# Patient Record
Sex: Female | Born: 1950 | Race: White | Hispanic: No | Marital: Married | Smoking: Never smoker
Health system: Southern US, Community
[De-identification: ages and names within clinical notes are randomized; demographics above are authoritative.]

## PROBLEM LIST (undated history)

## (undated) DIAGNOSIS — I10 Essential (primary) hypertension: Secondary | ICD-10-CM

## (undated) DIAGNOSIS — I2510 Atherosclerotic heart disease of native coronary artery without angina pectoris: Secondary | ICD-10-CM

## (undated) DIAGNOSIS — E782 Mixed hyperlipidemia: Secondary | ICD-10-CM

## (undated) DIAGNOSIS — J84112 Idiopathic pulmonary fibrosis: Secondary | ICD-10-CM

## (undated) DIAGNOSIS — M797 Fibromyalgia: Secondary | ICD-10-CM

## (undated) DIAGNOSIS — E119 Type 2 diabetes mellitus without complications: Secondary | ICD-10-CM

## (undated) DIAGNOSIS — M810 Age-related osteoporosis without current pathological fracture: Secondary | ICD-10-CM

## (undated) DIAGNOSIS — E7800 Pure hypercholesterolemia, unspecified: Secondary | ICD-10-CM

## (undated) HISTORY — DX: Essential (primary) hypertension: I10

## (undated) HISTORY — DX: Type 2 diabetes mellitus without complications: E11.9

## (undated) HISTORY — PX: CATARACT EXTRACTION: SUR2

## (undated) HISTORY — DX: Age-related osteoporosis without current pathological fracture: M81.0

## (undated) HISTORY — DX: Mixed hyperlipidemia: E78.2

## (undated) HISTORY — PX: ABDOMINAL HYSTERECTOMY: SHX81

## (undated) HISTORY — DX: Pure hypercholesterolemia, unspecified: E78.00

## (undated) HISTORY — DX: Fibromyalgia: M79.7

## (undated) HISTORY — DX: Atherosclerotic heart disease of native coronary artery without angina pectoris: I25.10

## (undated) HISTORY — DX: Idiopathic pulmonary fibrosis: J84.112

---

## 1999-04-04 ENCOUNTER — Encounter: Payer: Self-pay | Admitting: Endocrinology

## 2004-05-30 ENCOUNTER — Ambulatory Visit: Payer: Self-pay | Admitting: Cardiology

## 2004-06-07 ENCOUNTER — Ambulatory Visit: Payer: Self-pay | Admitting: Cardiology

## 2005-03-20 HISTORY — PX: ABDOMINAL HYSTERECTOMY: SHX81

## 2007-06-14 ENCOUNTER — Ambulatory Visit: Payer: Self-pay | Admitting: Cardiology

## 2007-12-18 ENCOUNTER — Encounter: Payer: Self-pay | Admitting: Endocrinology

## 2007-12-25 ENCOUNTER — Encounter: Payer: Self-pay | Admitting: Endocrinology

## 2008-01-03 ENCOUNTER — Ambulatory Visit: Payer: Self-pay | Admitting: Endocrinology

## 2008-01-03 DIAGNOSIS — E785 Hyperlipidemia, unspecified: Secondary | ICD-10-CM | POA: Insufficient documentation

## 2008-01-03 DIAGNOSIS — M1380 Other specified arthritis, unspecified site: Secondary | ICD-10-CM | POA: Insufficient documentation

## 2008-01-03 DIAGNOSIS — F329 Major depressive disorder, single episode, unspecified: Secondary | ICD-10-CM | POA: Insufficient documentation

## 2008-01-03 DIAGNOSIS — G43909 Migraine, unspecified, not intractable, without status migrainosus: Secondary | ICD-10-CM | POA: Insufficient documentation

## 2008-01-03 DIAGNOSIS — L659 Nonscarring hair loss, unspecified: Secondary | ICD-10-CM | POA: Insufficient documentation

## 2008-01-03 DIAGNOSIS — E041 Nontoxic single thyroid nodule: Secondary | ICD-10-CM | POA: Insufficient documentation

## 2008-01-03 DIAGNOSIS — J309 Allergic rhinitis, unspecified: Secondary | ICD-10-CM | POA: Insufficient documentation

## 2008-01-03 DIAGNOSIS — F3289 Other specified depressive episodes: Secondary | ICD-10-CM | POA: Insufficient documentation

## 2008-01-17 ENCOUNTER — Encounter: Payer: Self-pay | Admitting: Endocrinology

## 2008-01-23 ENCOUNTER — Telehealth: Payer: Self-pay | Admitting: Endocrinology

## 2008-03-19 ENCOUNTER — Encounter: Payer: Self-pay | Admitting: Endocrinology

## 2008-09-03 ENCOUNTER — Encounter: Payer: Self-pay | Admitting: Endocrinology

## 2008-09-17 ENCOUNTER — Ambulatory Visit: Payer: Self-pay | Admitting: Endocrinology

## 2008-09-17 DIAGNOSIS — E236 Other disorders of pituitary gland: Secondary | ICD-10-CM | POA: Insufficient documentation

## 2008-09-17 LAB — CONVERTED CEMR LAB
FSH: 61 milliintl units/mL
TSH: 0.55 microintl units/mL (ref 0.35–5.50)

## 2009-10-07 ENCOUNTER — Ambulatory Visit: Payer: Self-pay | Admitting: Cardiology

## 2014-04-30 DIAGNOSIS — Z6829 Body mass index (BMI) 29.0-29.9, adult: Secondary | ICD-10-CM | POA: Diagnosis not present

## 2014-04-30 DIAGNOSIS — M8588 Other specified disorders of bone density and structure, other site: Secondary | ICD-10-CM | POA: Diagnosis not present

## 2014-04-30 DIAGNOSIS — Z124 Encounter for screening for malignant neoplasm of cervix: Secondary | ICD-10-CM | POA: Diagnosis not present

## 2014-04-30 DIAGNOSIS — Z01419 Encounter for gynecological examination (general) (routine) without abnormal findings: Secondary | ICD-10-CM | POA: Diagnosis not present

## 2014-04-30 DIAGNOSIS — N958 Other specified menopausal and perimenopausal disorders: Secondary | ICD-10-CM | POA: Diagnosis not present

## 2014-04-30 DIAGNOSIS — Z1231 Encounter for screening mammogram for malignant neoplasm of breast: Secondary | ICD-10-CM | POA: Diagnosis not present

## 2014-05-20 DIAGNOSIS — M542 Cervicalgia: Secondary | ICD-10-CM | POA: Diagnosis not present

## 2014-05-20 DIAGNOSIS — M797 Fibromyalgia: Secondary | ICD-10-CM | POA: Diagnosis not present

## 2014-05-20 DIAGNOSIS — R5383 Other fatigue: Secondary | ICD-10-CM | POA: Diagnosis not present

## 2014-05-20 DIAGNOSIS — G4701 Insomnia due to medical condition: Secondary | ICD-10-CM | POA: Diagnosis not present

## 2014-06-11 DIAGNOSIS — E780 Pure hypercholesterolemia: Secondary | ICD-10-CM | POA: Diagnosis not present

## 2014-06-11 DIAGNOSIS — G5761 Lesion of plantar nerve, right lower limb: Secondary | ICD-10-CM | POA: Diagnosis not present

## 2014-06-11 DIAGNOSIS — Z1389 Encounter for screening for other disorder: Secondary | ICD-10-CM | POA: Diagnosis not present

## 2014-06-11 DIAGNOSIS — M797 Fibromyalgia: Secondary | ICD-10-CM | POA: Diagnosis not present

## 2014-09-30 DIAGNOSIS — D3130 Benign neoplasm of unspecified choroid: Secondary | ICD-10-CM | POA: Diagnosis not present

## 2014-10-19 DIAGNOSIS — H40013 Open angle with borderline findings, low risk, bilateral: Secondary | ICD-10-CM | POA: Diagnosis not present

## 2014-10-19 DIAGNOSIS — H2511 Age-related nuclear cataract, right eye: Secondary | ICD-10-CM | POA: Diagnosis not present

## 2014-10-19 DIAGNOSIS — H2512 Age-related nuclear cataract, left eye: Secondary | ICD-10-CM | POA: Diagnosis not present

## 2014-10-19 DIAGNOSIS — H25011 Cortical age-related cataract, right eye: Secondary | ICD-10-CM | POA: Diagnosis not present

## 2014-10-19 DIAGNOSIS — H25012 Cortical age-related cataract, left eye: Secondary | ICD-10-CM | POA: Diagnosis not present

## 2014-10-22 DIAGNOSIS — Z79899 Other long term (current) drug therapy: Secondary | ICD-10-CM | POA: Diagnosis not present

## 2014-10-27 DIAGNOSIS — H2511 Age-related nuclear cataract, right eye: Secondary | ICD-10-CM | POA: Diagnosis not present

## 2014-11-04 DIAGNOSIS — Z5181 Encounter for therapeutic drug level monitoring: Secondary | ICD-10-CM | POA: Diagnosis not present

## 2014-11-04 DIAGNOSIS — E780 Pure hypercholesterolemia: Secondary | ICD-10-CM | POA: Diagnosis not present

## 2014-11-04 DIAGNOSIS — R739 Hyperglycemia, unspecified: Secondary | ICD-10-CM | POA: Diagnosis not present

## 2014-11-12 DIAGNOSIS — M4000 Postural kyphosis, site unspecified: Secondary | ICD-10-CM | POA: Diagnosis not present

## 2014-11-12 DIAGNOSIS — Z23 Encounter for immunization: Secondary | ICD-10-CM | POA: Diagnosis not present

## 2014-11-12 DIAGNOSIS — Z0001 Encounter for general adult medical examination with abnormal findings: Secondary | ICD-10-CM | POA: Diagnosis not present

## 2014-11-13 DIAGNOSIS — Z036 Encounter for observation for suspected toxic effect from ingested substance ruled out: Secondary | ICD-10-CM | POA: Diagnosis not present

## 2014-11-13 DIAGNOSIS — M797 Fibromyalgia: Secondary | ICD-10-CM | POA: Diagnosis not present

## 2014-11-13 DIAGNOSIS — G4700 Insomnia, unspecified: Secondary | ICD-10-CM | POA: Diagnosis not present

## 2014-11-13 DIAGNOSIS — M40204 Unspecified kyphosis, thoracic region: Secondary | ICD-10-CM | POA: Diagnosis not present

## 2014-11-13 DIAGNOSIS — M5137 Other intervertebral disc degeneration, lumbosacral region: Secondary | ICD-10-CM | POA: Diagnosis not present

## 2014-11-18 DIAGNOSIS — H2512 Age-related nuclear cataract, left eye: Secondary | ICD-10-CM | POA: Diagnosis not present

## 2014-11-18 DIAGNOSIS — H25012 Cortical age-related cataract, left eye: Secondary | ICD-10-CM | POA: Diagnosis not present

## 2014-11-24 DIAGNOSIS — H2513 Age-related nuclear cataract, bilateral: Secondary | ICD-10-CM | POA: Diagnosis not present

## 2014-11-24 DIAGNOSIS — H2512 Age-related nuclear cataract, left eye: Secondary | ICD-10-CM | POA: Diagnosis not present

## 2014-12-16 DIAGNOSIS — Z961 Presence of intraocular lens: Secondary | ICD-10-CM | POA: Diagnosis not present

## 2014-12-26 DIAGNOSIS — R6889 Other general symptoms and signs: Secondary | ICD-10-CM | POA: Diagnosis not present

## 2015-01-01 DIAGNOSIS — R079 Chest pain, unspecified: Secondary | ICD-10-CM | POA: Diagnosis not present

## 2015-01-05 DIAGNOSIS — R079 Chest pain, unspecified: Secondary | ICD-10-CM | POA: Diagnosis not present

## 2015-01-05 DIAGNOSIS — R9431 Abnormal electrocardiogram [ECG] [EKG]: Secondary | ICD-10-CM | POA: Diagnosis not present

## 2015-03-23 DIAGNOSIS — J111 Influenza due to unidentified influenza virus with other respiratory manifestations: Secondary | ICD-10-CM | POA: Diagnosis not present

## 2015-05-17 DIAGNOSIS — Z79899 Other long term (current) drug therapy: Secondary | ICD-10-CM | POA: Diagnosis not present

## 2015-05-17 DIAGNOSIS — M542 Cervicalgia: Secondary | ICD-10-CM | POA: Diagnosis not present

## 2015-05-17 DIAGNOSIS — M5137 Other intervertebral disc degeneration, lumbosacral region: Secondary | ICD-10-CM | POA: Diagnosis not present

## 2015-05-17 DIAGNOSIS — G4709 Other insomnia: Secondary | ICD-10-CM | POA: Diagnosis not present

## 2015-05-20 DIAGNOSIS — Z961 Presence of intraocular lens: Secondary | ICD-10-CM | POA: Diagnosis not present

## 2015-09-01 DIAGNOSIS — H40013 Open angle with borderline findings, low risk, bilateral: Secondary | ICD-10-CM | POA: Diagnosis not present

## 2015-09-01 DIAGNOSIS — Z961 Presence of intraocular lens: Secondary | ICD-10-CM | POA: Diagnosis not present

## 2015-09-01 DIAGNOSIS — D3131 Benign neoplasm of right choroid: Secondary | ICD-10-CM | POA: Diagnosis not present

## 2015-09-01 DIAGNOSIS — H524 Presbyopia: Secondary | ICD-10-CM | POA: Diagnosis not present

## 2015-11-12 DIAGNOSIS — Z5181 Encounter for therapeutic drug level monitoring: Secondary | ICD-10-CM | POA: Diagnosis not present

## 2015-11-19 DIAGNOSIS — M1711 Unilateral primary osteoarthritis, right knee: Secondary | ICD-10-CM | POA: Diagnosis not present

## 2015-11-19 DIAGNOSIS — M79672 Pain in left foot: Secondary | ICD-10-CM | POA: Diagnosis not present

## 2015-11-19 DIAGNOSIS — G4709 Other insomnia: Secondary | ICD-10-CM | POA: Diagnosis not present

## 2015-11-19 DIAGNOSIS — M797 Fibromyalgia: Secondary | ICD-10-CM | POA: Diagnosis not present

## 2015-11-19 DIAGNOSIS — M1712 Unilateral primary osteoarthritis, left knee: Secondary | ICD-10-CM | POA: Diagnosis not present

## 2015-11-19 DIAGNOSIS — M79671 Pain in right foot: Secondary | ICD-10-CM | POA: Diagnosis not present

## 2015-12-10 DIAGNOSIS — Z5181 Encounter for therapeutic drug level monitoring: Secondary | ICD-10-CM | POA: Diagnosis not present

## 2015-12-10 DIAGNOSIS — R739 Hyperglycemia, unspecified: Secondary | ICD-10-CM | POA: Diagnosis not present

## 2015-12-10 DIAGNOSIS — E7800 Pure hypercholesterolemia, unspecified: Secondary | ICD-10-CM | POA: Diagnosis not present

## 2015-12-10 DIAGNOSIS — M797 Fibromyalgia: Secondary | ICD-10-CM | POA: Diagnosis not present

## 2015-12-16 DIAGNOSIS — Z23 Encounter for immunization: Secondary | ICD-10-CM | POA: Diagnosis not present

## 2015-12-16 DIAGNOSIS — Z0000 Encounter for general adult medical examination without abnormal findings: Secondary | ICD-10-CM | POA: Diagnosis not present

## 2015-12-16 DIAGNOSIS — Z1389 Encounter for screening for other disorder: Secondary | ICD-10-CM | POA: Diagnosis not present

## 2016-01-21 ENCOUNTER — Other Ambulatory Visit: Payer: Self-pay | Admitting: Rheumatology

## 2016-01-21 ENCOUNTER — Telehealth: Payer: Self-pay | Admitting: Radiology

## 2016-01-21 NOTE — Telephone Encounter (Signed)
Patient states she will call back Monday 01/24/16 to schedule March appt.

## 2016-01-21 NOTE — Telephone Encounter (Signed)
Last visit 11/19/15 Next visit not scheduled, have sent message for scheduling  Ok to refill per Dr Estanislado Pandy

## 2016-01-21 NOTE — Telephone Encounter (Signed)
Patient needs follow up appt with Dr Estanislado Pandy pls call to make appt. March

## 2016-02-14 ENCOUNTER — Other Ambulatory Visit: Payer: Self-pay | Admitting: Rheumatology

## 2016-02-14 MED ORDER — TRAMADOL HCL 50 MG PO TABS: 50.0000 mg | ORAL_TABLET | Freq: Three times a day (TID) | ORAL | 2 refills | Status: DC | PRN

## 2016-02-14 NOTE — Telephone Encounter (Signed)
11/19/15 last visit Next visit 06/01/16 UDS 11/12/15 Ok to refill Tramadol ?

## 2016-02-14 NOTE — Telephone Encounter (Signed)
Patient is requesting refill of Tramadol be sent to [facility] today because she is completely out.

## 2016-02-15 ENCOUNTER — Telehealth: Payer: Self-pay | Admitting: Rheumatology

## 2016-02-15 NOTE — Telephone Encounter (Signed)
Patient called to check status of Tramadol refill. Patient is very upset since she is completely out

## 2016-02-15 NOTE — Telephone Encounter (Signed)
She requested it yesterday, I called in today

## 2016-05-15 ENCOUNTER — Telehealth: Payer: Self-pay | Admitting: Radiology

## 2016-05-15 NOTE — Telephone Encounter (Signed)
[employer] has sent [place] a safety notification regarding patient. She uses Tramadol from [place] and uses Savella from Dr Nadara Mustard. 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 I have called patient to advise her to decrease her use of tramadol, and to try to stop it if possible. She voiced understanding   To you FYI

## 2016-06-01 ENCOUNTER — Ambulatory Visit: Payer: Self-pay | Admitting: Rheumatology

## 2016-06-12 ENCOUNTER — Other Ambulatory Visit: Payer: Self-pay | Admitting: Obstetrics and Gynecology

## 2016-06-12 DIAGNOSIS — M8588 Other specified disorders of bone density and structure, other site: Secondary | ICD-10-CM | POA: Diagnosis not present

## 2016-06-12 DIAGNOSIS — Z01419 Encounter for gynecological examination (general) (routine) without abnormal findings: Secondary | ICD-10-CM | POA: Diagnosis not present

## 2016-06-12 DIAGNOSIS — N958 Other specified menopausal and perimenopausal disorders: Secondary | ICD-10-CM | POA: Diagnosis not present

## 2016-06-12 DIAGNOSIS — Z13820 Encounter for screening for osteoporosis: Secondary | ICD-10-CM | POA: Diagnosis not present

## 2016-06-12 DIAGNOSIS — N644 Mastodynia: Secondary | ICD-10-CM

## 2016-06-14 ENCOUNTER — Ambulatory Visit
Admission: RE | Admit: 2016-06-14 | Discharge: 2016-06-14 | Disposition: A | Discharge status: Home / Self Care | Payer: Medicare Other | Source: Ambulatory Visit | Attending: Obstetrics and Gynecology | Admitting: Obstetrics and Gynecology

## 2016-06-14 ENCOUNTER — Encounter: Payer: Self-pay | Admitting: Radiology

## 2016-06-14 DIAGNOSIS — N644 Mastodynia: Secondary | ICD-10-CM

## 2016-06-23 ENCOUNTER — Ambulatory Visit: Payer: Self-pay | Admitting: Rheumatology

## 2016-06-26 DIAGNOSIS — M79672 Pain in left foot: Secondary | ICD-10-CM

## 2016-06-26 DIAGNOSIS — M25562 Pain in left knee: Secondary | ICD-10-CM

## 2016-06-26 DIAGNOSIS — M19071 Primary osteoarthritis, right ankle and foot: Secondary | ICD-10-CM | POA: Insufficient documentation

## 2016-06-26 DIAGNOSIS — M5134 Other intervertebral disc degeneration, thoracic region: Secondary | ICD-10-CM | POA: Insufficient documentation

## 2016-06-26 DIAGNOSIS — M79671 Pain in right foot: Secondary | ICD-10-CM | POA: Insufficient documentation

## 2016-06-26 DIAGNOSIS — G4739 Other sleep apnea: Secondary | ICD-10-CM | POA: Insufficient documentation

## 2016-06-26 DIAGNOSIS — Z8739 Personal history of other diseases of the musculoskeletal system and connective tissue: Secondary | ICD-10-CM | POA: Insufficient documentation

## 2016-06-26 DIAGNOSIS — R5383 Other fatigue: Secondary | ICD-10-CM | POA: Insufficient documentation

## 2016-06-26 DIAGNOSIS — M170 Bilateral primary osteoarthritis of knee: Secondary | ICD-10-CM | POA: Insufficient documentation

## 2016-06-26 DIAGNOSIS — M5030 Other cervical disc degeneration, unspecified cervical region: Secondary | ICD-10-CM | POA: Insufficient documentation

## 2016-06-26 DIAGNOSIS — M25561 Pain in right knee: Secondary | ICD-10-CM | POA: Insufficient documentation

## 2016-06-26 DIAGNOSIS — Z8639 Personal history of other endocrine, nutritional and metabolic disease: Secondary | ICD-10-CM | POA: Insufficient documentation

## 2016-06-26 DIAGNOSIS — Z8679 Personal history of other diseases of the circulatory system: Secondary | ICD-10-CM | POA: Insufficient documentation

## 2016-06-26 DIAGNOSIS — M797 Fibromyalgia: Secondary | ICD-10-CM | POA: Insufficient documentation

## 2016-06-26 DIAGNOSIS — M19072 Primary osteoarthritis, left ankle and foot: Secondary | ICD-10-CM

## 2016-06-26 DIAGNOSIS — M5136 Other intervertebral disc degeneration, lumbar region: Secondary | ICD-10-CM | POA: Insufficient documentation

## 2016-06-26 DIAGNOSIS — G4709 Other insomnia: Secondary | ICD-10-CM | POA: Insufficient documentation

## 2016-06-26 NOTE — Progress Notes (Signed)
Office Visit Note  Patient: Nichole Reyes             Date of Birth: 03-Jan-1951           MRN: [id number]             PCP: Rory Percy, MD Referring: Renato Shin, MD Visit Date: 06/28/2016 Occupation: @GUAROCC @    Subjective:  Follow-up   History of Present Illness: Nichole Reyes is a 66 y.o. female   She rates her FMS as a "4-5 "on a scale of 0-10.  Patient also is having trapezius muscle pain and rates her discomfort as a 9 on a scale of 0-10.  Currently, patient's bilateral knees are doing well.  Patient also has worked very hard to decrease the tramadol usage. She is making an effort to discontinue her tramadol over time. Currently she does need a refill and I'm happy to do so. I've asked the patient to try to take tramadol only at nighttime on days that she cannot tolerate the pain. We discussed the addictive nature of these type of drugs and how to minimize risk of addiction  Patient's labs are past 2 and we will order labs today for the patient (CBC with differential, CMP with GFR, vitamin D 25 OH)  Also, patient does not have a bedtime muscle relaxer and I have offered her Zanaflex to use. She is agreeable. She will do 4 mg daily at bedtime when necessary.  Patient brought in her bone density as requested to discuss in detail. Future bone densities, patient wants [place] to start ordering and treat accordingly.  Activities of Daily Living:  Patient reports morning stiffness for 15 minutes.   Patient Reports nocturnal pain.  Difficulty dressing/grooming: Reports Difficulty climbing stairs: Denies Difficulty getting out of chair: Denies Difficulty using hands for taps, buttons, cutlery, and/or writing: Denies   Review of Systems  Constitutional: Positive for fatigue.  HENT: Negative for mouth sores and mouth dryness.   Eyes: Negative for dryness.  Respiratory: Negative for shortness of breath.   Gastrointestinal: Negative for constipation and diarrhea.   Musculoskeletal: Positive for myalgias and myalgias.  Skin: Negative for sensitivity to sunlight.  Psychiatric/Behavioral: Positive for sleep disturbance. Negative for decreased concentration.    PMFS History:  Patient Active Problem List   Diagnosis Date Noted  . Fibromyalgia syndrome 06/26/2016  . Other fatigue 06/26/2016  . Other insomnia 06/26/2016  . Arthralgia of both knees 06/26/2016  . Pain in both feet 06/26/2016  . Primary osteoarthritis of both feet 06/26/2016  . Primary osteoarthritis of both knees 06/26/2016  . DDD (degenerative disc disease), cervical 06/26/2016  . 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 DDD (degenerative disc disease), lumbar 06/26/2016  . History of hypertension 06/26/2016  . History of high cholesterol 06/26/2016  . Other sleep apnea 06/26/2016  . History of osteopenia 06/26/2016  . 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 THYROID NODULE, LEFT 01/03/2008  . HYPERLIPIDEMIA 01/03/2008  . DEPRESSION 01/03/2008  . MIGRAINE HEADACHE 01/03/2008  . ALLERGIC RHINITIS 01/03/2008  . ALOPECIA 01/03/2008  . ALLERGIC ARTHRITIS SITE UNSPECIFIED 01/03/2008    History reviewed. No pertinent past medical history.  History reviewed. No pertinent family history. History reviewed. No pertinent surgical history. Social History   Social History Narrative  . No narrative on file     Objective: Vital Signs: BP (!) 151/81   Pulse 79   Resp 13   Ht 5\' 6"  (1.676 m)  Wt 173 lb (78.5 kg)   BMI 27.92 kg/m    Physical Exam  Constitutional: She is oriented to person, place, and time. She appears well-developed and well-nourished.  HENT:  Head: Normocephalic and atraumatic.  Eyes: EOM are normal. Pupils are equal, round, and reactive to light.  Cardiovascular: Normal rate, regular rhythm and normal heart sounds.  Exam reveals no gallop and no friction rub.   No murmur heard. Pulmonary/Chest: Effort normal and breath sounds normal.  She has no wheezes. She has no rales.  Abdominal: Soft. Bowel sounds are normal. She exhibits no distension. There is no tenderness. There is no guarding. No hernia.  Musculoskeletal: Normal range of motion. She exhibits no edema, tenderness or deformity.  Lymphadenopathy:    She has no cervical adenopathy.  Neurological: She is alert and oriented to person, place, and time. Coordination normal.  Skin: Skin is warm and dry. Capillary refill takes less than 2 seconds. No rash noted.  Psychiatric: She has a normal mood and affect. Her behavior is normal.  Nursing note and vitals reviewed.    Musculoskeletal Exam:  Full range of motion of all joints Grip strength is equal and strong bilaterally Fiber myalgia tender points are 18 out of 18 positive  CDAI Exam: CDAI Homunculus Exam:   Joint Counts:  CDAI Tender Joint count: 0 CDAI Swollen Joint count: 0  Global Assessments:  Patient Global Assessment: 5 Provider Global Assessment: 5  CDAI Calculated Score: 10    Investigation: Findings:   Labs from October 23, 2015, show urine drug screen is negative.  Bilateral knee joints, 2 views, show moderate medial compartment narrowing of bilateral knees as well as moderate patellofemoral joint space narrowing.  No CPPD.  Bilateral feet, 2 views, show DIP and PIP joint space narrowing and ITP narrowing.  No erosions noted.  Bilateral 1st MTP with very mild narrowing.  Annual drug screen, which was already done November 15, 2015.  She will be due for another one in 1 year.   DEXA will be done next month and she will bring in the old one as well as the new one  No visits with results within 6 Month(s) from this visit.  Latest known visit with results is:  [facility] Conversion Encounter on 09/17/2008  Component Date Value Ref Range Status  . TSH 09/17/2008 0.55  0.35 - 5.50 microintl units/mL Final  . FSH 09/17/2008 61.0  milliintl units/mL Final  . LH 09/17/2008 36.10  milliintl units/mL Final       Imaging: Mm Diag Breast Tomo Bilateral  Result Date: 06/14/2016 CLINICAL DATA:  Patient describes diffuse intermittent pain within the outer right breast and subareolar pain. EXAM: 2D DIGITAL DIAGNOSTIC BILATERAL MAMMOGRAM WITH CAD AND ADJUNCT TOMO COMPARISON:  Previous exam(s). ACR Breast Density Category b: There are scattered areas of fibroglandular density. FINDINGS: There are no dominant masses, suspicious calcifications or secondary signs of malignancy within either breast. Specifically, there is no mammographic abnormality within the outer or subareolar right breast corresponding to the areas of clinical concern. Mammographic images were processed with CAD. IMPRESSION: No evidence of malignancy within either breast. RECOMMENDATION: Screening mammogram in one year.(Code:SM-B-01Y) Benign causes of breast pain, and possible remedies, were discussed with the patient. Patient was encouraged to follow-up with referring physician if pain became localized and persistent or if a palpable lump/mass developed. I have discussed the findings and recommendations with the patient. Results were also provided in writing at the conclusion of the visit. If applicable, a  reminder letter will be sent to the patient regarding the next appointment. BI-RADS CATEGORY  1: Negative. Electronically Signed   By: Franki Cabot M.D.   On: 06/14/2016 12:23    Speciality Comments: No specialty comments available.    Procedures:  Trigger Point Inj Date/Time: 06/28/2016 4:16 PM Performed by: Eliezer Lofts Authorized by: Eliezer Lofts   Consent Given by:  Patient Site marked: the procedure site was marked   Timeout: prior to procedure the correct patient, procedure, and site was verified   Indications:  Muscle spasm and pain Total # of Trigger Points:  2 Location: neck   Needle Size:  27 G Approach:  Dorsal Medications #1:  0.3 mL lidocaine 1 %; 10 mg triamcinolone acetonide 40 MG/ML Medications #2:  0.3 mL  lidocaine 1 %; 10 mg triamcinolone acetonide 40 MG/ML Patient tolerance:  Patient tolerated the procedure well with no immediate complications Comments: Patient's pain to bilateral trapezius muscles were 9 on a scale of 0-10 prior to the cortisone injection.  After the cortisone injection was given, patient's pain went down to 3 and then down to 0.   Allergies: Pseudoephedrine and Sulfonamide derivatives   Assessment / Plan:     Visit Diagnoses: Fibromyalgia syndrome  Other fatigue - Plan: CBC with Differential/Platelet, COMPLETE METABOLIC PANEL WITH GFR, VITAMIN D 25 Hydroxy (Vit-D Deficiency, Fractures)  Other insomnia  Arthralgia of both knees  Pain in both feet  Primary osteoarthritis of both feet  Primary osteoarthritis of both knees  DDD (degenerative disc disease), cervical  DDD (degenerative disc disease), thoracic  DDD (degenerative disc disease), lumbar  History of hypertension  History of high cholesterol  Other sleep apnea  History of osteopenia - Plan: VITAMIN D 25 Hydroxy (Vit-D Deficiency, Fractures)   Plan: #1: Fiber myalgia syndrome. She rates her discomfort as a 4-5 on a scale of 0-10.  #2: Patient rates her trapezius muscle spasm as a 9 and wants an injection.  Patient also is having trapezius muscle pain and rates her discomfort as a 9 on a scale of 0-10. (Before the injection pain shouldn't pain was rated 9 After the cortisone injection, patient's pain went from a 9 to --> 3--> 0 TRAPEZIUS PAIN)  #3: Patient had bone density done through her OB/GYN's office. DEXA was done 06/12/2016. T score was -1.5 of the AP spine. T score was -0.7 of the left femoral neck. T score was -0.3 of the right femoral neck. Patient will be due for repeat bone density April 2020. In the meanwhile, patient was directed to do weightbearing exercise, continue vitamin D supplement, take calcium as discussed in the office.  #3: Return to clinic in 6 months   #4: no  change in treatment at this time.  Orders: Orders Placed This Encounter  Procedures  . Trigger Point Injection  . CBC with Differential/Platelet  . COMPLETE METABOLIC PANEL WITH GFR  . VITAMIN D 25 Hydroxy (Vit-D Deficiency, Fractures)   Meds ordered this encounter  Medications  . tiZANidine (ZANAFLEX) 4 MG tablet    Sig: Take 1 tablet (4 mg total) by mouth at bedtime.    Dispense:  90 tablet    Refill:  1    Order Specific Question:   Supervising Provider    Answer:   Bo Merino [[id number]]  . traMADol (ULTRAM) 50 MG tablet    Sig: Take 1 tablet (50 mg total) by mouth 2 (two) times daily.    Dispense:  60 tablet  Refill:  1    Order Specific Question:   Supervising Provider    Answer:   Bo Merino [phone]    Face-to-face time spent with patient was 30 minutes. 50% of time was spent in counseling and coordination of care.  Follow-Up Instructions: Return in about 6 months (around 12/28/2016) for FMS, FATIGUE,INSOMNIA, BIL TRAP MUSCLE SPASM,.   Eliezer Lofts, PA-C  Note - This record has been created using [employer].  Chart creation errors have been sought, but may not always  have been located. Such creation errors do not reflect on  the standard of medical care.

## 2016-06-27 DIAGNOSIS — M797 Fibromyalgia: Secondary | ICD-10-CM | POA: Diagnosis not present

## 2016-06-27 DIAGNOSIS — E559 Vitamin D deficiency, unspecified: Secondary | ICD-10-CM | POA: Diagnosis not present

## 2016-06-27 DIAGNOSIS — M4000 Postural kyphosis, site unspecified: Secondary | ICD-10-CM | POA: Diagnosis not present

## 2016-06-27 DIAGNOSIS — E7800 Pure hypercholesterolemia, unspecified: Secondary | ICD-10-CM | POA: Diagnosis not present

## 2016-06-27 DIAGNOSIS — N951 Menopausal and female climacteric states: Secondary | ICD-10-CM | POA: Diagnosis not present

## 2016-06-28 ENCOUNTER — Encounter: Payer: Self-pay | Admitting: Rheumatology

## 2016-06-28 ENCOUNTER — Ambulatory Visit (INDEPENDENT_AMBULATORY_CARE_PROVIDER_SITE_OTHER): Payer: Medicare Other | Admitting: Rheumatology

## 2016-06-28 VITALS — BP 151/81 | HR 79 | Resp 13 | Ht 66.0 in | Wt 173.0 lb

## 2016-06-28 DIAGNOSIS — M79671 Pain in right foot: Secondary | ICD-10-CM

## 2016-06-28 DIAGNOSIS — M5030 Other cervical disc degeneration, unspecified cervical region: Secondary | ICD-10-CM | POA: Diagnosis not present

## 2016-06-28 DIAGNOSIS — M19071 Primary osteoarthritis, right ankle and foot: Secondary | ICD-10-CM

## 2016-06-28 DIAGNOSIS — M170 Bilateral primary osteoarthritis of knee: Secondary | ICD-10-CM | POA: Diagnosis not present

## 2016-06-28 DIAGNOSIS — M5134 Other intervertebral disc degeneration, thoracic region: Secondary | ICD-10-CM | POA: Diagnosis not present

## 2016-06-28 DIAGNOSIS — M25562 Pain in left knee: Secondary | ICD-10-CM

## 2016-06-28 DIAGNOSIS — M797 Fibromyalgia: Secondary | ICD-10-CM | POA: Diagnosis not present

## 2016-06-28 DIAGNOSIS — G4709 Other insomnia: Secondary | ICD-10-CM

## 2016-06-28 DIAGNOSIS — M51369 Other intervertebral disc degeneration, lumbar region without mention of lumbar back pain or lower extremity pain: Secondary | ICD-10-CM

## 2016-06-28 DIAGNOSIS — Z8639 Personal history of other endocrine, nutritional and metabolic disease: Secondary | ICD-10-CM | POA: Diagnosis not present

## 2016-06-28 DIAGNOSIS — Z8739 Personal history of other diseases of the musculoskeletal system and connective tissue: Secondary | ICD-10-CM | POA: Diagnosis not present

## 2016-06-28 DIAGNOSIS — M62838 Other muscle spasm: Secondary | ICD-10-CM

## 2016-06-28 DIAGNOSIS — M5136 Other intervertebral disc degeneration, lumbar region: Secondary | ICD-10-CM | POA: Diagnosis not present

## 2016-06-28 DIAGNOSIS — R5383 Other fatigue: Secondary | ICD-10-CM

## 2016-06-28 DIAGNOSIS — M25561 Pain in right knee: Secondary | ICD-10-CM

## 2016-06-28 DIAGNOSIS — G4739 Other sleep apnea: Secondary | ICD-10-CM

## 2016-06-28 DIAGNOSIS — M79672 Pain in left foot: Secondary | ICD-10-CM

## 2016-06-28 DIAGNOSIS — Z8679 Personal history of other diseases of the circulatory system: Secondary | ICD-10-CM | POA: Diagnosis not present

## 2016-06-28 DIAGNOSIS — M19072 Primary osteoarthritis, left ankle and foot: Secondary | ICD-10-CM

## 2016-06-28 LAB — CBC WITH DIFFERENTIAL/PLATELET
BASOS ABS: 97 {cells}/uL (ref 0–200)
Basophils Relative: 1 %
EOS ABS: 194 {cells}/uL (ref 15–500)
Eosinophils Relative: 2 %
HEMATOCRIT: 42.2 % (ref 35.0–45.0)
HEMOGLOBIN: 14.2 g/dL (ref 11.7–15.5)
LYMPHS ABS: 2910 {cells}/uL (ref 850–3900)
LYMPHS PCT: 30 %
MCH: 32.1 pg (ref 27.0–33.0)
MCHC: 33.6 g/dL (ref 32.0–36.0)
MCV: 95.3 fL (ref 80.0–100.0)
MONO ABS: 970 {cells}/uL — AB (ref 200–950)
MPV: 9.5 fL (ref 7.5–12.5)
Monocytes Relative: 10 %
NEUTROS PCT: 57 %
Neutro Abs: 5529 cells/uL (ref 1500–7800)
Platelets: 436 10*3/uL — ABNORMAL HIGH (ref 140–400)
RBC: 4.43 MIL/uL (ref 3.80–5.10)
RDW: 13.1 % (ref 11.0–15.0)
WBC: 9.7 10*3/uL (ref 3.8–10.8)

## 2016-06-28 LAB — COMPLETE METABOLIC PANEL WITH GFR
ALBUMIN: 4.1 g/dL (ref 3.6–5.1)
ALT: 41 U/L — ABNORMAL HIGH (ref 6–29)
AST: 35 U/L (ref 10–35)
Alkaline Phosphatase: 51 U/L (ref 33–130)
BILIRUBIN TOTAL: 0.4 mg/dL (ref 0.2–1.2)
BUN: 15 mg/dL (ref 7–25)
CALCIUM: 10.2 mg/dL (ref 8.6–10.4)
CHLORIDE: 104 mmol/L (ref 98–110)
CO2: 26 mmol/L (ref 20–31)
Creat: 0.89 mg/dL (ref 0.50–0.99)
GFR, EST AFRICAN AMERICAN: 80 mL/min (ref >=60)
GFR, EST NON AFRICAN AMERICAN: 69 mL/min (ref >=60)
Glucose, Bld: 109 mg/dL — ABNORMAL HIGH (ref 65–99)
POTASSIUM: 4.9 mmol/L (ref 3.5–5.3)
Sodium: 139 mmol/L (ref 135–146)
Total Protein: 6.9 g/dL (ref 6.1–8.1)

## 2016-06-28 MED ORDER — TIZANIDINE HCL 4 MG PO TABS: 4.0000 mg | ORAL_TABLET | Freq: Every day | ORAL | 1 refills | Status: AC

## 2016-06-28 MED ORDER — LIDOCAINE HCL 1 % IJ SOLN
0.3000 mL | INTRAMUSCULAR | Status: AC | PRN
  Administered 2016-06-28: .3 mL

## 2016-06-28 MED ORDER — TRIAMCINOLONE ACETONIDE 40 MG/ML IJ SUSP
10.0000 mg | INTRAMUSCULAR | Status: AC | PRN
  Administered 2016-06-28: 10 mg via INTRAMUSCULAR

## 2016-06-28 MED ORDER — TRAMADOL HCL 50 MG PO TABS: 50.0000 mg | ORAL_TABLET | Freq: Two times a day (BID) | ORAL | 1 refills | Status: AC

## 2016-06-29 LAB — VITAMIN D 25 HYDROXY (VIT D DEFICIENCY, FRACTURES): Vit D, 25-Hydroxy: 42 ng/mL (ref 30–100)

## 2016-06-30 ENCOUNTER — Telehealth: Payer: Self-pay | Admitting: *Deleted

## 2016-06-30 NOTE — Telephone Encounter (Signed)
Received bone density scan results from [facility].  Reviewed by Mr. Carlyon Shadow. Shows Osteopenia. Treatment includes weightbearing exercises, Vitamin D and calcium. Repeat Dexa in 2020.  Attempted to contact the patient and left message for patient to call the office.

## 2016-06-30 NOTE — Telephone Encounter (Signed)
Patient is aware of results and recommendations.

## 2016-07-21 ENCOUNTER — Telehealth: Payer: Self-pay | Admitting: Rheumatology

## 2016-07-21 ENCOUNTER — Other Ambulatory Visit: Payer: Self-pay | Admitting: *Deleted

## 2016-07-21 NOTE — Telephone Encounter (Signed)
Lm for patient to return my call to schedule fu appt in October.

## 2016-07-21 NOTE — Telephone Encounter (Signed)
Error

## 2016-07-21 NOTE — Telephone Encounter (Signed)
-----   Message from Carole Binning, LPN sent at 03/20/5724  9:42 AM EDT ----- Regarding: Please schedule patient for follow up visit Please schedule patient for follow up visit. Patient is due October 2018. Thanks!

## 2016-10-02 DIAGNOSIS — B029 Zoster without complications: Secondary | ICD-10-CM | POA: Diagnosis not present

## 2016-10-02 DIAGNOSIS — B0239 Other herpes zoster eye disease: Secondary | ICD-10-CM | POA: Diagnosis not present

## 2016-10-06 DIAGNOSIS — B0239 Other herpes zoster eye disease: Secondary | ICD-10-CM | POA: Diagnosis not present

## 2016-10-13 DIAGNOSIS — B0230 Zoster ocular disease, unspecified: Secondary | ICD-10-CM | POA: Diagnosis not present

## 2016-10-24 DIAGNOSIS — B0239 Other herpes zoster eye disease: Secondary | ICD-10-CM | POA: Diagnosis not present

## 2016-12-04 DIAGNOSIS — M797 Fibromyalgia: Secondary | ICD-10-CM | POA: Diagnosis not present

## 2016-12-18 ENCOUNTER — Ambulatory Visit: Payer: Self-pay | Admitting: Rheumatology

## 2016-12-24 NOTE — Progress Notes (Deleted)
   Office Visit Note  Patient: Nichole Reyes             Date of Birth: 25-Mar-1950           MRN: [id number]             PCP: Rory Percy, MD Referring: Rory Percy, MD Visit Date: 01/02/2017 Occupation: @GUAROCC @    Subjective:  No chief complaint on file.   History of Present Illness: Nichole Reyes is a 66 y.o. female ***   Activities of Daily Living:  Patient reports morning stiffness for *** {minute/hour:19697}.   Patient {ACTIONS;DENIES/REPORTS:21021675::"Denies"} nocturnal pain.  Difficulty dressing/grooming: {ACTIONS;DENIES/REPORTS:21021675::"Denies"} Difficulty climbing stairs: {ACTIONS;DENIES/REPORTS:21021675::"Denies"} Difficulty getting out of chair: {ACTIONS;DENIES/REPORTS:21021675::"Denies"} Difficulty using hands for taps, buttons, cutlery, and/or writing: {ACTIONS;DENIES/REPORTS:21021675::"Denies"}   No Rheumatology ROS completed.   PMFS History:  Patient Active Problem List   Diagnosis Date Noted  . Fibromyalgia syndrome 06/26/2016  . Other fatigue 06/26/2016  . Other insomnia 06/26/2016  . Arthralgia of both knees 06/26/2016  . Pain in both feet 06/26/2016  . Primary osteoarthritis of both feet 06/26/2016  . Primary osteoarthritis of both knees 06/26/2016  . DDD (degenerative disc disease), cervical 06/26/2016  . 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 DDD (degenerative disc disease), lumbar 06/26/2016  . History of hypertension 06/26/2016  . History of high cholesterol 06/26/2016  . Other sleep apnea 06/26/2016  . History of osteopenia 06/26/2016  . 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 THYROID NODULE, LEFT 01/03/2008  . HYPERLIPIDEMIA 01/03/2008  . DEPRESSION 01/03/2008  . MIGRAINE HEADACHE 01/03/2008  . ALLERGIC RHINITIS 01/03/2008  . ALOPECIA 01/03/2008  . ALLERGIC ARTHRITIS SITE UNSPECIFIED 01/03/2008    No past medical history on file.  No family history on file. No past surgical history on  file. Social History   Social History Narrative  . No narrative on file     Objective: Vital Signs: There were no vitals taken for this visit.   Physical Exam   Musculoskeletal Exam: ***  CDAI Exam: No CDAI exam completed.    Investigation: No additional findings.   Imaging: No results found.  Speciality Comments: No specialty comments available.    Procedures:  No procedures performed Allergies: Pseudoephedrine and Sulfonamide derivatives   Assessment / Plan:     Visit Diagnoses: No diagnosis found.    Orders: No orders of the defined types were placed in this encounter.  No orders of the defined types were placed in this encounter.   Face-to-face time spent with patient was *** minutes. 50% of time was spent in counseling and coordination of care.  Follow-Up Instructions: No Follow-up on file.   Earnestine Mealing, NT  Note - This record has been created using [employer], commissioning.  Chart creation errors have been sought, but may not always  have been located. Such creation errors do not reflect on  the standard of medical care.

## 2017-01-01 DIAGNOSIS — J209 Acute bronchitis, unspecified: Secondary | ICD-10-CM | POA: Diagnosis not present

## 2017-01-01 DIAGNOSIS — E7800 Pure hypercholesterolemia, unspecified: Secondary | ICD-10-CM | POA: Diagnosis not present

## 2017-01-01 DIAGNOSIS — N951 Menopausal and female climacteric states: Secondary | ICD-10-CM | POA: Diagnosis not present

## 2017-01-01 DIAGNOSIS — Z5181 Encounter for therapeutic drug level monitoring: Secondary | ICD-10-CM | POA: Diagnosis not present

## 2017-01-01 DIAGNOSIS — R739 Hyperglycemia, unspecified: Secondary | ICD-10-CM | POA: Diagnosis not present

## 2017-01-02 ENCOUNTER — Ambulatory Visit: Payer: Self-pay | Admitting: Rheumatology

## 2017-01-03 DIAGNOSIS — M4000 Postural kyphosis, site unspecified: Secondary | ICD-10-CM | POA: Diagnosis not present

## 2017-01-03 DIAGNOSIS — E7800 Pure hypercholesterolemia, unspecified: Secondary | ICD-10-CM | POA: Diagnosis not present

## 2017-01-03 DIAGNOSIS — Z1389 Encounter for screening for other disorder: Secondary | ICD-10-CM | POA: Diagnosis not present

## 2017-01-03 DIAGNOSIS — M797 Fibromyalgia: Secondary | ICD-10-CM | POA: Diagnosis not present

## 2017-01-03 DIAGNOSIS — G4730 Sleep apnea, unspecified: Secondary | ICD-10-CM | POA: Diagnosis not present

## 2017-01-03 DIAGNOSIS — Z0001 Encounter for general adult medical examination with abnormal findings: Secondary | ICD-10-CM | POA: Diagnosis not present

## 2017-01-03 DIAGNOSIS — N951 Menopausal and female climacteric states: Secondary | ICD-10-CM | POA: Diagnosis not present

## 2017-01-15 DIAGNOSIS — L820 Inflamed seborrheic keratosis: Secondary | ICD-10-CM | POA: Diagnosis not present

## 2017-03-05 NOTE — Progress Notes (Signed)
Office Visit Note  Patient: Nichole Reyes             Date of Birth: 1950-09-12           MRN: [id number]             PCP: Rory Percy, MD Referring: Rory Percy, MD Visit Date: 03/15/2017 Occupation: @GUAROCC @    Subjective: Hand pain and swelling   History of Present Illness: Nichole Reyes is a 66 y.o. female with a history of fibromyalgia, osteoarthritis, and DDD.  Patient states she is having increased pain and swelling in bilateral hands, especially over MCP joints.  She states she occasionally has pain in her bilateral knees, left knee worse.  She denies any knee swelling, warmth, or injury.  She also has pain in her feet and ankles.  She takes either Aleve or Advil when the pain is severe.  She states she has been having more pain in her joints and muscle tenderness with the weather change.   She continues to have pain in her c-spine, thoracic, and lumbar spine. She has been trying to stay active and walk for exercise, which helps manage her fibromyalgia.  She states her insomnia has improved but her fatigue remains unchanged.  She takes Klonopin 0.25 mg at bedtime.     Activities of Daily Living:  Patient reports morning stiffness for 10 minutes.   Patient Reports nocturnal pain.  Difficulty dressing/grooming: Denies Difficulty climbing stairs: Denies Difficulty getting out of chair: Denies Difficulty using hands for taps, buttons, cutlery, and/or writing: Denies   Review of Systems  Constitutional: Negative for fatigue and weakness.  HENT: Negative for mouth sores, mouth dryness and nose dryness.   Eyes: Negative for dryness.  Respiratory: Negative for cough and shortness of breath.   Cardiovascular: Positive for palpitations. Negative for chest pain, hypertension and swelling in legs/feet.  Gastrointestinal: Negative for blood in stool, constipation and diarrhea.  Endocrine: Negative for increased urination.  Genitourinary: Negative for painful urination.    Musculoskeletal: Positive for arthralgias, joint pain, myalgias, morning stiffness and myalgias. Negative for joint swelling, muscle weakness and muscle tenderness.  Skin: Negative.  Negative for rash, hair loss and sensitivity to sunlight.  Neurological: Negative for dizziness, numbness and headaches.  Psychiatric/Behavioral: Negative for depressed mood and sleep disturbance.    PMFS History:  Patient Active Problem List   Diagnosis Date Noted  . Fibromyalgia syndrome 06/26/2016  . Other fatigue 06/26/2016  . Other insomnia 06/26/2016  . Arthralgia of both knees 06/26/2016  . Pain in both feet 06/26/2016  . Primary osteoarthritis of both feet 06/26/2016  . Primary osteoarthritis of both knees 06/26/2016  . DDD (degenerative disc disease), cervical 06/26/2016  . 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 DDD (degenerative disc disease), lumbar 06/26/2016  . History of hypertension 06/26/2016  . History of high cholesterol 06/26/2016  . Other sleep apnea 06/26/2016  . History of osteopenia 06/26/2016  . 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 THYROID NODULE, LEFT 01/03/2008  . HYPERLIPIDEMIA 01/03/2008  . DEPRESSION 01/03/2008  . MIGRAINE HEADACHE 01/03/2008  . ALLERGIC RHINITIS 01/03/2008  . ALOPECIA 01/03/2008  . ALLERGIC ARTHRITIS SITE UNSPECIFIED 01/03/2008    History reviewed. No pertinent past medical history.  History reviewed. No pertinent family history. History reviewed. No pertinent surgical history. Social History   Social History Narrative  . Not on file     Objective: Vital Signs: BP 138/76 (BP Location: Left Arm, Patient Position: Sitting,  Cuff Size: Normal)   Pulse 87   Ht 5\' 4"  (1.626 m)   Wt 181 lb (82.1 kg)   BMI 31.07 kg/m    Physical Exam  Constitutional: She is oriented to person, place, and time. She appears well-developed and well-nourished.  HENT:  Head: Normocephalic and atraumatic.  Eyes: Conjunctivae and EOM are  normal.  Neck: Normal range of motion.  Cardiovascular: Normal rate, regular rhythm, normal heart sounds and intact distal pulses.  Pulmonary/Chest: Effort normal and breath sounds normal.  Abdominal: Soft. Bowel sounds are normal.  Lymphadenopathy:    She has no cervical adenopathy.  Neurological: She is alert and oriented to person, place, and time.  Skin: Skin is warm and dry. Capillary refill takes less than 2 seconds.  Psychiatric: She has a normal mood and affect. Her behavior is normal.  Nursing note and vitals reviewed.    Musculoskeletal Exam: C-spine, thoracic, and lumbar spine good ROM.  Shoulder joints, elbow joints, and wrist joints good ROM.  MCPs, PIPs, and DIPs good ROM with no synovitis. Tenderness of MCP joints with no synovitis. PIP and DIP synovial thickening consistent with osteoarthritis.  Hip joints, knee joints, and ankle joints good ROM.  Mild crepitus of bilateral knees.  No effusion or warmth.  No midline spinal tenderness.  No SI joint tenderness. Tenderness of right trochanteric bursa.    CDAI Exam: No CDAI exam completed.    Investigation: No additional findings. CBC Latest Ref Rng & Units 06/28/2016  WBC 3.8 - 10.8 K/uL 9.7  Hemoglobin 11.7 - 15.5 g/dL 14.2  Hematocrit 35.0 - 45.0 % 42.2  Platelets 140 - 400 K/uL 436(H)   CMP Latest Ref Rng & Units 06/28/2016  Glucose 65 - 99 mg/dL 109(H)  BUN 7 - 25 mg/dL 15  Creatinine 0.50 - 0.99 mg/dL 0.89  Sodium 135 - 146 mmol/L 139  Potassium 3.5 - 5.3 mmol/L 4.9  Chloride 98 - 110 mmol/L 104  CO2 20 - 31 mmol/L 26  Calcium 8.6 - 10.4 mg/dL 10.2  Total Protein 6.1 - 8.1 g/dL 6.9  Total Bilirubin 0.2 - 1.2 mg/dL 0.4  Alkaline Phos 33 - 130 U/L 51  AST 10 - 35 U/L 35  ALT 6 - 29 U/L 41(H)    Imaging: No results found.  Speciality Comments: No specialty comments available.    Procedures:  No procedures performed Allergies: Pseudoephedrine and Sulfonamide derivatives   Assessment / Plan:     Visit  Diagnoses: Fibromyalgia: She has been flaring with the weather changes. She uses Aleve or Advil for pain relief.  She takes Klonopin at bedtime to help with sleep.  She is going to continue to exercise and work on good sleep hygiene.      Other fatigue: Chronic.  Discussed the importance of exercise.  She is going to continue to walk and perform stretching exercises.   Other insomnia: Improved. Good sleep hygiene was discussed. She takes Klonopin at bedtime.   Pain in both hands - No synovitis on exam. She is having increased pain and swelling in her all MCP joints.  We are going to check autoimmune labs today.  An ultrasound of her hands will be scheduled to assess for synovitis. Plan: CBC with Differential/Platelet, COMPLETE METABOLIC PANEL WITH GFR, Uric acid, Sedimentation rate, Rheumatoid factor, Cyclic citrul peptide antibody, IgG, ANA   Osteopenia of multiple sites: DEXA scan 06/12/16  Primary osteoarthritis of both knees: No warmth or effusion.  She has mild crepitus on exam.  Primary osteoarthritis of both feet: Proper fitting shoes were discussed.    DDD (degenerative disc disease), cervical: Chronic discomfort and stiffness   DDD (degenerative disc disease), thoracic: Chronic discomfort and stiffness   DDD (degenerative disc disease), lumbar: Chronic discomfort and stiffness   Other medical conditions are listed as follows:   History of hypertension  History of hypercholesterolemia  History of sleep apnea  History of migraine  History of depression  History of hyperlipidemia    Orders: Orders Placed This Encounter  Procedures  . CBC with Differential/Platelet  . COMPLETE METABOLIC PANEL WITH GFR  . Uric acid  . Sedimentation rate  . Rheumatoid factor  . Cyclic citrul peptide antibody, IgG  . ANA   No orders of the defined types were placed in this encounter.    Follow-Up Instructions: Return in about 6 months (around 09/13/2017) for Fibromyalgia,  Osteoarthritis.  Bo Merino, MD  Note - This record has been created using Editor, commissioning.  Chart creation errors have been sought, but may not always  have been located. Such creation errors do not reflect on  the standard of medical care.

## 2017-03-15 ENCOUNTER — Ambulatory Visit: Payer: Medicare Other | Admitting: Rheumatology

## 2017-03-15 ENCOUNTER — Encounter: Payer: Self-pay | Admitting: Rheumatology

## 2017-03-15 VITALS — BP 138/76 | HR 87 | Ht 64.0 in | Wt 181.0 lb

## 2017-03-15 DIAGNOSIS — M5030 Other cervical disc degeneration, unspecified cervical region: Secondary | ICD-10-CM | POA: Diagnosis not present

## 2017-03-15 DIAGNOSIS — M170 Bilateral primary osteoarthritis of knee: Secondary | ICD-10-CM | POA: Diagnosis not present

## 2017-03-15 DIAGNOSIS — M797 Fibromyalgia: Secondary | ICD-10-CM

## 2017-03-15 DIAGNOSIS — M8589 Other specified disorders of bone density and structure, multiple sites: Secondary | ICD-10-CM

## 2017-03-15 DIAGNOSIS — Z8659 Personal history of other mental and behavioral disorders: Secondary | ICD-10-CM

## 2017-03-15 DIAGNOSIS — M5136 Other intervertebral disc degeneration, lumbar region: Secondary | ICD-10-CM | POA: Diagnosis not present

## 2017-03-15 DIAGNOSIS — R5383 Other fatigue: Secondary | ICD-10-CM

## 2017-03-15 DIAGNOSIS — M19071 Primary osteoarthritis, right ankle and foot: Secondary | ICD-10-CM

## 2017-03-15 DIAGNOSIS — M19072 Primary osteoarthritis, left ankle and foot: Secondary | ICD-10-CM

## 2017-03-15 DIAGNOSIS — Z8679 Personal history of other diseases of the circulatory system: Secondary | ICD-10-CM

## 2017-03-15 DIAGNOSIS — M79642 Pain in left hand: Secondary | ICD-10-CM

## 2017-03-15 DIAGNOSIS — Z8639 Personal history of other endocrine, nutritional and metabolic disease: Secondary | ICD-10-CM

## 2017-03-15 DIAGNOSIS — Z8669 Personal history of other diseases of the nervous system and sense organs: Secondary | ICD-10-CM

## 2017-03-15 DIAGNOSIS — M5134 Other intervertebral disc degeneration, thoracic region: Secondary | ICD-10-CM

## 2017-03-15 DIAGNOSIS — G4709 Other insomnia: Secondary | ICD-10-CM

## 2017-03-15 DIAGNOSIS — M79641 Pain in right hand: Secondary | ICD-10-CM | POA: Diagnosis not present

## 2017-03-16 LAB — COMPLETE METABOLIC PANEL WITH GFR
AG RATIO: 1.5 (calc) (ref 1.0–2.5)
ALT: 42 U/L — AB (ref 6–29)
AST: 39 U/L — AB (ref 10–35)
Albumin: 4.2 g/dL (ref 3.6–5.1)
Alkaline phosphatase (APISO): 57 U/L (ref 33–130)
BUN: 12 mg/dL (ref 7–25)
CALCIUM: 10 mg/dL (ref 8.6–10.4)
CO2: 31 mmol/L (ref 20–32)
CREATININE: 0.73 mg/dL (ref 0.50–0.99)
Chloride: 103 mmol/L (ref 98–110)
GFR, EST AFRICAN AMERICAN: 101 mL/min/{1.73_m2} (ref >=60)
GFR, EST NON AFRICAN AMERICAN: 87 mL/min/{1.73_m2} (ref >=60)
GLOBULIN: 2.8 g/dL (ref 1.9–3.7)
Glucose, Bld: 104 mg/dL — ABNORMAL HIGH (ref 65–99)
Potassium: 4.8 mmol/L (ref 3.5–5.3)
Sodium: 140 mmol/L (ref 135–146)
TOTAL PROTEIN: 7 g/dL (ref 6.1–8.1)
Total Bilirubin: 0.4 mg/dL (ref 0.2–1.2)

## 2017-03-16 LAB — ANA: ANA: NEGATIVE

## 2017-03-16 LAB — CBC WITH DIFFERENTIAL/PLATELET
BASOS PCT: 1.4 %
Basophils Absolute: 147 cells/uL (ref 0–200)
EOS PCT: 1.9 %
Eosinophils Absolute: 200 cells/uL (ref 15–500)
HEMATOCRIT: 43.1 % (ref 35.0–45.0)
Hemoglobin: 14.6 g/dL (ref 11.7–15.5)
Lymphs Abs: 2678 cells/uL (ref 850–3900)
MCH: 31.1 pg (ref 27.0–33.0)
MCHC: 33.9 g/dL (ref 32.0–36.0)
MCV: 91.7 fL (ref 80.0–100.0)
MPV: 10.3 fL (ref 7.5–12.5)
Monocytes Relative: 8.6 %
NEUTROS PCT: 62.6 %
Neutro Abs: 6573 cells/uL (ref 1500–7800)
PLATELETS: 439 10*3/uL — AB (ref 140–400)
RBC: 4.7 10*6/uL (ref 3.80–5.10)
RDW: 11.8 % (ref 11.0–15.0)
Total Lymphocyte: 25.5 %
WBC mixed population: 903 cells/uL (ref 200–950)
WBC: 10.5 10*3/uL (ref 3.8–10.8)

## 2017-03-16 LAB — URIC ACID: Uric Acid, Serum: 6.9 mg/dL (ref 2.5–7.0)

## 2017-03-16 LAB — SEDIMENTATION RATE: Sed Rate: 19 mm/h (ref 0–30)

## 2017-03-16 LAB — CYCLIC CITRUL PEPTIDE ANTIBODY, IGG: Cyclic Citrullin Peptide Ab: <16 UNITS

## 2017-03-16 LAB — RHEUMATOID FACTOR

## 2017-03-16 NOTE — Progress Notes (Signed)
Plt count stable.   LFTs elevated. Please advise patient to NSAIDs and alcohol use.  We will continue to monitor LFTs.   All other labs WNL.

## 2017-03-29 DIAGNOSIS — E7800 Pure hypercholesterolemia, unspecified: Secondary | ICD-10-CM | POA: Diagnosis not present

## 2017-03-29 DIAGNOSIS — N951 Menopausal and female climacteric states: Secondary | ICD-10-CM | POA: Diagnosis not present

## 2017-03-29 DIAGNOSIS — R739 Hyperglycemia, unspecified: Secondary | ICD-10-CM | POA: Diagnosis not present

## 2017-03-29 DIAGNOSIS — M797 Fibromyalgia: Secondary | ICD-10-CM | POA: Diagnosis not present

## 2017-04-02 DIAGNOSIS — E7800 Pure hypercholesterolemia, unspecified: Secondary | ICD-10-CM | POA: Diagnosis not present

## 2017-04-02 DIAGNOSIS — M797 Fibromyalgia: Secondary | ICD-10-CM | POA: Diagnosis not present

## 2017-04-02 DIAGNOSIS — G4730 Sleep apnea, unspecified: Secondary | ICD-10-CM | POA: Diagnosis not present

## 2017-04-02 DIAGNOSIS — M4000 Postural kyphosis, site unspecified: Secondary | ICD-10-CM | POA: Diagnosis not present

## 2017-04-02 DIAGNOSIS — N951 Menopausal and female climacteric states: Secondary | ICD-10-CM | POA: Diagnosis not present

## 2017-05-10 DIAGNOSIS — J019 Acute sinusitis, unspecified: Secondary | ICD-10-CM | POA: Diagnosis not present

## 2017-05-10 DIAGNOSIS — J209 Acute bronchitis, unspecified: Secondary | ICD-10-CM | POA: Diagnosis not present

## 2017-05-10 DIAGNOSIS — J069 Acute upper respiratory infection, unspecified: Secondary | ICD-10-CM | POA: Diagnosis not present

## 2017-07-02 DIAGNOSIS — M797 Fibromyalgia: Secondary | ICD-10-CM | POA: Diagnosis not present

## 2017-07-02 DIAGNOSIS — G4730 Sleep apnea, unspecified: Secondary | ICD-10-CM | POA: Diagnosis not present

## 2017-07-02 DIAGNOSIS — R739 Hyperglycemia, unspecified: Secondary | ICD-10-CM | POA: Diagnosis not present

## 2017-07-04 ENCOUNTER — Ambulatory Visit (INDEPENDENT_AMBULATORY_CARE_PROVIDER_SITE_OTHER): Payer: Self-pay

## 2017-07-04 ENCOUNTER — Ambulatory Visit: Payer: Medicare Other | Admitting: Rheumatology

## 2017-07-04 DIAGNOSIS — M79642 Pain in left hand: Secondary | ICD-10-CM

## 2017-07-04 DIAGNOSIS — M79641 Pain in right hand: Secondary | ICD-10-CM

## 2017-07-04 NOTE — Progress Notes (Signed)
Ultrasound examination of bilateral hands was performed per EULAR recommendations. Using 12 MHz transducer, grayscale and power Doppler bilateral second, third, and fifth MCP joints and bilateral wrist joints both dorsal and volar aspects were evaluated to look for synovitis or tenosynovitis. The findings were there was no synovitis or tenosynovitis on ultrasound examination. Right median nerve was 0.06 cm squares which was bifid and within normal limits and left median nerve was 0.07 cm squares which was bifid and within normal limits.  Impression: Ultrasound examination did not show any synovitis. Median nerves were within normal limits. Bo Merino, MD

## 2017-07-19 ENCOUNTER — Telehealth: Payer: Self-pay | Admitting: Rheumatology

## 2017-07-19 MED ORDER — TIZANIDINE HCL 4 MG PO TABS: 4.0000 mg | ORAL_TABLET | Freq: Every day | ORAL | 2 refills | Status: DC

## 2017-07-19 NOTE — Telephone Encounter (Signed)
Last Visit: 03/15/17 Next Visit: 09/13/17  Okay to refill per Dr. Estanislado Pandy.

## 2017-07-19 NOTE — Telephone Encounter (Signed)
Patient called requesting prescription refill of Tizanidine.  Patient's pharmacy is [facility] in [place].

## 2017-09-07 NOTE — Progress Notes (Deleted)
   Office Visit Note  Patient: Nichole Reyes             Date of Birth: September 10, 1950           MRN: [id number]             PCP: Rory Percy, MD Referring: Rory Percy, MD Visit Date: 09/13/2017 Occupation: @GUAROCC @    Subjective:  No chief complaint on file.   History of Present Illness: Nichole Reyes is a 67 y.o. female ***   Activities of Daily Living:  Patient reports morning stiffness for *** {minute/hour:19697}.   Patient {ACTIONS;DENIES/REPORTS:21021675::"Denies"} nocturnal pain.  Difficulty dressing/grooming: {ACTIONS;DENIES/REPORTS:21021675::"Denies"} Difficulty climbing stairs: {ACTIONS;DENIES/REPORTS:21021675::"Denies"} Difficulty getting out of chair: {ACTIONS;DENIES/REPORTS:21021675::"Denies"} Difficulty using hands for taps, buttons, cutlery, and/or writing: {ACTIONS;DENIES/REPORTS:21021675::"Denies"}   No Rheumatology ROS completed.   PMFS History:  Patient Active Problem List   Diagnosis Date Noted  . Fibromyalgia syndrome 06/26/2016  . Other fatigue 06/26/2016  . Other insomnia 06/26/2016  . Arthralgia of both knees 06/26/2016  . Pain in both feet 06/26/2016  . Primary osteoarthritis of both feet 06/26/2016  . Primary osteoarthritis of both knees 06/26/2016  . DDD (degenerative disc disease), cervical 06/26/2016  . 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 DDD (degenerative disc disease), lumbar 06/26/2016  . History of hypertension 06/26/2016  . History of high cholesterol 06/26/2016  . Other sleep apnea 06/26/2016  . History of osteopenia 06/26/2016  . 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 THYROID NODULE, LEFT 01/03/2008  . HYPERLIPIDEMIA 01/03/2008  . DEPRESSION 01/03/2008  . MIGRAINE HEADACHE 01/03/2008  . ALLERGIC RHINITIS 01/03/2008  . ALOPECIA 01/03/2008  . ALLERGIC ARTHRITIS SITE UNSPECIFIED 01/03/2008    No past medical history on file.  No family history on file. No past surgical history on  file. Social History   Social History Narrative  . Not on file     Objective: Vital Signs: There were no vitals taken for this visit.   Physical Exam   Musculoskeletal Exam: ***  CDAI Exam: No CDAI exam completed.    Investigation: No additional findings.   Imaging: No results found.  Speciality Comments: No specialty comments available.    Procedures:  No procedures performed Allergies: Pseudoephedrine and Sulfonamide derivatives   Assessment / Plan:     Visit Diagnoses: No diagnosis found.    Orders: No orders of the defined types were placed in this encounter.  No orders of the defined types were placed in this encounter.   Face-to-face time spent with patient was *** minutes. Greater than 50% of time was spent in counseling and coordination of care.  Follow-Up Instructions: No follow-ups on file.   Earnestine Mealing, CMA  Note - This record has been created using Editor, commissioning.  Chart creation errors have been sought, but may not always  have been located. Such creation errors do not reflect on  the standard of medical care.

## 2017-09-13 ENCOUNTER — Ambulatory Visit: Payer: Self-pay | Admitting: Rheumatology

## 2017-09-14 NOTE — Progress Notes (Signed)
Office Visit Note  Patient: Nichole Reyes             Date of Birth: [birth year]           MRN: [id number]             PCP: Rory Percy, MD Referring: Rory Percy, MD Visit Date: 09/25/2017 Occupation: @GUAROCC @    Subjective:  Pain in both feet   History of Present Illness: Nichole Reyes is a 67 y.o. female with history of fibromyalgia, osteoarthritis, and DDD.  She takes Zanaflex 4 mg at bedtime for muscle spasms.  She needs refill of Zanaflex today.  She reports she continues to have muscle tenderness and muscle tension in the trapezius muscles bilaterally.  She continues to have chronic lower back pain.  She denies any recent flares of fibromyalgia but continues to have occasional muscle aches and muscle tenderness.  She denies any pain or stiffness in her neck at this time.  She reports that over the past few months she is been having increased pain in bilateral feet.  She denies any swelling.  She states that she has been having discomfort with range of motion of her toes and ankles.  She states the pain is most severe in the morning.  She denies any plantar fasciitis or Achilles tendinitis.  She states she occasionally also have bilateral knee pain.  She states the pain can be severe with range of motion at times.  She states that at times she will have a sensation of both knees giving out.  She reports that she fell about 1 month ago on her right knee but does not have any discomfort at this time.    Activities of Daily Living:  Patient reports morning stiffness for 10-15 minutes.   Patient Reports nocturnal pain.  Difficulty dressing/grooming: Denies Difficulty climbing stairs: Denies Difficulty getting out of chair: Denies Difficulty using hands for taps, buttons, cutlery, and/or writing: Denies   Review of Systems  Constitutional: Positive for fatigue.  HENT: Negative for mouth sores, mouth dryness and nose dryness.   Eyes: Negative for pain, visual disturbance  and dryness.  Respiratory: Negative for cough, hemoptysis, shortness of breath and difficulty breathing.   Cardiovascular: Negative for chest pain, palpitations, hypertension and swelling in legs/feet.  Gastrointestinal: Negative for blood in stool, constipation and diarrhea.  Endocrine: Negative for increased urination.  Genitourinary: Negative for painful urination.  Musculoskeletal: Positive for arthralgias, joint pain, myalgias, morning stiffness, muscle tenderness and myalgias. Negative for joint swelling and muscle weakness.  Skin: Negative for color change, pallor, rash, hair loss, nodules/bumps, skin tightness, ulcers and sensitivity to sunlight.  Allergic/Immunologic: Negative for susceptible to infections.  Neurological: Negative for dizziness, numbness, headaches and weakness.  Hematological: Negative for swollen glands.  Psychiatric/Behavioral: Positive for sleep disturbance. Negative for depressed mood. The patient is not nervous/anxious.     PMFS History:  Patient Active Problem List   Diagnosis Date Noted  . Fibromyalgia syndrome 06/26/2016  . Other fatigue 06/26/2016  . Other insomnia 06/26/2016  . Arthralgia of both knees 06/26/2016  . Pain in both feet 06/26/2016  . Primary osteoarthritis of both feet 06/26/2016  . Primary osteoarthritis of both knees 06/26/2016  . DDD (degenerative disc disease), cervical 06/26/2016  . 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 DDD (degenerative disc disease), lumbar 06/26/2016  . History of hypertension 06/26/2016  . History of high cholesterol 06/26/2016  . Other sleep apnea 06/26/2016  . History of osteopenia  06/26/2016  . 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 THYROID NODULE, LEFT 01/03/2008  . HYPERLIPIDEMIA 01/03/2008  . DEPRESSION 01/03/2008  . MIGRAINE HEADACHE 01/03/2008  . ALLERGIC RHINITIS 01/03/2008  . ALOPECIA 01/03/2008  . ALLERGIC ARTHRITIS SITE UNSPECIFIED 01/03/2008    Past Medical  History:  Diagnosis Date  . Diabetes mellitus without complication ([place])   . Fibromyalgia   . High cholesterol   . Osteoporosis     Family History  Problem Relation Age of Onset  . Hypertension Mother   . Diabetes Mother   . Osteoporosis Mother   . Hypertension Father   . Heart disease Father   . Cancer Sister   . Cancer Brother   . Heart disease Brother    Past Surgical History:  Procedure Laterality Date  . ABDOMINAL HYSTERECTOMY     Social History   Social History Narrative  . Not on file     Objective: Vital Signs: BP (!) 150/79 (BP Location: Left Arm, Patient Position: Sitting, Cuff Size: Small)   Pulse 75   Resp 14   Ht 5\' 5"  (1.651 m)   Wt 177 lb (80.3 kg)   BMI 29.45 kg/m    Physical Exam  Constitutional: She is oriented to person, place, and time. She appears well-developed and well-nourished.  HENT:  Head: Normocephalic and atraumatic.  Eyes: Conjunctivae and EOM are normal.  Neck: Normal range of motion.  Cardiovascular: Normal rate, regular rhythm, normal heart sounds and intact distal pulses.  Pulmonary/Chest: Effort normal and breath sounds normal.  Abdominal: Soft. Bowel sounds are normal.  Lymphadenopathy:    She has no cervical adenopathy.  Neurological: She is alert and oriented to person, place, and time.  Skin: Skin is warm and dry. Capillary refill takes less than 2 seconds.  Psychiatric: She has a normal mood and affect. Her behavior is normal.  Nursing note and vitals reviewed.    Musculoskeletal Exam: Cervical spine good ROM.  thoracic kyphosis noted.  Lumbar spine good ROM.  Tenderness of bilateral trapezius muscles. Shoulder joints, elbow joints, wrist joints, MCPs, PIPs, and DIPs good ROM with no discomfort.  PIP and DIP synovial thickening.  Hip joints, knee joints, ankle joints, MTPs, PIPs, and DIPs good ROM with no synovitis.  No warmth or effusion of knee joints.  No tenderness of trochanteric bursa bilaterally.  No achilles  tendonitis or plantar fasciitis.  No tenderness along the ankle joint line.  No warmth or effusion of ankle joints.    1st MTP synovial thickening bilaterally.   CDAI Exam: No CDAI exam completed.    Investigation: No additional findings. CBC Latest Ref Rng & Units 03/15/2017 06/28/2016  WBC 3.8 - 10.8 Thousand/uL 10.5 9.7  Hemoglobin 11.7 - 15.5 g/dL 14.6 14.2  Hematocrit 35.0 - 45.0 % 43.1 42.2  Platelets 140 - 400 Thousand/uL 439(H) 436(H)   CMP Latest Ref Rng & Units 03/15/2017 06/28/2016  Glucose 65 - 99 mg/dL 104(H) 109(H)  BUN 7 - 25 mg/dL 12 15  Creatinine 0.50 - 0.99 mg/dL 0.73 0.89  Sodium 135 - 146 mmol/L 140 139  Potassium 3.5 - 5.3 mmol/L 4.8 4.9  Chloride 98 - 110 mmol/L 103 104  CO2 20 - 32 mmol/L 31 26  Calcium 8.6 - 10.4 mg/dL 10.0 10.2  Total Protein 6.1 - 8.1 g/dL 7.0 6.9  Total Bilirubin 0.2 - 1.2 mg/dL 0.4 0.4  Alkaline Phos 33 - 130 U/L - 51  AST 10 - 35 U/L 39(H) 35  ALT 6 - 29 U/L 42(H) 41(H)     Imaging: Xr Foot 2 Views Left  Result Date: 09/25/2017 First PIP, all PIP and DIP narrowing was noted.  No erosive changes were noted.  No intertarsal joint space narrowing was noted.  No tibiotalar or subtalar joint space narrowing was noted.  A small calcaneal spur was noted. Impression: These findings are consistent with osteoarthritis of the foot.  Xr Foot 2 Views Right  Result Date: 09/25/2017 First PIP, all PIP and DIP narrowing was noted.  No erosive changes were noted.  No intertarsal joint space narrowing was noted.  No tibiotalar or subtalar joint space narrowing was noted.  A small calcaneal spur was noted. Impression: These findings are consistent with osteoarthritis of the foot.   Speciality Comments: No specialty comments available.    Procedures:  No procedures performed Allergies: Pseudoephedrine and Sulfonamide derivatives   Assessment / Plan:     Visit Diagnoses: Fibromyalgia: She continues to have generalized muscle aches and muscle  tenderness due to fibromyalgia.  She has tenderness and tension in bilateral trapezius muscles.  She takes Zanaflex 4 mg at bedtime for muscle spasms.  A refill of Zanaflex was sent to the pharmacy.  We discussed the importance of light exercise on a regular basis.   Other fatigue: She has chronic fatigue which is related to insomnia.  We discussed the importance of regularly exercising.   Other insomnia: Chronic.  Good sleep hygiene was discussed.  Primary osteoarthritis of both knees: No warmth or effusion.  She is good range of motion.  She has no discomfort in any joints at this time.  Occasionally she will have discomfort with range of motion.  She also experiences sensation of both knees giving out.  She fell about 1 month ago but it was not related to her knee joints.  Primary osteoarthritis of both feet: She has bilateral first MTP synovial thickening.  She has no synovitis on exam.  She has been having increased discomfort in her feet.  X-rays of both feet were obtained today. X-rays were consistent with osteoarthritis.  We discussed the importance of wearing proper fitting shoes.  A referral will be placed to podiatry for evaluation and possible fitting for custom orthotics.    Pain in both feet -She has been having increased discomfort in bilateral feet.  She has no synovitis on exam.  She has no plantar fasciitis or Achilles tendinitis.  She has good range of motion on exam.  She has no tenderness along the bilateral ankle joint line.  No warmth or effusion of bilateral ankle joints.  X-rays of bilateral feet were obtained today.  The x-rays were consistent with osteoarthritis and calcaneal spur.  We discussed the importance of wearing proper fitting shoes.  We discussed a referral to podiatry she continues to have pain in both feet.  Plan: XR Foot 2 Views Left, XR Foot 2 Views Right   DDD (degenerative disc disease), cervical: She has good range of motion with no discomfort at this  time.  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  No midline spinal tenderness.  DDD (degenerative disc disease), lumbar: Chronic pain.  She has good range of motion on exam.  No midline spinal tenderness.  Other medical conditions are listed as follows:  History of hypertension  History of hypercholesterolemia  History of sleep apnea  History of migraine  History of depression  History of hyperlipidemia     Orders: Orders Placed This  Encounter  Procedures  . XR Foot 2 Views Left  . XR Foot 2 Views Right   Meds ordered this encounter  Medications  . tiZANidine (ZANAFLEX) 4 MG tablet    Sig: Take 1 tablet (4 mg total) by mouth at bedtime.    Dispense:  30 tablet    Refill:  2    Face-to-face time spent with patient was 30 minutes. Greater than 50% of time was spent in counseling and coordination of care.  Follow-Up Instructions: Return in about 6 months (around 03/28/2018) for Fibromyalgia, Osteoarthritis, DDD.   Hazel Sams, PA-C  I examined and evaluated the patient with Hazel Sams PA.  Patient has been experiencing significant pain in her bilateral feet.  She has some calluses under her MTPs on my exam.  I have advised her to schedule an appointment with podiatrist for proper fitting shoes and orthotics.  Her x-rays were consistent with osteoarthritis of the feet.  The plan of care was discussed as noted above.  Bo Merino, MD  Note - This record has been created using Editor, commissioning.  Chart creation errors have been sought, but may not always  have been located. Such creation errors do not reflect on  the standard of medical care.

## 2017-09-25 ENCOUNTER — Ambulatory Visit (INDEPENDENT_AMBULATORY_CARE_PROVIDER_SITE_OTHER): Payer: Self-pay

## 2017-09-25 ENCOUNTER — Ambulatory Visit: Payer: Medicare Other | Admitting: Rheumatology

## 2017-09-25 ENCOUNTER — Encounter: Payer: Self-pay | Admitting: Physician Assistant

## 2017-09-25 ENCOUNTER — Encounter (INDEPENDENT_AMBULATORY_CARE_PROVIDER_SITE_OTHER): Payer: Self-pay

## 2017-09-25 VITALS — BP 150/79 | HR 75 | Resp 14 | Ht 65.0 in | Wt 177.0 lb

## 2017-09-25 DIAGNOSIS — M5134 Other intervertebral disc degeneration, thoracic region: Secondary | ICD-10-CM | POA: Diagnosis not present

## 2017-09-25 DIAGNOSIS — M5136 Other intervertebral disc degeneration, lumbar region: Secondary | ICD-10-CM

## 2017-09-25 DIAGNOSIS — M5030 Other cervical disc degeneration, unspecified cervical region: Secondary | ICD-10-CM | POA: Diagnosis not present

## 2017-09-25 DIAGNOSIS — M19071 Primary osteoarthritis, right ankle and foot: Secondary | ICD-10-CM | POA: Diagnosis not present

## 2017-09-25 DIAGNOSIS — Z8639 Personal history of other endocrine, nutritional and metabolic disease: Secondary | ICD-10-CM

## 2017-09-25 DIAGNOSIS — Z8679 Personal history of other diseases of the circulatory system: Secondary | ICD-10-CM | POA: Diagnosis not present

## 2017-09-25 DIAGNOSIS — M170 Bilateral primary osteoarthritis of knee: Secondary | ICD-10-CM

## 2017-09-25 DIAGNOSIS — M79672 Pain in left foot: Secondary | ICD-10-CM | POA: Diagnosis not present

## 2017-09-25 DIAGNOSIS — G4709 Other insomnia: Secondary | ICD-10-CM

## 2017-09-25 DIAGNOSIS — Z8659 Personal history of other mental and behavioral disorders: Secondary | ICD-10-CM | POA: Diagnosis not present

## 2017-09-25 DIAGNOSIS — M797 Fibromyalgia: Secondary | ICD-10-CM

## 2017-09-25 DIAGNOSIS — M79671 Pain in right foot: Secondary | ICD-10-CM

## 2017-09-25 DIAGNOSIS — Z8669 Personal history of other diseases of the nervous system and sense organs: Secondary | ICD-10-CM

## 2017-09-25 DIAGNOSIS — R5383 Other fatigue: Secondary | ICD-10-CM

## 2017-09-25 DIAGNOSIS — M19072 Primary osteoarthritis, left ankle and foot: Secondary | ICD-10-CM

## 2017-09-25 MED ORDER — TIZANIDINE HCL 4 MG PO TABS
4.0000 mg | ORAL_TABLET | Freq: Every day | ORAL | 2 refills | Status: DC
Start: 2017-09-25 — End: 2018-01-21

## 2017-10-18 ENCOUNTER — Other Ambulatory Visit: Payer: Self-pay | Admitting: Rheumatology

## 2018-01-16 ENCOUNTER — Encounter: Payer: Self-pay | Admitting: Cardiovascular Disease

## 2018-01-16 DIAGNOSIS — E7800 Pure hypercholesterolemia, unspecified: Secondary | ICD-10-CM | POA: Diagnosis not present

## 2018-01-16 DIAGNOSIS — R739 Hyperglycemia, unspecified: Secondary | ICD-10-CM | POA: Diagnosis not present

## 2018-01-16 DIAGNOSIS — M797 Fibromyalgia: Secondary | ICD-10-CM | POA: Diagnosis not present

## 2018-01-17 DIAGNOSIS — Z23 Encounter for immunization: Secondary | ICD-10-CM | POA: Diagnosis not present

## 2018-01-17 DIAGNOSIS — Z0001 Encounter for general adult medical examination with abnormal findings: Secondary | ICD-10-CM | POA: Diagnosis not present

## 2018-01-21 ENCOUNTER — Other Ambulatory Visit: Payer: Self-pay | Admitting: Physician Assistant

## 2018-01-21 NOTE — Telephone Encounter (Signed)
Last Visit: 09/25/17 Next Visit: 03/28/18  Okay to refill per Dr. Estanislado Pandy

## 2018-02-20 ENCOUNTER — Other Ambulatory Visit: Payer: Self-pay | Admitting: *Deleted

## 2018-02-20 DIAGNOSIS — Z800 Family history of malignant neoplasm of digestive organs: Secondary | ICD-10-CM | POA: Diagnosis not present

## 2018-02-20 MED ORDER — TIZANIDINE HCL 4 MG PO TABS: 4.0000 mg | ORAL_TABLET | Freq: Every day | ORAL | 0 refills | Status: DC

## 2018-02-20 NOTE — Telephone Encounter (Signed)
Refill request received via fax  Last Visit: 09/25/17 Next Visit: 03/28/18  Okay to refill per Dr. Estanislado Pandy

## 2018-03-15 NOTE — Progress Notes (Deleted)
Office Visit Note  Patient: Nichole Reyes             Date of Birth: [birth year]           MRN: [id number]             PCP: Rory Percy, MD Referring: Rory Percy, MD Visit Date: 03/28/2018 Occupation: @GUAROCC @  Subjective:  No chief complaint on file.   History of Present Illness: Nichole Reyes is a 67 y.o. female ***   Activities of Daily Living:  Patient reports morning stiffness for *** {minute/hour:19697}.   Patient {ACTIONS;DENIES/REPORTS:21021675::"Denies"} nocturnal pain.  Difficulty dressing/grooming: {ACTIONS;DENIES/REPORTS:21021675::"Denies"} Difficulty climbing stairs: {ACTIONS;DENIES/REPORTS:21021675::"Denies"} Difficulty getting out of chair: {ACTIONS;DENIES/REPORTS:21021675::"Denies"} Difficulty using hands for taps, buttons, cutlery, and/or writing: {ACTIONS;DENIES/REPORTS:21021675::"Denies"}  No Rheumatology ROS completed.   PMFS History:  Patient Active Problem List   Diagnosis Date Noted  . Fibromyalgia syndrome 06/26/2016  . Other fatigue 06/26/2016  . Other insomnia 06/26/2016  . Arthralgia of both knees 06/26/2016  . Pain in both feet 06/26/2016  . Primary osteoarthritis of both feet 06/26/2016  . Primary osteoarthritis of both knees 06/26/2016  . DDD (degenerative disc disease), cervical 06/26/2016  . 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 DDD (degenerative disc disease), lumbar 06/26/2016  . History of hypertension 06/26/2016  . History of high cholesterol 06/26/2016  . Other sleep apnea 06/26/2016  . History of osteopenia 06/26/2016  . 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 THYROID NODULE, LEFT 01/03/2008  . HYPERLIPIDEMIA 01/03/2008  . DEPRESSION 01/03/2008  . MIGRAINE HEADACHE 01/03/2008  . ALLERGIC RHINITIS 01/03/2008  . ALOPECIA 01/03/2008  . ALLERGIC ARTHRITIS SITE UNSPECIFIED 01/03/2008    Past Medical History:  Diagnosis Date  . Diabetes mellitus without complication ([place])   . Fibromyalgia    . High cholesterol   . Osteoporosis     Family History  Problem Relation Age of Onset  . Hypertension Mother   . Diabetes Mother   . Osteoporosis Mother   . Hypertension Father   . Heart disease Father   . Cancer Sister   . Cancer Brother   . Heart disease Brother    Past Surgical History:  Procedure Laterality Date  . ABDOMINAL HYSTERECTOMY     Social History   Social History Narrative  . Not on file    Objective: Vital Signs: There were no vitals taken for this visit.   Physical Exam   Musculoskeletal Exam: ***  CDAI Exam: CDAI Score: Not documented Patient Global Assessment: Not documented; Provider Global Assessment: Not documented Swollen: Not documented; Tender: Not documented Joint Exam   Not documented   There is currently no information documented on the homunculus. Go to the Rheumatology activity and complete the homunculus joint exam.  Investigation: No additional findings.  Imaging: No results found.  Recent Labs: Lab Results  Component Value Date   WBC 10.5 03/15/2017   HGB 14.6 03/15/2017   PLT 439 (H) 03/15/2017   NA 140 03/15/2017   K 4.8 03/15/2017   CL 103 03/15/2017   CO2 31 03/15/2017   GLUCOSE 104 (H) 03/15/2017   BUN 12 03/15/2017   CREATININE 0.73 03/15/2017   BILITOT 0.4 03/15/2017   ALKPHOS 51 06/28/2016   AST 39 (H) 03/15/2017   ALT 42 (H) 03/15/2017   PROT 7.0 03/15/2017   ALBUMIN 4.1 06/28/2016   CALCIUM 10.0 03/15/2017   GFRAA 101 03/15/2017    Speciality Comments: No specialty comments available.  Procedures:  No procedures performed  Allergies: Pseudoephedrine and Sulfonamide derivatives   Assessment / Plan:     Visit Diagnoses: Fibromyalgia - Zanaflex 4 mg at bedtime for muscle spasms.   Other fatigue  Other insomnia  Primary osteoarthritis of both knees  Primary osteoarthritis of both feet  DDD (degenerative disc disease), cervical  DDD (degenerative disc disease), thoracic  DDD (degenerative  disc disease), lumbar  History of hypertension  History of hypercholesterolemia  History of sleep apnea  History of migraine  History of depression  History of hyperlipidemia   Orders: No orders of the defined types were placed in this encounter.  No orders of the defined types were placed in this encounter.   Face-to-face time spent with patient was *** minutes. Greater than 50% of time was spent in counseling and coordination of care.  Follow-Up Instructions: No follow-ups on file.   Ofilia Neas, PA-C  Note - This record has been created using Dragon software.  Chart creation errors have been sought, but may not always  have been located. Such creation errors do not reflect on  the standard of medical care.

## 2018-03-26 DIAGNOSIS — Z1231 Encounter for screening mammogram for malignant neoplasm of breast: Secondary | ICD-10-CM | POA: Diagnosis not present

## 2018-03-27 NOTE — Progress Notes (Deleted)
Office Visit Note  Patient: Nichole Reyes             Date of Birth: [birth year]           MRN: [id number]             PCP: Rory Percy, MD Referring: Rory Percy, MD Visit Date: 04/03/2018 Occupation: @GUAROCC @  Subjective:  No chief complaint on file.   History of Present Illness: Nichole Reyes is a 68 y.o. female ***   Activities of Daily Living:  Patient reports morning stiffness for *** {minute/hour:19697}.   Patient {ACTIONS;DENIES/REPORTS:21021675::"Denies"} nocturnal pain.  Difficulty dressing/grooming: {ACTIONS;DENIES/REPORTS:21021675::"Denies"} Difficulty climbing stairs: {ACTIONS;DENIES/REPORTS:21021675::"Denies"} Difficulty getting out of chair: {ACTIONS;DENIES/REPORTS:21021675::"Denies"} Difficulty using hands for taps, buttons, cutlery, and/or writing: {ACTIONS;DENIES/REPORTS:21021675::"Denies"}  No Rheumatology ROS completed.   PMFS History:  Patient Active Problem List   Diagnosis Date Noted  . Fibromyalgia syndrome 06/26/2016  . Other fatigue 06/26/2016  . Other insomnia 06/26/2016  . Arthralgia of both knees 06/26/2016  . Pain in both feet 06/26/2016  . Primary osteoarthritis of both feet 06/26/2016  . Primary osteoarthritis of both knees 06/26/2016  . DDD (degenerative disc disease), cervical 06/26/2016  . 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 DDD (degenerative disc disease), lumbar 06/26/2016  . History of hypertension 06/26/2016  . History of high cholesterol 06/26/2016  . Other sleep apnea 06/26/2016  . History of osteopenia 06/26/2016  . 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 THYROID NODULE, LEFT 01/03/2008  . HYPERLIPIDEMIA 01/03/2008  . DEPRESSION 01/03/2008  . MIGRAINE HEADACHE 01/03/2008  . ALLERGIC RHINITIS 01/03/2008  . ALOPECIA 01/03/2008  . ALLERGIC ARTHRITIS SITE UNSPECIFIED 01/03/2008    Past Medical History:  Diagnosis Date  . Diabetes mellitus without complication ([place])   . Fibromyalgia    . High cholesterol   . Osteoporosis     Family History  Problem Relation Age of Onset  . Hypertension Mother   . Diabetes Mother   . Osteoporosis Mother   . Hypertension Father   . Heart disease Father   . Cancer Sister   . Cancer Brother   . Heart disease Brother    Past Surgical History:  Procedure Laterality Date  . ABDOMINAL HYSTERECTOMY     Social History   Social History Narrative  . Not on file    Objective: Vital Signs: There were no vitals taken for this visit.   Physical Exam   Musculoskeletal Exam: ***  CDAI Exam: CDAI Score: Not documented Patient Global Assessment: Not documented; Provider Global Assessment: Not documented Swollen: Not documented; Tender: Not documented Joint Exam   Not documented   There is currently no information documented on the homunculus. Go to the Rheumatology activity and complete the homunculus joint exam.  Investigation: No additional findings.  Imaging: No results found.  Recent Labs: Lab Results  Component Value Date   WBC 10.5 03/15/2017   HGB 14.6 03/15/2017   PLT 439 (H) 03/15/2017   NA 140 03/15/2017   K 4.8 03/15/2017   CL 103 03/15/2017   CO2 31 03/15/2017   GLUCOSE 104 (H) 03/15/2017   BUN 12 03/15/2017   CREATININE 0.73 03/15/2017   BILITOT 0.4 03/15/2017   ALKPHOS 51 06/28/2016   AST 39 (H) 03/15/2017   ALT 42 (H) 03/15/2017   PROT 7.0 03/15/2017   ALBUMIN 4.1 06/28/2016   CALCIUM 10.0 03/15/2017   GFRAA 101 03/15/2017    Speciality Comments: No specialty comments available.  Procedures:  No procedures performed  Allergies: Pseudoephedrine and Sulfonamide derivatives   Assessment / Plan:     Visit Diagnoses: No diagnosis found.   Orders: No orders of the defined types were placed in this encounter.  No orders of the defined types were placed in this encounter.   Face-to-face time spent with patient was *** minutes. Greater than 50% of time was spent in counseling and coordination  of care.  Follow-Up Instructions: No follow-ups on file.   Earnestine Mealing, CMA  Note - This record has been created using Editor, commissioning.  Chart creation errors have been sought, but may not always  have been located. Such creation errors do not reflect on  the standard of medical care.

## 2018-03-28 ENCOUNTER — Ambulatory Visit: Payer: Self-pay | Admitting: Physician Assistant

## 2018-03-29 ENCOUNTER — Other Ambulatory Visit: Payer: Self-pay | Admitting: Rheumatology

## 2018-03-29 NOTE — Telephone Encounter (Signed)
Last Visit: 09/25/17 Next Visit: 04/03/18  Okay to refill per Dr. Estanislado Pandy

## 2018-04-03 ENCOUNTER — Ambulatory Visit: Payer: Self-pay | Admitting: Physician Assistant

## 2018-04-17 NOTE — Progress Notes (Deleted)
Office Visit Note  Patient: Nichole Reyes             Date of Birth: 1950-05-03           MRN: [id number]             PCP: Rory Percy, MD Referring: Rory Percy, MD Visit Date: 05/01/2018 Occupation: @GUAROCC @  Subjective:  No chief complaint on file.   History of Present Illness: Nichole Reyes is a 68 y.o. female ***   Activities of Daily Living:  Patient reports morning stiffness for *** {minute/hour:19697}.   Patient {ACTIONS;DENIES/REPORTS:21021675::"Denies"} nocturnal pain.  Difficulty dressing/grooming: {ACTIONS;DENIES/REPORTS:21021675::"Denies"} Difficulty climbing stairs: {ACTIONS;DENIES/REPORTS:21021675::"Denies"} Difficulty getting out of chair: {ACTIONS;DENIES/REPORTS:21021675::"Denies"} Difficulty using hands for taps, buttons, cutlery, and/or writing: {ACTIONS;DENIES/REPORTS:21021675::"Denies"}  No Rheumatology ROS completed.   PMFS History:  Patient Active Problem List   Diagnosis Date Noted  . Fibromyalgia syndrome 06/26/2016  . Other fatigue 06/26/2016  . Other insomnia 06/26/2016  . Arthralgia of both knees 06/26/2016  . Pain in both feet 06/26/2016  . Primary osteoarthritis of both feet 06/26/2016  . Primary osteoarthritis of both knees 06/26/2016  . DDD (degenerative disc disease), cervical 06/26/2016  . 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 DDD (degenerative disc disease), lumbar 06/26/2016  . History of hypertension 06/26/2016  . History of high cholesterol 06/26/2016  . Other sleep apnea 06/26/2016  . History of osteopenia 06/26/2016  . 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 THYROID NODULE, LEFT 01/03/2008  . HYPERLIPIDEMIA 01/03/2008  . DEPRESSION 01/03/2008  . MIGRAINE HEADACHE 01/03/2008  . ALLERGIC RHINITIS 01/03/2008  . ALOPECIA 01/03/2008  . ALLERGIC ARTHRITIS SITE UNSPECIFIED 01/03/2008    Past Medical History:  Diagnosis Date  . Diabetes mellitus without complication ([place])   . Fibromyalgia    . High cholesterol   . Osteoporosis     Family History  Problem Relation Age of Onset  . Hypertension Mother   . Diabetes Mother   . Osteoporosis Mother   . Hypertension Father   . Heart disease Father   . Cancer Sister   . Cancer Brother   . Heart disease Brother    Past Surgical History:  Procedure Laterality Date  . ABDOMINAL HYSTERECTOMY     Social History   Social History Narrative  . Not on file   Immunization History  Administered Date(s) Administered  . Influenza-Unspecified 11/18/2013     Objective: Vital Signs: There were no vitals taken for this visit.   Physical Exam   Musculoskeletal Exam: ***  CDAI Exam: CDAI Score: Not documented Patient Global Assessment: Not documented; Provider Global Assessment: Not documented Swollen: Not documented; Tender: Not documented Joint Exam   Not documented   There is currently no information documented on the homunculus. Go to the Rheumatology activity and complete the homunculus joint exam.  Investigation: No additional findings.  Imaging: No results found.  Recent Labs: Lab Results  Component Value Date   WBC 10.5 03/15/2017   HGB 14.6 03/15/2017   PLT 439 (H) 03/15/2017   NA 140 03/15/2017   K 4.8 03/15/2017   CL 103 03/15/2017   CO2 31 03/15/2017   GLUCOSE 104 (H) 03/15/2017   BUN 12 03/15/2017   CREATININE 0.73 03/15/2017   BILITOT 0.4 03/15/2017   ALKPHOS 51 06/28/2016   AST 39 (H) 03/15/2017   ALT 42 (H) 03/15/2017   PROT 7.0 03/15/2017   ALBUMIN 4.1 06/28/2016   CALCIUM 10.0 03/15/2017   GFRAA 101 03/15/2017  Speciality Comments: No specialty comments available.  Procedures:  No procedures performed Allergies: Pseudoephedrine and Sulfonamide derivatives   Assessment / Plan:     Visit Diagnoses: Fibromyalgia - Zanaflex 4 mg at bedtime for muscle spasms  Other insomnia  Other fatigue  Primary osteoarthritis of both knees  Primary osteoarthritis of both feet - Referred to  podiatry   DDD (degenerative disc disease), cervical  DDD (degenerative disc disease), thoracic  DDD (degenerative disc disease), lumbar  History of hypertension  History of hypercholesterolemia  History of sleep apnea  History of migraine  History of depression  History of hyperlipidemia  Osteopenia of multiple sites   Orders: No orders of the defined types were placed in this encounter.  No orders of the defined types were placed in this encounter.   Face-to-face time spent with patient was *** minutes. Greater than 50% of time was spent in counseling and coordination of care.  Follow-Up Instructions: No follow-ups on file.   Ofilia Neas, PA-C  Note - This record has been created using Dragon software.  Chart creation errors have been sought, but may not always  have been located. Such creation errors do not reflect on  the standard of medical care.

## 2018-04-29 ENCOUNTER — Other Ambulatory Visit: Payer: Self-pay | Admitting: Rheumatology

## 2018-04-29 NOTE — Telephone Encounter (Signed)
Last Visit: 09/25/17 Next Visit: 05/01/18   Okay to refill per Dr. Estanislado Pandy

## 2018-05-01 ENCOUNTER — Ambulatory Visit: Payer: Self-pay | Admitting: Physician Assistant

## 2018-05-07 DIAGNOSIS — M7582 Other shoulder lesions, left shoulder: Secondary | ICD-10-CM | POA: Diagnosis not present

## 2018-05-15 NOTE — Progress Notes (Deleted)
Office Visit Note  Patient: Nichole Reyes             Date of Birth: 08-11-1950           MRN: [id number]             PCP: Rory Percy, MD Referring: Rory Percy, MD Visit Date: 05/29/2018 Occupation: @GUAROCC @  Subjective:  No chief complaint on file.   History of Present Illness: Nichole Reyes is a 68 y.o. female ***   Activities of Daily Living:  Patient reports morning stiffness for *** {minute/hour:19697}.   Patient {ACTIONS;DENIES/REPORTS:21021675::"Denies"} nocturnal pain.  Difficulty dressing/grooming: {ACTIONS;DENIES/REPORTS:21021675::"Denies"} Difficulty climbing stairs: {ACTIONS;DENIES/REPORTS:21021675::"Denies"} Difficulty getting out of chair: {ACTIONS;DENIES/REPORTS:21021675::"Denies"} Difficulty using hands for taps, buttons, cutlery, and/or writing: {ACTIONS;DENIES/REPORTS:21021675::"Denies"}  No Rheumatology ROS completed.   PMFS History:  Patient Active Problem List   Diagnosis Date Noted  . Fibromyalgia syndrome 06/26/2016  . Other fatigue 06/26/2016  . Other insomnia 06/26/2016  . Arthralgia of both knees 06/26/2016  . Pain in both feet 06/26/2016  . Primary osteoarthritis of both feet 06/26/2016  . Primary osteoarthritis of both knees 06/26/2016  . DDD (degenerative disc disease), cervical 06/26/2016  . 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 DDD (degenerative disc disease), lumbar 06/26/2016  . History of hypertension 06/26/2016  . History of high cholesterol 06/26/2016  . Other sleep apnea 06/26/2016  . History of osteopenia 06/26/2016  . 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 THYROID NODULE, LEFT 01/03/2008  . HYPERLIPIDEMIA 01/03/2008  . DEPRESSION 01/03/2008  . MIGRAINE HEADACHE 01/03/2008  . ALLERGIC RHINITIS 01/03/2008  . ALOPECIA 01/03/2008  . ALLERGIC ARTHRITIS SITE UNSPECIFIED 01/03/2008    Past Medical History:  Diagnosis Date  . Diabetes mellitus without complication ([place])   . Fibromyalgia    . High cholesterol   . Osteoporosis     Family History  Problem Relation Age of Onset  . Hypertension Mother   . Diabetes Mother   . Osteoporosis Mother   . Hypertension Father   . Heart disease Father   . Cancer Sister   . Cancer Brother   . Heart disease Brother    Past Surgical History:  Procedure Laterality Date  . ABDOMINAL HYSTERECTOMY     Social History   Social History Narrative  . Not on file   Immunization History  Administered Date(s) Administered  . Influenza-Unspecified 11/18/2013     Objective: Vital Signs: There were no vitals taken for this visit.   Physical Exam   Musculoskeletal Exam: ***  CDAI Exam: CDAI Score: Not documented Patient Global Assessment: Not documented; Provider Global Assessment: Not documented Swollen: Not documented; Tender: Not documented Joint Exam   Not documented   There is currently no information documented on the homunculus. Go to the Rheumatology activity and complete the homunculus joint exam.  Investigation: No additional findings.  Imaging: No results found.  Recent Labs: Lab Results  Component Value Date   WBC 10.5 03/15/2017   HGB 14.6 03/15/2017   PLT 439 (H) 03/15/2017   NA 140 03/15/2017   K 4.8 03/15/2017   CL 103 03/15/2017   CO2 31 03/15/2017   GLUCOSE 104 (H) 03/15/2017   BUN 12 03/15/2017   CREATININE 0.73 03/15/2017   BILITOT 0.4 03/15/2017   ALKPHOS 51 06/28/2016   AST 39 (H) 03/15/2017   ALT 42 (H) 03/15/2017   PROT 7.0 03/15/2017   ALBUMIN 4.1 06/28/2016   CALCIUM 10.0 03/15/2017   GFRAA 101 03/15/2017  Speciality Comments: No specialty comments available.  Procedures:  No procedures performed Allergies: Pseudoephedrine and Sulfonamide derivatives   Assessment / Plan:     Visit Diagnoses: Fibromyalgia  Other fatigue  Other insomnia  Trapezius muscle spasm  Primary osteoarthritis of both knees  Primary osteoarthritis of both feet  DDD (degenerative disc  disease), cervical  DDD (degenerative disc disease), thoracic  DDD (degenerative disc disease), lumbar  History of hypertension  History of hypercholesterolemia  History of sleep apnea  History of migraine  History of depression  History of hyperlipidemia   Orders: No orders of the defined types were placed in this encounter.  No orders of the defined types were placed in this encounter.   Face-to-face time spent with patient was *** minutes. Greater than 50% of time was spent in counseling and coordination of care.  Follow-Up Instructions: No follow-ups on file.   Ofilia Neas, PA-C  Note - This record has been created using Dragon software.  Chart creation errors have been sought, but may not always  have been located. Such creation errors do not reflect on  the standard of medical care.

## 2018-05-29 ENCOUNTER — Ambulatory Visit: Payer: Self-pay | Admitting: Physician Assistant

## 2018-05-29 ENCOUNTER — Other Ambulatory Visit: Payer: Self-pay | Admitting: Rheumatology

## 2018-05-29 NOTE — Progress Notes (Deleted)
Office Visit Note  Patient: Nichole Reyes             Date of Birth: Oct 17, 1950           MRN: [id number]             PCP: Rory Percy, MD Referring: Rory Percy, MD Visit Date: 06/06/2018 Occupation: @GUAROCC @  Subjective:  No chief complaint on file.   History of Present Illness: Nichole Reyes is a 68 y.o. female ***   Activities of Daily Living:  Patient reports morning stiffness for *** {minute/hour:19697}.   Patient {ACTIONS;DENIES/REPORTS:21021675::"Denies"} nocturnal pain.  Difficulty dressing/grooming: {ACTIONS;DENIES/REPORTS:21021675::"Denies"} Difficulty climbing stairs: {ACTIONS;DENIES/REPORTS:21021675::"Denies"} Difficulty getting out of chair: {ACTIONS;DENIES/REPORTS:21021675::"Denies"} Difficulty using hands for taps, buttons, cutlery, and/or writing: {ACTIONS;DENIES/REPORTS:21021675::"Denies"}  No Rheumatology ROS completed.   PMFS History:  Patient Active Problem List   Diagnosis Date Noted  . Fibromyalgia syndrome 06/26/2016  . Other fatigue 06/26/2016  . Other insomnia 06/26/2016  . Arthralgia of both knees 06/26/2016  . Pain in both feet 06/26/2016  . Primary osteoarthritis of both feet 06/26/2016  . Primary osteoarthritis of both knees 06/26/2016  . DDD (degenerative disc disease), cervical 06/26/2016  . 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 DDD (degenerative disc disease), lumbar 06/26/2016  . History of hypertension 06/26/2016  . History of high cholesterol 06/26/2016  . Other sleep apnea 06/26/2016  . History of osteopenia 06/26/2016  . 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 THYROID NODULE, LEFT 01/03/2008  . HYPERLIPIDEMIA 01/03/2008  . DEPRESSION 01/03/2008  . MIGRAINE HEADACHE 01/03/2008  . ALLERGIC RHINITIS 01/03/2008  . ALOPECIA 01/03/2008  . ALLERGIC ARTHRITIS SITE UNSPECIFIED 01/03/2008    Past Medical History:  Diagnosis Date  . Diabetes mellitus without complication ([place])   . Fibromyalgia    . High cholesterol   . Osteoporosis     Family History  Problem Relation Age of Onset  . Hypertension Mother   . Diabetes Mother   . Osteoporosis Mother   . Hypertension Father   . Heart disease Father   . Cancer Sister   . Cancer Brother   . Heart disease Brother    Past Surgical History:  Procedure Laterality Date  . ABDOMINAL HYSTERECTOMY     Social History   Social History Narrative  . Not on file   Immunization History  Administered Date(s) Administered  . Influenza-Unspecified 11/18/2013     Objective: Vital Signs: There were no vitals taken for this visit.   Physical Exam   Musculoskeletal Exam: ***  CDAI Exam: CDAI Score: Not documented Patient Global Assessment: Not documented; Provider Global Assessment: Not documented Swollen: Not documented; Tender: Not documented Joint Exam   Not documented   There is currently no information documented on the homunculus. Go to the Rheumatology activity and complete the homunculus joint exam.  Investigation: No additional findings.  Imaging: No results found.  Recent Labs: Lab Results  Component Value Date   WBC 10.5 03/15/2017   HGB 14.6 03/15/2017   PLT 439 (H) 03/15/2017   NA 140 03/15/2017   K 4.8 03/15/2017   CL 103 03/15/2017   CO2 31 03/15/2017   GLUCOSE 104 (H) 03/15/2017   BUN 12 03/15/2017   CREATININE 0.73 03/15/2017   BILITOT 0.4 03/15/2017   ALKPHOS 51 06/28/2016   AST 39 (H) 03/15/2017   ALT 42 (H) 03/15/2017   PROT 7.0 03/15/2017   ALBUMIN 4.1 06/28/2016   CALCIUM 10.0 03/15/2017   GFRAA 101 03/15/2017  Speciality Comments: No specialty comments available.  Procedures:  No procedures performed Allergies: Pseudoephedrine and Sulfonamide derivatives   Assessment / Plan:     Visit Diagnoses: Fibromyalgia - Zanaflex 4 mg po at bedtime   Other fatigue  Other insomnia  Primary osteoarthritis of both knees  Primary osteoarthritis of both feet  DDD (degenerative disc  disease), cervical  DDD (degenerative disc disease), thoracic  DDD (degenerative disc disease), lumbar  History of hypertension  History of hypercholesterolemia  History of sleep apnea  History of migraine  History of depression  History of hyperlipidemia   Orders: No orders of the defined types were placed in this encounter.  No orders of the defined types were placed in this encounter.   Face-to-face time spent with patient was *** minutes. Greater than 50% of time was spent in counseling and coordination of care.  Follow-Up Instructions: No follow-ups on file.   Ofilia Neas, PA-C  Note - This record has been created using Dragon software.  Chart creation errors have been sought, but may not always  have been located. Such creation errors do not reflect on  the standard of medical care.

## 2018-05-29 NOTE — Telephone Encounter (Signed)
Last Visit: 09/25/17 Next Visit: 06/06/18  Okay to refill per Dr. Estanislado Pandy

## 2018-06-03 DIAGNOSIS — J0101 Acute recurrent maxillary sinusitis: Secondary | ICD-10-CM | POA: Diagnosis not present

## 2018-06-06 ENCOUNTER — Ambulatory Visit: Payer: Self-pay | Admitting: Physician Assistant

## 2018-06-26 ENCOUNTER — Other Ambulatory Visit: Payer: Self-pay | Admitting: Rheumatology

## 2018-06-27 NOTE — Telephone Encounter (Signed)
Last Visit: 09/25/17 Next Visit:08/22/18  Okay to refill per Dr. Estanislado Pandy

## 2018-07-25 ENCOUNTER — Other Ambulatory Visit: Payer: Self-pay | Admitting: Rheumatology

## 2018-07-25 NOTE — Telephone Encounter (Signed)
Last Visit: 09/25/17 Next Visit:08/22/18  Okay to refill per Dr. Estanislado Pandy

## 2018-08-08 NOTE — Progress Notes (Signed)
Office Visit Note  Patient: Nichole Reyes             Date of Birth: 03/23/1950           MRN: [id number]             PCP: Rory Percy, MD Referring: Rory Percy, MD Visit Date: 08/22/2018 Occupation: @GUAROCC @  Subjective:  Left shoulder pain  History of Present Illness: Nichole Reyes is a 68 y.o. female with history of fibromyalgia, osteoarthritis, and DDD.  According to patient she has been having pain and discomfort in her left shoulder for the last 3 months.  She states she was seen by her PCP and was given NSAIDs without help.  She also had a x-ray of her right shoulder joint which was unremarkable.  She has been having difficulty lifting her arm and sleeping on the side.  She has some discomfort in her other joints including knees and feet but is tolerable.  She continues to have some lower back and neck discomfort.  She has generalized pain from fibromyalgia.  Activities of Daily Living:  Patient reports morning stiffness for 0 minute.   Patient Reports nocturnal pain.  Difficulty dressing/grooming: Denies Difficulty climbing stairs: Denies Difficulty getting out of chair: Denies Difficulty using hands for taps, buttons, cutlery, and/or writing: Denies  Review of Systems  Constitutional: Positive for fatigue. Negative for night sweats, weight gain and weight loss.  HENT: Negative for mouth sores, trouble swallowing, trouble swallowing, mouth dryness and nose dryness.   Eyes: Negative for pain, redness, visual disturbance and dryness.  Respiratory: Negative for cough, hemoptysis, shortness of breath and difficulty breathing.   Cardiovascular: Negative for chest pain, palpitations, hypertension, irregular heartbeat and swelling in legs/feet.  Gastrointestinal: Negative for blood in stool, constipation and diarrhea.  Endocrine: Negative for increased urination.  Genitourinary: Negative for painful urination and vaginal dryness.  Musculoskeletal: Positive for  arthralgias, joint pain, myalgias, morning stiffness and myalgias. Negative for joint swelling, muscle weakness and muscle tenderness.  Skin: Negative for color change, pallor, rash, hair loss, nodules/bumps, skin tightness, ulcers and sensitivity to sunlight.  Allergic/Immunologic: Negative for susceptible to infections.  Neurological: Negative for dizziness, numbness, headaches, memory loss, night sweats and weakness.  Hematological: Negative for swollen glands.  Psychiatric/Behavioral: Negative for depressed mood and sleep disturbance. The patient is not nervous/anxious.     PMFS History:  Patient Active Problem List   Diagnosis Date Noted  . Fibromyalgia syndrome 06/26/2016  . Other fatigue 06/26/2016  . Other insomnia 06/26/2016  . Arthralgia of both knees 06/26/2016  . Pain in both feet 06/26/2016  . Primary osteoarthritis of both feet 06/26/2016  . Primary osteoarthritis of both knees 06/26/2016  . DDD (degenerative disc disease), cervical 06/26/2016  . 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 DDD (degenerative disc disease), lumbar 06/26/2016  . History of hypertension 06/26/2016  . History of high cholesterol 06/26/2016  . Other sleep apnea 06/26/2016  . History of osteopenia 06/26/2016  . 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 THYROID NODULE, LEFT 01/03/2008  . HYPERLIPIDEMIA 01/03/2008  . DEPRESSION 01/03/2008  . MIGRAINE HEADACHE 01/03/2008  . ALLERGIC RHINITIS 01/03/2008  . ALOPECIA 01/03/2008  . ALLERGIC ARTHRITIS SITE UNSPECIFIED 01/03/2008    Past Medical History:  Diagnosis Date  . Diabetes mellitus without complication ([place])   . Fibromyalgia   . High cholesterol   . Osteoporosis     Family History  Problem Relation Age of Onset  .  Hypertension Mother   . Diabetes Mother   . Osteoporosis Mother   . Hypertension Father   . Heart disease Father   . Cancer Sister   . Cancer Brother   . Heart disease Brother    Past Surgical  History:  Procedure Laterality Date  . ABDOMINAL HYSTERECTOMY     Social History   Social History Narrative  . Not on file   Immunization History  Administered Date(s) Administered  . Influenza-Unspecified 11/18/2013     Objective: Vital Signs: BP (!) 142/80 (BP Location: Left Arm, Patient Position: Sitting, Cuff Size: Small)   Pulse 87   Resp 12   Ht 5\' 6"  (1.676 m)   Wt 179 lb 3.2 oz (81.3 kg)   BMI 28.92 kg/m    Physical Exam Vitals signs and nursing note reviewed.  Constitutional:      Appearance: She is well-developed.  HENT:     Head: Normocephalic and atraumatic.  Eyes:     Conjunctiva/sclera: Conjunctivae normal.  Neck:     Musculoskeletal: Normal range of motion.  Cardiovascular:     Rate and Rhythm: Normal rate and regular rhythm.     Heart sounds: Normal heart sounds.  Pulmonary:     Effort: Pulmonary effort is normal.     Breath sounds: Normal breath sounds.  Abdominal:     General: Bowel sounds are normal.     Palpations: Abdomen is soft.  Lymphadenopathy:     Cervical: No cervical adenopathy.  Skin:    General: Skin is warm and dry.     Capillary Refill: Capillary refill takes less than 2 seconds.  Neurological:     Mental Status: She is alert and oriented to person, place, and time.  Psychiatric:        Behavior: Behavior normal.      Musculoskeletal Exam: C-spine thoracic and lumbar spine limited range of motion.  She had painful range of motion of her left shoulder.  Elbow joints wrist joint MCPs PIPs DIPs with good range of motion with no synovitis.  She has DIP and PIP thickening in her hands and feet.  She is crepitus in her knee joints without any warmth swelling or effusion.  She has generalized hyperalgesia.  CDAI Exam: CDAI Score: Not documented Patient Global Assessment: Not documented; Provider Global Assessment: Not documented Swollen: Not documented; Tender: Not documented Joint Exam   Not documented   There is currently no  information documented on the homunculus. Go to the Rheumatology activity and complete the homunculus joint exam.  Investigation: No additional findings.  Imaging: No results found.  Recent Labs: Lab Results  Component Value Date   WBC 10.5 03/15/2017   HGB 14.6 03/15/2017   PLT 439 (H) 03/15/2017   NA 140 03/15/2017   K 4.8 03/15/2017   CL 103 03/15/2017   CO2 31 03/15/2017   GLUCOSE 104 (H) 03/15/2017   BUN 12 03/15/2017   CREATININE 0.73 03/15/2017   BILITOT 0.4 03/15/2017   ALKPHOS 51 06/28/2016   AST 39 (H) 03/15/2017   ALT 42 (H) 03/15/2017   PROT 7.0 03/15/2017   ALBUMIN 4.1 06/28/2016   CALCIUM 10.0 03/15/2017   GFRAA 101 03/15/2017    Speciality Comments: No specialty comments available.  Procedures:  Large Joint Inj: L glenohumeral on 08/22/2018 1:38 PM Indications: pain Details: 27 G 1.5 in needle, posterior approach  Arthrogram: No  Medications: 40 mg triamcinolone acetonide 40 MG/ML; 1.5 mL lidocaine 1 % Aspirate: 0 mL Outcome: tolerated  well, no immediate complications Procedure, treatment alternatives, risks and benefits explained, specific risks discussed. Consent was given by the patient. Immediately prior to procedure a time out was called to verify the correct patient, procedure, equipment, support staff and site/side marked as required. Patient was prepped and draped in the usual sterile fashion.     Allergies: Latex; Pseudoephedrine; and Sulfonamide derivatives   Assessment / Plan:     Visit Diagnoses: Fibromyalgia -she is to have some generalized pain and discomfort.  She has been taking Zanaflex 4 mg at bedtime for muscle spasms which has been helpful.  Other fatigue-secondary to insomnia.  Other insomnia-good sleep hygiene was discussed.  Chronic left shoulder pain-she has been experiencing of pain and discomfort in the left shoulder.  She states she has tried NSAIDs.  Her x-ray was unremarkable per patient.  Per her request after informed  consent was obtained left shoulder joint was injected with cortisone as described above.  Have given her a handout on shoulder exercises.  Primary osteoarthritis of both knees-she has pain in her knee joints.  Weight loss diet and exercise was discussed.  Primary osteoarthritis of both feet-proper fitting shoes were discussed.  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  DDD (degenerative disc disease), lumbar-chronic pain  History of hypertension-I have advised her to monitor her blood pressure after the cortisone injection.  History of hypercholesterolemia  History of sleep apnea  History of migraine  History of depression  History of hyperlipidemia   Orders: Orders Placed This Encounter  Procedures  . Large Joint Inj   No orders of the defined types were placed in this encounter.     Follow-Up Instructions: Return in about 6 months (around 02/21/2019) for FMS, OA.   Bo Merino, MD  Note - This record has been created using Editor, commissioning.  Chart creation errors have been sought, but may not always  have been located. Such creation errors do not reflect on  the standard of medical care.

## 2018-08-16 DIAGNOSIS — E7800 Pure hypercholesterolemia, unspecified: Secondary | ICD-10-CM | POA: Diagnosis not present

## 2018-08-16 DIAGNOSIS — N951 Menopausal and female climacteric states: Secondary | ICD-10-CM | POA: Diagnosis not present

## 2018-08-16 DIAGNOSIS — G4730 Sleep apnea, unspecified: Secondary | ICD-10-CM | POA: Diagnosis not present

## 2018-08-16 DIAGNOSIS — R739 Hyperglycemia, unspecified: Secondary | ICD-10-CM | POA: Diagnosis not present

## 2018-08-22 ENCOUNTER — Encounter: Payer: Self-pay | Admitting: Physician Assistant

## 2018-08-22 ENCOUNTER — Ambulatory Visit: Payer: Medicare Other | Admitting: Rheumatology

## 2018-08-22 ENCOUNTER — Other Ambulatory Visit: Payer: Self-pay

## 2018-08-22 VITALS — BP 142/80 | HR 87 | Resp 12 | Ht 66.0 in | Wt 179.2 lb

## 2018-08-22 DIAGNOSIS — Z8639 Personal history of other endocrine, nutritional and metabolic disease: Secondary | ICD-10-CM

## 2018-08-22 DIAGNOSIS — Z8679 Personal history of other diseases of the circulatory system: Secondary | ICD-10-CM

## 2018-08-22 DIAGNOSIS — M19072 Primary osteoarthritis, left ankle and foot: Secondary | ICD-10-CM

## 2018-08-22 DIAGNOSIS — R5383 Other fatigue: Secondary | ICD-10-CM

## 2018-08-22 DIAGNOSIS — M170 Bilateral primary osteoarthritis of knee: Secondary | ICD-10-CM

## 2018-08-22 DIAGNOSIS — M5134 Other intervertebral disc degeneration, thoracic region: Secondary | ICD-10-CM

## 2018-08-22 DIAGNOSIS — Z8659 Personal history of other mental and behavioral disorders: Secondary | ICD-10-CM

## 2018-08-22 DIAGNOSIS — M797 Fibromyalgia: Secondary | ICD-10-CM

## 2018-08-22 DIAGNOSIS — G4709 Other insomnia: Secondary | ICD-10-CM | POA: Diagnosis not present

## 2018-08-22 DIAGNOSIS — M5030 Other cervical disc degeneration, unspecified cervical region: Secondary | ICD-10-CM

## 2018-08-22 DIAGNOSIS — M25512 Pain in left shoulder: Secondary | ICD-10-CM

## 2018-08-22 DIAGNOSIS — G8929 Other chronic pain: Secondary | ICD-10-CM

## 2018-08-22 DIAGNOSIS — M19071 Primary osteoarthritis, right ankle and foot: Secondary | ICD-10-CM

## 2018-08-22 DIAGNOSIS — M5136 Other intervertebral disc degeneration, lumbar region: Secondary | ICD-10-CM

## 2018-08-22 DIAGNOSIS — Z8669 Personal history of other diseases of the nervous system and sense organs: Secondary | ICD-10-CM

## 2018-08-22 MED ORDER — LIDOCAINE HCL 1 % IJ SOLN
1.5000 mL | INTRAMUSCULAR | Status: AC | PRN
  Administered 2018-08-22: 1.5 mL

## 2018-08-22 MED ORDER — TRIAMCINOLONE ACETONIDE 40 MG/ML IJ SUSP
40.0000 mg | INTRAMUSCULAR | Status: AC | PRN
  Administered 2018-08-22: 40 mg via INTRA_ARTICULAR

## 2018-08-22 NOTE — Patient Instructions (Signed)
Shoulder Exercises Ask your health care provider which exercises are safe for you. Do exercises exactly as told by your health care provider and adjust them as directed. It is normal to feel mild stretching, pulling, tightness, or discomfort as you do these exercises, but you should stop right away if you feel sudden pain or your pain gets worse.Do not begin these exercises until told by your health care provider. Range of Motion Exercises        These exercises warm up your muscles and joints and improve the movement and flexibility of your shoulder. These exercises also help to relieve pain, numbness, and tingling. These exercises involve stretching your injured shoulder directly. Exercise A: Pendulum 1. Stand near a wall or a surface that you can hold onto for balance. 2. Bend at the waist and let your left / right arm hang straight down. Use your other arm to support you. Keep your back straight and do not lock your knees. 3. Relax your left / right arm and shoulder muscles, and move your hips and your trunk so your left / right arm swings freely. Your arm should swing because of the motion of your body, not because you are using your arm or shoulder muscles. 4. Keep moving your body so your arm swings in the following directions, as told by your health care provider: ? Side to side. ? Forward and backward. ? In clockwise and counterclockwise circles. 5. Continue each motion for __________ seconds, or for as long as told by your health care provider. 6. Slowly return to the starting position. Repeat __________ times. Complete this exercise __________ times a day. Exercise B:Flexion, Standing 1. Stand and hold a broomstick, a cane, or a similar object. Place your hands a little more than shoulder-width apart on the object. Your left / right hand should be palm-up, and your other hand should be palm-down. 2. Keep your elbow straight and keep your shoulder muscles relaxed. Push the stick  down with your healthy arm to raise your left / right arm in front of your body, and then over your head until you feel a stretch in your shoulder. ? Avoid shrugging your shoulder while you raise your arm. Keep your shoulder blade tucked down toward the middle of your back. 3. Hold for __________ seconds. 4. Slowly return to the starting position. Repeat __________ times. Complete this exercise __________ times a day. Exercise C: Abduction, Standing 1. Stand and hold a broomstick, a cane, or a similar object. Place your hands a little more than shoulder-width apart on the object. Your left / right hand should be palm-up, and your other hand should be palm-down. 2. While keeping your elbow straight and your shoulder muscles relaxed, push the stick across your body toward your left / right side. Raise your left / right arm to the side of your body and then over your head until you feel a stretch in your shoulder. ? Do not raise your arm above shoulder height, unless your health care provider tells you to do that. ? Avoid shrugging your shoulder while you raise your arm. Keep your shoulder blade tucked down toward the middle of your back. 3. Hold for __________ seconds. 4. Slowly return to the starting position. Repeat __________ times. Complete this exercise __________ times a day. Exercise D:Internal Rotation 1. Place your left / right hand behind your back, palm-up. 2. Use your other hand to dangle an exercise band, a towel, or a similar object over your shoulder.   Grasp the band with your left / right hand so you are holding onto both ends. 3. Gently pull up on the band until you feel a stretch in the front of your left / right shoulder. ? Avoid shrugging your shoulder while you raise your arm. Keep your shoulder blade tucked down toward the middle of your back. 4. Hold for __________ seconds. 5. Release the stretch by letting go of the band and lowering your hands. Repeat __________ times.  Complete this exercise __________ times a day. Stretching Exercises  These exercises warm up your muscles and joints and improve the movement and flexibility of your shoulder. These exercises also help to relieve pain, numbness, and tingling. These exercises are done using your healthy shoulder to help stretch the muscles of your injured shoulder. Exercise E: Corner Stretch (External Rotation and Abduction) 1. Stand in a doorway with one of your feet slightly in front of the other. This is called a staggered stance. If you cannot reach your forearms to the door frame, stand facing a corner of a room. 2. Choose one of the following positions as told by your health care provider: ? Place your hands and forearms on the door frame above your head. ? Place your hands and forearms on the door frame at the height of your head. ? Place your hands on the door frame at the height of your elbows. 3. Slowly move your weight onto your front foot until you feel a stretch across your chest and in the front of your shoulders. Keep your head and chest upright and keep your abdominal muscles tight. 4. Hold for __________ seconds. 5. To release the stretch, shift your weight to your back foot. Repeat __________ times. Complete this stretch __________ times a day. Exercise F:Extension, Standing 1. Stand and hold a broomstick, a cane, or a similar object behind your back. ? Your hands should be a little wider than shoulder-width apart. ? Your palms should face away from your back. 2. Keeping your elbows straight and keeping your shoulder muscles relaxed, move the stick away from your body until you feel a stretch in your shoulder. ? Avoid shrugging your shoulders while you move the stick. Keep your shoulder blade tucked down toward the middle of your back. 3. Hold for __________ seconds. 4. Slowly return to the starting position. Repeat __________ times. Complete this exercise __________ times a  day. Strengthening Exercises           These exercises build strength and endurance in your shoulder. Endurance is the ability to use your muscles for a long time, even after they get tired. Exercise G:External Rotation 1. Sit in a stable chair without armrests. 2. Secure an exercise band at elbow height on your left / right side. 3. Place a soft object, such as a folded towel or a small pillow, between your left / right upper arm and your body to move your elbow a few inches away (about 10 cm) from your side. 4. Hold the end of the band so it is tight and there is no slack. 5. Keeping your elbow pressed against the soft object, move your left / right forearm out, away from your abdomen. Keep your body steady so only your forearm moves. 6. Hold for __________ seconds. 7. Slowly return to the starting position. Repeat __________ times. Complete this exercise __________ times a day. Exercise H:Shoulder Abduction 1. Sit in a stable chair without armrests, or stand. 2. Hold a __________ weight in your   left / right hand, or hold an exercise band with both hands. 3. Start with your arms straight down and your left / right palm facing in, toward your body. 4. Slowly lift your left / right hand out to your side. Do not lift your hand above shoulder height unless your health care provider tells you that this is safe. ? Keep your arms straight. ? Avoid shrugging your shoulder while you do this movement. Keep your shoulder blade tucked down toward the middle of your back. 5. Hold for __________ seconds. 6. Slowly lower your arm, and return to the starting position. Repeat __________ times. Complete this exercise __________ times a day. Exercise I:Shoulder Extension 1. Sit in a stable chair without armrests, or stand. 2. Secure an exercise band to a stable object in front of you where it is at shoulder height. 3. Hold one end of the exercise band in each hand. Your palms should face each  other. 4. Straighten your elbows and lift your hands up to shoulder height. 5. Step back, away from the secured end of the exercise band, until the band is tight and there is no slack. 6. Squeeze your shoulder blades together as you pull your hands down to the sides of your thighs. Stop when your hands are straight down by your sides. Do not let your hands go behind your body. 7. Hold for __________ seconds. 8. Slowly return to the starting position. Repeat __________ times. Complete this exercise __________ times a day. Exercise J:Standing Shoulder Row 1. Sit in a stable chair without armrests, or stand. 2. Secure an exercise band to a stable object in front of you so it is at waist height. 3. Hold one end of the exercise band in each hand. Your palms should be in a thumbs-up position. 4. Bend each of your elbows to an "L" shape (about 90 degrees) and keep your upper arms at your sides. 5. Step back until the band is tight and there is no slack. 6. Slowly pull your elbows back behind you. 7. Hold for __________ seconds. 8. Slowly return to the starting position. Repeat __________ times. Complete this exercise __________ times a day. Exercise K:Shoulder Press-Ups 1. Sit in a stable chair that has armrests. Sit upright, with your feet flat on the floor. 2. Put your hands on the armrests so your elbows are bent and your fingers are pointing forward. Your hands should be about even with the sides of your body. 3. Push down on the armrests and use your arms to lift yourself off of the chair. Straighten your elbows and lift yourself up as much as you comfortably can. ? Move your shoulder blades down, and avoid letting your shoulders move up toward your ears. ? Keep your feet on the ground. As you get stronger, your feet should support less of your body weight as you lift yourself up. 4. Hold for __________ seconds. 5. Slowly lower yourself back into the chair. Repeat __________ times. Complete  this exercise __________ times a day. Exercise L: Wall Push-Ups 1. Stand so you are facing a stable wall. Your feet should be about one arm-length away from the wall. 2. Lean forward and place your palms on the wall at shoulder height. 3. Keep your feet flat on the floor as you bend your elbows and lean forward toward the wall. 4. Hold for __________ seconds. 5. Straighten your elbows to push yourself back to the starting position. Repeat __________ times. Complete this exercise __________ times   a day. This information is not intended to replace advice given to you by your health care provider. Make sure you discuss any questions you have with your health care provider. Document Released: 01/18/2005 Document Revised: 07/10/2017 Document Reviewed: 11/15/2014 [employer] Interactive Patient Education  2019 [employer]  

## 2018-08-23 DIAGNOSIS — I10 Essential (primary) hypertension: Secondary | ICD-10-CM | POA: Diagnosis not present

## 2018-08-23 DIAGNOSIS — R079 Chest pain, unspecified: Secondary | ICD-10-CM | POA: Diagnosis not present

## 2018-08-28 ENCOUNTER — Other Ambulatory Visit: Payer: Self-pay | Admitting: Rheumatology

## 2018-08-29 NOTE — Telephone Encounter (Signed)
Last fill 07/25/2018 for 30 day supply.  Okay to fill.

## 2018-08-29 NOTE — Telephone Encounter (Signed)
Please advise 

## 2018-09-03 DIAGNOSIS — Z1159 Encounter for screening for other viral diseases: Secondary | ICD-10-CM | POA: Diagnosis not present

## 2018-09-05 DIAGNOSIS — Z882 Allergy status to sulfonamides status: Secondary | ICD-10-CM | POA: Diagnosis not present

## 2018-09-05 DIAGNOSIS — Z888 Allergy status to other drugs, medicaments and biological substances status: Secondary | ICD-10-CM | POA: Diagnosis not present

## 2018-09-05 DIAGNOSIS — K644 Residual hemorrhoidal skin tags: Secondary | ICD-10-CM | POA: Diagnosis not present

## 2018-09-05 DIAGNOSIS — K449 Diaphragmatic hernia without obstruction or gangrene: Secondary | ICD-10-CM | POA: Diagnosis not present

## 2018-09-05 DIAGNOSIS — Z86010 Personal history of colonic polyps: Secondary | ICD-10-CM | POA: Diagnosis not present

## 2018-09-05 DIAGNOSIS — I10 Essential (primary) hypertension: Secondary | ICD-10-CM | POA: Diagnosis not present

## 2018-09-05 DIAGNOSIS — Z1211 Encounter for screening for malignant neoplasm of colon: Secondary | ICD-10-CM | POA: Diagnosis not present

## 2018-09-05 DIAGNOSIS — K621 Rectal polyp: Secondary | ICD-10-CM | POA: Diagnosis not present

## 2018-09-05 DIAGNOSIS — Z91040 Latex allergy status: Secondary | ICD-10-CM | POA: Diagnosis not present

## 2018-09-05 DIAGNOSIS — Z800 Family history of malignant neoplasm of digestive organs: Secondary | ICD-10-CM | POA: Diagnosis not present

## 2018-09-05 DIAGNOSIS — D128 Benign neoplasm of rectum: Secondary | ICD-10-CM | POA: Diagnosis not present

## 2018-09-05 DIAGNOSIS — M797 Fibromyalgia: Secondary | ICD-10-CM | POA: Diagnosis not present

## 2018-09-06 DIAGNOSIS — R05 Cough: Secondary | ICD-10-CM | POA: Diagnosis not present

## 2018-09-06 DIAGNOSIS — I517 Cardiomegaly: Secondary | ICD-10-CM | POA: Diagnosis not present

## 2018-09-06 DIAGNOSIS — R55 Syncope and collapse: Secondary | ICD-10-CM | POA: Diagnosis not present

## 2018-09-06 DIAGNOSIS — I10 Essential (primary) hypertension: Secondary | ICD-10-CM | POA: Diagnosis not present

## 2018-09-11 ENCOUNTER — Encounter: Payer: Self-pay | Admitting: Cardiovascular Disease

## 2018-09-11 DIAGNOSIS — Z7984 Long term (current) use of oral hypoglycemic drugs: Secondary | ICD-10-CM | POA: Diagnosis not present

## 2018-09-11 DIAGNOSIS — E119 Type 2 diabetes mellitus without complications: Secondary | ICD-10-CM | POA: Diagnosis not present

## 2018-09-11 DIAGNOSIS — R079 Chest pain, unspecified: Secondary | ICD-10-CM | POA: Diagnosis not present

## 2018-09-11 DIAGNOSIS — M797 Fibromyalgia: Secondary | ICD-10-CM | POA: Diagnosis not present

## 2018-09-11 DIAGNOSIS — I700 Atherosclerosis of aorta: Secondary | ICD-10-CM | POA: Diagnosis not present

## 2018-09-11 DIAGNOSIS — Z7982 Long term (current) use of aspirin: Secondary | ICD-10-CM | POA: Diagnosis not present

## 2018-09-11 DIAGNOSIS — R0600 Dyspnea, unspecified: Secondary | ICD-10-CM | POA: Diagnosis not present

## 2018-09-11 DIAGNOSIS — I517 Cardiomegaly: Secondary | ICD-10-CM | POA: Diagnosis not present

## 2018-09-11 DIAGNOSIS — Z79899 Other long term (current) drug therapy: Secondary | ICD-10-CM | POA: Diagnosis not present

## 2018-09-11 DIAGNOSIS — R0602 Shortness of breath: Secondary | ICD-10-CM | POA: Diagnosis not present

## 2018-09-11 DIAGNOSIS — I10 Essential (primary) hypertension: Secondary | ICD-10-CM | POA: Diagnosis not present

## 2018-09-12 DIAGNOSIS — I517 Cardiomegaly: Secondary | ICD-10-CM | POA: Diagnosis not present

## 2018-09-12 DIAGNOSIS — I10 Essential (primary) hypertension: Secondary | ICD-10-CM | POA: Diagnosis not present

## 2018-09-12 DIAGNOSIS — E119 Type 2 diabetes mellitus without complications: Secondary | ICD-10-CM | POA: Diagnosis not present

## 2018-09-12 DIAGNOSIS — R079 Chest pain, unspecified: Secondary | ICD-10-CM | POA: Diagnosis not present

## 2018-09-12 DIAGNOSIS — R0600 Dyspnea, unspecified: Secondary | ICD-10-CM | POA: Diagnosis not present

## 2018-09-13 DIAGNOSIS — R0602 Shortness of breath: Secondary | ICD-10-CM | POA: Diagnosis not present

## 2018-09-13 DIAGNOSIS — R55 Syncope and collapse: Secondary | ICD-10-CM | POA: Diagnosis not present

## 2018-09-13 DIAGNOSIS — I10 Essential (primary) hypertension: Secondary | ICD-10-CM | POA: Diagnosis not present

## 2018-09-24 DIAGNOSIS — I2510 Atherosclerotic heart disease of native coronary artery without angina pectoris: Secondary | ICD-10-CM | POA: Diagnosis not present

## 2018-09-24 DIAGNOSIS — R918 Other nonspecific abnormal finding of lung field: Secondary | ICD-10-CM | POA: Diagnosis not present

## 2018-09-24 DIAGNOSIS — J8410 Pulmonary fibrosis, unspecified: Secondary | ICD-10-CM | POA: Diagnosis not present

## 2018-09-24 DIAGNOSIS — I700 Atherosclerosis of aorta: Secondary | ICD-10-CM | POA: Diagnosis not present

## 2018-09-25 DIAGNOSIS — R040 Epistaxis: Secondary | ICD-10-CM | POA: Diagnosis not present

## 2018-09-25 DIAGNOSIS — J8410 Pulmonary fibrosis, unspecified: Secondary | ICD-10-CM | POA: Diagnosis not present

## 2018-09-25 DIAGNOSIS — R0602 Shortness of breath: Secondary | ICD-10-CM | POA: Diagnosis not present

## 2018-09-27 ENCOUNTER — Other Ambulatory Visit: Payer: Self-pay | Admitting: Rheumatology

## 2018-09-27 NOTE — Telephone Encounter (Signed)
Last Visit: 08/22/18 Next Visit: 02/20/19  Okay to refill per Dr. Estanislado Pandy

## 2018-10-12 DIAGNOSIS — R0602 Shortness of breath: Secondary | ICD-10-CM | POA: Diagnosis not present

## 2018-10-12 DIAGNOSIS — R55 Syncope and collapse: Secondary | ICD-10-CM | POA: Diagnosis not present

## 2018-10-12 DIAGNOSIS — R05 Cough: Secondary | ICD-10-CM | POA: Diagnosis not present

## 2018-10-12 DIAGNOSIS — I10 Essential (primary) hypertension: Secondary | ICD-10-CM | POA: Diagnosis not present

## 2018-10-12 DIAGNOSIS — I517 Cardiomegaly: Secondary | ICD-10-CM | POA: Diagnosis not present

## 2018-10-14 ENCOUNTER — Ambulatory Visit: Payer: Medicare Other | Admitting: Cardiovascular Disease

## 2018-10-16 ENCOUNTER — Encounter: Payer: Self-pay | Admitting: *Deleted

## 2018-10-17 ENCOUNTER — Encounter: Payer: Self-pay | Admitting: Cardiovascular Disease

## 2018-10-17 ENCOUNTER — Ambulatory Visit (INDEPENDENT_AMBULATORY_CARE_PROVIDER_SITE_OTHER): Payer: Medicare Other | Admitting: Cardiovascular Disease

## 2018-10-17 ENCOUNTER — Encounter: Payer: Self-pay | Admitting: *Deleted

## 2018-10-17 VITALS — BP 143/83 | HR 82 | Ht 66.0 in | Wt 180.2 lb

## 2018-10-17 DIAGNOSIS — I10 Essential (primary) hypertension: Secondary | ICD-10-CM | POA: Diagnosis not present

## 2018-10-17 DIAGNOSIS — J8410 Pulmonary fibrosis, unspecified: Secondary | ICD-10-CM | POA: Diagnosis not present

## 2018-10-17 DIAGNOSIS — R0789 Other chest pain: Secondary | ICD-10-CM

## 2018-10-17 DIAGNOSIS — R202 Paresthesia of skin: Secondary | ICD-10-CM

## 2018-10-17 DIAGNOSIS — R0602 Shortness of breath: Secondary | ICD-10-CM

## 2018-10-17 DIAGNOSIS — R200 Anesthesia of skin: Secondary | ICD-10-CM

## 2018-10-17 NOTE — Patient Instructions (Addendum)
Medication Instructions:  Continue all current medications.  Labwork: none  Testing/Procedures: none  Follow-Up: 6-8 weeks   Any Other Special Instructions Will Be Listed Below (If Applicable).  If you need a refill on your cardiac medications before your next appointment, please call your pharmacy.

## 2018-10-17 NOTE — Progress Notes (Signed)
CARDIOLOGY CONSULT NOTE  Patient ID: Nichole Reyes MRN: [id number] DOB/AGE: Nov 27, 1950 68 y.o.  Admit date: (Not on file) Primary Physician: Rory Percy, MD  Referring provider: Amy R. Georgianne Fick   Reason for Consultation: Chest heaviness and shortness of breath  HPI: Nichole Reyes is a 68 y.o. female who is being seen today for the evaluation of chest heaviness and shortness of breath at the request of Amy R. Georgianne Fick.  Past medical history includes diabetes, hypertension, and fibromyalgia.  I reviewed notes and labs from her PCP.  Labs on 01/16/2018: BUN 10, creatinine 0.78, sodium 139, potassium 4.8, total cholesterol 248, LDL 145, triglycerides 135, HDL 76, hemoglobin 14, platelets 442, TSH 2.85, A1c 6.2%, vitamin D 42.8.  According to office notes, she had a negative stress test on 01/05/2015.  Upon speaking with her, it appears she was hospitalized at [facility] 4 weeks ago.  She had chest heaviness and shortness of breath and apparently underwent an echocardiogram, a stress test, and a chest CT.  She was told she had "pulmonary interstitial fibrosis ".  She was also told that the ventricular walls were thick and that she had plaque disease.  A chest x-ray reportedly showed "an enlarged heart ".  Shortly before the pandemic began, she was walking 30 to 35 minutes on a treadmill at [employer].  Shortly thereafter she could only walk on a treadmill for about 20 minutes as her shortness of breath became worse.  She denies palpitations, orthopnea, and paroxysmal nocturnal dyspnea.  She has chest heaviness associated with the shortness of breath.  There is no radiation to the jaw, back, or arms.  She has swelling of her toes bilaterally and a burning sensation on the bottom of her feet.  She said her husband is noticed that her feet will suddenly turn red.  Family history: Mother had strokes.  Father had an MI in his late 44s.  Brother had an MI at age 51.    Allergies  Allergen Reactions  . Latex   . Pseudoephedrine   . Sulfonamide Derivatives     Current Outpatient Medications  Medication Sig Dispense Refill  . ACCU-CHEK AVIVA PLUS test strip     . Accu-Chek Softclix Lancets lancets     . aspirin EC 81 MG tablet Take 81 mg by mouth daily.    . Blood Glucose Monitoring Suppl (ACCU-CHEK AVIVA PLUS) w/Device KIT     . Calcium Carbonate (CALTRATE 600 PO) Take 1 tablet by mouth 2 (two) times a day.    . clonazePAM (KLONOPIN) 0.5 MG tablet Take 0.5 mg by mouth daily.     . fenofibrate (TRICOR) 48 MG tablet Take 48 mg by mouth daily.     Marland Kitchen lisinopril (ZESTRIL) 20 MG tablet Take 1 tablet by mouth daily.    . metFORMIN (GLUCOPHAGE-XR) 500 MG 24 hr tablet Take 500 mg by mouth daily with breakfast.     . metoprolol succinate (TOPROL-XL) 50 MG 24 hr tablet Take 50 mg by mouth daily.     Marland Kitchen SAVELLA 50 MG TABS tablet Take 50 mg by mouth 2 (two) times daily.     Marland Kitchen tiZANidine (ZANAFLEX) 4 MG tablet TAKE 1 TABLET BY MOUTH AT BEDTIME. 30 tablet 0  . vitamin E 400 UNIT capsule Take 400 Units by mouth daily.     No current facility-administered medications for this visit.     Past Medical History:  Diagnosis Date  .  Diabetes mellitus without complication ([place])   . Fibromyalgia   . High cholesterol   . Osteoporosis     Past Surgical History:  Procedure Laterality Date  . ABDOMINAL HYSTERECTOMY      Social History   Socioeconomic History  . Marital status: Married    Spouse name: Not on file  . Number of children: Not on file  . Years of education: Not on file  . Highest education level: Not on file  Occupational History  . Not on file  Social Needs  . Financial resource strain: Not on file  . Food insecurity    Worry: Not on file    Inability: Not on file  . Transportation needs    Medical: Not on file    Non-medical: Not on file  Tobacco Use  . Smoking status: Never Smoker  . Smokeless tobacco: Never Used  Substance and Sexual  Activity  . Alcohol use: Yes    Comment: ocassional  . Drug use: No  . Sexual activity: Not on file  Lifestyle  . Physical activity    Days per week: Not on file    Minutes per session: Not on file  . Stress: Not on file  Relationships  . Social Herbalist on phone: Not on file    Gets together: Not on file    Attends religious service: Not on file    Active member of club or organization: Not on file    Attends meetings of clubs or organizations: Not on file    Relationship status: Not on file  . Intimate partner violence    Fear of current or ex partner: Not on file    Emotionally abused: Not on file    Physically abused: Not on file    Forced sexual activity: Not on file  Other Topics Concern  . Not on file  Social History Narrative  . Not on file      Current Meds  Medication Sig  . ACCU-CHEK AVIVA PLUS test strip   . Accu-Chek Softclix Lancets lancets   . aspirin EC 81 MG tablet Take 81 mg by mouth daily.  . Blood Glucose Monitoring Suppl (ACCU-CHEK AVIVA PLUS) w/Device KIT   . Calcium Carbonate (CALTRATE 600 PO) Take 1 tablet by mouth 2 (two) times a day.  . clonazePAM (KLONOPIN) 0.5 MG tablet Take 0.5 mg by mouth daily.   . fenofibrate (TRICOR) 48 MG tablet Take 48 mg by mouth daily.   Marland Kitchen lisinopril (ZESTRIL) 20 MG tablet Take 1 tablet by mouth daily.  . metFORMIN (GLUCOPHAGE-XR) 500 MG 24 hr tablet Take 500 mg by mouth daily with breakfast.   . metoprolol succinate (TOPROL-XL) 50 MG 24 hr tablet Take 50 mg by mouth daily.   Marland Kitchen SAVELLA 50 MG TABS tablet Take 50 mg by mouth 2 (two) times daily.   Marland Kitchen tiZANidine (ZANAFLEX) 4 MG tablet TAKE 1 TABLET BY MOUTH AT BEDTIME.  . vitamin E 400 UNIT capsule Take 400 Units by mouth daily.      Review of systems complete and found to be negative unless listed above in HPI    Physical exam Blood pressure (!) 143/83, pulse 82, height _0  (1.676 m), weight 180 lb 3.2 oz (81.7 kg), SpO2 99 %. General: NAD Neck:  No JVD, no thyromegaly or thyroid nodule.  Lungs: Clear to auscultation bilaterally with normal respiratory effort. CV: Nondisplaced PMI. Regular rate and rhythm, normal S1/S2, no S3/S4, no murmur.  No peripheral edema.  No carotid bruit.  Normal pedal pulses bilaterally. Abdomen: Soft, nontender, no distention.  Skin: Intact without lesions or rashes.  Neurologic: Alert and oriented x 3.  Psych: Normal affect. Extremities: No clubbing or cyanosis.  HEENT: Normal.   ECG: Most recent ECG reviewed.   Labs: Lab Results  Component Value Date/Time   K 4.8 03/15/2017 02:57 PM   BUN 12 03/15/2017 02:57 PM   CREATININE 0.73 03/15/2017 02:57 PM   ALT 42 (H) 03/15/2017 02:57 PM   TSH 0.55 09/17/2008 03:28 PM   HGB 14.6 03/15/2017 02:57 PM     Lipids: No results found for: LDLCALC, LDLDIRECT, CHOL, TRIG, HDL      ASSESSMENT AND PLAN:  1.  Chest heaviness and shortness of breath: It appears she has already undergone cardiac testing with an echocardiogram and stress test. I will request all these records from [facility].  In the meantime I told her to keep a symptom diary to see what are the exacerbating and alleviating factors. Addendum: I received chest CT results which demonstrated mild to moderate pulmonary fibrosis.  There also appears to be bronchiectasis.  There was no honeycombing seen.  There was also a nodular opacity of the inferior posterior right upper lobe.  Coronary artery and aortic atherosclerosis were also seen.  2.  Hypertension: Blood pressure is mildly elevated.  No changes to therapy for now.  I will wait to review echocardiogram results.  3.  Bilateral feet redness/tingling and numbness: No significant PAD noted on physical exam.  No features of Raynaud's phenomenon per se.  It does not encompass the entire sole of the foot to suggest a peripheral neuropathy.  4.  Pulmonary fibrosis: She is scheduled to see pulmonary in [place] in the near future.     Disposition:  Follow up in 6-8 weeks  Signed: Kate Sable, M.D., F.A.C.C.  10/17/2018, 3:50 PM

## 2018-10-24 ENCOUNTER — Telehealth: Payer: Self-pay | Admitting: *Deleted

## 2018-10-24 MED ORDER — EZETIMIBE 10 MG PO TABS: 10.0000 mg | ORAL_TABLET | Freq: Every day | ORAL | 6 refills | Status: DC

## 2018-10-24 NOTE — Telephone Encounter (Signed)
Notes recorded by Laurine Blazer, LPN on 2/0/8022 at 33:61 AM EDT  Patient notified. Will send new prescription to [facility] today.  ------   Notes recorded by Herminio Commons, MD on 10/22/2018 at 10:18 AM EDT  That will not work to reduce cholesterol. Can try Zetia 10 mg daily (not a statin).  ------   Notes recorded by Laurine Blazer, LPN on 04/22/4495 at 53:00 AM EDT  Patient notified. Stated that her pmd has tried her on multiple statins in the past & she has never been able to tolerate them. She is on Fenofibrate 48mg  daily now & is tolerating this fine. Is going up on the medication an option for her ?  ------   Notes recorded by Herminio Commons, MD on 10/19/2018 at 3:13 PM EDT  I would consider starting Crestor 20 mg daily.

## 2018-10-28 ENCOUNTER — Other Ambulatory Visit: Payer: Self-pay | Admitting: Rheumatology

## 2018-10-28 NOTE — Telephone Encounter (Signed)
Last Visit: 08/22/18 Next Visit: 02/20/19  Okay to refill per Dr. Estanislado Pandy

## 2018-11-19 ENCOUNTER — Other Ambulatory Visit: Payer: Self-pay

## 2018-11-19 ENCOUNTER — Encounter: Payer: Self-pay | Admitting: Critical Care Medicine

## 2018-11-19 ENCOUNTER — Ambulatory Visit: Payer: Medicare Other | Admitting: Critical Care Medicine

## 2018-11-19 VITALS — BP 130/74 | HR 89 | Temp 97.2°F | Ht 66.0 in | Wt 178.0 lb

## 2018-11-19 DIAGNOSIS — J849 Interstitial pulmonary disease, unspecified: Secondary | ICD-10-CM

## 2018-11-19 DIAGNOSIS — R0609 Other forms of dyspnea: Secondary | ICD-10-CM

## 2018-11-19 LAB — C-REACTIVE PROTEIN: CRP: <1 mg/dL (ref 0.5–20.0)

## 2018-11-19 LAB — SEDIMENTATION RATE: Sed Rate: 17 mm/hr (ref 0–30)

## 2018-11-19 NOTE — Addendum Note (Signed)
Addended by: Suzzanne Cloud E on: 11/19/2018 02:57 PM   Modules accepted: Orders

## 2018-11-19 NOTE — Patient Instructions (Addendum)
Thank you for visiting Dr. Carlis Abbott at [facility]. We recommend the following: Orders Placed This Encounter  Procedures  . Aldolase  . ANA Comprehensive Panel  . ANA w/Reflex  . ANA,IFA RA Diag Pnl w/rflx Tit/Patn  . ANCA screen with reflex titer  . Anti-DNA antibody, double-stranded  . Anti-scleroderma antibody  . Anti-Smith antibody  . Centromere Antibodies  . CK total and CKMB (cardiac)not at [facility]  . C-reactive protein  . Cyclic citrul peptide antibody, IgG  . Hypersensitivity Pneumonitis  . Jo-1 antibody-IgG  . Myositis Panel III  . Rheumatoid factor  . RNP Antibody  . Sedimentation rate  . Sjogren's syndrome antibods(ssa + ssb)  . Pulmonary function test   Orders Placed This Encounter  Procedures  . Aldolase    Standing Status:   Future    Standing Expiration Date:   11/19/2019  . ANA Comprehensive Panel    Standing Status:   Future    Standing Expiration Date:   11/19/2019  . ANA w/Reflex    Standing Status:   Future    Standing Expiration Date:   11/19/2019  . ANA,IFA RA Diag Pnl w/rflx Tit/Patn    Standing Status:   Future    Standing Expiration Date:   11/19/2019  . ANCA screen with reflex titer    Standing Status:   Future    Standing Expiration Date:   11/19/2019  . Anti-DNA antibody, double-stranded    Standing Status:   Future    Standing Expiration Date:   11/19/2019  . Anti-scleroderma antibody    Standing Status:   Future    Standing Expiration Date:   11/19/2019  . Anti-Smith antibody    Standing Status:   Future    Standing Expiration Date:   11/19/2019  . Centromere Antibodies    Standing Status:   Future    Standing Expiration Date:   11/19/2019  . CK total and CKMB (cardiac)not at [facility]    Standing Status:   Future    Standing Expiration Date:   11/19/2019  . C-reactive protein    Standing Status:   Future    Standing Expiration Date:   11/19/2019  . Cyclic citrul peptide antibody, IgG    Standing Status:   Future    Standing Expiration Date:   11/19/2019   . Hypersensitivity Pneumonitis    Standing Status:   Future    Standing Expiration Date:   11/19/2019  . Jo-1 antibody-IgG    Standing Status:   Future    Standing Expiration Date:   11/19/2019  . Myositis Panel III    Standing Status:   Future    Standing Expiration Date:   11/19/2019  . Rheumatoid factor    Standing Status:   Future    Standing Expiration Date:   11/19/2019  . RNP Antibody    Standing Status:   Future    Standing Expiration Date:   11/19/2019  . Sedimentation rate    Standing Status:   Future    Standing Expiration Date:   11/19/2019  . Sjogren's syndrome antibods(ssa + ssb)    Standing Status:   Future    Standing Expiration Date:   11/19/2019  . Pulmonary function test    Standing Status:   Future    Standing Expiration Date:   11/19/2019    Scheduling Instructions:     Within the next month    Order Specific Question:   Where should this test be performed?  Answer:   [facility]    Order Specific Question:   Full PFT: includes the following: basic spirometry, spirometry pre & post bronchodilator, diffusion capacity (DLCO), lung volumes    Answer:   FULL PFT Without spirometry post bronchodilator    Order Specific Question:   6 minute walk    Answer:   Yes    Order Specific Question:   Diffusion capacity (DLCO)    Answer:   Yes    Order Specific Question:   Lung volumes    Answer:   Yes    No orders of the defined types were placed in this encounter.   Follow up in about 1 month after PFTs.   Please do your part to reduce the spread of COVID-19.

## 2018-11-19 NOTE — Progress Notes (Signed)
Synopsis: Referred in August 2020 for shortness of breath by Rory Percy, MD  Subjective:   PATIENT ID: Nichole Reyes GENDER: female DOB: [birth year], MRN: [id number]  Chief Complaint  Patient presents with  . Consult    referred by Dr Rory Percy for evaluation of pulmonary fibrosis and dyspnea x6 months    Nichole Reyes is a 68 year old man referred for evaluation of dyspnea for the past several months.  In the same time interval she is also noticed fatigue.  She has previously undergone a negative cardiac evaluation and an outside CT scan of her chest.  Per report, the CT demonstrates fibrotic changes. She is a never- smoker.  She has previously been evaluated by rheumatology, Dr. Estanislado Pandy, for fibromyalgia and osteoarthritis.  She began to notice dyspnea on exertion when exercising in February 2020.  She had previously been able to walk 35 to 40 minutes, but decreased to about 20 minutes at a time due to dyspnea.  She was hospitalized in [place] in June 2020 for dyspnea. At that time she was COVID negative.  She was discharged on albuterol, which she has been using up to 4 times a day, which improves her symptoms.  There is no family history of fibrotic lung disease; multiple family members had COPD.  Her maternal grandmother had rheumatoid arthritis, but otherwise there is no family history of rheumatologic disease.  In addition to dyspnea on exertion with occasional dyspnea at rest she endorses cough and sputum production.  She denies wheezing, postnasal drip, coughing or choking when eating, Raynaud's, muscle weakness, rashes, dry eyes or mouth, joint swelling, prolonged morning stiffness.  She has occasional hand stiffness throughout the day, not associated with rest.  She has a hiatal hernia with occasional GERD with symptoms less than once a week, associated with certain foods.  She is a never smoker or vaper.  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  She has never worked in [employer] or shipyards.  She does not have any dusty hobbies.  She has a Programmer, systems, but has never had pet birds.  She does not regularly use hot tubs.  She has never had exposure to medical radiation, methotrexate, Macrobid, amiodarone, bleomycin.  Her only new medication was Zetia last month for hypertriglyceridemia.  Otherwise she has not had medication changes in greater than a year.  Last month she has had 6 episodes of epistaxis, which has improved with use of intranasal steroids prescribed by her PCP.      Past Medical History:  Diagnosis Date  . Diabetes mellitus without complication ([place])   . Fibromyalgia   . High cholesterol   . Hypertension   . Osteoporosis      Family History  Problem Relation Age of Onset  . Hypertension Mother   . Diabetes Mother   . Osteoporosis Mother   . Hypertension Father   . Heart disease Father   . COPD Father   . Colon cancer Sister   . Cancer Brother   . Heart disease Brother   . Emphysema Paternal Grandfather      Past Surgical History:  Procedure Laterality Date  . ABDOMINAL HYSTERECTOMY  2007  . CATARACT EXTRACTION Bilateral     Social History   Socioeconomic History  . Marital status: Married    Spouse name: Not on file  . Number of children: Not on file  .  Years of education: Not on file  . Highest education level: Not on file  Occupational History  . Not on file  Social Needs  . Financial resource strain: Not on file  . Food insecurity    Worry: Not on file    Inability: Not on file  . Transportation needs    Medical: Not on file    Non-medical: Not on file  Tobacco Use  . Smoking status: Never Smoker  . Smokeless tobacco: Never Used  Substance and Sexual Activity  . Alcohol use: Yes    Comment: ocassional  . Drug use: No  . Sexual activity: Not on file  Lifestyle  . Physical activity    Days per week: Not on file     Minutes per session: Not on file  . Stress: Not on file  Relationships  . Social Herbalist on phone: Not on file    Gets together: Not on file    Attends religious service: Not on file    Active member of club or organization: Not on file    Attends meetings of clubs or organizations: Not on file    Relationship status: Not on file  . Intimate partner violence    Fear of current or ex partner: Not on file    Emotionally abused: Not on file    Physically abused: Not on file    Forced sexual activity: Not on file  Other Topics Concern  . Not on file  Social History Narrative  . Not on file     Allergies  Allergen Reactions  . Latex   . Pseudoephedrine   . Sulfonamide Derivatives      Immunization History  Administered Date(s) Administered  . Influenza Split 12/18/2017  . Influenza-Unspecified 11/18/2013    Outpatient Medications Prior to Visit  Medication Sig Dispense Refill  . ACCU-CHEK AVIVA PLUS test strip     . Accu-Chek Softclix Lancets lancets     . aspirin EC 81 MG tablet Take 81 mg by mouth daily.    . Blood Glucose Monitoring Suppl (ACCU-CHEK AVIVA PLUS) w/Device KIT     . Calcium Carbonate (CALTRATE 600 PO) Take 1 tablet by mouth 2 (two) times a day.    . clonazePAM (KLONOPIN) 0.5 MG tablet Take 0.5 mg by mouth daily.     Marland Kitchen ezetimibe (ZETIA) 10 MG tablet Take 1 tablet (10 mg total) by mouth daily. 30 tablet 6  . fenofibrate (TRICOR) 48 MG tablet Take 48 mg by mouth daily.     Marland Kitchen lisinopril (ZESTRIL) 20 MG tablet Take 1 tablet by mouth daily.    . metFORMIN (GLUCOPHAGE-XR) 500 MG 24 hr tablet Take 500 mg by mouth daily with breakfast.     . metoprolol succinate (TOPROL-XL) 50 MG 24 hr tablet Take 50 mg by mouth daily.     Marland Kitchen SAVELLA 50 MG TABS tablet Take 50 mg by mouth 2 (two) times daily.     Marland Kitchen tiZANidine (ZANAFLEX) 4 MG tablet TAKE 1 TABLET BY MOUTH AT BEDTIME. 30 tablet 0  . vitamin E 400 UNIT capsule Take 400 Units by mouth daily.     No  facility-administered medications prior to visit.     Review of Systems  Constitutional: Negative for chills, fever and weight loss.  HENT: Positive for congestion and nosebleeds.   Eyes: Negative for blurred vision, pain and redness.  Respiratory: Positive for cough, sputum production and shortness of breath. Negative for wheezing.  Cardiovascular: Negative for chest pain, palpitations and leg swelling.  Gastrointestinal: Positive for heartburn. Negative for blood in stool, constipation and diarrhea.  Genitourinary: Negative for dysuria, hematuria and urgency.  Musculoskeletal: Positive for joint pain and myalgias.  Skin: Negative for rash.  Neurological: Positive for headaches. Negative for weakness.  Psychiatric/Behavioral: Positive for depression. The patient is nervous/anxious.      Objective:   Vitals:   11/19/18 1337  BP: 130/74  Pulse: 89  Temp: (!) 97.2 F (36.2 C)  TempSrc: Oral  SpO2: 98%  Weight: 178 lb (80.7 kg)  Height: _0  (1.676 m)   98% on RA BMI Readings from Last 3 Encounters:  11/19/18 28.73 kg/m  10/17/18 29.09 kg/m  08/22/18 28.92 kg/m   Wt Readings from Last 3 Encounters:  11/19/18 178 lb (80.7 kg)  10/17/18 180 lb 3.2 oz (81.7 kg)  08/22/18 179 lb 3.2 oz (81.3 kg)    Physical Exam Vitals signs reviewed.  Constitutional:      General: She is not in acute distress.    Appearance: Normal appearance. She is not ill-appearing.  HENT:     Head: Normocephalic and atraumatic.     Nose:     Comments: Deferred due to masking requirement.     Mouth/Throat:     Comments: Deferred due to masking requirement. Eyes:     General: No scleral icterus. Neck:     Musculoskeletal: Neck supple.  Cardiovascular:     Rate and Rhythm: Normal rate and regular rhythm.     Heart sounds: No murmur.  Pulmonary:     Comments: Breathing comfortably on room air.  Occasionally taking deep sighs.  No witnessed coughing.  Clear to auscultation bilaterally.  Abdominal:     General: There is no distension.     Palpations: Abdomen is soft.     Tenderness: There is no abdominal tenderness.  Musculoskeletal:        General: No swelling or deformity.     Comments: No clubbing  Lymphadenopathy:     Cervical: No cervical adenopathy.  Skin:    General: Skin is warm and dry.     Coloration: Skin is not pale.     Findings: No bruising, erythema or rash.  Neurological:     General: No focal deficit present.     Mental Status: She is alert.     Motor: No weakness.     Coordination: Coordination normal.  Psychiatric:        Behavior: Behavior normal.     Comments: Flattened affect      CBC    Component Value Date/Time   WBC 10.5 03/15/2017 1457   RBC 4.70 03/15/2017 1457   HGB 14.6 03/15/2017 1457   HCT 43.1 03/15/2017 1457   PLT 439 (H) 03/15/2017 1457   MCV 91.7 03/15/2017 1457   MCH 31.1 03/15/2017 1457   MCHC 33.9 03/15/2017 1457   RDW 11.8 03/15/2017 1457   LYMPHSABS 2,678 03/15/2017 1457   MONOABS 970 (H) 06/28/2016 1514   EOSABS 200 03/15/2017 1457   BASOSABS 147 03/15/2017 1457   03/15/2017: ANA negative Anti-CCP negative RF negative ESR 19  Chest Imaging- films reviewed: CT chest 09/24/2018- per report- probable UIP pattern with subpleural fibrosis and gradient from apical to basal lung zones.  Bronchiectasis.  A millimeter nodule in the right middle lobe.  Posterior right upper lobe 1.4 x 0.8 cm nodule abutting the minor fissure.  All new changes compared to previous imaging in 2008. Per  my review, increased interstitial subpleural markings.  No significant difference in inspiratory and expiratory views.  No honeycombing, no significant GGO. Prominent bronchi, but not necessarily larger than the adjoining BV.    Pulmonary Functions Testing Results: No flowsheet data found.  Echocardiogram (no reports directly available for review): per report, has LVH  Wrist ultrasound 07/04/2017- no synovitis of second, third, fifth  MCP joints or wrist joints.  No tenosynovitis.  Assessment & Plan:     ICD-10-CM   1. ILD (interstitial lung disease) (HCC)  J84.9 Aldolase    ANA Comprehensive Panel    ANA w/Reflex    ANA,IFA RA Diag Pnl w/rflx Tit/Patn    ANCA screen with reflex titer    Anti-DNA antibody, double-stranded    Anti-scleroderma antibody    Anti-Smith antibody    Centromere Antibodies    CK total and CKMB (cardiac)not at [facility]    C-reactive protein    Cyclic citrul peptide antibody, IgG    Hypersensitivity Pneumonitis    Jo-1 antibody-IgG    Myositis Panel III    Rheumatoid factor    RNP Antibody    Sedimentation rate    Sjogren's syndrome antibods(ssa + ssb)    Pulmonary function test  2. 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 No obvious causes for ILD. -ILD questionnaire packet- bring to next visit -ILD labs -PFT w/ 6MWT -Pneumonia shot- needs to get the second on (not sure which yet she has had) -Flu shot- plans to get in October at PCP's office -Mask & social distancing precautions due to concern for COVID. -Okay to continue using albuterol as needed -Assuming negative rheumatologic work-up, will plan to start an anti-fibrotic at her next visit.  Muscle soreness, likely due to fibromyalgia. -Will order myositis panel and inflammatory markers -Continue current management per rheumatology  Return to clinic in about 1 months after PFTs, labs, and ILD questionnaire have been completed.   Current Outpatient Medications:  .  ACCU-CHEK AVIVA PLUS test strip, , Disp: , Rfl:  .  Accu-Chek Softclix Lancets lancets, , Disp: , Rfl:  .  aspirin EC 81 MG tablet, Take 81 mg by mouth daily., Disp: , Rfl:  .  Blood Glucose Monitoring Suppl (ACCU-CHEK AVIVA PLUS) w/Device KIT, , Disp: , Rfl:  .  Calcium Carbonate (CALTRATE 600 PO), Take 1 tablet by mouth 2 (two) times a day., Disp: , Rfl:  .  clonazePAM (KLONOPIN)  0.5 MG tablet, Take 0.5 mg by mouth daily. , Disp: , Rfl:  .  ezetimibe (ZETIA) 10 MG tablet, Take 1 tablet (10 mg total) by mouth daily., Disp: 30 tablet, Rfl: 6 .  fenofibrate (TRICOR) 48 MG tablet, Take 48 mg by mouth daily. , Disp: , Rfl:  .  lisinopril (ZESTRIL) 20 MG tablet, Take 1 tablet by mouth daily., Disp: , Rfl:  .  metFORMIN (GLUCOPHAGE-XR) 500 MG 24 hr tablet, Take 500 mg by mouth daily with breakfast. , Disp: , Rfl:  .  metoprolol succinate (TOPROL-XL) 50 MG 24 hr tablet, Take 50 mg by mouth daily. , Disp: , Rfl:  .  SAVELLA 50 MG TABS tablet, Take 50 mg by mouth 2 (two) times daily. , Disp: , Rfl:  .  tiZANidine (ZANAFLEX) 4 MG tablet, TAKE 1 TABLET BY MOUTH AT BEDTIME., Disp: 30 tablet, Rfl: 0 .  vitamin E 400 UNIT capsule, Take 400 Units by mouth daily., Disp: , Rfl:  Julian Hy, DO [facility] 11/19/2018 2:36 PM

## 2018-11-19 NOTE — Progress Notes (Signed)
   Subjective:    Patient ID: Steva Colder, female    DOB: June 29, 1950, 68 y.o.   MRN: [id number]  HPI    Review of Systems  Constitutional: Negative for fever and unexpected weight change.  HENT: Negative for congestion, dental problem, ear pain, nosebleeds, postnasal drip, rhinorrhea, sinus pressure, sneezing, sore throat and trouble swallowing.   Eyes: Negative for redness and itching.  Respiratory: Negative for cough, chest tightness, shortness of breath and wheezing.   Cardiovascular: Negative for palpitations and leg swelling.  Gastrointestinal: Negative for nausea and vomiting.  Genitourinary: Negative for dysuria.  Musculoskeletal: Negative for joint swelling.  Skin: Negative for rash.  Allergic/Immunologic: Negative.  Negative for environmental allergies, food allergies and immunocompromised state.  Neurological: Negative for headaches.  Hematological: Does not bruise/bleed easily.  Psychiatric/Behavioral: Negative for dysphoric mood. The patient is not nervous/anxious.        Objective:   Physical Exam        Assessment & Plan:

## 2018-11-21 LAB — RNP ANTIBODY: Ribonucleic Protein(ENA) Antibody, IgG: <1 AI

## 2018-11-21 LAB — ANA,IFA RA DIAG PNL W/RFLX TIT/PATN
Anti Nuclear Antibody (ANA): NEGATIVE
Cyclic Citrullin Peptide Ab: <16 UNITS
Rheumatoid fact SerPl-aCnc: <14 IU/mL (ref <14)

## 2018-11-21 LAB — ANTI-SCLERODERMA ANTIBODY: Scleroderma (Scl-70) (ENA) Antibody, IgG: <1 AI

## 2018-11-21 LAB — JO-1 ANTIBODY-IGG: Jo-1 Autoabs: <1 AI

## 2018-11-21 LAB — CK TOTAL AND CKMB (NOT AT ARMC)
CK, MB: <0.7 ng/mL (ref 0–5.0)
Total CK: 49 U/L (ref 29–143)

## 2018-11-21 LAB — ANTI-DNA ANTIBODY, DOUBLE-STRANDED: ds DNA Ab: <1 IU/mL

## 2018-11-21 LAB — ANCA SCREEN W REFLEX TITER: ANCA Screen: NEGATIVE

## 2018-11-21 LAB — ALDOLASE: Aldolase: 4.7 U/L (ref <=8.1)

## 2018-11-21 LAB — SJOGREN'S SYNDROME ANTIBODS(SSA + SSB)
SSA (Ro) (ENA) Antibody, IgG: <1 AI
SSB (La) (ENA) Antibody, IgG: <1 AI

## 2018-11-21 LAB — CENTROMERE ANTIBODIES: Centromere Ab Screen: <1 AI

## 2018-11-22 NOTE — Progress Notes (Signed)
So far labs are negative. We will discuss all of the results at the next visit.

## 2018-11-26 ENCOUNTER — Other Ambulatory Visit: Payer: Self-pay | Admitting: Rheumatology

## 2018-11-26 NOTE — Telephone Encounter (Signed)
Last Visit: 08/22/18 Next Visit: 02/20/19  Okay to refill per Dr. Deveshwar  

## 2018-12-11 ENCOUNTER — Telehealth: Payer: Self-pay | Admitting: Cardiovascular Disease

## 2018-12-11 NOTE — Telephone Encounter (Signed)

## 2018-12-12 ENCOUNTER — Ambulatory Visit: Payer: Medicare Other | Admitting: Cardiovascular Disease

## 2018-12-12 ENCOUNTER — Other Ambulatory Visit: Payer: Self-pay

## 2018-12-12 ENCOUNTER — Encounter: Payer: Self-pay | Admitting: *Deleted

## 2018-12-12 ENCOUNTER — Encounter: Payer: Self-pay | Admitting: Cardiovascular Disease

## 2018-12-12 VITALS — BP 138/72 | HR 78 | Ht 65.0 in | Wt 182.0 lb

## 2018-12-12 DIAGNOSIS — R202 Paresthesia of skin: Secondary | ICD-10-CM | POA: Diagnosis not present

## 2018-12-12 DIAGNOSIS — J8410 Pulmonary fibrosis, unspecified: Secondary | ICD-10-CM | POA: Diagnosis not present

## 2018-12-12 DIAGNOSIS — I10 Essential (primary) hypertension: Secondary | ICD-10-CM

## 2018-12-12 DIAGNOSIS — R0789 Other chest pain: Secondary | ICD-10-CM | POA: Diagnosis not present

## 2018-12-12 DIAGNOSIS — R200 Anesthesia of skin: Secondary | ICD-10-CM | POA: Diagnosis not present

## 2018-12-12 NOTE — Patient Instructions (Signed)
Medication Instructions:  Continue all current medications.  Labwork: none  Testing/Procedures: none  Follow-Up: 3 months   Any Other Special Instructions Will Be Listed Below (If Applicable).  If you need a refill on your cardiac medications before your next appointment, please call your pharmacy.  

## 2018-12-12 NOTE — Progress Notes (Signed)
SUBJECTIVE: The patient presents for routine follow-up.  Chest CT from [facility] demonstrated mild to moderate pulmonary fibrosis with bronchiectasis.  I have yet to receive stress test results.  Echocardiogram on 09/13/2018 demonstrated normal left ventricular systolic function, EF 55 to 60%, mild to moderate LVH, and aortic sclerosis.  She denies chest pain, tightness, or heaviness.  She has shortness of breath at night which leads to a cough and she ends up coughing up a lot of sputum which is white and sometimes with a yellowish tinge.  She denies green-colored sputum.  She denies fevers.  She denies leg swelling.  The soles of her feet have continued to turn red sporadically.  This is been going on for the past 3 years.  Her toes will also swell.  She denies claudication pain per se.  She denies a family history of DVT.  She was evaluated by pulmonology for interstitial lung disease.  PFTs are planned in the near future.  She told me that she occasionally feels her heart skipping.      Review of Systems: As per "subjective", otherwise negative.  Allergies  Allergen Reactions  . Latex   . Pseudoephedrine   . Sulfonamide Derivatives     Current Outpatient Medications  Medication Sig Dispense Refill  . ACCU-CHEK AVIVA PLUS test strip     . Accu-Chek Softclix Lancets lancets     . aspirin EC 81 MG tablet Take 81 mg by mouth daily.    . Blood Glucose Monitoring Suppl (ACCU-CHEK AVIVA PLUS) w/Device KIT     . Calcium Carbonate (CALTRATE 600 PO) Take 1 tablet by mouth 2 (two) times a day.    . clonazePAM (KLONOPIN) 0.5 MG tablet Take 0.5 mg by mouth daily.     Marland Kitchen ezetimibe (ZETIA) 10 MG tablet Take 1 tablet (10 mg total) by mouth daily. 30 tablet 6  . fenofibrate (TRICOR) 48 MG tablet Take 48 mg by mouth daily.     Marland Kitchen lisinopril (ZESTRIL) 20 MG tablet Take 1 tablet by mouth daily.    . metFORMIN (GLUCOPHAGE-XR) 500 MG 24 hr tablet Take 500 mg by mouth daily with  breakfast.     . metoprolol succinate (TOPROL-XL) 50 MG 24 hr tablet Take 50 mg by mouth daily.     Marland Kitchen SAVELLA 50 MG TABS tablet Take 50 mg by mouth 2 (two) times daily.     Marland Kitchen tiZANidine (ZANAFLEX) 4 MG tablet TAKE 1 TABLET BY MOUTH AT BEDTIME. 30 tablet 0  . vitamin E 400 UNIT capsule Take 400 Units by mouth daily.     No current facility-administered medications for this visit.     Past Medical History:  Diagnosis Date  . Diabetes mellitus without complication ([place])   . Fibromyalgia   . High cholesterol   . Hypertension   . Osteoporosis     Past Surgical History:  Procedure Laterality Date  . ABDOMINAL HYSTERECTOMY  2007  . CATARACT EXTRACTION Bilateral     Social History   Socioeconomic History  . Marital status: Married    Spouse name: Not on file  . Number of children: Not on file  . Years of education: Not on file  . Highest education level: Not on file  Occupational History  . Not on file  Social Needs  . Financial resource strain: Not on file  . Food insecurity    Worry: Not on file    Inability: Not on file  . Transportation  needs    Medical: Not on file    Non-medical: Not on file  Tobacco Use  . Smoking status: Never Smoker  . Smokeless tobacco: Never Used  Substance and Sexual Activity  . Alcohol use: Yes    Comment: ocassional  . Drug use: No  . Sexual activity: Not on file  Lifestyle  . Physical activity    Days per week: Not on file    Minutes per session: Not on file  . Stress: Not on file  Relationships  . Social Herbalist on phone: Not on file    Gets together: Not on file    Attends religious service: Not on file    Active member of club or organization: Not on file    Attends meetings of clubs or organizations: Not on file    Relationship status: Not on file  . Intimate partner violence    Fear of current or ex partner: Not on file    Emotionally abused: Not on file    Physically abused: Not on file    Forced sexual  activity: Not on file  Other Topics Concern  . Not on file  Social History Narrative  . Not on file     Vitals:   12/12/18 0917  BP: 138/72  Pulse: 78  SpO2: 96%  Weight: 182 lb (82.6 kg)  Height: '5\' 5"'  (1.651 m)    Wt Readings from Last 3 Encounters:  12/12/18 182 lb (82.6 kg)  11/19/18 178 lb (80.7 kg)  10/17/18 180 lb 3.2 oz (81.7 kg)     PHYSICAL EXAM General: NAD HEENT: Normal. Neck: No JVD, no thyromegaly. Lungs: Diffusely diminished breath sounds. CV: Regular rate and rhythm, normal S1/S2, no S3/S4, no murmur. No pretibial or periankle edema.    Abdomen: Soft, nontender, no distention.  Neurologic: Alert and oriented.  Psych: Normal affect. Skin: Normal. Musculoskeletal: No gross deformities.      Labs: Lab Results  Component Value Date/Time   K 4.8 03/15/2017 02:57 PM   BUN 12 03/15/2017 02:57 PM   CREATININE 0.73 03/15/2017 02:57 PM   ALT 42 (H) 03/15/2017 02:57 PM   TSH 0.55 09/17/2008 03:28 PM   HGB 14.6 03/15/2017 02:57 PM     Lipids: No results found for: LDLCALC, LDLDIRECT, CHOL, TRIG, HDL     ASSESSMENT AND PLAN: 1.  Chest heaviness and shortness of breath: I am still awaiting stress test results.  She does have pulmonary fibrosis with bronchiectasis/interstitial lung disease and is now followed by pulmonary.  Left ventricular systolic function is normal.  She is on aspirin, metoprolol succinate, and Zetia.  2.  Hypertension: Blood pressure is reasonably controlled.  She has mild to moderate LVH.  No changes to therapy.  3.  Bilateral feet redness/tingling and numbness: No significant PAD noted on physical exam.  No features of Raynaud's phenomenon per se.  It does not encompass the entire sole of the foot to suggest a peripheral neuropathy.  4.  Pulmonary fibrosis: She is undergoing a thorough work-up for interstitial lung disease by pulmonary.  PFTs planned in the near future.   Disposition: Follow up 3 months   Kate Sable,  M.D., F.A.C.C.

## 2018-12-16 LAB — STATUS REPORT

## 2018-12-17 DIAGNOSIS — M797 Fibromyalgia: Secondary | ICD-10-CM | POA: Diagnosis not present

## 2018-12-17 DIAGNOSIS — W0110XA Fall on same level from slipping, tripping and stumbling with subsequent striking against unspecified object, initial encounter: Secondary | ICD-10-CM | POA: Diagnosis not present

## 2018-12-17 DIAGNOSIS — Z888 Allergy status to other drugs, medicaments and biological substances status: Secondary | ICD-10-CM | POA: Diagnosis not present

## 2018-12-17 DIAGNOSIS — Z882 Allergy status to sulfonamides status: Secondary | ICD-10-CM | POA: Diagnosis not present

## 2018-12-17 DIAGNOSIS — Z91040 Latex allergy status: Secondary | ICD-10-CM | POA: Diagnosis not present

## 2018-12-17 DIAGNOSIS — Z7982 Long term (current) use of aspirin: Secondary | ICD-10-CM | POA: Diagnosis not present

## 2018-12-17 DIAGNOSIS — Z79899 Other long term (current) drug therapy: Secondary | ICD-10-CM | POA: Diagnosis not present

## 2018-12-17 DIAGNOSIS — R0781 Pleurodynia: Secondary | ICD-10-CM | POA: Diagnosis not present

## 2018-12-17 DIAGNOSIS — S20212A Contusion of left front wall of thorax, initial encounter: Secondary | ICD-10-CM | POA: Diagnosis not present

## 2018-12-17 LAB — MYOMARKER 3 PLUS PROFILE (RDL)
Anti-Jo-1 Ab (RDL): <20 Units (ref <20)
Anti-PM/Scl-100 Ab (RDL): <20 Units (ref <20)
Anti-SS-A 52kD Ab, IgG (RDL): <20 Units (ref <20)

## 2018-12-17 LAB — HYPERSENSITIVITY PNEUMONITIS
A. Pullulans Abs: NEGATIVE
A.Fumigatus #1 Abs: NEGATIVE
Micropolyspora faeni, IgG: NEGATIVE
Pigeon Serum Abs: NEGATIVE
Thermoact. Saccharii: NEGATIVE
Thermoactinomyces vulgaris, IgG: NEGATIVE

## 2018-12-17 LAB — ANA W/REFLEX: Anti Nuclear Antibody (ANA): NEGATIVE

## 2018-12-17 LAB — ANA COMPREHENSIVE PANEL
Anti JO-1: <0.2 AI (ref 0.0–0.9)
Centromere Ab Screen: <0.2 AI (ref 0.0–0.9)
Chromatin Ab SerPl-aCnc: <0.2 AI (ref 0.0–0.9)
ENA RNP Ab: <0.2 AI (ref 0.0–0.9)
ENA SM Ab Ser-aCnc: <0.2 AI (ref 0.0–0.9)
ENA SSA (RO) Ab: <0.2 AI (ref 0.0–0.9)
ENA SSB (LA) Ab: <0.2 AI (ref 0.0–0.9)
Scleroderma (Scl-70) (ENA) Antibody, IgG: 0.2 AI (ref 0.0–0.9)
dsDNA Ab: <1 IU/mL (ref 0–9)

## 2018-12-18 DIAGNOSIS — E782 Mixed hyperlipidemia: Secondary | ICD-10-CM | POA: Diagnosis not present

## 2018-12-18 DIAGNOSIS — I10 Essential (primary) hypertension: Secondary | ICD-10-CM | POA: Diagnosis not present

## 2018-12-26 ENCOUNTER — Telehealth: Payer: Self-pay | Admitting: Critical Care Medicine

## 2018-12-30 ENCOUNTER — Other Ambulatory Visit: Payer: Self-pay | Admitting: Rheumatology

## 2018-12-30 NOTE — Telephone Encounter (Signed)
Printed & placed in folder for COVID test to be scheduled for PFT on 02/21/2019.

## 2018-12-30 NOTE — Telephone Encounter (Signed)
Last Visit: 08/22/18 Next Visit: 02/20/19  Okay to refill per Dr. Deveshwar  

## 2019-01-17 DIAGNOSIS — I10 Essential (primary) hypertension: Secondary | ICD-10-CM | POA: Diagnosis not present

## 2019-01-17 DIAGNOSIS — J8410 Pulmonary fibrosis, unspecified: Secondary | ICD-10-CM | POA: Diagnosis not present

## 2019-01-17 DIAGNOSIS — E559 Vitamin D deficiency, unspecified: Secondary | ICD-10-CM | POA: Diagnosis not present

## 2019-01-17 DIAGNOSIS — M797 Fibromyalgia: Secondary | ICD-10-CM | POA: Diagnosis not present

## 2019-01-17 DIAGNOSIS — R739 Hyperglycemia, unspecified: Secondary | ICD-10-CM | POA: Diagnosis not present

## 2019-01-17 DIAGNOSIS — E785 Hyperlipidemia, unspecified: Secondary | ICD-10-CM | POA: Diagnosis not present

## 2019-01-20 DIAGNOSIS — M797 Fibromyalgia: Secondary | ICD-10-CM | POA: Diagnosis not present

## 2019-01-20 DIAGNOSIS — Z23 Encounter for immunization: Secondary | ICD-10-CM | POA: Diagnosis not present

## 2019-01-20 DIAGNOSIS — I10 Essential (primary) hypertension: Secondary | ICD-10-CM | POA: Diagnosis not present

## 2019-01-20 DIAGNOSIS — R739 Hyperglycemia, unspecified: Secondary | ICD-10-CM | POA: Diagnosis not present

## 2019-01-20 DIAGNOSIS — Z0001 Encounter for general adult medical examination with abnormal findings: Secondary | ICD-10-CM | POA: Diagnosis not present

## 2019-01-20 DIAGNOSIS — E7800 Pure hypercholesterolemia, unspecified: Secondary | ICD-10-CM | POA: Diagnosis not present

## 2019-01-20 DIAGNOSIS — Z0000 Encounter for general adult medical examination without abnormal findings: Secondary | ICD-10-CM | POA: Diagnosis not present

## 2019-01-30 ENCOUNTER — Other Ambulatory Visit: Payer: Self-pay | Admitting: Rheumatology

## 2019-01-30 NOTE — Telephone Encounter (Signed)
Last Visit: 08/22/18 Next Visit: 02/20/19  Okay to refill per Dr. Deveshwar  

## 2019-02-06 NOTE — Progress Notes (Signed)
Virtual Visit via Telephone Note  I connected with Nichole Reyes on 02/20/19 at 11:15 AM EST by telephone and verified that I am speaking with the correct person using two identifiers.  Location: Patient: Home Provider: Clinic  This service was conducted via virtual visit.  The patient was located at home. I was located in my office.  Consent was obtained prior to the virtual visit and is aware of possible charges through their insurance for this visit.  The patient is an established patient.  Dr. Estanislado Pandy, MD conducted the virtual visit and Hazel Sams, PA-C acted as scribe during the service.  Office staff helped with scheduling follow up visits after the service was conducted.     I discussed the limitations, risks, security and privacy concerns of performing an evaluation and management service by telephone and the availability of in person appointments. I also discussed with the patient that there may be a patient responsible charge related to this service. The patient expressed understanding and agreed to proceed.  CC: left shoulder joint pain History of Present Illness: Patient is a 68 year old female with a past medical history of fibromyalgia, osteoarthritis, and DDD. She continues to have pain in the left shoulder joint.  She had a cortisone injection on 08/22/18, which provided temporary relief.  She is no longer having nocturnal pain.  She is having difficulty with ROM due to the discomfort.  She is having muscle aches and muscle tenderness due to fibromyalgia.  Her fatigue has resolved and she is sleeping better at night.  She continues to take zanaflex 4 mg po at bedtime.   Review of Systems  Constitutional: Negative for fever and malaise/fatigue.  Eyes: Negative for photophobia, pain, discharge and redness.  Respiratory: Negative for cough, shortness of breath and wheezing.   Cardiovascular: Negative for chest pain and palpitations.  Gastrointestinal: Negative for blood in  stool, constipation and diarrhea.  Genitourinary: Negative for dysuria.  Musculoskeletal: Positive for joint pain. Negative for back pain, myalgias and neck pain.  Skin: Negative for rash.  Neurological: Negative for dizziness and headaches.  Psychiatric/Behavioral: Negative for depression. The patient is not nervous/anxious and does not have insomnia.       Observations/Objective: Physical Exam  Constitutional: She is oriented to person, place, and time.  Neurological: She is alert and oriented to person, place, and time.  Psychiatric: Mood, memory, affect and judgment normal.   Patient reports morning stiffness for 0   minutes.   Patient denies nocturnal pain.  Difficulty dressing/grooming: Denies Difficulty climbing stairs: Denies Difficulty getting out of chair: Reports Difficulty using hands for taps, buttons, cutlery, and/or writing: Denies   Assessment and Plan: Visit Diagnoses: Fibromyalgia -She has generalized muscle aches and muscle tenderness due to fibromyalgia.  She has persistent left shoulder joint pain and trapezius muscle tension.  She had a left shoulder joint cortisone injection on 08/22/18 which provided temporary relief.  We will refer her to physical therapy.  She continues zanaflex 4 mg po at bedtime for muscle spasms.  She was encouraged to exercise on a regular basis.  Her fatigue has resolved and she is sleeping better at night. She will follow up in 6 months.   Other fatigue-Resolved   Other insomnia-She has been sleeping well at night.   Chronic left shoulder pain-XRs unremarkable on 08/22/18.  She had a left glenohumeral joint cortisone injection on 08/22/18, which provided temporary relief.  She is having difficulty with internal and external rotation due  to the discomfort. She has not been performing stretching exercises.  She is not having any nocturnal pain at this time. We will place a referral to physical therapy.  If her symptoms persist or worsen we  will schedule a MRI of the left shoulder joint.   Primary osteoarthritis of both knees-She has occasional discomfort in both knee joints. No joint swelling.   Primary osteoarthritis of both feet-She was recently diagnosed with neuropathy, which she feels is the origin of her feet pain.  She has no joint swelling.  Discussed wearing proper fitting shoes.   DDD (degenerative disc disease), cervical-She has no discomfort at this time.  No symptoms of radiculopathy.   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  DDD (degenerative disc disease), lumbar-Chronic pain.  She is not having increased discomfort.  No symptoms of radiculopathy.   Other medical conditions are listed as follows:   History of hypertension  History of hypercholesterolemia  History of sleep apnea  History of migraine  History of depression  History of hyperlipidemia   Follow Up Instructions: She will follow up in 6 months.    I discussed the assessment and treatment plan with the patient. The patient was provided an opportunity to ask questions and all were answered. The patient agreed with the plan and demonstrated an understanding of the instructions.   The patient was advised to call back or seek an in-person evaluation if the symptoms worsen or if the condition fails to improve as anticipated.  I provided 15 minutes of non-face-to-face time during this encounter.   Bo Merino, MD   Scribed by-  Hazel Sams, PA-C

## 2019-02-17 DIAGNOSIS — E7800 Pure hypercholesterolemia, unspecified: Secondary | ICD-10-CM | POA: Diagnosis not present

## 2019-02-17 DIAGNOSIS — I10 Essential (primary) hypertension: Secondary | ICD-10-CM | POA: Diagnosis not present

## 2019-02-20 ENCOUNTER — Other Ambulatory Visit: Payer: Self-pay

## 2019-02-20 ENCOUNTER — Encounter: Payer: Self-pay | Admitting: Rheumatology

## 2019-02-20 ENCOUNTER — Telehealth (INDEPENDENT_AMBULATORY_CARE_PROVIDER_SITE_OTHER): Payer: Medicare Other | Admitting: Rheumatology

## 2019-02-20 DIAGNOSIS — R5383 Other fatigue: Secondary | ICD-10-CM | POA: Diagnosis not present

## 2019-02-20 DIAGNOSIS — M5030 Other cervical disc degeneration, unspecified cervical region: Secondary | ICD-10-CM

## 2019-02-20 DIAGNOSIS — G8929 Other chronic pain: Secondary | ICD-10-CM

## 2019-02-20 DIAGNOSIS — G4709 Other insomnia: Secondary | ICD-10-CM | POA: Diagnosis not present

## 2019-02-20 DIAGNOSIS — Z8639 Personal history of other endocrine, nutritional and metabolic disease: Secondary | ICD-10-CM

## 2019-02-20 DIAGNOSIS — M5136 Other intervertebral disc degeneration, lumbar region: Secondary | ICD-10-CM

## 2019-02-20 DIAGNOSIS — M19072 Primary osteoarthritis, left ankle and foot: Secondary | ICD-10-CM

## 2019-02-20 DIAGNOSIS — M25512 Pain in left shoulder: Secondary | ICD-10-CM

## 2019-02-20 DIAGNOSIS — Z8669 Personal history of other diseases of the nervous system and sense organs: Secondary | ICD-10-CM

## 2019-02-20 DIAGNOSIS — M797 Fibromyalgia: Secondary | ICD-10-CM | POA: Diagnosis not present

## 2019-02-20 DIAGNOSIS — M170 Bilateral primary osteoarthritis of knee: Secondary | ICD-10-CM

## 2019-02-20 DIAGNOSIS — M19071 Primary osteoarthritis, right ankle and foot: Secondary | ICD-10-CM

## 2019-02-20 DIAGNOSIS — M5134 Other intervertebral disc degeneration, thoracic region: Secondary | ICD-10-CM

## 2019-02-20 DIAGNOSIS — Z8659 Personal history of other mental and behavioral disorders: Secondary | ICD-10-CM

## 2019-02-20 DIAGNOSIS — Z8679 Personal history of other diseases of the circulatory system: Secondary | ICD-10-CM

## 2019-02-20 NOTE — Addendum Note (Signed)
Addended by: Francis Gaines C on: 02/20/2019 01:00 PM   Modules accepted: Orders

## 2019-02-21 ENCOUNTER — Ambulatory Visit: Payer: Medicare Other | Admitting: Critical Care Medicine

## 2019-02-24 ENCOUNTER — Other Ambulatory Visit: Payer: Self-pay

## 2019-02-24 ENCOUNTER — Other Ambulatory Visit (HOSPITAL_COMMUNITY)
Admission: RE | Admit: 2019-02-24 | Discharge: 2019-02-24 | Disposition: A | Discharge status: Home / Self Care | Payer: Medicare Other | Source: Ambulatory Visit | Attending: Critical Care Medicine | Admitting: Critical Care Medicine

## 2019-02-24 DIAGNOSIS — Z01812 Encounter for preprocedural laboratory examination: Secondary | ICD-10-CM | POA: Insufficient documentation

## 2019-02-24 DIAGNOSIS — Z20828 Contact with and (suspected) exposure to other viral communicable diseases: Secondary | ICD-10-CM | POA: Insufficient documentation

## 2019-02-24 LAB — SARS CORONAVIRUS 2 (TAT 6-24 HRS): SARS Coronavirus 2: NEGATIVE

## 2019-02-27 ENCOUNTER — Other Ambulatory Visit: Payer: Self-pay | Admitting: Rheumatology

## 2019-02-27 NOTE — Telephone Encounter (Signed)
Last Visit: 02/20/2019 telemedicine  Next Visit: message sent to the front desk to schedule.   Okay to refill per Dr. Estanislado Pandy.

## 2019-02-28 ENCOUNTER — Ambulatory Visit: Payer: Medicare Other | Admitting: Critical Care Medicine

## 2019-02-28 ENCOUNTER — Encounter: Payer: Self-pay | Admitting: Critical Care Medicine

## 2019-02-28 ENCOUNTER — Ambulatory Visit (INDEPENDENT_AMBULATORY_CARE_PROVIDER_SITE_OTHER): Payer: Medicare Other | Admitting: Critical Care Medicine

## 2019-02-28 ENCOUNTER — Other Ambulatory Visit: Payer: Self-pay

## 2019-02-28 VITALS — BP 138/76 | HR 94

## 2019-02-28 DIAGNOSIS — K219 Gastro-esophageal reflux disease without esophagitis: Secondary | ICD-10-CM

## 2019-02-28 DIAGNOSIS — R0609 Other forms of dyspnea: Secondary | ICD-10-CM

## 2019-02-28 DIAGNOSIS — R0600 Dyspnea, unspecified: Secondary | ICD-10-CM

## 2019-02-28 DIAGNOSIS — J849 Interstitial pulmonary disease, unspecified: Secondary | ICD-10-CM

## 2019-02-28 LAB — PULMONARY FUNCTION TEST
DL/VA % pred: 118 %
DL/VA: 4.88 ml/min/mmHg/L
DLCO unc % pred: 77 %
DLCO unc: 15.75 ml/min/mmHg
FEF 25-75 Post: 3.34 L/sec
FEF 25-75 Pre: 3.38 L/sec
FEF2575-%Change-Post: -1 %
FEF2575-%Pred-Post: 155 %
FEF2575-%Pred-Pre: 157 %
FEV1-%Change-Post: 1 %
FEV1-%Pred-Post: 83 %
FEV1-%Pred-Pre: 82 %
FEV1-Post: 2.07 L
FEV1-Pre: 2.04 L
FEV1FVC-%Change-Post: 0 %
FEV1FVC-%Pred-Pre: 116 %
FEV6-%Change-Post: 0 %
FEV6-%Pred-Post: 73 %
FEV6-%Pred-Pre: 73 %
FEV6-Post: 2.3 L
FEV6-Pre: 2.29 L
FEV6FVC-%Pred-Post: 104 %
FEV6FVC-%Pred-Pre: 104 %
FVC-%Change-Post: 0 %
FVC-%Pred-Post: 70 %
FVC-%Pred-Pre: 70 %
FVC-Post: 2.3 L
FVC-Pre: 2.29 L
Post FEV1/FVC ratio: 90 %
Post FEV6/FVC ratio: 100 %
Pre FEV1/FVC ratio: 89 %
Pre FEV6/FVC Ratio: 100 %
RV % pred: 50 %
RV: 1.09 L
TLC % pred: 66 %
TLC: 3.44 L

## 2019-02-28 MED ORDER — FLOVENT HFA 110 MCG/ACT IN AERO: 1.0000 | INHALATION_SPRAY | Freq: Two times a day (BID) | RESPIRATORY_TRACT | 12 refills | Status: DC

## 2019-02-28 MED ORDER — OMEPRAZOLE 20 MG PO CPDR: 20.0000 mg | DELAYED_RELEASE_CAPSULE | Freq: Every day | ORAL | 11 refills | Status: DC

## 2019-02-28 NOTE — Patient Instructions (Addendum)
Thank you for visiting Dr. Carlis Abbott at [facility]. We recommend the following:  [email or web address]  Meds ordered this encounter  Medications  . omeprazole (PRILOSEC) 20 MG capsule    Sig: Take 1 capsule (20 mg total) by mouth daily.    Dispense:  30 capsule    Refill:  11  . fluticasone (FLOVENT HFA) 110 MCG/ACT inhaler    Sig: Inhale 1 puff into the lungs 2 (two) times daily.    Dispense:  1 Inhaler    Refill:  12    Return in about 2 months (around 05/01/2019).  with Dr. Chase Caller.    Please do your part to reduce the spread of COVID-19.

## 2019-02-28 NOTE — Progress Notes (Signed)
Synopsis: Referred in August 2020 for shortness of breath by Rory Percy, MD.  Subjective:   PATIENT ID: Nichole Reyes GENDER: female DOB: August 22, 1950, MRN: [id number]  Chief Complaint  Patient presents with  . Follow-up    Patient is here for PFT and ROV. Patient states that she is tired and has a cough. Patient states that sometimes it is productive with clear/ yellow sputum and that at night she wakes up because she feels like she is choking     Nichole Reyes is a 68 year old woman who presents for follow-up for ILD.  This was incidentally discovered on a CT scan performed by her PCP.  She has had shortness of breath that has been stable since her last visit.  Her cough has worsened over time.  It is mostly dry, but she occasionally has clear yellow sputum.  She sometimes has nocturnal episodes of waking up feeling like she is choking due to sensation in her throat, but does not attribute this to her sleep apnea or shortness of breath.  She has a history of chronic GERD and hiatal hernia with symptoms 1 to 2 days/week, mostly when eating spicy or greasy foods.  She has not been on antacids or suppressive therapy.  She has been using her inhaler 3-4 times a day for shortness of breath, which improves her symptoms.  She has never been on long-acting inhalers.  Review of her ILD packet demonstrates previous use of prednisone, minimal exposure to mold in her bathroom, but not otherwise in her house.  She has fibromyalgia causing joint symptoms.  She has a hiatal hernia and GERD.  She is not previously taking amiodarone, chemotherapy medications, radiation, or nitrofurantoin that she is aware of.  No pet birds.  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      OV 11/19/2018: Nichole Reyes is a 68 year old woman referred for evaluation of dyspnea for the past several months.  In the same time interval she is also noticed fatigue.  She has previously undergone a negative  cardiac evaluation and an outside CT scan of her chest.  Per report, the CT demonstrates fibrotic changes. She is a never- smoker.  She has previously been evaluated by rheumatology, Dr. Estanislado Pandy, for fibromyalgia and osteoarthritis.  She began to notice dyspnea on exertion when exercising in February 2020.  She had previously been able to walk 35 to 40 minutes, but decreased to about 20 minutes at a time due to dyspnea.  She was hospitalized in [place] in June 2020 for dyspnea. At that time she was COVID negative.  She was discharged on albuterol, which she has been using up to 4 times a day, which improves her symptoms.  There is no family history of fibrotic lung disease; multiple family members had COPD.  Her maternal grandmother had rheumatoid arthritis, but otherwise there is no family history of rheumatologic disease.  In addition to dyspnea on exertion with occasional dyspnea at rest she endorses cough and sputum production.  She denies wheezing, postnasal drip, coughing or choking when eating, Raynaud's, muscle weakness, rashes, dry eyes or mouth, joint swelling, prolonged morning stiffness.  She has occasional hand stiffness throughout the day, not associated with rest.  She has a hiatal hernia with occasional GERD with symptoms less than once a week, associated with certain foods.  She is a never smoker or vaper.  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  She has never worked in [employer] or shipyards.  She does not have any dusty hobbies.  She has a Programmer, systems, but has never had pet birds.  She does not regularly use hot tubs.  She has never had exposure to medical radiation, methotrexate, Macrobid, amiodarone, bleomycin.  Her only new medication was Zetia last month for hypertriglyceridemia.  Otherwise she has not had medication changes in greater than a year.  Last month she has had 6 episodes of  epistaxis, which has improved with use of intranasal steroids prescribed by her PCP.      Past Medical History:  Diagnosis Date  . Diabetes mellitus without complication ([place])   . Fibromyalgia   . High cholesterol   . Hypertension   . Osteoporosis      Family History  Problem Relation Age of Onset  . Hypertension Mother   . Diabetes Mother   . Osteoporosis Mother   . Hypertension Father   . Heart disease Father   . COPD Father   . Colon cancer Sister   . Cancer Brother   . Heart disease Brother   . Emphysema Paternal Grandfather      Past Surgical History:  Procedure Laterality Date  . ABDOMINAL HYSTERECTOMY  2007  . CATARACT EXTRACTION Bilateral     Social History   Socioeconomic History  . Marital status: Married    Spouse name: Not on file  . Number of children: Not on file  . Years of education: Not on file  . Highest education level: Not on file  Occupational History  . Not on file  Tobacco Use  . Smoking status: Never Smoker  . Smokeless tobacco: Never Used  Substance and Sexual Activity  . Alcohol use: Yes    Comment: ocassional  . Drug use: No  . Sexual activity: Not on file  Other Topics Concern  . Not on file  Social History Narrative  . Not on file   Social Determinants of Health   Financial Resource Strain:   . Difficulty of Paying Living Expenses: Not on file  Food Insecurity:   . Worried About Charity fundraiser in the Last Year: Not on file  . Ran Out of Food in the Last Year: Not on file  Transportation Needs:   . Lack of Transportation (Medical): Not on file  . Lack of Transportation (Non-Medical): Not on file  Physical Activity:   . Days of Exercise per Week: Not on file  . Minutes of Exercise per Session: Not on file  Stress:   . Feeling of Stress : Not on file  Social Connections:   . Frequency of Communication with Friends and Family: Not on file  . Frequency of Social Gatherings with Friends and Family: Not on file  .  Attends Religious Services: Not on file  . Active Member of Clubs or Organizations: Not on file  . Attends Archivist Meetings: Not on file  . Marital Status: Not on file  Intimate Partner Violence:   . Fear of Current or Ex-Partner: Not on file  . Emotionally Abused: Not on file  . Physically Abused: Not on file  . Sexually Abused: Not on file     Allergies  Allergen Reactions  . Latex   . Pseudoephedrine   . Sulfonamide Derivatives      Immunization History  Administered Date(s) Administered  . Fluad Quad(high Dose 65+) 01/29/2019  . Influenza Split 12/18/2017  .  Influenza-Unspecified 11/18/2013    Outpatient Medications Prior to Visit  Medication Sig Dispense Refill  . ACCU-CHEK AVIVA PLUS test strip     . Accu-Chek Softclix Lancets lancets     . aspirin EC 81 MG tablet Take 81 mg by mouth daily.    . Blood Glucose Monitoring Suppl (ACCU-CHEK AVIVA PLUS) w/Device KIT     . Calcium Carbonate (CALTRATE 600 PO) Take 1 tablet by mouth 2 (two) times a day.    . clonazePAM (KLONOPIN) 0.5 MG tablet Take 0.5 mg by mouth daily.     Marland Kitchen ezetimibe (ZETIA) 10 MG tablet Take 1 tablet (10 mg total) by mouth daily. 30 tablet 6  . fenofibrate (TRICOR) 48 MG tablet Take 48 mg by mouth daily.     Marland Kitchen lisinopril (ZESTRIL) 20 MG tablet Take 1 tablet by mouth daily.    . metFORMIN (GLUCOPHAGE-XR) 500 MG 24 hr tablet Take 500 mg by mouth daily with breakfast.     . metoprolol succinate (TOPROL-XL) 50 MG 24 hr tablet Take 50 mg by mouth daily.     Marland Kitchen SAVELLA 50 MG TABS tablet Take 50 mg by mouth 2 (two) times daily.     Marland Kitchen tiZANidine (ZANAFLEX) 4 MG tablet TAKE 1 TABLET BY MOUTH AT BEDTIME. 30 tablet 0  . vitamin E 400 UNIT capsule Take 400 Units by mouth daily.     No facility-administered medications prior to visit.    Review of Systems  Constitutional: Negative for chills, fever and weight loss.  HENT: Positive for congestion and nosebleeds.   Eyes: Negative for blurred vision,  pain and redness.  Respiratory: Positive for cough, sputum production and shortness of breath. Negative for wheezing.   Cardiovascular: Negative for chest pain, palpitations and leg swelling.  Gastrointestinal: Positive for heartburn. Negative for blood in stool, constipation and diarrhea.  Genitourinary: Negative for dysuria, hematuria and urgency.  Musculoskeletal: Positive for joint pain and myalgias.  Skin: Negative for rash.  Neurological: Positive for headaches. Negative for weakness.  Psychiatric/Behavioral: Positive for depression. The patient is nervous/anxious.      Objective:   Vitals:   02/28/19 1607  BP: 138/76  Pulse: 94  SpO2: 95%   95% on RA BMI Readings from Last 3 Encounters:  12/12/18 30.29 kg/m  11/19/18 28.73 kg/m  10/17/18 29.09 kg/m   Wt Readings from Last 3 Encounters:  12/12/18 182 lb (82.6 kg)  11/19/18 178 lb (80.7 kg)  10/17/18 180 lb 3.2 oz (81.7 kg)    Physical Exam Vitals reviewed.  Constitutional:      General: She is not in acute distress.    Appearance: She is not ill-appearing.  HENT:     Head: Normocephalic and atraumatic.     Nose:     Comments: Deferred due to masking requirement.    Mouth/Throat:     Comments: Deferred due to masking requirement. Eyes:     General: No scleral icterus. Cardiovascular:     Rate and Rhythm: Normal rate and regular rhythm.     Heart sounds: No murmur.  Pulmonary:     Comments: Breathing comfortably on room air, no conversational dyspnea.  No witnessed coughing.  CTAB. Abdominal:     General: There is no distension.     Palpations: Abdomen is soft.     Tenderness: There is no abdominal tenderness.  Musculoskeletal:        General: No swelling or deformity.     Cervical back: Neck supple.  Lymphadenopathy:  Cervical: No cervical adenopathy.  Skin:    General: Skin is warm and dry.     Findings: No rash.  Neurological:     General: No focal deficit present.     Mental Status: She is  alert.     Motor: No weakness.     Coordination: Coordination normal.  Psychiatric:        Mood and Affect: Mood normal.        Behavior: Behavior normal.      CBC    Component Value Date/Time   WBC 10.5 03/15/2017 1457   RBC 4.70 03/15/2017 1457   HGB 14.6 03/15/2017 1457   HCT 43.1 03/15/2017 1457   PLT 439 (H) 03/15/2017 1457   MCV 91.7 03/15/2017 1457   MCH 31.1 03/15/2017 1457   MCHC 33.9 03/15/2017 1457   RDW 11.8 03/15/2017 1457   LYMPHSABS 2,678 03/15/2017 1457   MONOABS 970 (H) 06/28/2016 1514   EOSABS 200 03/15/2017 1457   BASOSABS 147 03/15/2017 1457   03/15/2017: ANA negative Anti-CCP negative RF negative ESR 19  11/19/2018: CRP<1 ESR 17 CK and aldolase within normal limits All other ILD antibodies negative HP panel negative  Chest Imaging- films reviewed: CT chest 09/24/2018- subpleural predominant fibrosis, no honeycombing. Most prominent in mid-lung zones. 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  Bronchiectasis.  A millimeter nodule in the right middle lobe.  Posterior right upper lobe 1.4 x 0.8 cm nodule abutting the minor fissure.  All new changes compared to previous imaging in 2008. Per my review, increased interstitial subpleural markings.  No significant difference in inspiratory and expiratory views.  No honeycombing, no significant GGO. Prominent bronchi, but not necessarily larger than the adjoining BV.    Pulmonary Functions Testing Results: PFT Results Latest Ref Rng & Units 11/19/2018  FVC-Pre L 2.29  FVC-Predicted Pre % 70  FVC-Post L 2.30  FVC-Predicted Post % 70  Pre FEV1/FVC % % 89  Post FEV1/FCV % % 90  FEV1-Pre L 2.04  FEV1-Predicted Pre % 82  FEV1-Post L 2.07  DLCO UNC% % 77  DLCO COR %Predicted % 118  TLC L 3.44  TLC % Predicted % 66  RV % Predicted % 50    02/28/2019- No significant obstruction.  Mild restriction, mild diffusion impairment.  Essentially unchanged  since 11/2018.  Echocardiogram (no reports directly available for review): per report, has LVH  Wrist ultrasound 07/04/2017- no synovitis of second, third, fifth MCP joints or wrist joints.  No tenosynovitis.  Assessment & Plan:     ICD-10-CM   1. ILD (interstitial lung disease) (HCC)  J84.9 omeprazole (PRILOSEC) 20 MG capsule    fluticasone (FLOVENT HFA) 110 MCG/ACT inhaler  2. DOE (dyspnea on exertion)  R06.00 omeprazole (PRILOSEC) 20 MG capsule    fluticasone (FLOVENT HFA) 110 MCG/ACT inhaler  3. Gastroesophageal reflux disease, unspecified whether esophagitis present  K21.9 omeprazole (PRILOSEC) 20 MG capsule     ILD causing dyspnea on exertion and cough- CT with probable UIP pattern of subpleural reticulation with minimal groundglass. No obvious causes for ILD.  Restriction and mild diffusion impairment confirmed on PFTs. 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  Do not think she would benefit from a lung biopsy with the pattern on her CT scan. -Recommend starting nintedanib 150 mg twice daily.  She recently had yearly labs performed at her PCP, and she will mail  these results to [place] next week. -Up-to-date on seasonal flu vaccine -Okay to continue albuterol as needed.  Given frequency of her use and perceived benefit, will start Flovent twice daily.  Instructed to rinse her mouth after every use -Paperwork for nintedanib completed in the office today. -We discussed the likely side effects of diarrhea and nausea.  Imodium over-the-counter is recommended to deal diarrhea.  If she has nausea and vomiting we can prescribe an antiemetic.  GERD -Start omeprazole once daily -IPF patients tend to have slower progression of fibrosis with appropriate treatment of GERD.  Return to clinic in about 2 months- patient will establish with Dr. Chase Caller.     Current Outpatient Medications:  .  ACCU-CHEK AVIVA PLUS test strip, , Disp: , Rfl:  .   Accu-Chek Softclix Lancets lancets, , Disp: , Rfl:  .  aspirin EC 81 MG tablet, Take 81 mg by mouth daily., Disp: , Rfl:  .  Blood Glucose Monitoring Suppl (ACCU-CHEK AVIVA PLUS) w/Device KIT, , Disp: , Rfl:  .  Calcium Carbonate (CALTRATE 600 PO), Take 1 tablet by mouth 2 (two) times a day., Disp: , Rfl:  .  clonazePAM (KLONOPIN) 0.5 MG tablet, Take 0.5 mg by mouth daily. , Disp: , Rfl:  .  ezetimibe (ZETIA) 10 MG tablet, Take 1 tablet (10 mg total) by mouth daily., Disp: 30 tablet, Rfl: 6 .  fenofibrate (TRICOR) 48 MG tablet, Take 48 mg by mouth daily. , Disp: , Rfl:  .  lisinopril (ZESTRIL) 20 MG tablet, Take 1 tablet by mouth daily., Disp: , Rfl:  .  metFORMIN (GLUCOPHAGE-XR) 500 MG 24 hr tablet, Take 500 mg by mouth daily with breakfast. , Disp: , Rfl:  .  metoprolol succinate (TOPROL-XL) 50 MG 24 hr tablet, Take 50 mg by mouth daily. , Disp: , Rfl:  .  SAVELLA 50 MG TABS tablet, Take 50 mg by mouth 2 (two) times daily. , Disp: , Rfl:  .  tiZANidine (ZANAFLEX) 4 MG tablet, TAKE 1 TABLET BY MOUTH AT BEDTIME., Disp: 30 tablet, Rfl: 0 .  vitamin E 400 UNIT capsule, Take 400 Units by mouth daily., Disp: , Rfl:  .  fluticasone (FLOVENT HFA) 110 MCG/ACT inhaler, Inhale 1 puff into the lungs 2 (two) times daily., Disp: 1 Inhaler, Rfl: 12 .  omeprazole (PRILOSEC) 20 MG capsule, Take 1 capsule (20 mg total) by mouth daily., Disp: 30 capsule, Rfl: 11   Julian Hy, DO [facility] 02/28/2019 5:58 PM

## 2019-02-28 NOTE — Progress Notes (Signed)
PFT done today. 

## 2019-03-06 ENCOUNTER — Encounter: Payer: Self-pay | Admitting: Critical Care Medicine

## 2019-03-06 NOTE — Progress Notes (Signed)
Nichole Reyes sent labs in for review from recent PCP visit on 01/17/2019\  BUN 12 Creatinine 0.83 Bilirubin 0.3 Alk phos 70 AST 19 ALT 27  H&H 14/40.9  A1c 6.6  Report to be scanned into media section.  Based on these labs she is okay to proceed with starting nintedanib.  She will need serial labs shortly after starting, eventually getting them every 3 months indefinitely while she remains on nintedanib.  Nichole Hy, DO 03/06/19 2:31 PM [facility]

## 2019-03-12 ENCOUNTER — Telehealth: Payer: Self-pay | Admitting: Cardiovascular Disease

## 2019-03-12 ENCOUNTER — Encounter: Payer: Self-pay | Admitting: Cardiovascular Disease

## 2019-03-12 ENCOUNTER — Telehealth (INDEPENDENT_AMBULATORY_CARE_PROVIDER_SITE_OTHER): Payer: Medicare Other | Admitting: Cardiovascular Disease

## 2019-03-12 VITALS — BP 139/66 | HR 90 | Ht 65.0 in | Wt 175.0 lb

## 2019-03-12 DIAGNOSIS — R200 Anesthesia of skin: Secondary | ICD-10-CM

## 2019-03-12 DIAGNOSIS — R0602 Shortness of breath: Secondary | ICD-10-CM

## 2019-03-12 DIAGNOSIS — R202 Paresthesia of skin: Secondary | ICD-10-CM

## 2019-03-12 DIAGNOSIS — I10 Essential (primary) hypertension: Secondary | ICD-10-CM

## 2019-03-12 DIAGNOSIS — R0789 Other chest pain: Secondary | ICD-10-CM | POA: Diagnosis not present

## 2019-03-12 DIAGNOSIS — J8410 Pulmonary fibrosis, unspecified: Secondary | ICD-10-CM

## 2019-03-12 NOTE — Progress Notes (Signed)
Virtual Visit via Telephone Note   This visit type was conducted due to national recommendations for restrictions regarding the COVID-19 Pandemic (e.g. social distancing) in an effort to limit this patient's exposure and mitigate transmission in our community.  Due to her co-morbid illnesses, this patient is at least at moderate risk for complications without adequate follow up.  This format is felt to be most appropriate for this patient at this time.  The patient did not have access to video technology/had technical difficulties with video requiring transitioning to audio format only (telephone).  All issues noted in this document were discussed and addressed.  No physical exam could be performed with this format.  Please refer to the patient's chart for her  consent to telehealth for [facility].   Date:  03/12/2019   ID:  Nichole Reyes, DOB 07-Jan-1951, MRN [id number]  Patient Location: Home Provider Location: Office  PCP:  Rory Percy, MD  Cardiologist:  Kate Sable, MD  Electrophysiologist:  None   Evaluation Performed:  Follow-Up Visit  Chief Complaint:  Chest heaviness and shortness of breath  History of Present Illness:    Nichole Reyes is a 68 y.o. female with pulmonary fibrosis and bronchiectasis as well as fibromyalgia.  She underwent a low risk nuclear stress test on 09/13/2018 with no myocardial ischemia or scar, EF 59%.  Echocardiogram on 09/13/2018 demonstrated normal left ventricular systolic function, EF 55 to 60%, mild to moderate LVH, and aortic sclerosis.  She denies chest pain. Her breathing is stable. She does not use oxygen.  Her rheumatologist has referred her for physical therapy.  She was recently diagnosed with b/l feet arthritis and neuropathy. The soles of her feet have continued to turn red sporadically. She denies claudication pain per se. She denies leg swelling.  She likes to bake.    Past Medical History:  Diagnosis Date  .  Diabetes mellitus without complication ([place])   . Fibromyalgia   . High cholesterol   . Hypertension   . Osteoporosis    Past Surgical History:  Procedure Laterality Date  . ABDOMINAL HYSTERECTOMY  2007  . CATARACT EXTRACTION Bilateral      Current Meds  Medication Sig  . ACCU-CHEK AVIVA PLUS test strip   . Accu-Chek Softclix Lancets lancets   . aspirin EC 81 MG tablet Take 81 mg by mouth daily.  . Blood Glucose Monitoring Suppl (ACCU-CHEK AVIVA PLUS) w/Device KIT   . Calcium Carbonate (CALTRATE 600 PO) Take 1 tablet by mouth 2 (two) times a day.  . clonazePAM (KLONOPIN) 0.5 MG tablet Take 0.5 mg by mouth daily.   Marland Kitchen ezetimibe (ZETIA) 10 MG tablet Take 1 tablet (10 mg total) by mouth daily.  . fenofibrate (TRICOR) 48 MG tablet Take 48 mg by mouth daily.   . fluticasone (FLOVENT HFA) 110 MCG/ACT inhaler Inhale 1 puff into the lungs 2 (two) times daily.  Marland Kitchen lisinopril (ZESTRIL) 20 MG tablet Take 1 tablet by mouth daily.  . metFORMIN (GLUCOPHAGE-XR) 500 MG 24 hr tablet Take 500 mg by mouth daily with breakfast.   . metoprolol succinate (TOPROL-XL) 50 MG 24 hr tablet Take 50 mg by mouth daily.   Marland Kitchen omeprazole (PRILOSEC) 20 MG capsule Take 1 capsule (20 mg total) by mouth daily.  Marland Kitchen SAVELLA 50 MG TABS tablet Take 50 mg by mouth 2 (two) times daily.   Marland Kitchen tiZANidine (ZANAFLEX) 4 MG tablet TAKE 1 TABLET BY MOUTH AT BEDTIME.  . vitamin E 400 UNIT capsule  Take 400 Units by mouth daily.     Allergies:   Latex, Pseudoephedrine, and Sulfonamide derivatives   Social History   Tobacco Use  . Smoking status: Never Smoker  . Smokeless tobacco: Never Used  Substance Use Topics  . Alcohol use: Yes    Comment: ocassional  . Drug use: No     Family Hx: The patient's family history includes COPD in her father; Cancer in her brother; Colon cancer in her sister; Diabetes in her mother; Emphysema in her paternal grandfather; Heart disease in her brother and father; Hypertension in her father and  mother; Osteoporosis in her mother.  ROS:   Please see the history of present illness.     All other systems reviewed and are negative.   Prior CV studies:   The following studies were reviewed today:  Reviewed above  Labs/Other Tests and Data Reviewed:    EKG:  No ECG reviewed.  Recent Labs: No results found for requested labs within last 8760 hours.   Recent Lipid Panel No results found for: CHOL, TRIG, HDL, CHOLHDL, LDLCALC, LDLDIRECT  Wt Readings from Last 3 Encounters:  03/12/19 175 lb (79.4 kg)  12/12/18 182 lb (82.6 kg)  11/19/18 178 lb (80.7 kg)     Objective:    Vital Signs:  BP 139/66   Pulse 90   Ht '5\' 5"'  (1.651 m)   Wt 175 lb (79.4 kg)   BMI 29.12 kg/m    VITAL SIGNS:  reviewed  ASSESSMENT & PLAN:    1. Chest heaviness and shortness of breath:  She underwent a low risk nuclear stress test with no evidence of ischemia or scar in June 2020 as reviewed above.  She does have pulmonary fibrosis with bronchiectasis/interstitial lung disease and is followed by pulmonary.  Left ventricular systolic function is normal.  She is on aspirin, metoprolol succinate, and Zetia. She denies chest pain and has very mild chest heaviness.  I suspect symptoms of shortness of breath are due to interstitial lung disease.  No further cardiac testing is indicated at this time.  2. Hypertension: Blood pressure is reasonably controlled.  She has mild to moderate LVH.  No changes to therapy.  3. Bilateral feet redness/tingling and numbness: No significant PAD. No features of Raynaud's phenomenon per se.  4. Pulmonary fibrosis: Followed by pulmonary.     COVID-19 Education: The signs and symptoms of COVID-19 were discussed with the patient and how to seek care for testing (follow up with PCP or arrange E-visit).  The importance of social distancing was discussed today.  Time:   Today, I have spent 15 minutes with the patient with telehealth technology discussing the  above problems.     Medication Adjustments/Labs and Tests Ordered: Current medicines are reviewed at length with the patient today.  Concerns regarding medicines are outlined above.   Tests Ordered: No orders of the defined types were placed in this encounter.   Medication Changes: No orders of the defined types were placed in this encounter.   Follow Up:  Virtual Visit  prn  Signed, Kate Sable, MD  03/12/2019 1:42 PM    [facility]

## 2019-03-12 NOTE — Telephone Encounter (Signed)
Virtual Visit Pre-Appointment Phone Call  "(Name), I am calling you today to discuss your upcoming appointment. We are currently trying to limit exposure to the virus that causes COVID-19 by seeing patients at home rather than in the office."  "What is the BEST phone number to call the day of the visit?" -  [phone] 1. "Do you have or have access to (through a family member/friend) a smartphone with video capability that we can use for your visit?" a. If yes - list this number in appt notes as "cell" (if different from BEST phone #) and list the appointment type as a VIDEO visit in appointment notes b. If no - list the appointment type as a PHONE visit in appointment notes  2. Confirm consent - "In the setting of the current Covid19 crisis, you are scheduled for a (phone or video) visit with your provider on (date) at (time).  Just as we do with many in-office visits, in order for you to participate in this visit, we must obtain consent.  If you'd like, I can send this to your mychart (if signed up) or email for you to review.  Otherwise, I can obtain your verbal consent now.  All virtual visits are billed to your insurance company just like a normal visit would be.  By agreeing to a virtual visit, we'd like you to understand that the technology does not allow for your provider to perform an examination, and thus may limit your provider's ability to fully assess your condition. If your provider identifies any concerns that need to be evaluated in person, we will make arrangements to do so.  Finally, though the technology is pretty good, we cannot assure that it will always work on either your or our end, and in the setting of a video visit, we may have to convert it to a phone-only visit.  In either situation, we cannot ensure that we have a secure connection.  Are you willing to proceed?" STAFF: Did the patient verbally acknowledge consent to telehealth visit? Document YES/NO here:   YES    3. Advise patient to be prepared - "Two hours prior to your appointment, go ahead and check your blood pressure, pulse, oxygen saturation, and your weight (if you have the equipment to check those) and write them all down. When your visit starts, your provider will ask you for this information. If you have an Apple Watch or Kardia device, please plan to have heart rate information ready on the day of your appointment. Please have a pen and paper handy nearby the day of the visit as well."  4. Give patient instructions for MyChart download to smartphone OR Doximity/Doxy.me as below if video visit (depending on what platform provider is using)  5. Inform patient they will receive a phone call 15 minutes prior to their appointment time (may be from unknown caller ID) so they should be prepared to answer    TELEPHONE CALL NOTE  Nichole Reyes has been deemed a candidate for a follow-up tele-health visit to limit community exposure during the Covid-19 pandemic. I spoke with the patient via phone to ensure availability of phone/video source, confirm preferred email & phone number, and discuss instructions and expectations.  I reminded Nichole Reyes to be prepared with any vital sign and/or heart rhythm information that could potentially be obtained via home monitoring, at the time of her visit. I reminded Nichole Reyes to expect a phone call prior to her visit.  Chanda Busing 03/12/2019 10:28 AM   INSTRUCTIONS FOR DOWNLOADING THE MYCHART APP TO SMARTPHONE  - The patient must first make sure to have activated MyChart and know their login information - If Apple, go to [employer] and type in MyChart in the search bar and download the app. If Android, ask patient to go to [employer] and type in [place] in the search bar and download the app. The app is free but as with any other app downloads, their phone may require them to verify saved payment information or Apple/Android  password.  - The patient will need to then log into the app with their MyChart username and password, and select [place] as their healthcare provider to link the account. When it is time for your visit, go to the MyChart app, find appointments, and click Begin Video Visit. Be sure to Select Allow for your device to access the Microphone and Camera for your visit. You will then be connected, and your provider will be with you shortly.  **If they have any issues connecting, or need assistance please contact MyChart service desk [phone])**  **If using a computer, in order to ensure the best quality for their visit they will need to use either of the following Internet Browsers: [employer], or Google Chrome**  IF USING DOXIMITY or DOXY.ME - The patient will receive a link just prior to their visit by text.     FULL LENGTH CONSENT FOR TELE-HEALTH VISIT   I hereby voluntarily request, consent and authorize [facility] and its employed or contracted physicians, physician assistants, nurse practitioners or other licensed health care professionals (the Practitioner), to provide me with telemedicine health care services (the "Services") as deemed necessary by the treating Practitioner. I acknowledge and consent to receive the Services by the Practitioner via telemedicine. I understand that the telemedicine visit will involve communicating with the Practitioner through live audiovisual communication technology and the disclosure of certain medical information by electronic transmission. I acknowledge that I have been given the opportunity to request an in-person assessment or other available alternative prior to the telemedicine visit and am voluntarily participating in the telemedicine visit.  I understand that I have the right to withhold or withdraw my consent to the use of telemedicine in the course of my care at any time, without affecting my right to future care or treatment,  and that the Practitioner or I may terminate the telemedicine visit at any time. I understand that I have the right to inspect all information obtained and/or recorded in the course of the telemedicine visit and may receive copies of available information for a reasonable fee.  I understand that some of the potential risks of receiving the Services via telemedicine include:  Marland Kitchen Delay or interruption in medical evaluation due to technological equipment failure or disruption; . Information transmitted may not be sufficient (e.g. poor resolution of images) to allow for appropriate medical decision making by the Practitioner; and/or  . In rare instances, security protocols could fail, causing a breach of personal health information.  Furthermore, I acknowledge that it is my responsibility to provide information about my medical history, conditions and care that is complete and accurate to the best of my ability. I acknowledge that Practitioner's advice, recommendations, and/or decision may be based on factors not within their control, such as incomplete or inaccurate data provided by me or distortions of diagnostic images or specimens that may result from electronic transmissions. I understand that the  practice of medicine is not an Chief Strategy Officer and that Practitioner makes no warranties or guarantees regarding treatment outcomes. I acknowledge that I will receive a copy of this consent concurrently upon execution via email to the email address I last provided but may also request a printed copy by calling the office of [facility].    I understand that my insurance will be billed for this visit.   I have read or had this consent read to me. . I understand the contents of this consent, which adequately explains the benefits and risks of the Services being provided via telemedicine.  . I have been provided ample opportunity to ask questions regarding this consent and the Services and have had my questions  answered to my satisfaction. . I give my informed consent for the services to be provided through the use of telemedicine in my medical care  By participating in this telemedicine visit I agree to the above.

## 2019-03-12 NOTE — Patient Instructions (Signed)
Medication Instructions:  Your physician recommends that you continue on your current medications as directed. Please refer to the Current Medication list given to you today.  *If you need a refill on your cardiac medications before your next appointment, please call your pharmacy*  Lab Work: None today If you have labs (blood work) drawn today and your tests are completely normal, you will receive your results only by: . MyChart Message (if you have MyChart) OR . A paper copy in the mail If you have any lab test that is abnormal or we need to change your treatment, we will call you to review the results.  Testing/Procedures: None today  Follow-Up: At [facility], you and your health needs are our priority.  As part of our continuing mission to provide you with exceptional heart care, we have created designated Provider Care Teams.  These Care Teams include your primary Cardiologist (physician) and Advanced Practice Providers (APPs -  Physician Assistants and Nurse Practitioners) who all work together to provide you with the care you need, when you need it.    Follow up as needed !     Thank you for choosing [facility] !        

## 2019-04-01 ENCOUNTER — Telehealth: Payer: Self-pay | Admitting: Critical Care Medicine

## 2019-04-01 ENCOUNTER — Ambulatory Visit: Payer: Medicare Other | Admitting: Adult Health

## 2019-04-01 NOTE — Telephone Encounter (Signed)
Spoke with the pt's aid [name] She is asking about the ofev and status of medication  I have checked with [facility] about this  Forms faxed to [employer] on 03/13/2019   Called [place] at [phone]  Spoke with [place]  She states that they received the application but it has not been processed yet  Pt notified of this and nothing further needed at this time

## 2019-04-04 ENCOUNTER — Other Ambulatory Visit: Payer: Self-pay | Admitting: Rheumatology

## 2019-04-04 NOTE — Telephone Encounter (Signed)
Last Visit: 02/20/2019 telemedicine  Next Visit: 09/05/2019  Okay to refill per Dr. Deveshwar  

## 2019-04-18 DIAGNOSIS — I10 Essential (primary) hypertension: Secondary | ICD-10-CM | POA: Diagnosis not present

## 2019-05-01 ENCOUNTER — Other Ambulatory Visit: Payer: Self-pay

## 2019-05-01 NOTE — Patient Outreach (Signed)
[facility]) Care Management  05/01/2019  ZOELY GILBREATH 09/19/1950 [id number]   Medication Adherence call to Mrs. [name]  [place] Identifiers Verify spoke with patient she is past due on Metformin Er 500 mg,patient explain she has already order thru [facility] and will pick up tomorrow,patient is taking 1 tablet daily and has enough until tomorrow. Mrs. Sellers is showing past due under [place].  [facility] Management Direct Dial [phone]  Fax [phone] [email or web address]

## 2019-05-05 ENCOUNTER — Other Ambulatory Visit: Payer: Self-pay | Admitting: Rheumatology

## 2019-05-05 NOTE — Telephone Encounter (Signed)
Last Visit: 02/20/2019 telemedicine  Next Visit: 09/05/2019  Okay to refill per Dr. Deveshwar  

## 2019-05-06 ENCOUNTER — Encounter: Payer: Self-pay | Admitting: Internal Medicine

## 2019-05-06 ENCOUNTER — Other Ambulatory Visit: Payer: Self-pay

## 2019-05-06 ENCOUNTER — Ambulatory Visit: Payer: Medicare Other | Admitting: Internal Medicine

## 2019-05-06 ENCOUNTER — Other Ambulatory Visit: Payer: Self-pay | Admitting: *Deleted

## 2019-05-06 VITALS — BP 138/90 | HR 90 | Ht 65.0 in | Wt 179.4 lb

## 2019-05-06 DIAGNOSIS — J84112 Idiopathic pulmonary fibrosis: Secondary | ICD-10-CM | POA: Diagnosis not present

## 2019-05-06 DIAGNOSIS — R05 Cough: Secondary | ICD-10-CM

## 2019-05-06 DIAGNOSIS — J849 Interstitial pulmonary disease, unspecified: Secondary | ICD-10-CM

## 2019-05-06 DIAGNOSIS — R053 Chronic cough: Secondary | ICD-10-CM

## 2019-05-06 DIAGNOSIS — K219 Gastro-esophageal reflux disease without esophagitis: Secondary | ICD-10-CM

## 2019-05-06 LAB — HEPATIC FUNCTION PANEL
ALT: 33 U/L (ref 0–35)
AST: 31 U/L (ref 0–37)
Albumin: 4.2 g/dL (ref 3.5–5.2)
Alkaline Phosphatase: 64 U/L (ref 39–117)
Bilirubin, Direct: 0.1 mg/dL (ref 0.0–0.3)
Total Bilirubin: 0.3 mg/dL (ref 0.2–1.2)
Total Protein: 7.3 g/dL (ref 6.0–8.3)

## 2019-05-06 NOTE — Telephone Encounter (Signed)
After further discussion with pt, pt is now going to be switching from OFEV to [place]. Will give paperwork to pharmacy team once it has been filled out and signed.

## 2019-05-06 NOTE — Telephone Encounter (Signed)
Public relations account executive, is there any way you can look into this for [place] please. Pt had visit with MR today 2/16 and he was asking about status of pt beginning on OFEV.

## 2019-05-06 NOTE — Patient Instructions (Addendum)
ILD (interstitial lung disease) ([place]) IPF (idiopathic pulmonary fibrosis) ([place])  -You have pulmonary fibrosis otherwise call interstitial lung disease [ILD] -I think the specific variety of ILD that you have is called idiopathic pulmonary fibrosis [IPF] which is a progressive disease  - Currently mild disease - FVC/DLCO avge 75% approx  -I can give you this diagnosis with very high confidence but not with 100% confidence [agree with Dr. Carlis Abbott  -Diagnosis based on your age greater than 65, clubbing on exam, CT scan with high probability for this condition, negative history on your questionnaire and negative blood test on your serology exams  -You might need a mini bronchoscopy biopsy as an added to to increase the probability of confidence for this diagnosis but we can discuss this later  -I plan to discuss your situation in March 2021 case conference  - yes, this is a progressive disease  Plan  -We discussed the role of antifibrotic therapy in slowing the progression of this disease -Check liver function test today  -Start pirfenidone per protocol  -Initially went with nintedanib but this has not been started yet.  We went over the 2 approved antifibrotic.  We made a decision [shared] to start pirfenidone instead -Future thinks in disease management that we will discuss include pulmonary rehabilitation, patient support group, research trials and transplantation -Definitely get Covid vaccine if you are not allergic to any injectable or vaccines  Gastroesophageal reflux disease, unspecified whether esophagitis present  -Controlling acid reflux is going to be important for control of IPF based on current evidence  Plan -Continue Prilosec -At some point we can discuss about dietary changes and GI referral  Chronic cough  -This is due to pulmonary fibrosis and also because of ACE inhibitor.  There might be other reasons for this  Plan -Please talk to primary care physician and stop  lisinopril --Further management of the cough after he stopped lisinopril  Shortness of breath  -This is due to weight and also IPF  Plan  -Pulmonary rehabilitation can help -refer to rehab at [facility] in [place], [place]  Follow-up -In 4-6 weeks do a video or telephone visit to make sure you are handling pirfenidone well and it is actually improved and you have started it

## 2019-05-06 NOTE — Progress Notes (Signed)
OV 11/19/2018: Nichole Reyes is a 69 year old woman referred for evaluation of dyspnea for the past several months.  In the same time interval she is also noticed fatigue.  She has previously undergone a negative cardiac evaluation and an outside CT scan of her chest.  Per report, the CT demonstrates fibrotic changes. She is a never- smoker.  She has previously been evaluated by rheumatology, Dr. Estanislado Pandy, for fibromyalgia and osteoarthritis.  She began to notice dyspnea on exertion when exercising in February 2020.  She had previously been able to walk 35 to 40 minutes, but decreased to about 20 minutes at a time due to dyspnea.  She was hospitalized in [place] in June 2020 for dyspnea. At that time she was COVID negative.  She was discharged on albuterol, which she has been using up to 4 times a day, which improves her symptoms.  There is no family history of fibrotic lung disease; multiple family members had COPD.  Her maternal grandmother had rheumatoid arthritis, but otherwise there is no family history of rheumatologic disease.  In addition to dyspnea on exertion with occasional dyspnea at rest she endorses cough and sputum production.  She denies wheezing, postnasal drip, coughing or choking when eating, Raynaud's, muscle weakness, rashes, dry eyes or mouth, joint swelling, prolonged morning stiffness.  She has occasional hand stiffness throughout the day, not associated with rest.  She has a hiatal hernia with occasional GERD with symptoms less than once a week, associated with certain foods.  She is a never smoker or vaper.  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  She has never worked in [employer] or shipyards.  She does not have any dusty hobbies.  She has a Programmer, systems, but has never had pet birds.  She does not regularly use hot tubs.  She has never had exposure to medical radiation, methotrexate,  Macrobid, amiodarone, bleomycin.  Her only new medication was Zetia last month for hypertriglyceridemia.  Otherwise she has not had medication changes in greater than a year.  Last month she has had 6 episodes of epistaxis, which has improved with use of intranasal steroids prescribed by her PCP.     OV 05/06/2019  Subjective:  Patient ID: Nichole Reyes, female , DOB: [birth year] , age 69 y.o. , MRN: [id number] , ADDRESS: [place] [id number]   05/06/2019 -   Chief Complaint  Patient presents with  . Follow-up    Pt states she has both good days and bad days with breathing. Pt states she will occ have to use her rescue inhaler at least 1-2 times with activities.     HPI Nichole Reyes 69 y.o. -referred by Dr. 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  She was first seen by Dr. Carlis Abbott in September 2020 noted above.  She followed up in December 2020 and had pulmonary function test.  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  Due to insurance delays she has not started the nintedanib yet.  She is wondering about this.  She has specific questions about taking Covid vaccine.  She is also worried about her significant cough.  Review shows she is on ACE inhibitor.  She has a tickle in the throat.  She has acid reflux and is on Prilosec.  She has a history  of hiatal hernia.  She is taken a decision on starting nintedanib but she is interested in hearing about the other antifibrotic pirfenidone because of insurance delay she has not started on anything as yet.   [facility] Comprehensive ILD Questionnaire  Symptoms:  -Insidious onset of shortness of breath 7 months ago.  Since it started it has been the same.  She also has episodic amount of dyspnea.  The severity classification is below.  She does not know when her cough started but she does have a cough associated with clearing of the throat.  It is  moderate in intensity.  She does cough at night.  She does bring up phlegm.  The phlegm is usually white but sometimes yellow.  She does feel a tickle in her throat but does not wake up in the middle of the night because of the cough.  There is no wheezing.  Cough is associated with ACE inhibitor intake.  SYMPTOM SCALE - ILD 05/06/2019   O2 use ra  Shortness of Breath 0 -> 5 scale with 5 being worst (score 6 If unable to do)  At rest 4  Simple tasks - showers, clothes change, eating, shaving 4  Household (dishes, doing bed, laundry) 5  Shopping 5  Walking level at own pace 5  Walking up Stairs 5  Total (30-36) Dyspnea Score 28  How bad is your cough? 2.5  How bad is your fatigue  yes  How bad is nausea x  How bad is vomiting?  x  How bad is diarrhea? x  How bad is anxiety? x  How bad is depression x       Past Medical History : Positive for fibromyalgia, hiatal hernia, sleep apnea both for the last few years.  There is also borderline diabetes..  And hyperlipidemia   she denies any asthma or COPD or heart failure rheumatoid arthritis or scleroderma or lupus.  Denies any polymyositis.  Denies Sjogren's.  Denies HIV.  Denies pulmonary hypertension.  Denies thyroid disease or stroke.  Denies seizures or mononucleosis or hepatitis or tuberculosis.  Denies kidney disease.  Denies pneumonia.  Denies history of blood clots or heart disease or pleurisy.  Autoimmune serology sept 2020  - normal Results for [name], [name] (MRN [id number]) as of 05/06/2019 12:01  Ref. Range 11/19/2018 14:58  SEE BELOW Unknown Comment  Anti Nuclear Antibody (ANA) Latest Ref Range: Negative  Negative  Anti JO-1 Latest Ref Range: 0.0 - 0.9 AI <0.2  CENTROMERE AB SCREEN Latest Ref Range: 0.0 - 0.9 AI <0.2  dsDNA Ab Latest Ref Range: 0 - 9 IU/mL <1  ENA RNP Ab Latest Ref Range: 0.0 - 0.9 AI <0.2  ENA SSA (RO) Ab Latest Ref Range: 0.0 - 0.9 AI <0.2  ENA SSB (LA) Ab Latest Ref Range: 0.0 - 0.9 AI <0.2  ENA SM  Ab Ser-aCnc Latest Ref Range: 0.0 - 0.9 AI <0.2  Chromatin Ab SerPl-aCnc Latest Ref Range: 0.0 - 0.9 AI <0.2  Anti-Jo-1 Ab (RDL) Latest Ref Range: <20 Units <20  Anti-PL-7 Ab (RDL) Unknown CANCELED  Anti-PL-12 Ab (RDL) Unknown CANCELED  Anti-EJ Ab (RDL) Unknown CANCELED  Anti-OJ Ab (RDL) Unknown CANCELED  Anti-SRP Ab (RDL) Unknown CANCELED  Anti-Mi-2 Ab (RDL) Unknown CANCELED  Anti-TIF-1gamma Ab (RDL) Latest Units: Units CANCELED  Anti-MDA-5 Ab (CADM-140)(RDL) Latest Units: Units CANCELED  Anti-NXP-2 (P140) Ab (RDL) Latest Units: Units CANCELED  Anti-SAE1 Ab, IgG (RDL) Latest Units: Units CANCELED  Anti-PM/Scl-100 Ab (RDL) Latest Ref  Range: <20 Units <20  Anti-Ku Ab (RDL) Unknown CANCELED  Anti-SS-A 52kD Ab, IgG (RDL) Latest Ref Range: <20 Units <20  Anti-U1 RNP Ab (RDL) Latest Units: Units CANCELED    ROS: She has fatigue for the last several years and also arthralgia.  She attributes this to fibromyalgia.  She also has pain in her fingers which she attributes due to fibromyalgia.  She has heartburn from hiatal hernia she says.  She also snores because of her sleep apnea and she has a rash related to eczema.  But otherwise negative.   FAMILY HISTORY of LUNG DISEASE: She thinks her dad might have had COPD and her grandfather.  However nobody with pulmonary fibrosis or other lung diseases.   EXPOSURE HISTORY: Denies ever having smoked cigarettes although she grew up in a home with her dad smoked.  Therefore positive for passive smoking as a child.  Denies cigar use.  Denies marijuana use denies cocaine use denies IV drug use.  Denies vaping.   HOME and HOBBY DETAILS : [place]  She is lived in this home for 22 years.  The home is 67 years old.  She has noticed mildew periodically in the shower curtain and in the bathroom.  She does use a CPAP mask but there is no water circuit mold or mildew in it.  She does do gardening with the flower garden she does use  a steam iron but there is no mold or mildew in it.  There is no pet birds.  No feather pillow or duvet.  No pet gerbils.  No mold in the [facility] duct.  Does not play any wind instruments.  Does not use a humidifier.  Does not have a Jacuzzi in the house.   OCCUPATIONAL HISTORY (122 questions) : Essentially negative.  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   PULMONARY TOXICITY HISTORY (27 items): Negative except prednisone use    Results for SHAKISHA, ABEND (MRN [id number]) as of 05/06/2019 12:01  Ref. Range 11/19/2018 but ? Done 02/28/2019 14:57  FVC-Pre Latest Units: L 2.29  FVC-%Pred-Pre Latest Units: % 70   Results for KIEANNA, ROLLO (MRN [id number]) as of 05/06/2019 12:01  Ref. Range 11/19/2018 but ? Done 02/28/2019 14:57  DLCO unc Latest Units: ml/min/mmHg 15.75  DLCO unc % pred Latest Units: % 77   Simple office walk 185 feet x  3 laps goal with forehead probe 05/06/2019   O2 used ra  Number laps completed 3  Comments about pace avg  Resting Pulse Ox/HR 98% and 90/min  Final Pulse Ox/HR 96% and 100/min  Desaturated </= 88% no  Desaturated <= 3% points no  Got Tachycardic >/= 90/min yes  Symptoms at end of test Mod dyspnea, knee pain  Miscellaneous comments x   She had a high-resolution CT chest September 24, 2018 at [facility].  This was read by our radiologist Dr Eddie Candle..  I do not have the image to visualized but I trust this report.  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  In addition is described a 1.4 cm right upper lobe posterior segment inferior part nodule that is subpleural and also 8 mm subpleural right middle lobe nodule.  He says these findings are unchanged compared to 2008 and  are benign.  ROS - per HPI     has a past medical history of Diabetes mellitus  without complication ([place]), Fibromyalgia, High cholesterol, Hypertension, and Osteoporosis.   reports that she has never smoked. She has never used smokeless tobacco.  Past Surgical History:  Procedure Laterality Date  . ABDOMINAL HYSTERECTOMY  2007  . CATARACT EXTRACTION Bilateral     Allergies  Allergen Reactions  . Latex   . Pseudoephedrine   . Sulfonamide Derivatives     Immunization History  Administered Date(s) Administered  . Fluad Quad(high Dose 65+) 01/29/2019  . Influenza Split 12/18/2017  . Influenza-Unspecified 11/18/2013    Family History  Problem Relation Age of Onset  . Hypertension Mother   . Diabetes Mother   . Osteoporosis Mother   . Hypertension Father   . Heart disease Father   . COPD Father   . Colon cancer Sister   . Cancer Brother   . Heart disease Brother   . Emphysema Paternal Grandfather      Current Outpatient Medications:  .  ACCU-CHEK AVIVA PLUS test strip, , Disp: , Rfl:  .  Accu-Chek Softclix Lancets lancets, , Disp: , Rfl:  .  albuterol (VENTOLIN HFA) 108 (90 Base) MCG/ACT inhaler, , Disp: , Rfl:  .  aspirin EC 81 MG tablet, Take 81 mg by mouth daily., Disp: , Rfl:  .  Blood Glucose Monitoring Suppl (ACCU-CHEK AVIVA PLUS) w/Device KIT, , Disp: , Rfl:  .  Calcium Carbonate (CALTRATE 600 PO), Take 1 tablet by mouth 2 (two) times a day., Disp: , Rfl:  .  clonazePAM (KLONOPIN) 0.5 MG tablet, Take 0.5 mg by mouth daily. , Disp: , Rfl:  .  fenofibrate (TRICOR) 48 MG tablet, Take 48 mg by mouth daily. , Disp: , Rfl:  .  fluticasone (FLOVENT HFA) 110 MCG/ACT inhaler, Inhale 1 puff into the lungs 2 (two) times daily., Disp: 1 Inhaler, Rfl: 12 .  lisinopril (ZESTRIL) 20 MG tablet, Take 1 tablet by mouth daily., Disp: , Rfl:  .  metFORMIN (GLUCOPHAGE-XR) 500 MG 24 hr tablet, Take 500 mg by mouth daily with breakfast. , Disp: , Rfl:  .  metoprolol succinate (TOPROL-XL) 50 MG 24 hr tablet, Take 50 mg by mouth daily. , Disp: , Rfl:  .  omeprazole  (PRILOSEC) 20 MG capsule, Take 1 capsule (20 mg total) by mouth daily., Disp: 30 capsule, Rfl: 11 .  SAVELLA 50 MG TABS tablet, Take 50 mg by mouth 2 (two) times daily. , Disp: , Rfl:  .  tiZANidine (ZANAFLEX) 4 MG tablet, TAKE 1 TABLET BY MOUTH AT BEDTIME., Disp: 30 tablet, Rfl: 0 .  ezetimibe (ZETIA) 10 MG tablet, Take 1 tablet (10 mg total) by mouth daily., Disp: 30 tablet, Rfl: 6      Objective:   Vitals:   05/06/19 1154  BP: 138/90  Pulse: 90  SpO2: 98%  Weight: 81.4 kg  Height: 5' 5" (1.651 m)    Estimated body mass index is 29.85 kg/m as calculated from the following:   Height as of this encounter: 5' 5" (1.651 m).   Weight as of this encounter: 81.4 kg.  _0 @  Filed Weights   05/06/19 1154  Weight: 81.4 kg     Physical Exam   Obese female with Mallampati class III.  Faint bibasilar bilateral crackles present but difficult to auscultate.  Abdomen soft.  Visceral obesity present.  She has fingernail clubbing.  No other evidence of stigmata of connective tissue  disease.  STIGMATA of CONNECTIVE TISSUE DISEASE  - Distal digital fissuring (ie, "mechanic hands") - no - Distal digital tip ulceration - no -Inflammatory arthritis or polyarticular morning joint stiffness ?60 minutes - no - Palmar telangiectasia - no - Raynaud phenomenon - no - Unexplained digital edema - no - Unexplained fixed rash on the digital extensor surfaces (Gottron's sign) - no ... - Deformities of RA - no - Scleroderma  - no - Malar Rash -  no      Assessment:       ICD-10-CM   1. ILD (interstitial lung disease) (HCC)  J84.9 Hepatic function panel    AMB referral to pulmonary rehabilitation  2. IPF (idiopathic pulmonary fibrosis) ([place])  J84.112   3. Gastroesophageal reflux disease, unspecified whether esophagitis present  K21.9   4. Chronic cough  R05        Plan:     Patient Instructions  ILD (interstitial lung disease) ([place]) IPF (idiopathic pulmonary fibrosis)  ([place])  -You have pulmonary fibrosis otherwise call interstitial lung disease [ILD] -I think the specific variety of ILD that you have is called idiopathic pulmonary fibrosis [IPF] which is a progressive disease  - Currently mild disease - FVC/DLCO avge 75% approx  -I can give you this diagnosis with very high confidence but not with 100% confidence [agree with Dr. Carlis Abbott  -Diagnosis based on your age greater than 65, clubbing on exam, CT scan with high probability for this condition, negative history on your questionnaire and negative blood test on your serology exams  -You might need a mini bronchoscopy biopsy as an added to to increase the probability of confidence for this diagnosis but we can discuss this later  -I plan to discuss your situation in March 2021 case conference  - yes, this is a progressive disease  Plan  -We discussed the role of antifibrotic therapy in slowing the progression of this disease -Check liver function test today  -Start pirfenidone per protocol  -Initially went with nintedanib but this has not been started yet.  We went over the 2 approved antifibrotic.  We made a decision [shared] to start pirfenidone instead -Future thinks in disease management that we will discuss include pulmonary rehabilitation, patient support group, research trials and transplantation -Definitely get Covid vaccine if you are not allergic to any injectable or vaccines  Gastroesophageal reflux disease, unspecified whether esophagitis present  -Controlling acid reflux is going to be important for control of IPF based on current evidence  Plan -Continue Prilosec -At some point we can discuss about dietary changes and GI referral  Chronic cough  -This is due to pulmonary fibrosis and also because of ACE inhibitor.  There might be other reasons for this  Plan -Please talk to primary care physician and stop lisinopril --Further management of the cough after he stopped  lisinopril  Shortness of breath  -This is due to weight and also IPF  Plan  -Pulmonary rehabilitation can help -refer to rehab at [facility] in [place], [place]  Follow-up -In 4-6 weeks do a video or telephone visit to make sure you are handling pirfenidone well and it is actually improved and you have started it   Noted: We discussed nintedanib versus pirfenidone.  We discussed the idea that both drugs have equal efficacy with the different side effects.  Discussed pirfenidone is mostly his nausea and vomiting with weight loss and anorexia with some incidence of diarrhea.  Versus the reverse is true with nintedanib.  Both  drugs require liver function test monitoring.  Pirfenidone requires 3 pills 3 times daily with food initially and later rolling over to 1 pill 3 times daily with food.  Pirfenidone requires spacing at least 5 or 6 hours apart.  Pirfenidone request sunscreen.  We did discussed the black box warning with nintedanib of bleeding diathesis or cardiac risk.  This would not apply to her.  Based on the overall discussion she decided to go towards pirfenidone.    ( Level 05 visit: estb 40-54 min   in  visit type: on-site physical face to visit  in total care time and counseling or/and coordination of care by this undersigned MD - Dr Brand Males. This includes one or more of the following on this same day 05/06/2019: pre-charting, chart review, note writing, documentation discussion of test results, diagnostic or treatment recommendations, prognosis, risks and benefits of management options, instructions, education, compliance or risk-factor reduction. It excludes time spent by the [facility] or office staff in the care of the patient. Actual time 54 min)   SIGNATURE    Dr. Brand Males, M.D., F.C.C.P,  Pulmonary and Critical Care Medicine Staff Physician, [place] Director - Interstitial Lung Disease  Program  Pulmonary [place] at [place], [place], [address]  Pager: [phone], If no answer or between  15:00h - 7:00h: call [phone] Telephone: [phone]  12:51 PM 05/06/2019

## 2019-05-07 ENCOUNTER — Telehealth: Payer: Self-pay | Admitting: Pharmacy Technician

## 2019-05-07 ENCOUNTER — Telehealth: Payer: Self-pay | Admitting: Internal Medicine

## 2019-05-07 DIAGNOSIS — Z5181 Encounter for therapeutic drug level monitoring: Secondary | ICD-10-CM

## 2019-05-07 DIAGNOSIS — J84112 Idiopathic pulmonary fibrosis: Secondary | ICD-10-CM

## 2019-05-07 NOTE — Telephone Encounter (Signed)
Submitted a Prior Authorization request to [facility] for [facility] via Cover My Meds. Will update once we receive a response.

## 2019-05-07 NOTE — Progress Notes (Signed)
Lft normal

## 2019-05-07 NOTE — Telephone Encounter (Signed)
Spoke with pt. She had some questions about starting Esbriet. States that she received a call from her insurance that she had been approved. Answered the pt's questions to the best of my ability. Nothing further was needed.

## 2019-05-07 NOTE — Telephone Encounter (Signed)
Esbriet New start forms have been received. Will start Esbriet PA.

## 2019-05-07 NOTE — Telephone Encounter (Signed)
Received notification from [facility] regarding a prior authorization for ESBRIET. Authorization has been APPROVED from 05/07/19 to 03/19/20.   Authorization # [id number] Phone # [phone]  Ran test claim, patient's copay for 1 month is $2,479.33.  12:18 PM Beatriz Chancellor, CPhT

## 2019-05-12 NOTE — Telephone Encounter (Signed)
Called patient to discuss Esbriet benefits investigation. We discussed her insurance copay and patient assistance. Patient provided income/ household size: #2/ $85,000. Her income is too high for the 2 available grants. Advised patient that she may qualify for Manufacturer assistance. [facility], they had received patient's application, but the dx code was missing. Completed and refaxed patient's forms. They advised the turn around would be 48-72 hours.  2:26 PM Beatriz Chancellor, CPhT

## 2019-05-13 ENCOUNTER — Telehealth: Payer: Self-pay | Admitting: Internal Medicine

## 2019-05-13 NOTE — Telephone Encounter (Signed)
Called and spoke with pt to see if she knew who had tried to call her. Pt said she had received a call and message to let her know that her lft labwork was normal. Nothing further needed.

## 2019-05-16 ENCOUNTER — Telehealth: Payer: Self-pay | Admitting: Internal Medicine

## 2019-05-16 DIAGNOSIS — I10 Essential (primary) hypertension: Secondary | ICD-10-CM | POA: Diagnosis not present

## 2019-05-16 NOTE — Telephone Encounter (Signed)
Called [facility] [employer] to check on patient's referral. Documents have been received and are currently pending. Will follow up on Monday.  Phone# [phone]

## 2019-05-16 NOTE — Telephone Encounter (Signed)
Returned call to [employer], and confirmed date on form is 05/06/2019. They will process forms.  Phone# [phone]

## 2019-05-16 NOTE — Telephone Encounter (Signed)
The information below has been forwarded to you based on the telephone note dated 05/07/19.

## 2019-05-20 ENCOUNTER — Other Ambulatory Visit: Payer: Self-pay | Admitting: Cardiovascular Disease

## 2019-05-20 MED ORDER — ESBRIET 267 MG PO TABS: ORAL_TABLET | ORAL | 0 refills | Status: DC

## 2019-05-20 NOTE — Telephone Encounter (Signed)
Called [employer] to follow up on Esbriet referral. Rep Myriam Jacobson advised patient was enrolled into a $9,000 [employer] grant through 04/17/20. Patient has been triaged to [facility].  [id number]  [id number]  [id number]  Called [facility] to check status on patient's Esbriet order. Rep Caryl Pina advised that they counseled patient and are now just awaiting prescription from provider's office.  9:25 AM Beatriz Chancellor, CPhT

## 2019-05-20 NOTE — Telephone Encounter (Signed)
Prescription sent to [facility] for [employer] dose.  She has a follow up appointment with Dr. Chase Caller on 06/03/2019. She will be due for labs in 1 month. Future orders placed.   Mariella Saa, PharmD, [place], [place] Clinical Specialty Pharmacist [phone]  05/20/2019 12:31 PM

## 2019-05-27 ENCOUNTER — Ambulatory Visit: Payer: Self-pay | Admitting: Internal Medicine

## 2019-05-27 ENCOUNTER — Ambulatory Visit (INDEPENDENT_AMBULATORY_CARE_PROVIDER_SITE_OTHER): Payer: Medicare Other | Admitting: Internal Medicine

## 2019-05-29 ENCOUNTER — Other Ambulatory Visit: Payer: Self-pay | Admitting: Rheumatology

## 2019-05-29 NOTE — Telephone Encounter (Signed)
Last Visit: 02/20/2019 telemedicine  Next Visit: 09/05/2019  Okay to refill per Dr. Deveshwar  

## 2019-06-03 ENCOUNTER — Other Ambulatory Visit: Payer: Self-pay

## 2019-06-03 ENCOUNTER — Ambulatory Visit (INDEPENDENT_AMBULATORY_CARE_PROVIDER_SITE_OTHER): Payer: Medicare Other | Admitting: Internal Medicine

## 2019-06-03 DIAGNOSIS — J84112 Idiopathic pulmonary fibrosis: Secondary | ICD-10-CM

## 2019-06-03 DIAGNOSIS — J849 Interstitial pulmonary disease, unspecified: Secondary | ICD-10-CM | POA: Diagnosis not present

## 2019-06-03 DIAGNOSIS — Z5181 Encounter for therapeutic drug level monitoring: Secondary | ICD-10-CM

## 2019-06-03 DIAGNOSIS — K219 Gastro-esophageal reflux disease without esophagitis: Secondary | ICD-10-CM

## 2019-06-03 NOTE — Progress Notes (Signed)
OV 11/19/2018: Nichole Reyes is a 69 year old woman referred for evaluation of dyspnea for the past several months.  In the same time interval she is also noticed fatigue.  She has previously undergone a negative cardiac evaluation and an outside CT scan of her chest.  Per report, the CT demonstrates fibrotic changes. She is a never- smoker.  She has previously been evaluated by rheumatology, Dr. Estanislado Pandy, for fibromyalgia and osteoarthritis.  She began to notice dyspnea on exertion when exercising in February 2020.  She had previously been able to walk 35 to 40 minutes, but decreased to about 20 minutes at a time due to dyspnea.  She was hospitalized in [place] in June 2020 for dyspnea. At that time she was COVID negative.  She was discharged on albuterol, which she has been using up to 4 times a day, which improves her symptoms.  There is no family history of fibrotic lung disease; multiple family members had COPD.  Her maternal grandmother had rheumatoid arthritis, but otherwise there is no family history of rheumatologic disease.  In addition to dyspnea on exertion with occasional dyspnea at rest she endorses cough and sputum production.  She denies wheezing, postnasal drip, coughing or choking when eating, Raynaud's, muscle weakness, rashes, dry eyes or mouth, joint swelling, prolonged morning stiffness.  She has occasional hand stiffness throughout the day, not associated with rest.  She has a hiatal hernia with occasional GERD with symptoms less than once a week, associated with certain foods.  She is a never smoker or vaper.  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  She has never worked in [employer] or shipyards.  She does not have any dusty hobbies.  She has a Programmer, systems, but has never had pet birds.  She does not regularly use hot tubs.  She has never had exposure to medical radiation,  methotrexate, Macrobid, amiodarone, bleomycin.  Her only new medication was Zetia last month for hypertriglyceridemia.  Otherwise she has not had medication changes in greater than a year.  Last month she has had 6 episodes of epistaxis, which has improved with use of intranasal steroids prescribed by her PCP.     OV 05/06/2019  Subjective:  Patient ID: Nichole Reyes, female , DOB: August 29, 1950 , age 14 y.o. , MRN: [id number] , ADDRESS: [place] [address]   05/06/2019 -   Chief Complaint  Patient presents with  . Follow-up    Pt states she has both good days and bad days with breathing. Pt states she will occ have to use her rescue inhaler at least 1-2 times with activities.     HPI Nichole Reyes 69 y.o. -referred by Dr. 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  She was first seen by Dr. Carlis Abbott in September 2020 noted above.  She followed up in December 2020 and had pulmonary function test.  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  Due to insurance delays she has not started the nintedanib yet.  She is wondering about this.  She has specific questions about taking Covid vaccine.  She is also worried about her significant cough.  Review shows she is on ACE inhibitor.  She has a tickle in the throat.  She has acid reflux and is on Prilosec.  She has a  history of hiatal hernia.  She is taken a decision on starting nintedanib but she is interested in hearing about the other antifibrotic pirfenidone because of insurance delay she has not started on anything as yet.   [place] Integrated Comprehensive ILD Questionnaire  Symptoms:  -Insidious onset of shortness of breath 7 months ago.  Since it started it has been the same.  She also has episodic amount of dyspnea.  The severity classification is below.  She does not know when her cough started but she does have a cough associated with clearing of the  throat.  It is moderate in intensity.  She does cough at night.  She does bring up phlegm.  The phlegm is usually white but sometimes yellow.  She does feel a tickle in her throat but does not wake up in the middle of the night because of the cough.  There is no wheezing.  Cough is associated with ACE inhibitor intake.  SYMPTOM SCALE - ILD 05/06/2019   O2 use ra  Shortness of Breath 0 -> 5 scale with 5 being worst (score 6 If unable to do)  At rest 4  Simple tasks - showers, clothes change, eating, shaving 4  Household (dishes, doing bed, laundry) 5  Shopping 5  Walking level at own pace 5  Walking up Stairs 5  Total (30-36) Dyspnea Score 28  How bad is your cough? 2.5  How bad is your fatigue  yes  How bad is nausea x  How bad is vomiting?  x  How bad is diarrhea? x  How bad is anxiety? x  How bad is depression x       Past Medical History : Positive for fibromyalgia, hiatal hernia, sleep apnea both for the last few years.  There is also borderline diabetes..  And hyperlipidemia   she denies any asthma or COPD or heart failure rheumatoid arthritis or scleroderma or lupus.  Denies any polymyositis.  Denies Sjogren's.  Denies HIV.  Denies pulmonary hypertension.  Denies thyroid disease or stroke.  Denies seizures or mononucleosis or hepatitis or tuberculosis.  Denies kidney disease.  Denies pneumonia.  Denies history of blood clots or heart disease or pleurisy.  Autoimmune serology sept 2020  - normal Results for STUTI, SANDIN (MRN [id number]) as of 05/06/2019 12:01  Ref. Range 11/19/2018 14:58  SEE BELOW Unknown Comment  Anti Nuclear Antibody (ANA) Latest Ref Range: Negative  Negative  Anti JO-1 Latest Ref Range: 0.0 - 0.9 AI <0.2  CENTROMERE AB SCREEN Latest Ref Range: 0.0 - 0.9 AI <0.2  dsDNA Ab Latest Ref Range: 0 - 9 IU/mL <1  ENA RNP Ab Latest Ref Range: 0.0 - 0.9 AI <0.2  ENA SSA (RO) Ab Latest Ref Range: 0.0 - 0.9 AI <0.2  ENA SSB (LA) Ab Latest Ref Range: 0.0 - 0.9 AI  <0.2  ENA SM Ab Ser-aCnc Latest Ref Range: 0.0 - 0.9 AI <0.2  Chromatin Ab SerPl-aCnc Latest Ref Range: 0.0 - 0.9 AI <0.2  Anti-Jo-1 Ab (RDL) Latest Ref Range: <20 Units <20  Anti-PL-7 Ab (RDL) Unknown CANCELED  Anti-PL-12 Ab (RDL) Unknown CANCELED  Anti-EJ Ab (RDL) Unknown CANCELED  Anti-OJ Ab (RDL) Unknown CANCELED  Anti-SRP Ab (RDL) Unknown CANCELED  Anti-Mi-2 Ab (RDL) Unknown CANCELED  Anti-TIF-1gamma Ab (RDL) Latest Units: Units CANCELED  Anti-MDA-5 Ab (CADM-140)(RDL) Latest Units: Units CANCELED  Anti-NXP-2 (P140) Ab (RDL) Latest Units: Units CANCELED  Anti-SAE1 Ab, IgG (RDL) Latest Units: Units CANCELED  Anti-PM/Scl-100 Ab (RDL) Latest  Ref Range: <20 Units <20  Anti-Ku Ab (RDL) Unknown CANCELED  Anti-SS-A 52kD Ab, IgG (RDL) Latest Ref Range: <20 Units <20  Anti-U1 RNP Ab (RDL) Latest Units: Units CANCELED    ROS: She has fatigue for the last several years and also arthralgia.  She attributes this to fibromyalgia.  She also has pain in her fingers which she attributes due to fibromyalgia.  She has heartburn from hiatal hernia she says.  She also snores because of her sleep apnea and she has a rash related to eczema.  But otherwise negative.   FAMILY HISTORY of LUNG DISEASE: She thinks her dad might have had COPD and her grandfather.  However nobody with pulmonary fibrosis or other lung diseases.   EXPOSURE HISTORY: Denies ever having smoked cigarettes although she grew up in a home with her dad smoked.  Therefore positive for passive smoking as a child.  Denies cigar use.  Denies marijuana use denies cocaine use denies IV drug use.  Denies vaping.   HOME and HOBBY DETAILS : [place]  She is lived in this home for 22 years.  The home is 67 years old.  She has noticed mildew periodically in the shower curtain and in the bathroom.  She does use a CPAP mask but there is no water circuit mold or mildew in it.  She does do gardening with the flower garden  she does use a steam iron but there is no mold or mildew in it.  There is no pet birds.  No feather pillow or duvet.  No pet gerbils.  No mold in the [facility] duct.  Does not play any wind instruments.  Does not use a humidifier.  Does not have a Jacuzzi in the house.   OCCUPATIONAL HISTORY (122 questions) : Essentially negative.  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   PULMONARY TOXICITY HISTORY (27 items): Negative except prednisone use    Results for DOYLE, TEGETHOFF (MRN [id number]) as of 05/06/2019 12:01  Ref. Range 11/19/2018 but ? Done 02/28/2019 14:57  FVC-Pre Latest Units: L 2.29  FVC-%Pred-Pre Latest Units: % 70   Results for MAKENNAH, OMURA (MRN [id number]) as of 05/06/2019 12:01  Ref. Range 11/19/2018 but ? Done 02/28/2019 14:57  DLCO unc Latest Units: ml/min/mmHg 15.75  DLCO unc % pred Latest Units: % 77   Simple office walk 185 feet x  3 laps goal with forehead probe 05/06/2019   O2 used ra  Number laps completed 3  Comments about pace avg  Resting Pulse Ox/HR 98% and 90/min  Final Pulse Ox/HR 96% and 100/min  Desaturated </= 88% no  Desaturated <= 3% points no  Got Tachycardic >/= 90/min yes  Symptoms at end of test Mod dyspnea, knee pain  Miscellaneous comments x   She had a high-resolution CT chest September 24, 2018 at [facility].  This was read by our radiologist Dr Eddie Candle..  I do not have the image to visualized but I trust this report.  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  In addition is described a 1.4 cm right upper lobe posterior segment inferior part nodule that is subpleural and also 8 mm subpleural right middle lobe nodule.  He says these findings are unchanged compared to 2008  and are benign.  OV 06/03/2019 -telephone visit.  Patient identified with 2 person  identifier.  Risks, benefits and limitations of telephone visit explained.  Subjective:  Patient ID: Nichole Reyes, female , DOB: 06-29-1950 , age 61 y.o. , MRN: [id number] , ADDRESS: [address] [place] [address]   Clinical IPF diagnosis given dec 2020 and reiterated Mid Feb 2021:  based on your age greater than 65, clubbing on exam, CT scan with high probability for this condition, negative history on your questionnaire and negative blood test on your serology exams   06/03/2019 -  Fu IPF - focus on esbriet monitoring and any other questions she might have   HPI Nichole Reyes 69 y.o. - on Esbriet now. Started it 05/22/2019 Thursday. Currently on 2 pills tid. Taking it with food. Spacing 5-6h between dosing. No side effects at this point.  From a dyspnea standpoint she is stable.  She had questions about being able to do gardening.  We discussed the fact this could cause mold exposure and organic dust exposure that could irritate her IPF.  She says she can do it with masking.  Gardening gives her purpose so I supported it.  She also plan [place] last week and was quite short of breath and took an inhaler.  I have suggested that she use a pulse ox meter and monitor her pulse ox and call us if her pulse ox below 88% so we can order portable oxygen [obviously will have to do stress testing submaximal 6-minute walk test] otherwise doing well.     ROS - per HPI     has a past medical history of Diabetes mellitus without complication ([place]), Fibromyalgia, High cholesterol, Hypertension, and Osteoporosis.   reports that she has never smoked. She has never used smokeless tobacco.  Past Surgical History:  Procedure Laterality Date  . ABDOMINAL HYSTERECTOMY  2007  . CATARACT EXTRACTION Bilateral     Allergies  Allergen Reactions  . Latex   . Pseudoephedrine   . Sulfonamide Derivatives     Immunization History  Administered Date(s) Administered  . Fluad Quad(high Dose  65+) 01/29/2019  . Influenza Split 12/18/2017  . Influenza-Unspecified 11/18/2013    Family History  Problem Relation Age of Onset  . Hypertension Mother   . Diabetes Mother   . Osteoporosis Mother   . Hypertension Father   . Heart disease Father   . COPD Father   . Colon cancer Sister   . Cancer Brother   . Heart disease Brother   . Emphysema Paternal Grandfather      Current Outpatient Medications:  .  ACCU-CHEK AVIVA PLUS test strip, , Disp: , Rfl:  .  Accu-Chek Softclix Lancets lancets, , Disp: , Rfl:  .  albuterol (VENTOLIN HFA) 108 (90 Base) MCG/ACT inhaler, , Disp: , Rfl:  .  aspirin EC 81 MG tablet, Take 81 mg by mouth daily., Disp: , Rfl:  .  Blood Glucose Monitoring Suppl (ACCU-CHEK AVIVA PLUS) w/Device KIT, , Disp: , Rfl:  .  Calcium Carbonate (CALTRATE 600 PO), Take 1 tablet by mouth 2 (two) times a day., Disp: , Rfl:  .  clonazePAM (KLONOPIN) 0.5 MG tablet, Take 0.5 mg by mouth daily. , Disp: , Rfl:  .  ezetimibe (ZETIA) 10 MG tablet, TAKE 1 TABLET BY MOUTH ONCE DAILY., Disp: 30 tablet, Rfl: 2 .  fenofibrate (TRICOR) 48 MG tablet, Take 48 mg by mouth daily. , Disp: , Rfl:  .  fluticasone (FLOVENT HFA) 110 MCG/ACT inhaler, Inhale 1 puff into the lungs 2 (two) times daily., Disp: 1 Inhaler, Rfl: 12 .  lisinopril (ZESTRIL) 20 MG tablet, Take 1 tablet by mouth daily., Disp: , Rfl:  .  metFORMIN (GLUCOPHAGE-XR) 500 MG 24 hr tablet, Take 500 mg by mouth daily with breakfast. , Disp: , Rfl:  .  metoprolol succinate (TOPROL-XL) 50 MG 24 hr tablet, Take 50 mg by mouth daily. , Disp: , Rfl:  .  omeprazole (PRILOSEC) 20 MG capsule, Take 1 capsule (20 mg total) by mouth daily., Disp: 30 capsule, Rfl: 11 .  Pirfenidone (ESBRIET) 267 MG TABS, Take 1 tablet (267 mg total) by mouth 3 (three) times daily for 7 days, THEN 2 tablets (534 mg total) 3 (three) times daily for 7 days, THEN 3 tablets (801 mg total) 3 (three) times daily for 16 days., Disp: 207 tablet, Rfl: 0 .  SAVELLA 50 MG  TABS tablet, Take 50 mg by mouth 2 (two) times daily. , Disp: , Rfl:  .  tiZANidine (ZANAFLEX) 4 MG tablet, TAKE 1 TABLET BY MOUTH AT BEDTIME., Disp: 30 tablet, Rfl: 0      Objective:   There were no vitals filed for this visit.  Estimated body mass index is 29.85 kg/m as calculated from the following:   Height as of 05/06/19: '5\' 5"'$  (1.651 m).   Weight as of 05/06/19: 179 lb 6.4 oz (81.4 kg).  '@WEIGHTCHANGE'$ @  There were no vitals filed for this visit.   Physical Exam Sounded normal on the phone       Assessment:       ICD-10-CM   1. IPF (idiopathic pulmonary fibrosis) ([place])  J84.112   2. ILD (interstitial lung disease) ([place])  J84.9   3. Medication monitoring encounter  Z51.81   4. Gastroesophageal reflux disease, unspecified whether esophagitis present  K21.9        Plan:     Patient Instructions     ICD-10-CM   1. IPF (idiopathic pulmonary fibrosis) ([place])  J84.112   2. ILD (interstitial lung disease) ([place])  J84.9   3. Medication monitoring encounter  Z51.81   4. Gastroesophageal reflux disease, unspecified whether esophagitis present  K21.9     Glad you are tolerating week two of pirfenidone well  IPF itself is stable  Discussed activities and protection measures to take while gardening or climbing mountain   Plan  -Check liver function test today next few to several days to a week anytime  = Check at [facility] in [place]  -Continue pirfenidone per protocol -Buy a pulse ox meter and monitor your oxygen level with heavy exertion such as climbing mountains.  -If it drops below 88% then you need portable oxygen system -Continue Prilosec as before  P-in future can discuss dietary changes and GI referral if needed -Reassess cough at follow-up  -If significant we will discuss stopping lisinopril if not done by primary care already -Lung protection with gardening  -Wear a mask and monitor pulse ox -Hopefully of started pulmonary rehabilitation at [facility] by  the time of next visit  -Future thinks in disease management that we will discuss include pulmonary rehabilitation, patient support group, research trials and transplantation  Follow-up -Liver function test anytime now and repeat in 4 weeks: Do locally -4-5 weeks with nurse practitioner or myself for a telephone visit to discuss pirfenidone tolerance and monitoring -8 weeks spirometry and DLCO if possible -Return for a face-to-face meeting 30-minute slot with Dr. Chase Caller  in 8 weeks  -We will discuss diagnostics of bronchoscopy with lavage and RNA genomic transbronchial biopsy at follow-up.  ( Level 03: Esbt 20-29 min visit spent in visit type: telephone visit in total care time and counseling or/and coordination of care by this undersigned MD - Dr Brand Males. This includes one or more of the following all delivered on this same day 06/03/2019: pre-charting, chart review, note writing, documentation discussion of test results, diagnostic or treatment recommendations, prognosis, risks and benefits of management options, instructions, education, compliance or risk-factor reduction. It excludes time spent by the [place] or office staff in the care of the patient. Actual time was 20 min)    SIGNATURE    Dr. Brand Males, M.D., F.C.C.P,  Pulmonary and Critical Care Medicine Staff Physician, [place] Director - Interstitial Lung Disease  Program  Pulmonary [place] at [place], [place], [address]  Pager: [phone], If no answer or between  15:00h - 7:00h: call [phone] Telephone: [phone]  10:17 AM 06/03/2019

## 2019-06-03 NOTE — Patient Instructions (Addendum)
ICD-10-CM   1. IPF (idiopathic pulmonary fibrosis) ([place])  J84.112   2. ILD (interstitial lung disease) ([place])  J84.9   3. Medication monitoring encounter  Z51.81   4. Gastroesophageal reflux disease, unspecified whether esophagitis present  K21.9     Glad you are tolerating week two of pirfenidone well  IPF itself is stable  Discussed activities and protection measures to take while gardening or climbing mountain   Plan  -Check liver function test today next few to several days to a week anytime  = Check at [facility] in [place]  -Continue pirfenidone per protocol -Buy a pulse ox meter and monitor your oxygen level with heavy exertion such as climbing mountains.  -If it drops below 88% then you need portable oxygen system -Continue Prilosec as before  P-in future can discuss dietary changes and GI referral if needed -Reassess cough at follow-up  -If significant we will discuss stopping lisinopril if not done by primary care already -Lung protection with gardening  -Wear a mask and monitor pulse ox -Hopefully of started pulmonary rehabilitation at [facility] by the time of next visit  -Future thinks in disease management that we will discuss include pulmonary rehabilitation, patient support group, research trials and transplantation  Follow-up -Liver function test anytime now and repeat in 4 weeks: Do locally -4-5 weeks with nurse practitioner or myself for a telephone visit to discuss pirfenidone tolerance and monitoring -8 weeks spirometry and DLCO if possible -Return for a face-to-face meeting 30-minute slot with Dr. Chase Caller in 8 weeks  -We will discuss diagnostics of bronchoscopy with lavage and RNA genomic transbronchial biopsy at follow-up.

## 2019-06-04 ENCOUNTER — Other Ambulatory Visit (HOSPITAL_COMMUNITY)
Admission: RE | Admit: 2019-06-04 | Discharge: 2019-06-04 | Disposition: A | Discharge status: Home / Self Care | Payer: Medicare Other | Source: Ambulatory Visit | Attending: Internal Medicine | Admitting: Internal Medicine

## 2019-06-04 DIAGNOSIS — Z5181 Encounter for therapeutic drug level monitoring: Secondary | ICD-10-CM | POA: Insufficient documentation

## 2019-06-04 LAB — HEPATIC FUNCTION PANEL
ALT: 24 U/L (ref 0–44)
AST: 24 U/L (ref 15–41)
Albumin: 4.1 g/dL (ref 3.5–5.0)
Alkaline Phosphatase: 70 U/L (ref 38–126)
Bilirubin, Direct: <0.1 mg/dL (ref 0.0–0.2)
Total Bilirubin: 0.5 mg/dL (ref 0.3–1.2)
Total Protein: 7.7 g/dL (ref 6.5–8.1)

## 2019-06-07 NOTE — Progress Notes (Signed)
Lft normal  Lab             06/04/19                      1319         AST          24           ALT          24           ALKPHOS      70           BILITOT      0.5          PROT         7.7          ALBUMIN      4.1

## 2019-06-07 NOTE — Progress Notes (Signed)
Lab             06/04/19                      1319         AST          24           ALT          24           ALKPHOS      70           BILITOT      0.5          PROT         7.7          ALBUMIN      4.1

## 2019-06-09 ENCOUNTER — Other Ambulatory Visit: Payer: Self-pay

## 2019-06-09 ENCOUNTER — Encounter (HOSPITAL_COMMUNITY)
Admission: RE | Admit: 2019-06-09 | Discharge: 2019-06-09 | Disposition: A | Discharge status: Home / Self Care | Payer: Medicare Other | Source: Ambulatory Visit | Attending: Internal Medicine | Admitting: Internal Medicine

## 2019-06-17 DIAGNOSIS — I10 Essential (primary) hypertension: Secondary | ICD-10-CM | POA: Diagnosis not present

## 2019-06-18 DIAGNOSIS — R42 Dizziness and giddiness: Secondary | ICD-10-CM | POA: Diagnosis not present

## 2019-06-18 DIAGNOSIS — R519 Headache, unspecified: Secondary | ICD-10-CM | POA: Diagnosis not present

## 2019-06-18 DIAGNOSIS — J0101 Acute recurrent maxillary sinusitis: Secondary | ICD-10-CM | POA: Diagnosis not present

## 2019-06-30 NOTE — Progress Notes (Addendum)
Interstitial Lung Disease Multidisciplinary Conference   Nichole Reyes    MRN [id number]    DOB Mar 15, 1951  Primary Care 6, Lennette Bihari, MD  Referring Physician: Dr Carlis Abbott and Gerome Apley  Time of Conference: 7.30am- 8.30am Date of conference: 05/27/2019 Location of Conference: -  Virtual  Participating Pulmonary: Dr. Brand Males, MD - yes,  Dr Marshell Garfinkel, MD - yes Pathology: Dr Jaquita Folds, MD - yes , Dr Enid Cutter - no Radiology: Dr Salvatore Marvel MD - no, Dr Vinnie Langton MD - yes,  Dr Lorin Picket, MD - no , Dr Eddie Candle - no Others: no  Brief History: ILD workup. Scan done outside [facility] in July 2020 but not availa le in our PACS so case brought for discussion with focus only on radiology  Serology: x  MDD discussion of CT scan    - Date or time period of scan: July 2020  - Features mentioned:    - What is the final conclusion per 2018 ATS/Fleischner Criteria - Dr Weber Cooks agreed with original read of Probable UIP. No HC. No definite CCG  Pathology discussion of biopsy x:  PFTs: x  Labs: x*  MDD Impression/Recs: This is IPF   Time Spent in preparation and discussion:  > 30 min    SIGNATURE   Dr. Brand Males, M.D., F.C.C.P,  Pulmonary and Critical Care Medicine Staff Physician, [place] Director - Interstitial Lung Disease  Program  Pulmonary [place] at [place], [place], [address]  Pager: [phone], If no answer or between  15:00h - 7:00h: call [phone] Telephone: [phone]  8:37 AM 06/30/2019 ...................................................................................................................Marland Kitchen References: Diagnosis of Hypersensitivity Pneumonitis in Adults. An Official ATS/JRS/ALAT Clinical Practice Guideline. Ragu G et al, [place] Aug 1;202(3):e36-e69.       Diagnosis of Idiopathic Pulmonary  Fibrosis. An Official ATS/ERS/JRS/ALAT Clinical Practice Guideline. Raghu G et al, [place]. 2018 Sep 1;198(5):e44-e68.   IPF Suspected   Histopath ology Pattern      UIP  Probable UIP  Indeterminate for  UIP  Alternative  diagnosis    UIP  IPF  IPF  IPF  Non-IPF dx   HRCT   Probabe UIP  IPF  IPF  IPF (Likely)**  Non-IPF dx  Pattern  Indeterminate for UIP  IPF  IPF (Likely)**  Indeterminate  for IPF**  Non-IPF dx    Alternative diagnosis  IPF (Likely)**/ non-IPF dx  Non-IPF dx  Non-IPF dx  Non-IPF dx     Idiopathic pulmonary fibrosis diagnosis based upon HRCT and Biopsy paterns.  ** IPF is the likely diagnosis when any of following features are present:  Moderate-to-severe traction bronchiectasis/bronchiolectasis (defined as mild traction bronchiectasis/bronchiolectasis in four or more lobes including the lingual as a lobe, or moderate to severe traction bronchiectasis in two or more lobes) in a man over age 64 years or in a woman over age 81 years Extensive (>30%) reticulation on HRCT and an age >70 years  Increased neutrophils and/or absence of lymphocytosis in BAL fluid  Multidisciplinary discussion reaches a confident diagnosis of IPF.   **Indeterminate for IPF  Without an adequate biopsy is unlikely to be IPF  With an adequate biopsy may be reclassified to a more specific diagnosis after multidisciplinary discussion and/or additional consultation.   dx = diagnosis; HRCT = high-resolution computed tomography; IPF = idiopathic pulmonary fibrosis; UIP = usual interstitial  pneumonia.

## 2019-07-03 ENCOUNTER — Other Ambulatory Visit: Payer: Self-pay | Admitting: Rheumatology

## 2019-07-03 NOTE — Telephone Encounter (Signed)
Last Visit: 02/20/2019 telemedicine  Next Visit: 09/05/2019  Okay to refill per Dr. Deveshwar  

## 2019-07-08 ENCOUNTER — Other Ambulatory Visit: Payer: Self-pay

## 2019-07-08 ENCOUNTER — Ambulatory Visit (INDEPENDENT_AMBULATORY_CARE_PROVIDER_SITE_OTHER): Payer: Medicare Other | Admitting: Primary Care

## 2019-07-08 ENCOUNTER — Telehealth: Payer: Self-pay | Admitting: Primary Care

## 2019-07-08 DIAGNOSIS — J84112 Idiopathic pulmonary fibrosis: Secondary | ICD-10-CM

## 2019-07-08 NOTE — Patient Instructions (Addendum)
  Labs now or in a few days   Follow up in office in 4 weeks 30 min slot with MR ILD clinic

## 2019-07-08 NOTE — Progress Notes (Signed)
Virtual Visit via Telephone Note  I connected with Nichole Reyes on 07/08/19 at 10:30 AM EDT by telephone and verified that I am speaking with the correct person using two identifiers.  Location: Patient: Home Provider: Home   I discussed the limitations, risks, security and privacy concerns of performing an evaluation and management service by telephone and the availability of in person appointments. I also discussed with the patient that there may be a patient responsible charge related to this service. The patient expressed understanding and agreed to proceed.   History of Present Illness: 69 year old female, never smoked. PMH significant for ILD, fibromyalgia, osteoarthritis, hiatal hernia, GERD. Patient of Dr. Chase Caller, most recently seen on 06/03/19. Reports dyspnea since February 2020. She has had a negative cardiac elevation. CT chest demonstrates fibrotic changes.  DX IPD given in December 2020 and started on nitedanib, due to insurance this was delayed. Started on Esbriet 05/22/19, currently on 2 pills TID.  07/08/2019 She has been taking Esbriet 3 tabs three times a day. She is tolerating medication pretty well. She has mild nausea when she takes her medication but states that it doesn't last long. States that her cough is better, she feels this was more related to her allergies. Cough is productive with clear mucus. Worse at night. Denies heat burn or reflux. No nocturnal symptoms. She has not been gardening yet d/t her dyspnea. Her oxygen level will drop to 88-87% when walking her daughter dog. She has not started pulmonary rehab yet but she has a date to start on May 26th.    Observations/Objective:  - Able to speak in full sentences; no overt shortness of breath or wheezing   She had a high-resolution CT chest September 24, 2018 at [facility].  This was read by our radiologist Dr Eddie Candle..  I do not have the image to visualized but I trust this report.  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  In addition is described a 1.4 cm right upper lobe posterior segment inferior part nodule that is subpleural and also 8 mm subpleural right middle lobe nodule.  He says these findings are unchanged compared to 2008 and are benign.  Assessment and Plan:  ILD: - Cough has improved - Tolerating Esbriet (pirfenidone) 2 tabs TID- only SE is some mild nausea which is tolerable  - LFTs were normal in March and April; continues to check q 4 weeks for 6 months  - Needs spirometry in 4 weeks with DLCO  - Discuss diagnostics of bronchoscopy with lavage and RNA genomic transbronchial biopsy at follow-up  GERD: - Continues omeprazole 20mg  daily  Follow Up Instructions:  -4 weeks with LFTs and Spirometry  I discussed the assessment and treatment plan with the patient. The patient was provided an opportunity to ask questions and all were answered. The patient agreed with the plan and demonstrated an understanding of the instructions.   The patient was advised to call back or seek an in-person evaluation if the symptoms worsen or if the condition fails to improve as anticipated.  I provided 18 minutes of non-face-to-face time during this encounter.   [name], NP

## 2019-07-08 NOTE — Telephone Encounter (Signed)
Patient has a conflict with June 4th pre-op covid testing for PFTs. Can we see if we can reschedule them to the week prior to that. Also MR had wanted to see her for follow up in ILD clinic for 30 slot. Does he have anything available around this time.

## 2019-07-10 ENCOUNTER — Other Ambulatory Visit (HOSPITAL_COMMUNITY)
Admission: RE | Admit: 2019-07-10 | Discharge: 2019-07-10 | Disposition: A | Discharge status: Home / Self Care | Payer: Medicare Other | Source: Ambulatory Visit | Attending: Primary Care | Admitting: Primary Care

## 2019-07-10 DIAGNOSIS — J84112 Idiopathic pulmonary fibrosis: Secondary | ICD-10-CM | POA: Insufficient documentation

## 2019-07-10 LAB — HEPATIC FUNCTION PANEL
ALT: 22 U/L (ref 0–44)
AST: 22 U/L (ref 15–41)
Albumin: 4.3 g/dL (ref 3.5–5.0)
Alkaline Phosphatase: 74 U/L (ref 38–126)
Bilirubin, Direct: <0.1 mg/dL (ref 0.0–0.2)
Total Bilirubin: 0.6 mg/dL (ref 0.3–1.2)
Total Protein: 7.7 g/dL (ref 6.5–8.1)

## 2019-07-14 DIAGNOSIS — M25561 Pain in right knee: Secondary | ICD-10-CM | POA: Diagnosis not present

## 2019-07-14 DIAGNOSIS — M545 Low back pain: Secondary | ICD-10-CM | POA: Diagnosis not present

## 2019-07-14 NOTE — Telephone Encounter (Signed)
Left a voicemail on patient's phone regarding PFT's in June . Per Marinus Maw would like to see patient in his ILD Clinic.Trying to see if patient would like to change her Appt for PFT.

## 2019-07-14 NOTE — Telephone Encounter (Signed)
Patient is returning phone call. Patient phone number is [phone].

## 2019-07-16 NOTE — Telephone Encounter (Signed)
Spoke with patient regarding trying to change her PFT ahead a week because patient will have her grandaughter the day of her COVID test. Offered patient a new appt day May 27th at 4:00pm for her PFT and she would need to do her COVID test on Monday May 24th. Patient stated she didn't want to come to our office that late and she will just keep her PFT appt on 6/8. Patient voice was understanding. Nothing else further needed.

## 2019-07-18 DIAGNOSIS — M797 Fibromyalgia: Secondary | ICD-10-CM | POA: Diagnosis not present

## 2019-07-18 DIAGNOSIS — E785 Hyperlipidemia, unspecified: Secondary | ICD-10-CM | POA: Diagnosis not present

## 2019-07-18 DIAGNOSIS — I10 Essential (primary) hypertension: Secondary | ICD-10-CM | POA: Diagnosis not present

## 2019-07-28 ENCOUNTER — Other Ambulatory Visit: Payer: Self-pay | Admitting: Rheumatology

## 2019-07-28 NOTE — Telephone Encounter (Signed)
Last Visit: 02/20/2019 telemedicine  Next Visit: 09/05/2019  Okay to refill per Dr. Deveshwar  

## 2019-08-04 DIAGNOSIS — G4730 Sleep apnea, unspecified: Secondary | ICD-10-CM | POA: Diagnosis not present

## 2019-08-04 DIAGNOSIS — E7800 Pure hypercholesterolemia, unspecified: Secondary | ICD-10-CM | POA: Diagnosis not present

## 2019-08-04 DIAGNOSIS — R739 Hyperglycemia, unspecified: Secondary | ICD-10-CM | POA: Diagnosis not present

## 2019-08-04 DIAGNOSIS — M25551 Pain in right hip: Secondary | ICD-10-CM | POA: Diagnosis not present

## 2019-08-04 DIAGNOSIS — M4000 Postural kyphosis, site unspecified: Secondary | ICD-10-CM | POA: Diagnosis not present

## 2019-08-11 ENCOUNTER — Encounter (HOSPITAL_COMMUNITY): Payer: Self-pay

## 2019-08-11 ENCOUNTER — Encounter (HOSPITAL_COMMUNITY): Payer: Medicare Other

## 2019-08-12 ENCOUNTER — Encounter: Payer: Self-pay | Admitting: Rheumatology

## 2019-08-12 DIAGNOSIS — M8588 Other specified disorders of bone density and structure, other site: Secondary | ICD-10-CM | POA: Diagnosis not present

## 2019-08-12 DIAGNOSIS — N958 Other specified menopausal and perimenopausal disorders: Secondary | ICD-10-CM | POA: Diagnosis not present

## 2019-08-12 DIAGNOSIS — Z1231 Encounter for screening mammogram for malignant neoplasm of breast: Secondary | ICD-10-CM | POA: Diagnosis not present

## 2019-08-13 ENCOUNTER — Other Ambulatory Visit: Payer: Self-pay

## 2019-08-13 ENCOUNTER — Encounter (HOSPITAL_COMMUNITY)
Admission: RE | Admit: 2019-08-13 | Discharge: 2019-08-13 | Disposition: A | Discharge status: Home / Self Care | Payer: Medicare Other | Source: Ambulatory Visit | Attending: Internal Medicine | Admitting: Internal Medicine

## 2019-08-13 VITALS — BP 120/70 | HR 80 | Ht 65.0 in | Wt 173.8 lb

## 2019-08-13 DIAGNOSIS — E119 Type 2 diabetes mellitus without complications: Secondary | ICD-10-CM | POA: Insufficient documentation

## 2019-08-13 DIAGNOSIS — Z7984 Long term (current) use of oral hypoglycemic drugs: Secondary | ICD-10-CM | POA: Insufficient documentation

## 2019-08-13 DIAGNOSIS — I10 Essential (primary) hypertension: Secondary | ICD-10-CM | POA: Insufficient documentation

## 2019-08-13 DIAGNOSIS — Z7901 Long term (current) use of anticoagulants: Secondary | ICD-10-CM | POA: Diagnosis not present

## 2019-08-13 DIAGNOSIS — Z79899 Other long term (current) drug therapy: Secondary | ICD-10-CM | POA: Insufficient documentation

## 2019-08-13 DIAGNOSIS — M810 Age-related osteoporosis without current pathological fracture: Secondary | ICD-10-CM | POA: Insufficient documentation

## 2019-08-13 DIAGNOSIS — Z7982 Long term (current) use of aspirin: Secondary | ICD-10-CM | POA: Insufficient documentation

## 2019-08-13 DIAGNOSIS — E7800 Pure hypercholesterolemia, unspecified: Secondary | ICD-10-CM | POA: Insufficient documentation

## 2019-08-13 DIAGNOSIS — J849 Interstitial pulmonary disease, unspecified: Secondary | ICD-10-CM | POA: Insufficient documentation

## 2019-08-13 DIAGNOSIS — M797 Fibromyalgia: Secondary | ICD-10-CM | POA: Diagnosis not present

## 2019-08-13 NOTE — Progress Notes (Signed)
Cardiac/Pulmonary Rehab Medication Review by a Pharmacist  Does the patient  feel that his/her medications are working for him/her?  yes  Has the patient been experiencing any side effects to the medications prescribed?  Yes- rash/itching with sun exposure with new medication Esbriet.  Does the patient measure his/her own blood pressure or blood glucose at home?  yes   Does the patient have any problems obtaining medications due to transportation or finances?   no  Understanding of regimen: excellent Understanding of indications: excellent Potential of compliance: excellent  Questions asked to Determine Patient Understanding of Medication Regimen:  1. What is the name of the medication?  2. What is the medication used for?  3. When should it be taken?  4. How much should be taken?  5. How will you take it?  6. What side effects should you report?  Understanding Defined as: Excellent: All questions above are correct Good: Questions 1-4 are correct Fair: Questions 1-2 are correct  Poor: 1 or none of the above questions are correct   Pharmacist comments: Overall, patient has great understanding of medications and high compliance.  She recently started on Esbriet and states gets a bit of a rash/itching with sun exposure.  Discussed with patient that a common side effect of the medication and to discuss with MD at appointment.  She currently takes Zyrtec but talked about possible topical over the counter medications to take if bothersome.    Ramond Craver 08/13/2019 9:01 AM

## 2019-08-13 NOTE — Progress Notes (Signed)
Pulmonary Individual Treatment Plan  Patient Details  Name: Nichole Reyes MRN: [id number] Date of Birth: [birth year] Referring Provider:     PULMONARY REHAB OTHER RESP ORIENTATION from 08/13/2019 in [place]  Referring Provider  Dr. Chase Caller      Initial Encounter Date:    [place] from 08/13/2019 in [place]  Date  08/13/19      Visit Diagnosis: ILD (interstitial lung disease) ([place])  Patient's Home Medications on Admission:   Current Outpatient Medications:  .  Pirfenidone (ESBRIET) 267 MG TABS, Take 3 tablets by mouth 3 (three) times daily. 801 mg three times daily, Disp: , Rfl:  .  ACCU-CHEK AVIVA PLUS test strip, , Disp: , Rfl:  .  Accu-Chek Softclix Lancets lancets, , Disp: , Rfl:  .  albuterol (VENTOLIN HFA) 108 (90 Base) MCG/ACT inhaler, 2 puffs every 4 (four) hours as needed. , Disp: , Rfl:  .  aspirin EC 81 MG tablet, Take 81 mg by mouth daily., Disp: , Rfl:  .  Blood Glucose Monitoring Suppl (ACCU-CHEK AVIVA PLUS) w/Device KIT, , Disp: , Rfl:  .  Calcium Carbonate (CALTRATE 600 PO), Take 1 tablet by mouth 2 (two) times a day., Disp: , Rfl:  .  clonazePAM (KLONOPIN) 0.5 MG tablet, Take 0.5 mg by mouth daily. , Disp: , Rfl:  .  ezetimibe (ZETIA) 10 MG tablet, TAKE 1 TABLET BY MOUTH ONCE DAILY., Disp: 30 tablet, Rfl: 2 .  fenofibrate (TRICOR) 48 MG tablet, Take 48 mg by mouth daily. , Disp: , Rfl:  .  fluticasone (FLOVENT HFA) 110 MCG/ACT inhaler, Inhale 1 puff into the lungs 2 (two) times daily., Disp: 1 Inhaler, Rfl: 12 .  lisinopril (ZESTRIL) 20 MG tablet, Take 1 tablet by mouth daily., Disp: , Rfl:  .  metFORMIN (GLUCOPHAGE-XR) 500 MG 24 hr tablet, Take 500 mg by mouth daily with breakfast. , Disp: , Rfl:  .  metoprolol succinate (TOPROL-XL) 50 MG 24 hr tablet, Take 50 mg by mouth daily. , Disp: , Rfl:  .  omeprazole (PRILOSEC) 20 MG capsule, Take 1 capsule (20 mg total) by mouth daily.,  Disp: 30 capsule, Rfl: 11 .  SAVELLA 50 MG TABS tablet, Take 50 mg by mouth 2 (two) times daily. , Disp: , Rfl:  .  tiZANidine (ZANAFLEX) 4 MG tablet, TAKE 1 TABLET BY MOUTH AT BEDTIME., Disp: 30 tablet, Rfl: 0  Past Medical History: Past Medical History:  Diagnosis Date  . Diabetes mellitus without complication ([place])   . Fibromyalgia   . High cholesterol   . Hypertension   . Osteoporosis     Tobacco Use: Social History   Tobacco Use  Smoking Status Never Smoker  Smokeless Tobacco Never Used    Labs: Recent Review Flowsheet Data    There is no flowsheet data to display.      Capillary Blood Glucose: No results found for: GLUCAP   Pulmonary Assessment Scores: Pulmonary Assessment Scores    Row Name 08/13/19 1143         ADL UCSD   ADL Phase  Entry     SOB Score total  78     Walk  10     Stairs  2     Bath  2     Dress  3     Shop  5       CAT Score   CAT Score  23  mMRC Score   mMRC Score  3       UCSD: Self-administered rating of dyspnea associated with activities of daily living (ADLs) 6-point scale (0 = "not at all" to 5 = "maximal or unable to do because of breathlessness")  Scoring Scores range from 0 to 120.  Minimally important difference is 5 units  CAT: CAT can identify the health impairment of COPD patients and is better correlated with disease progression.  CAT has a scoring range of zero to 40. The CAT score is classified into four groups of low (less than 10), medium (10 - 20), high (21-30) and very high (31-40) based on the impact level of disease on health status. A CAT score over 10 suggests significant symptoms.  A worsening CAT score could be explained by an exacerbation, poor medication adherence, poor inhaler technique, or progression of COPD or comorbid conditions.  CAT MCID is 2 points  mMRC: mMRC (Modified Medical Research Council) Dyspnea Scale is used to assess the degree of baseline functional disability in patients of  respiratory disease due to dyspnea. No minimal important difference is established. A decrease in score of 1 point or greater is considered a positive change.   Pulmonary Function Assessment: Pulmonary Function Assessment - 08/13/19 1141      Pulmonary Function Tests   FVC%  70 %    FEV1%  82 %    FEV1/FVC Ratio  116    RV%  50 %    DLCO%  77 %      Initial Spirometry Results   FVC%  70 %    FEV1%  82 %    FEV1/FVC Ratio  116      Post Bronchodilator Spirometry Results   FVC%  70 %    FEV1%  83 %    FEV1/FVC Ratio  117      Breath   Bilateral Breath Sounds  Clear    Shortness of Breath  Yes       Exercise Target Goals: Exercise Program Goal: Individual exercise prescription set using results from initial 6 min walk test and THRR while considering  patient's activity barriers and safety.   Exercise Prescription Goal: Initial exercise prescription builds to 30-45 minutes a day of aerobic activity, 2-3 days per week.  Home exercise guidelines will be given to patient during program as part of exercise prescription that the participant will acknowledge.  Activity Barriers & Risk Stratification: Activity Barriers & Cardiac Risk Stratification - 08/13/19 1102      Activity Barriers & Cardiac Risk Stratification   Activity Barriers  Deconditioning;Shortness of Breath;Other (comment)    Comments  knee pain 5/10    Cardiac Risk Stratification  Low       6 Minute Walk: 6 Minute Walk    Row Name 08/13/19 1104         6 Minute Walk   Phase  Initial     Distance  1100 feet     Walk Time  6 minutes     MPH  2.08     METS  2.08     RPE  13     Perceived Dyspnea   13     VO2 Peak  9.04     Symptoms  Yes (comment)     Comments  knee pain     Resting HR  80 bpm     Resting BP  120/70     Resting Oxygen Saturation   97 %  Exercise Oxygen Saturation  during 6 min walk  98 %     Max Ex. HR  101 bpm     Max Ex. BP  120/82     2 Minute Post BP  110/80         Oxygen Initial Assessment: Oxygen Initial Assessment - 08/13/19 1141      Home Oxygen   Home Oxygen Device  None    Sleep Oxygen Prescription  None    Home Exercise Oxygen Prescription  None    Home at Rest Exercise Oxygen Prescription  None    Compliance with Home Oxygen Use  No      Initial 6 min Walk   Oxygen Used  None      Program Oxygen Prescription   Program Oxygen Prescription  None       Oxygen Re-Evaluation:   Oxygen Discharge (Final Oxygen Re-Evaluation):   Initial Exercise Prescription: Initial Exercise Prescription - 08/13/19 1100      Date of Initial Exercise RX and Referring Provider   Date  08/13/19    Referring Provider  Dr. Chase Caller    Expected Discharge Date  11/13/19      Treadmill   MPH  1.1    Grade  0    Minutes  17    METs  1.84      Recumbant Elliptical   Level  1    RPM  45    Watts  55    Minutes  22    METs  3.1      Prescription Details   Frequency (times per week)  2    Duration  Progress to 30 minutes of continuous aerobic without signs/symptoms of physical distress      Intensity   THRR 40-80% of Max Heartrate  [phone]    Ratings of Perceived Exertion  11-13    Perceived Dyspnea  0-4      Progression   Progression  Continue progressive overload as per policy without signs/symptoms or physical distress.      Resistance Training   Training Prescription  Yes    Weight  1    Reps  10-15       Perform Capillary Blood Glucose checks as needed.  Exercise Prescription Changes:   Exercise Comments:  Exercise Comments    Row Name 08/13/19 1029           Exercise Comments  Patient completed walk test. Patient was short of breath and she did complain of knee concerns before starting. During walk test pain was 5/10 and pain subsided after two minute rest.  Patients gait was also comprimised during walking. Patient is looking foward to starting the program.          Exercise Goals and Review:  Exercise  Goals    Row Name 08/13/19 1034             Exercise Goals   Increase Physical Activity  Yes       Intervention  Provide advice, education, support and counseling about physical activity/exercise needs.;Develop an individualized exercise prescription for aerobic and resistive training based on initial evaluation findings, risk stratification, comorbidities and participant's personal goals.       Expected Outcomes  Long Term: Add in home exercise to make exercise part of routine and to increase amount of physical activity.;Short Term: Attend rehab on a regular basis to increase amount of physical activity.       Increase Strength and Stamina  Yes       Intervention  Provide advice, education, support and counseling about physical activity/exercise needs.;Develop an individualized exercise prescription for aerobic and resistive training based on initial evaluation findings, risk stratification, comorbidities and participant's personal goals.       Expected Outcomes  Short Term: Perform resistance training exercises routinely during rehab and add in resistance training at home;Long Term: Improve cardiorespiratory fitness, muscular endurance and strength as measured by increased METs and functional capacity (6MWT)       Able to understand and use rate of perceived exertion (RPE) scale  Yes       Intervention  Provide education and explanation on how to use RPE scale       Expected Outcomes  Short Term: Able to use RPE daily in rehab to express subjective intensity level;Long Term:  Able to use RPE to guide intensity level when exercising independently       Able to understand and use Dyspnea scale  Yes       Intervention  Provide education and explanation on how to use Dyspnea scale       Expected Outcomes  Short Term: Able to use Dyspnea scale daily in rehab to express subjective sense of shortness of breath during exertion;Long Term: Able to use Dyspnea scale to guide intensity level when exercising  independently       Knowledge and understanding of Target Heart Rate Range (THRR)  Yes       Intervention  Provide education and explanation of THRR including how the numbers were predicted and where they are located for reference       Expected Outcomes  Long Term: Able to use THRR to govern intensity when exercising independently;Short Term: Able to state/look up THRR       Able to check pulse independently  Yes       Intervention  Provide education and demonstration on how to check pulse in carotid and radial arteries.;Review the importance of being able to check your own pulse for safety during independent exercise       Expected Outcomes  Short Term: Able to explain why pulse checking is important during independent exercise;Long Term: Able to check pulse independently and accurately       Understanding of Exercise Prescription  Yes       Intervention  Provide education, explanation, and written materials on patient's individual exercise prescription       Expected Outcomes  Short Term: Able to explain program exercise prescription;Long Term: Able to explain home exercise prescription to exercise independently          Exercise Goals Re-Evaluation :   Discharge Exercise Prescription (Final Exercise Prescription Changes):   Nutrition:  Target Goals: Understanding of nutrition guidelines, daily intake of sodium <1574m, cholesterol <2052m calories 30% from fat and 7% or less from saturated fats, daily to have 5 or more servings of fruits and vegetables.  Biometrics: Pre Biometrics - 08/13/19 1026      Pre Biometrics   Height  _0  (1.651 m)    Weight  173 lb 12.8 oz (78.8 kg)    Waist Circumference  41 inches    Hip Circumference  41 inches    Waist to Hip Ratio  1 %    BMI (Calculated)  28.92    Triceps Skinfold  21 mm    % Body Fat  40 %    Grip Strength  24.23 kg    Flexibility  14.33 in  Single Leg Stand  4.27 seconds        Nutrition Therapy Plan and Nutrition  Goals: Nutrition Therapy & Goals - 08/13/19 1147      Personal Nutrition Goals   Personal Goal #2  Patient is working on eating heart healthy. Handouts where given discussing heart healthy eating. Patient expressed an understanding.      Intervention Plan   Intervention  Nutrition handout(s) given to patient.       Nutrition Assessments: Nutrition Assessments - 08/13/19 1149      MEDFICTS Scores   Pre Score  56       Nutrition Goals Re-Evaluation:   Nutrition Goals Discharge (Final Nutrition Goals Re-Evaluation):   Psychosocial: Target Goals: Acknowledge presence or absence of significant depression and/or stress, maximize coping skills, provide positive support system. Participant is able to verbalize types and ability to use techniques and skills needed for reducing stress and depression.  Initial Review & Psychosocial Screening: Initial Psych Review & Screening - 08/13/19 1144      Initial Review   Current issues with  Current Depression   Mostly due to new diagnosis and being sad when thinkin of her mother.     Family Dynamics   Good Support System?  Yes      Barriers   Psychosocial barriers to participate in program  There are no identifiable barriers or psychosocial needs.;The patient should benefit from training in stress management and relaxation.      Screening Interventions   Interventions  Encouraged to exercise    Expected Outcomes  Short Term goal: Identification and review with participant of any Quality of Life or Depression concerns found by scoring the questionnaire.;Long Term goal: The participant improves quality of Life and PHQ9 Scores as seen by post scores and/or verbalization of changes       Quality of Life Scores: Quality of Life - 08/13/19 1024      Quality of Life   Select  Quality of Life      Quality of Life Scores   Health/Function Pre  13.5 %    Socioeconomic Pre  29.14 %    Psych/Spiritual Pre  24.43 %    Family Pre  30 %     GLOBAL Pre  21.17 %      Scores of 19 and below usually indicate a poorer quality of life in these areas.  A difference of  2-3 points is a clinically meaningful difference.  A difference of 2-3 points in the total score of the Quality of Life Index has been associated with significant improvement in overall quality of life, self-image, physical symptoms, and general health in studies assessing change in quality of life.   PHQ-9: Recent Review Flowsheet Data    Depression screen [facility] 2/9 08/13/2019   Decreased Interest 0   Down, Depressed, Hopeless 2   PHQ - 2 Score 2   Altered sleeping 2   Tired, decreased energy 3   Change in appetite 1   Feeling bad or failure about yourself  0   Trouble concentrating 0   Moving slowly or fidgety/restless 0   Suicidal thoughts 0   PHQ-9 Score 8   Difficult doing work/chores Somewhat difficult     Interpretation of Total Score  Total Score Depression Severity:  1-4 = Minimal depression, 5-9 = Mild depression, 10-14 = Moderate depression, 15-19 = Moderately severe depression, 20-27 = Severe depression   Psychosocial Evaluation and Intervention: Psychosocial Evaluation - 08/13/19 1145  Psychosocial Evaluation & Interventions   Interventions  Encouraged to exercise with the program and follow exercise prescription    Comments  Patient is really feeling she needs to be able to get back to exercise and her regular ADL's and she will feel better.    Expected Outcomes  To be able to get back to the gym, working her flower beds and walking the trails.    Continue Psychosocial Services   No Follow up required       Psychosocial Re-Evaluation:   Psychosocial Discharge (Final Psychosocial Re-Evaluation):    Education: Education Goals: Education classes will be provided on a weekly basis, covering required topics. Participant will state understanding/return demonstration of topics presented.  Learning Barriers/Preferences: Learning  Barriers/Preferences - 08/13/19 1149      Learning Barriers/Preferences   Learning Barriers  None    Learning Preferences  Computer/Internet;Group Instruction;Individual Instruction;Pictoral;Written Material;Verbal Instruction;Skilled Demonstration;Video       Education Topics: How Lungs Work and Diseases: - Discuss the anatomy of the lungs and diseases that can affect the lungs, such as COPD.   Exercise: -Discuss the importance of exercise, FITT principles of exercise, normal and abnormal responses to exercise, and how to exercise safely.   Environmental Irritants: -Discuss types of environmental irritants and how to limit exposure to environmental irritants.   Meds/Inhalers and oxygen: - Discuss respiratory medications, definition of an inhaler and oxygen, and the proper way to use an inhaler and oxygen.   Energy Saving Techniques: - Discuss methods to conserve energy and decrease shortness of breath when performing activities of daily living.    Bronchial Hygiene / Breathing Techniques: - Discuss breathing mechanics, pursed-lip breathing technique,  proper posture, effective ways to clear airways, and other functional breathing techniques   Cleaning Equipment: - Provides group verbal and written instruction about the health risks of elevated stress, cause of high stress, and healthy ways to reduce stress.   Nutrition I: Fats: - Discuss the types of cholesterol, what cholesterol does to the body, and how cholesterol levels can be controlled.   Nutrition II: Labels: -Discuss the different components of food labels and how to read food labels.   Respiratory Infections: - Discuss the signs and symptoms of respiratory infections, ways to prevent respiratory infections, and the importance of seeking medical treatment when having a respiratory infection.   Stress I: Signs and Symptoms: - Discuss the causes of stress, how stress may lead to anxiety and depression, and  ways to limit stress.   Stress II: Relaxation: -Discuss relaxation techniques to limit stress.   Oxygen for Home/Travel: - Discuss how to prepare for travel when on oxygen and proper ways to transport and store oxygen to ensure safety.   Knowledge Questionnaire Score: Knowledge Questionnaire Score - 08/13/19 1150      Knowledge Questionnaire Score   Pre Score  14/18       Core Components/Risk Factors/Patient Goals at Admission: Personal Goals and Risk Factors at Admission - 08/13/19 1150      Core Components/Risk Factors/Patient Goals on Admission    Weight Management  Weight Maintenance    Personal Goal Other  Yes    Personal Goal  Patient is hoping she will be able to do the things she use to do like going to the gym, working her flower beds, and walking the trials.    Intervention  Attend PR 2 x week and to supplement with home exercise plan 3 x week.    Expected Outcomes  Reach  expected goals.       Core Components/Risk Factors/Patient Goals Review:    Core Components/Risk Factors/Patient Goals at Discharge (Final Review):    ITP Comments: ITP Comments    Row Name 08/13/19 1130           ITP Comments  Patient is eager to get started.          Comments: Patient arrived for 1st visit/orientation/education at 0800.  Patient was referred to PR by Dr. Chase Caller due to ILD (J84.9). During orientation advised patient on arrival and appointment times what to wear, what to do before, during and after exercise. Reviewed attendance and class policy. Talked about inclement weather and class consultation policy. Pt is scheduled to return Pulmonary Rehab on 08/19/2019 at 1045. Pt was advised to come to class 15 minutes before class starts. Patient was also given instructions on meeting with the dietician and attending the Family Structure classes. Discussed RPE/Dpysnea scales. Discussed initial THR and how to find their radial and/or carotid pulse. Discussed the initial exercise  prescription and how this effects their progress. Pt is eager to get started. Patient participated in warm up stretches followed by light weights and resistance bands. Patient was able to complete 6 minute walk test. Patient c/o bilateral knee pain 5/10 during walk test. Pain subsided to 0/10 at 2 minute rest. Pain was completely gone at the end of orientation. Patient was measured for the equipment. Discussed equipment safety with patient. Took patient pre-anthropometric measurements. Patient finished visit at 10:30.

## 2019-08-13 NOTE — Progress Notes (Signed)
Daily Session Note  Patient Details  Name: Nichole Reyes MRN: [id number] Date of Birth: 12-Jan-1951 Referring Provider:     PULMONARY REHAB OTHER RESP ORIENTATION from 08/13/2019 in [place]  Referring Provider  Dr. Chase Caller      Encounter Date: 08/13/2019  Check In: Session Check In - 08/13/19 1123      Check-In   Supervising physician immediately available to respond to emergencies  See telemetry face sheet for immediately available MD    Location  [facility]    Staff Present  Russella Dar, MS, EP, [facility], Exercise Physiologist;Debra Wynetta Emery, RN, BSN;Vaniece Hatchett, Exercise Physiologist    Virtual Visit  No    Medication changes reported      No    Fall or balance concerns reported     No    Tobacco Cessation  No Change   Never smoked   Warm-up and Cool-down  Performed as group-led instruction    Resistance Training Performed  Yes    VAD Patient?  No    PAD/SET Patient?  No      Pain Assessment   Currently in Pain?  No/denies    Pain Score  0-No pain    Multiple Pain Sites  No       Capillary Blood Glucose: No results found for this or any previous visit (from the past 24 hour(s)).    Social History   Tobacco Use  Smoking Status Never Smoker  Smokeless Tobacco Never Used    Goals Met:  Proper associated with RPD/PD & O2 Sat Independence with exercise equipment Improved SOB with ADL's Using PLB without cueing & demonstrates good technique Exercise tolerated well Personal goals reviewed No report of cardiac concerns or symptoms Strength training completed today  Goals Unmet:  Not Applicable  Comments: Check out: 1030   Dr. Kathie Dike is Medical Director for [facility] Pulmonary Rehab.

## 2019-08-18 DIAGNOSIS — I10 Essential (primary) hypertension: Secondary | ICD-10-CM | POA: Diagnosis not present

## 2019-08-18 DIAGNOSIS — E785 Hyperlipidemia, unspecified: Secondary | ICD-10-CM | POA: Diagnosis not present

## 2019-08-19 ENCOUNTER — Other Ambulatory Visit: Payer: Self-pay

## 2019-08-19 ENCOUNTER — Encounter (HOSPITAL_COMMUNITY)
Admission: RE | Admit: 2019-08-19 | Discharge: 2019-08-19 | Disposition: A | Discharge status: Home / Self Care | Payer: Medicare Other | Source: Ambulatory Visit | Attending: Internal Medicine | Admitting: Internal Medicine

## 2019-08-19 DIAGNOSIS — M797 Fibromyalgia: Secondary | ICD-10-CM | POA: Insufficient documentation

## 2019-08-19 DIAGNOSIS — Z7901 Long term (current) use of anticoagulants: Secondary | ICD-10-CM | POA: Insufficient documentation

## 2019-08-19 DIAGNOSIS — J849 Interstitial pulmonary disease, unspecified: Secondary | ICD-10-CM | POA: Insufficient documentation

## 2019-08-19 DIAGNOSIS — Z7984 Long term (current) use of oral hypoglycemic drugs: Secondary | ICD-10-CM | POA: Insufficient documentation

## 2019-08-19 DIAGNOSIS — Z7982 Long term (current) use of aspirin: Secondary | ICD-10-CM | POA: Diagnosis not present

## 2019-08-19 DIAGNOSIS — I10 Essential (primary) hypertension: Secondary | ICD-10-CM | POA: Insufficient documentation

## 2019-08-19 DIAGNOSIS — E7800 Pure hypercholesterolemia, unspecified: Secondary | ICD-10-CM | POA: Diagnosis not present

## 2019-08-19 DIAGNOSIS — Z79899 Other long term (current) drug therapy: Secondary | ICD-10-CM | POA: Insufficient documentation

## 2019-08-19 DIAGNOSIS — E119 Type 2 diabetes mellitus without complications: Secondary | ICD-10-CM | POA: Insufficient documentation

## 2019-08-19 DIAGNOSIS — M810 Age-related osteoporosis without current pathological fracture: Secondary | ICD-10-CM | POA: Diagnosis not present

## 2019-08-19 NOTE — Progress Notes (Signed)
Daily Session Note  Patient Details  Name: Nichole Reyes MRN: [id number] Date of Birth: 08-29-1950 Referring Provider:     PULMONARY REHAB OTHER RESP ORIENTATION from 08/13/2019 in [place]  Referring Provider  Dr. Chase Caller      Encounter Date: 08/19/2019  Check In: Session Check In - 08/19/19 1139      Check-In   Supervising physician immediately available to respond to emergencies  See telemetry face sheet for immediately available MD    Location  [facility]    Staff Present  Russella Dar, MS, EP, [facility], Exercise Physiologist;Vaniece Hatchett, Exercise Physiologist    Virtual Visit  No    Medication changes reported      No    Fall or balance concerns reported     No    Tobacco Cessation  No Change    Warm-up and Cool-down  Performed as group-led instruction    Resistance Training Performed  Yes    VAD Patient?  No    PAD/SET Patient?  No      Pain Assessment   Currently in Pain?  No/denies    Pain Score  0-No pain    Multiple Pain Sites  No       Capillary Blood Glucose: No results found for this or any previous visit (from the past 24 hour(s)).    Social History   Tobacco Use  Smoking Status Never Smoker  Smokeless Tobacco Never Used    Goals Met:  Proper associated with RPD/PD & O2 Sat Independence with exercise equipment Improved SOB with ADL's Using PLB without cueing & demonstrates good technique Exercise tolerated well Personal goals reviewed No report of cardiac concerns or symptoms Strength training completed today  Goals Unmet:  Not Applicable  Comments: Check out: 1145   Dr. Kathie Dike is Medical Director for [facility] Pulmonary Rehab.

## 2019-08-21 ENCOUNTER — Other Ambulatory Visit: Payer: Self-pay | Admitting: Cardiovascular Disease

## 2019-08-21 ENCOUNTER — Telehealth: Payer: Self-pay | Admitting: *Deleted

## 2019-08-21 ENCOUNTER — Encounter (HOSPITAL_COMMUNITY): Payer: Medicare Other

## 2019-08-21 MED ORDER — EZETIMIBE 10 MG PO TABS: 10.0000 mg | ORAL_TABLET | Freq: Every day | ORAL | 0 refills | Status: DC

## 2019-08-21 NOTE — Telephone Encounter (Signed)
Patient advised calcium, vitamin D and repeat Dexa in 2 years.

## 2019-08-21 NOTE — Telephone Encounter (Signed)
*  STAT* If patient is at the pharmacy, call can be transferred to refill team.   1. Which medications need to be refilled?  ezetimibe (ZETIA) 10 MG tablet   2. Which pharmacy/location (including street and city if local pharmacy) is medication to be sent to? [facility]  3. Do they need a 30 day or 90 day supply?

## 2019-08-21 NOTE — Telephone Encounter (Signed)
Received DEXA results from [facility].  Date of Scan: 08/12/2019 Lowest T-score and site measured: -2.1 Lumbar Spine  Significant changes in BMD and site measured (5% and above):-7.7 % lumbar spine  Current Regimen: patient taking calcium  Recommendation: calcium, vitamin D and repeat Dexa in 2 years  Attempted to contact the patient and left message for patient to call the office.

## 2019-08-22 ENCOUNTER — Other Ambulatory Visit (HOSPITAL_COMMUNITY)
Admission: RE | Admit: 2019-08-22 | Discharge: 2019-08-22 | Disposition: A | Discharge status: Home / Self Care | Payer: Medicare Other | Source: Ambulatory Visit | Attending: Internal Medicine | Admitting: Internal Medicine

## 2019-08-22 ENCOUNTER — Other Ambulatory Visit: Payer: Self-pay

## 2019-08-22 DIAGNOSIS — Z20822 Contact with and (suspected) exposure to covid-19: Secondary | ICD-10-CM | POA: Insufficient documentation

## 2019-08-22 DIAGNOSIS — Z01812 Encounter for preprocedural laboratory examination: Secondary | ICD-10-CM | POA: Insufficient documentation

## 2019-08-22 NOTE — Progress Notes (Signed)
Office Visit Note  Patient: Nichole Reyes             Date of Birth: 01/02/1951           MRN: [id number]             PCP: Rory Percy, MD Referring: Rory Percy, MD Visit Date: 09/05/2019 Occupation: @GUAROCC @  Subjective:  Knee Pain (Right knee pain, popped x 1 day)   History of Present Illness: Nichole Reyes is a 69 y.o. female with history of fibromyalgia, osteoarthritis and degenerative disc disease.  She states yesterday she was walking in her hallway and going to her bedroom and suddenly she felt a pop in her right knee.  Her right knee joint has been painful and swollen since then.  She is having difficulty walking.  None of the other joints are painful.  She states her neck and lower back pain is tolerable.  The fibromyalgia symptoms are also manageable.  None of the other joints are painful or swollen.  Activities of Daily Living:  Patient reports morning stiffness for 0 none.   Patient Denies nocturnal pain.  Difficulty dressing/grooming: Denies Difficulty climbing stairs: Denies Difficulty getting out of chair: Denies Difficulty using hands for taps, buttons, cutlery, and/or writing: Denies  Review of Systems  Constitutional: Positive for fatigue. Negative for night sweats, weight gain and weight loss.  HENT: Negative for mouth sores, trouble swallowing, trouble swallowing, mouth dryness and nose dryness.   Eyes: Negative for pain, redness, visual disturbance and dryness.  Respiratory: Negative for cough, shortness of breath and difficulty breathing.   Cardiovascular: Negative for chest pain, palpitations, hypertension, irregular heartbeat and swelling in legs/feet.  Gastrointestinal: Negative for blood in stool, constipation and diarrhea.  Endocrine: Negative for increased urination.  Genitourinary: Negative for difficulty urinating and vaginal dryness.  Musculoskeletal: Positive for arthralgias, joint pain and muscle tenderness. Negative for joint  swelling, myalgias, muscle weakness, morning stiffness and myalgias.  Skin: Negative for color change, rash, hair loss, skin tightness, ulcers and sensitivity to sunlight.  Allergic/Immunologic: Negative for susceptible to infections.  Neurological: Negative for dizziness, numbness, memory loss, night sweats and weakness.  Hematological: Negative for bruising/bleeding tendency and swollen glands.  Psychiatric/Behavioral: Negative for depressed mood and sleep disturbance. The patient is not nervous/anxious.     PMFS History:  Patient Active Problem List   Diagnosis Date Noted  . ILD (interstitial lung disease) ([place]) 08/26/2019  . Fibromyalgia syndrome 06/26/2016  . Other fatigue 06/26/2016  . Other insomnia 06/26/2016  . Arthralgia of both knees 06/26/2016  . Pain in both feet 06/26/2016  . Primary osteoarthritis of both feet 06/26/2016  . Primary osteoarthritis of both knees 06/26/2016  . DDD (degenerative disc disease), cervical 06/26/2016  . 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 DDD (degenerative disc disease), lumbar 06/26/2016  . History of hypertension 06/26/2016  . History of high cholesterol 06/26/2016  . Other sleep apnea 06/26/2016  . History of osteopenia 06/26/2016  . 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 THYROID NODULE, LEFT 01/03/2008  . HYPERLIPIDEMIA 01/03/2008  . DEPRESSION 01/03/2008  . MIGRAINE HEADACHE 01/03/2008  . ALLERGIC RHINITIS 01/03/2008  . ALOPECIA 01/03/2008  . ALLERGIC ARTHRITIS SITE UNSPECIFIED 01/03/2008    Past Medical History:  Diagnosis Date  . Diabetes mellitus without complication ([place])   . Fibromyalgia   . High cholesterol   . Hypertension   . Idiopathic pulmonary fibrosis ([place])   . Osteoporosis     Family  History  Problem Relation Age of Onset  . Hypertension Mother   . Diabetes Mother   . Osteoporosis Mother   . Hypertension Father   . Heart disease Father   . COPD Father   . Colon cancer Sister     . Cancer Brother   . Heart disease Brother   . Emphysema Paternal Grandfather    Past Surgical History:  Procedure Laterality Date  . ABDOMINAL HYSTERECTOMY  2007  . CATARACT EXTRACTION Bilateral    Social History   Social History Narrative  . Not on file   Immunization History  Administered Date(s) Administered  . Fluad Quad(high Dose 65+) 01/29/2019  . Influenza Split 12/18/2017  . Influenza-Unspecified 11/18/2013  . PFIZER SARS-COV-2 Vaccination 05/15/2019, 06/13/2019  . Tdap 05/04/1999     Objective: Vital Signs: BP 115/65 (BP Location: Left Arm, Patient Position: Sitting, Cuff Size: Normal)   Pulse 90   Resp 18   Ht 5\' 5"  (1.651 m)   Wt 174 lb 12.8 oz (79.3 kg)   BMI 29.09 kg/m    Physical Exam Vitals and nursing note reviewed.  Constitutional:      Appearance: She is well-developed.  HENT:     Head: Normocephalic and atraumatic.  Eyes:     Conjunctiva/sclera: Conjunctivae normal.  Cardiovascular:     Rate and Rhythm: Normal rate and regular rhythm.     Heart sounds: Normal heart sounds.  Pulmonary:     Effort: Pulmonary effort is normal.     Breath sounds: Normal breath sounds.  Abdominal:     General: Bowel sounds are normal.     Palpations: Abdomen is soft.  Musculoskeletal:     Cervical back: Normal range of motion.  Lymphadenopathy:     Cervical: No cervical adenopathy.  Skin:    General: Skin is warm and dry.     Capillary Refill: Capillary refill takes less than 2 seconds.  Neurological:     Mental Status: She is alert and oriented to person, place, and time.  Psychiatric:        Behavior: Behavior normal.      Musculoskeletal Exam: She has limited range of motion of her cervical thoracic and lumbar spine due to degenerative disc disease.  Shoulder joints, elbow joints, wrist joints, MCPs PIPs and DIPs with good range of motion with no synovitis.  She has warmth and swelling in her right knee joint.  Small effusion was noted.  Left knee  joint was in full range of motion.  CDAI Exam: CDAI Score: -- Patient Global: --; Provider Global: -- Swollen: --; Tender: -- Joint Exam 09/05/2019   No joint exam has been documented for this visit   There is currently no information documented on the homunculus. Go to the Rheumatology activity and complete the homunculus joint exam.  Investigation: No additional findings.  Imaging: XR KNEE 3 VIEW RIGHT  Result Date: 09/05/2019 Mild medial compartment narrowing was noted.  No patellofemoral narrowing was noted.  No chondrocalcinosis was noted. Impression: These findings are consistent mild osteoarthritis.   Recent Labs: Lab Results  Component Value Date   WBC 10.5 03/15/2017   HGB 14.6 03/15/2017   PLT 439 (H) 03/15/2017   NA 140 03/15/2017   K 4.8 03/15/2017   CL 103 03/15/2017   CO2 31 03/15/2017   GLUCOSE 104 (H) 03/15/2017   BUN 12 03/15/2017   CREATININE 0.73 03/15/2017   BILITOT 0.3 08/26/2019   ALKPHOS 71 08/26/2019   AST 20 08/26/2019  ALT 18 08/26/2019   PROT 7.5 08/26/2019   ALBUMIN 4.4 08/26/2019   CALCIUM 10.0 03/15/2017   GFRAA 101 03/15/2017    Speciality Comments: No specialty comments available.  Procedures:  Large Joint Inj: R knee on 09/05/2019 12:20 PM Indications: pain Details: 27 G 1.5 in needle, medial approach  Arthrogram: No  Medications: 40 mg triamcinolone acetonide 40 MG/ML; 1.5 mL lidocaine 1 % Aspirate: 0 mL Outcome: tolerated well, no immediate complications Procedure, treatment alternatives, risks and benefits explained, specific risks discussed. Consent was given by the patient. Immediately prior to procedure a time out was called to verify the correct patient, procedure, equipment, support staff and site/side marked as required. Patient was prepped and draped in the usual sterile fashion.     Allergies: Latex, Pseudoephedrine, Sulfa antibiotics, and Sulfonamide derivatives   Assessment / Plan:     Visit Diagnoses: Acute  pain of right knee-patient's felt sudden pop in her right knee joint yesterday.  She is having difficulty walking.  Her right knee joint is warm and swollen.  X-ray obtained today showed only mild osteoarthritic changes.  There is a possibility of internal derangement.  I discussed a trial of cortisone injection.  She was in agreement.  Per her request after informed consent was obtained right knee joint was prepped in sterile fashion and injected with cortisone.  She tolerated the procedure well.  We will see response to it.  If she has persistent symptoms she supposed to notify me.  I will consider obtaining MRI to look for internal derangement.  Use of topical Voltaren gel was also discussed.  Primary osteoarthritis of both knees - Plan: XR KNEE 3 VIEW RIGHT.  X-ray showed mild osteoarthritic changes.  Chronic left shoulder pain - XRs unremarkable on 08/22/18.  She had a left glenohumeral joint cortisone injection on 08/22/18, which provided temporary relief.  She has some discomfort.  Primary osteoarthritis of both feet-chronic pain.  DDD (degenerative disc disease), cervical-her pain is manageable.  DDD (degenerative disc disease), thoracic-she is not having much discomfort currently.  DDD (degenerative disc disease), lumbar-chronic pain which is manageable.  Fibromyalgia -she continues to have generalized pain.  She is on tizanidine   Other fatigue -improved.  Other insomnia-good sleep hygiene discussed.  History of migraine  History of hypercholesterolemia  History of hypertension  History of depression  History of hyperlipidemia  History of sleep apnea  Orders: Orders Placed This Encounter  Procedures  . Large Joint Inj  . XR KNEE 3 VIEW RIGHT   No orders of the defined types were placed in this encounter.     Follow-Up Instructions: Return in about 4 weeks (around 10/03/2019) for Osteoarthritis,FMS.   Bo Merino, MD  Note - This record has been created using  Editor, commissioning.  Chart creation errors have been sought, but may not always  have been located. Such creation errors do not reflect on  the standard of medical care.

## 2019-08-23 LAB — SARS CORONAVIRUS 2 (TAT 6-24 HRS): SARS Coronavirus 2: NEGATIVE

## 2019-08-25 ENCOUNTER — Other Ambulatory Visit: Payer: Self-pay | Admitting: Internal Medicine

## 2019-08-25 DIAGNOSIS — J84112 Idiopathic pulmonary fibrosis: Secondary | ICD-10-CM

## 2019-08-26 ENCOUNTER — Encounter (HOSPITAL_COMMUNITY): Payer: Medicare Other

## 2019-08-26 ENCOUNTER — Ambulatory Visit: Payer: Medicare Other | Admitting: Primary Care

## 2019-08-26 ENCOUNTER — Ambulatory Visit: Payer: Medicare Other | Admitting: Adult Health

## 2019-08-26 ENCOUNTER — Encounter: Payer: Self-pay | Admitting: Adult Health

## 2019-08-26 ENCOUNTER — Other Ambulatory Visit: Payer: Self-pay

## 2019-08-26 ENCOUNTER — Ambulatory Visit (INDEPENDENT_AMBULATORY_CARE_PROVIDER_SITE_OTHER): Payer: Medicare Other | Admitting: Internal Medicine

## 2019-08-26 VITALS — BP 120/80 | HR 84 | Temp 97.3°F | Ht 65.0 in | Wt 172.0 lb

## 2019-08-26 DIAGNOSIS — J84112 Idiopathic pulmonary fibrosis: Secondary | ICD-10-CM

## 2019-08-26 DIAGNOSIS — J849 Interstitial pulmonary disease, unspecified: Secondary | ICD-10-CM

## 2019-08-26 LAB — PULMONARY FUNCTION TEST
DL/VA % pred: 113 %
DL/VA: 4.68 ml/min/mmHg/L
DLCO cor % pred: 76 %
DLCO cor: 15.62 ml/min/mmHg
DLCO unc % pred: 76 %
DLCO unc: 15.62 ml/min/mmHg
FEF 25-75 Pre: 3.41 L/sec
FEF2575-%Pred-Pre: 159 %
FEV1-%Pred-Pre: 83 %
FEV1-Pre: 2.08 L
FEV1FVC-%Pred-Pre: 114 %
FEV6-%Pred-Pre: 75 %
FEV6-Pre: 2.36 L
FEV6FVC-%Pred-Pre: 104 %
FVC-%Pred-Pre: 72 %
FVC-Pre: 2.36 L
Pre FEV1/FVC ratio: 88 %
Pre FEV6/FVC Ratio: 100 %

## 2019-08-26 NOTE — Patient Instructions (Addendum)
Continue on Flovent 1 puff Twice daily   Continue on Esbriet Three times a day  .  Sunscreen when outside.  Albuterol As needed   Activity as tolerated.  Labs today  Continue on pulmonary rehab .  Discuss with primary provider that lisinopril may be aggravating your cough .  Follow up with Dr. Chase Caller in 3 months and As needed

## 2019-08-26 NOTE — Progress Notes (Signed)
_0  ID: Nichole Reyes, female    DOB: 10/08/1950, 69 y.o.   MRN: [id number]  Chief Complaint  Patient presents with  . Follow-up    IPF     Referring provider: Rory Percy, MD  HPI: 69 year old female never smoker followed for interstitial lung disease-clinical diagnosis of IPF  TEST/EVENTS :  Autoimmune and connective tissue lab work was negative  ILD work-up-no known family history of fibrotic lung disease, never smoker, no birds or chickens.  No down pillows.  No known mold exposure.  No hot tub.,  No known occupational exposure.  No known at risk medication exposure.  +GERD  Rheumatology work-up with Dr. Claudie Revering positive for fibromyalgia and osteoarthritis  Pulmonary function testing February 28, 2019 showed FEV1 83%, ratio 90, FVC 70%, no significant bronchodilator response, DLCO 77%  CT chest report July 2020 showed subpleural predominant fibrosis no honeycombing and most prominent in the mid lung zones per report probable UIP pattern with subpleural fibrosis and gradient from apical to basal lung zones, bronchiectasis millimeter nodule in the right middle lobe, posterior right upper lobe nodule 1.4 cm.  Nodules stable since 2008 .  Esbriet started 06/11/19   08/26/2019 Follow up : ILD  Patient presents for a 6-week follow-up.  Patient is followed for underlying interstitial lung disease.  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  She was started on Esbriet 3 capsules 3 times a day in March.  Patient says she is tolerating fairly well.  She does have some decreased appetite. She says she has a daily cough.  She says it is mostly dry tickling sensation in her throat but occasionally has severe coughing paroxysms.  Patient is on ACE inhibitor.  She remains on Flovent 1 puff twice daily.  Denies any wheezing.  She says her shortness of breath is about the same.  She has started pulmonary rehab this past week. Patient had pulmonary function testing today that appeared stable.  FEV1 was 83%,  ratio 88, FVC 72%, DLCO 76%.  This is similar to December 2020.   Allergies  Allergen Reactions  . Latex   . Pseudoephedrine Other (See Comments)    Rash and hallucinations   . Sulfa Antibiotics   . Sulfonamide Derivatives Rash    Immunization History  Administered Date(s) Administered  . Fluad Quad(high Dose 65+) 01/29/2019  . Influenza Split 12/18/2017  . Influenza-Unspecified 11/18/2013  . PFIZER SARS-COV-2 Vaccination 05/15/2019, 06/13/2019  . Tdap 05/04/1999    Past Medical History:  Diagnosis Date  . Diabetes mellitus without complication ([place])   . Fibromyalgia   . High cholesterol   . Hypertension   . Osteoporosis     Tobacco History: Social History   Tobacco Use  Smoking Status Never Smoker  Smokeless Tobacco Never Used   Counseling given: Not Answered   Outpatient Medications Prior to Visit  Medication Sig Dispense Refill  . ACCU-CHEK AVIVA PLUS test strip     . Accu-Chek Softclix Lancets lancets     . albuterol (VENTOLIN HFA) 108 (90 Base) MCG/ACT inhaler 2 puffs every 4 (four) hours as needed.     Marland Kitchen aspirin EC 81 MG tablet Take 81 mg by mouth daily.    . Blood Glucose Monitoring Suppl (ACCU-CHEK AVIVA PLUS) w/Device KIT     . Calcium Carbonate (CALTRATE 600 PO) Take 1 tablet by mouth 2 (two) times a day.    . clonazePAM (KLONOPIN) 0.5 MG tablet Take 0.5 mg by mouth daily.     Marland Kitchen ezetimibe (  ZETIA) 10 MG tablet Take 1 tablet (10 mg total) by mouth daily. 30 tablet 0  . fenofibrate (TRICOR) 48 MG tablet Take 48 mg by mouth daily.     . fluticasone (FLOVENT HFA) 110 MCG/ACT inhaler Inhale 1 puff into the lungs 2 (two) times daily. 1 Inhaler 12  . lisinopril (ZESTRIL) 20 MG tablet Take 1 tablet by mouth daily.    . metFORMIN (GLUCOPHAGE-XR) 500 MG 24 hr tablet Take 500 mg by mouth daily with breakfast.     . metoprolol succinate (TOPROL-XL) 50 MG 24 hr tablet Take 50 mg by mouth daily.     . naproxen (NAPROSYN) 500 MG tablet naproxen 500 mg tablet    .  omeprazole (PRILOSEC) 20 MG capsule Take 1 capsule (20 mg total) by mouth daily. 30 capsule 11  . Pirfenidone (ESBRIET) 267 MG TABS Take 3 tablets by mouth 3 (three) times daily. 801 mg three times daily    . SAVELLA 50 MG TABS tablet Take 50 mg by mouth 2 (two) times daily.     Marland Kitchen tiZANidine (ZANAFLEX) 4 MG tablet TAKE 1 TABLET BY MOUTH AT BEDTIME. 30 tablet 0  . triamcinolone cream (KENALOG) 0.1 % SMARTSIG:1 Application Topical 2-3 Times Daily     No facility-administered medications prior to visit.     Review of Systems:   Constitutional:   No  weight loss, night sweats,  Fevers, chills,  +fatigue, or  lassitude.  HEENT:   No headaches,  Difficulty swallowing,  Tooth/dental problems, or  Sore throat,                No sneezing, itching, ear ache, nasal congestion, post nasal drip,   CV:  No chest pain,  Orthopnea, PND, swelling in lower extremities, anasarca, dizziness, palpitations, syncope.   GI  No heartburn, indigestion, abdominal pain, nausea, vomiting, diarrhea, change in bowel habits, loss of appetite, bloody stools.   Resp:    No chest wall deformity  Skin: rash on arms   GU: no dysuria, change in color of urine, no urgency or frequency.  No flank pain, no hematuria   MS:  No joint pain or swelling.  No decreased range of motion.  No back pain.    Physical Exam  BP 120/80 (BP Location: Left Arm, Cuff Size: Normal)   Pulse 84   Temp (!) 97.3 F (36.3 C) (Oral)   Ht _0  (1.651 m)   Wt 172 lb (78 kg)   SpO2 98%   BMI 28.62 kg/m   GEN: A/Ox3; pleasant , NAD    HEENT:  [place]/AT,  NOSE-clear, THROAT-clear, no lesions, no postnasal drip or exudate noted.   NECK:  Supple w/ fair ROM; no JVD; normal carotid impulses w/o bruits; no thyromegaly or nodules palpated; no lymphadenopathy.    RESP  Clear  P & A; w/o, wheezes/ rales/ or rhonchi. no accessory muscle use, no dullness to percussion  CARD:  RRR, no m/r/g, no peripheral edema, pulses intact, no cyanosis or  clubbing.  GI:   Soft & nt; nml bowel sounds; no organomegaly or masses detected.   Musco: Warm bil, no deformities or joint swelling noted.   Neuro: alert, no focal deficits noted.    Skin: Warm, no lesions or rashes    Lab Results:    BNP No results found for: BNP  ProBNP No results found for: PROBNP  Imaging: No results found.    PFT Results Latest Ref Rng & Units 08/26/2019 11/19/2018  FVC-Pre L 2.36 2.29  FVC-Predicted Pre % 72 70  FVC-Post L - 2.30  FVC-Predicted Post % - 70  Pre FEV1/FVC % % 88 89  Post FEV1/FCV % % - 90  FEV1-Pre L 2.08 2.04  FEV1-Predicted Pre % 83 82  FEV1-Post L - 2.07  DLCO UNC% % 76 77  DLCO COR %Predicted % 113 118  TLC L - 3.44  TLC % Predicted % - 66  RV % Predicted % - 50    No results found for: NITRICOXIDE      Assessment & Plan:   ILD (interstitial lung disease) ([facility]) ILD/IPF w/ UIP pattern  Autoimmune workup is negative .  Seems to be tolerating esbriet  PFT are stable  Hold on HRCT chest for now .  Cont w/ pulm rehab  Advised to use sunscreen .  Check LFT   Plan  Patient Instructions  Continue on Flovent 1 puff Twice daily   Continue on Esbriet Three times a day  .  Sunscreen when outside.  Albuterol As needed   Activity as tolerated.  Labs today  Continue on pulmonary rehab .  Discuss with primary provider that lisinopril may be aggravating your cough .  Follow up with Dr. Chase Caller in 3 months and As needed         Total patient care time 43 min    Terese Heier, NP 08/26/2019

## 2019-08-26 NOTE — Assessment & Plan Note (Signed)
ILD/IPF w/ UIP pattern  Autoimmune workup is negative .  Seems to be tolerating esbriet  PFT are stable  Hold on HRCT chest for now .  Cont w/ pulm rehab  Advised to use sunscreen .  Check LFT   Plan  Patient Instructions  Continue on Flovent 1 puff Twice daily   Continue on Esbriet Three times a day  .  Sunscreen when outside.  Albuterol As needed   Activity as tolerated.  Labs today  Continue on pulmonary rehab .  Discuss with primary provider that lisinopril may be aggravating your cough .  Follow up with Dr. Chase Caller in 3 months and As needed

## 2019-08-26 NOTE — Progress Notes (Signed)
Pulmonary Individual Treatment Plan  Patient Details  Name: Nichole Reyes MRN: [id number] Date of Birth: 24-Feb-1951 Referring Provider:     PULMONARY REHAB OTHER RESP ORIENTATION from 08/13/2019 in [place]  Referring Provider  Dr. Chase Caller      Initial Encounter Date:    [facility] from 08/13/2019 in [place]  Date  08/13/19      Visit Diagnosis: ILD (interstitial lung disease) ([place])  Patient's Home Medications on Admission:   Current Outpatient Medications:    ACCU-CHEK AVIVA PLUS test strip, , Disp: , Rfl:    Accu-Chek Softclix Lancets lancets, , Disp: , Rfl:    albuterol (VENTOLIN HFA) 108 (90 Base) MCG/ACT inhaler, 2 puffs every 4 (four) hours as needed. , Disp: , Rfl:    aspirin EC 81 MG tablet, Take 81 mg by mouth daily., Disp: , Rfl:    Blood Glucose Monitoring Suppl (ACCU-CHEK AVIVA PLUS) w/Device KIT, , Disp: , Rfl:    Calcium Carbonate (CALTRATE 600 PO), Take 1 tablet by mouth 2 (two) times a day., Disp: , Rfl:    clonazePAM (KLONOPIN) 0.5 MG tablet, Take 0.5 mg by mouth daily. , Disp: , Rfl:    ezetimibe (ZETIA) 10 MG tablet, Take 1 tablet (10 mg total) by mouth daily., Disp: 30 tablet, Rfl: 0   fenofibrate (TRICOR) 48 MG tablet, Take 48 mg by mouth daily. , Disp: , Rfl:    fluticasone (FLOVENT HFA) 110 MCG/ACT inhaler, Inhale 1 puff into the lungs 2 (two) times daily., Disp: 1 Inhaler, Rfl: 12   lisinopril (ZESTRIL) 20 MG tablet, Take 1 tablet by mouth daily., Disp: , Rfl:    metFORMIN (GLUCOPHAGE-XR) 500 MG 24 hr tablet, Take 500 mg by mouth daily with breakfast. , Disp: , Rfl:    metoprolol succinate (TOPROL-XL) 50 MG 24 hr tablet, Take 50 mg by mouth daily. , Disp: , Rfl:    omeprazole (PRILOSEC) 20 MG capsule, Take 1 capsule (20 mg total) by mouth daily., Disp: 30 capsule, Rfl: 11   Pirfenidone (ESBRIET) 267 MG TABS, Take 3 tablets by mouth 3 (three) times daily. 801 mg  three times daily, Disp: , Rfl:    SAVELLA 50 MG TABS tablet, Take 50 mg by mouth 2 (two) times daily. , Disp: , Rfl:    tiZANidine (ZANAFLEX) 4 MG tablet, TAKE 1 TABLET BY MOUTH AT BEDTIME., Disp: 30 tablet, Rfl: 0  Past Medical History: Past Medical History:  Diagnosis Date   Diabetes mellitus without complication (HCC)    Fibromyalgia    High cholesterol    Hypertension    Osteoporosis     Tobacco Use: Social History   Tobacco Use  Smoking Status Never Smoker  Smokeless Tobacco Never Used    Labs: Recent Review Flowsheet Data    There is no flowsheet data to display.      Capillary Blood Glucose: No results found for: GLUCAP   Pulmonary Assessment Scores: Pulmonary Assessment Scores    Row Name 08/13/19 1143         ADL UCSD   ADL Phase  Entry     SOB Score total  78     Walk  10     Stairs  2     Bath  2     Dress  3     Shop  5       CAT Score   CAT Score  23  mMRC Score   mMRC Score  3       UCSD: Self-administered rating of dyspnea associated with activities of daily living (ADLs) 6-point scale (0 = "not at all" to 5 = "maximal or unable to do because of breathlessness")  Scoring Scores range from 0 to 120.  Minimally important difference is 5 units  CAT: CAT can identify the health impairment of COPD patients and is better correlated with disease progression.  CAT has a scoring range of zero to 40. The CAT score is classified into four groups of low (less than 10), medium (10 - 20), high (21-30) and very high (31-40) based on the impact level of disease on health status. A CAT score over 10 suggests significant symptoms.  A worsening CAT score could be explained by an exacerbation, poor medication adherence, poor inhaler technique, or progression of COPD or comorbid conditions.  CAT MCID is 2 points  mMRC: mMRC (Modified Medical Research Council) Dyspnea Scale is used to assess the degree of baseline functional disability in  patients of respiratory disease due to dyspnea. No minimal important difference is established. A decrease in score of 1 point or greater is considered a positive change.   Pulmonary Function Assessment: Pulmonary Function Assessment - 08/13/19 1141      Pulmonary Function Tests   FVC%  70 %    FEV1%  82 %    FEV1/FVC Ratio  116    RV%  50 %    DLCO%  77 %      Initial Spirometry Results   FVC%  70 %    FEV1%  82 %    FEV1/FVC Ratio  116      Post Bronchodilator Spirometry Results   FVC%  70 %    FEV1%  83 %    FEV1/FVC Ratio  117      Breath   Bilateral Breath Sounds  Clear    Shortness of Breath  Yes       Exercise Target Goals: Exercise Program Goal: Individual exercise prescription set using results from initial 6 min walk test and THRR while considering  patients activity barriers and safety.   Exercise Prescription Goal: Initial exercise prescription builds to 30-45 minutes a day of aerobic activity, 2-3 days per week.  Home exercise guidelines will be given to patient during program as part of exercise prescription that the participant will acknowledge.  Activity Barriers & Risk Stratification: Activity Barriers & Cardiac Risk Stratification - 08/13/19 1102      Activity Barriers & Cardiac Risk Stratification   Activity Barriers  Deconditioning;Shortness of Breath;Other (comment)    Comments  knee pain 5/10    Cardiac Risk Stratification  Low       6 Minute Walk: 6 Minute Walk    Row Name 08/13/19 1104         6 Minute Walk   Phase  Initial     Distance  1100 feet     Walk Time  6 minutes     MPH  2.08     METS  2.08     RPE  13     Perceived Dyspnea   13     VO2 Peak  9.04     Symptoms  Yes (comment)     Comments  knee pain     Resting HR  80 bpm     Resting BP  120/70     Resting Oxygen Saturation   97 %  Exercise Oxygen Saturation  during 6 min walk  98 %     Max Ex. HR  101 bpm     Max Ex. BP  120/82     2 Minute Post BP  110/80         Oxygen Initial Assessment: Oxygen Initial Assessment - 08/13/19 1141      Home Oxygen   Home Oxygen Device  None    Sleep Oxygen Prescription  None    Home Exercise Oxygen Prescription  None    Home at Rest Exercise Oxygen Prescription  None    Compliance with Home Oxygen Use  No      Initial 6 min Walk   Oxygen Used  None      Program Oxygen Prescription   Program Oxygen Prescription  None       Oxygen Re-Evaluation:   Oxygen Discharge (Final Oxygen Re-Evaluation):   Initial Exercise Prescription: Initial Exercise Prescription - 08/13/19 1100      Date of Initial Exercise RX and Referring Provider   Date  08/13/19    Referring Provider  Dr. Chase Caller    Expected Discharge Date  11/13/19      Treadmill   MPH  1.1    Grade  0    Minutes  17    METs  1.84      Recumbant Elliptical   Level  1    RPM  45    Watts  55    Minutes  22    METs  3.1      Prescription Details   Frequency (times per week)  2    Duration  Progress to 30 minutes of continuous aerobic without signs/symptoms of physical distress      Intensity   THRR 40-80% of Max Heartrate  [phone]    Ratings of Perceived Exertion  11-13    Perceived Dyspnea  0-4      Progression   Progression  Continue progressive overload as per policy without signs/symptoms or physical distress.      Resistance Training   Training Prescription  Yes    Weight  1    Reps  10-15       Perform Capillary Blood Glucose checks as needed.  Exercise Prescription Changes:   Exercise Comments:  Exercise Comments    Row Name 08/13/19 1029 08/18/19 1524         Exercise Comments  Patient completed walk test. Patient was short of breath and she did complain of knee concerns before starting. During walk test pain was 5/10 and pain subsided after two minute rest.  Patients gait was also comprimised during walking. Patient is looking foward to starting the program.  Nichole Reyes will return on 08/19/19 to become  fully engaged in the her exercise prescription. She will be walking the TM and riding the elliptical. She wll start with 1lb weights. We will progress as tolerated.         Exercise Goals and Review:  Exercise Goals    Row Name 08/13/19 1034             Exercise Goals   Increase Physical Activity  Yes       Intervention  Provide advice, education, support and counseling about physical activity/exercise needs.;Develop an individualized exercise prescription for aerobic and resistive training based on initial evaluation findings, risk stratification, comorbidities and participant's personal goals.       Expected Outcomes  Long Term: Add in home exercise to make  exercise part of routine and to increase amount of physical activity.;Short Term: Attend rehab on a regular basis to increase amount of physical activity.       Increase Strength and Stamina  Yes       Intervention  Provide advice, education, support and counseling about physical activity/exercise needs.;Develop an individualized exercise prescription for aerobic and resistive training based on initial evaluation findings, risk stratification, comorbidities and participant's personal goals.       Expected Outcomes  Short Term: Perform resistance training exercises routinely during rehab and add in resistance training at home;Long Term: Improve cardiorespiratory fitness, muscular endurance and strength as measured by increased METs and functional capacity (6MWT)       Able to understand and use rate of perceived exertion (RPE) scale  Yes       Intervention  Provide education and explanation on how to use RPE scale       Expected Outcomes  Short Term: Able to use RPE daily in rehab to express subjective intensity level;Long Term:  Able to use RPE to guide intensity level when exercising independently       Able to understand and use Dyspnea scale  Yes       Intervention  Provide education and explanation on how to use Dyspnea scale        Expected Outcomes  Short Term: Able to use Dyspnea scale daily in rehab to express subjective sense of shortness of breath during exertion;Long Term: Able to use Dyspnea scale to guide intensity level when exercising independently       Knowledge and understanding of Target Heart Rate Range (THRR)  Yes       Intervention  Provide education and explanation of THRR including how the numbers were predicted and where they are located for reference       Expected Outcomes  Long Term: Able to use THRR to govern intensity when exercising independently;Short Term: Able to state/look up THRR       Able to check pulse independently  Yes       Intervention  Provide education and demonstration on how to check pulse in carotid and radial arteries.;Review the importance of being able to check your own pulse for safety during independent exercise       Expected Outcomes  Short Term: Able to explain why pulse checking is important during independent exercise;Long Term: Able to check pulse independently and accurately       Understanding of Exercise Prescription  Yes       Intervention  Provide education, explanation, and written materials on patient's individual exercise prescription       Expected Outcomes  Short Term: Able to explain program exercise prescription;Long Term: Able to explain home exercise prescription to exercise independently          Exercise Goals Re-Evaluation : Exercise Goals Re-Evaluation    Row Name 08/18/19 1522             Exercise Goal Re-Evaluation   Exercise Goals Review  Increase Physical Activity;Increase Strength and Stamina       Comments  Nichole Reyes wants to be able to do the things she use to do such as go to the gym, work her flower beds and walk the trails. She has finished her orientation visit and will return on 08/19/19 to exercise. We will monitor her progress.       Expected Outcomes  To reach her expected goals.  Discharge Exercise Prescription (Final  Exercise Prescription Changes):   Nutrition:  Target Goals: Understanding of nutrition guidelines, daily intake of sodium <1544m, cholesterol <2050m calories 30% from fat and 7% or less from saturated fats, daily to have 5 or more servings of fruits and vegetables.  Biometrics: Pre Biometrics - 08/13/19 1026      Pre Biometrics   Height  '5\' 5"'  (1.651 m)    Weight  78.8 kg    Waist Circumference  41 inches    Hip Circumference  41 inches    Waist to Hip Ratio  1 %    BMI (Calculated)  28.92    Triceps Skinfold  21 mm    % Body Fat  40 %    Grip Strength  24.23 kg    Flexibility  14.33 in    Single Leg Stand  4.27 seconds        Nutrition Therapy Plan and Nutrition Goals: Nutrition Therapy & Goals - 08/26/19 0937      Personal Nutrition Goals   Comments  We continue to work with our RD to schedule RD classes. In the interim, we are providing education through handouts. She scored a 56 on her medficts diet assessment. She admits she needs to work on eating healthier.      Intervention Plan   Intervention  Nutrition handout(s) given to patient.       Nutrition Assessments: Nutrition Assessments - 08/13/19 1149      MEDFICTS Scores   Pre Score  56       Nutrition Goals Re-Evaluation:   Nutrition Goals Discharge (Final Nutrition Goals Re-Evaluation):   Psychosocial: Target Goals: Acknowledge presence or absence of significant depression and/or stress, maximize coping skills, provide positive support system. Participant is able to verbalize types and ability to use techniques and skills needed for reducing stress and depression.  Initial Review & Psychosocial Screening: Initial Psych Review & Screening - 08/13/19 1144      Initial Review   Current issues with  Current Depression   Mostly due to new diagnosis and being sad when thinkin of her mother.     Family Dynamics   Good Support System?  Yes      Barriers   Psychosocial barriers to participate in  program  There are no identifiable barriers or psychosocial needs.;The patient should benefit from training in stress management and relaxation.      Screening Interventions   Interventions  Encouraged to exercise    Expected Outcomes  Short Term goal: Identification and review with participant of any Quality of Life or Depression concerns found by scoring the questionnaire.;Long Term goal: The participant improves quality of Life and PHQ9 Scores as seen by post scores and/or verbalization of changes       Quality of Life Scores: Quality of Life - 08/13/19 1024      Quality of Life   Select  Quality of Life      Quality of Life Scores   Health/Function Pre  13.5 %    Socioeconomic Pre  29.14 %    Psych/Spiritual Pre  24.43 %    Family Pre  30 %    GLOBAL Pre  21.17 %      Scores of 19 and below usually indicate a poorer quality of life in these areas.  A difference of  2-3 points is a clinically meaningful difference.  A difference of 2-3 points in the total score of the Quality of Life Index has  been associated with significant improvement in overall quality of life, self-image, physical symptoms, and general health in studies assessing change in quality of life.   PHQ-9: Recent Review Flowsheet Data    Depression screen [facility] 2/9 08/13/2019   Decreased Interest 0   Down, Depressed, Hopeless 2   PHQ - 2 Score 2   Altered sleeping 2   Tired, decreased energy 3   Change in appetite 1   Feeling bad or failure about yourself  0   Trouble concentrating 0   Moving slowly or fidgety/restless 0   Suicidal thoughts 0   PHQ-9 Score 8   Difficult doing work/chores Somewhat difficult     Interpretation of Total Score  Total Score Depression Severity:  1-4 = Minimal depression, 5-9 = Mild depression, 10-14 = Moderate depression, 15-19 = Moderately severe depression, 20-27 = Severe depression   Psychosocial Evaluation and Intervention: Psychosocial Evaluation - 08/13/19 1145       Psychosocial Evaluation & Interventions   Interventions  Encouraged to exercise with the program and follow exercise prescription    Comments  Patient is really feeling she needs to be able to get back to exercise and her regular ADL's and she will feel better.    Expected Outcomes  To be able to get back to the gym, working her flower beds and walking the trails.    Continue Psychosocial Services   No Follow up required       Psychosocial Re-Evaluation: Psychosocial Re-Evaluation    Nichole Reyes Name 08/26/19 [phone]             Psychosocial Re-Evaluation   Current issues with  Current Depression       Comments  Patient's initial QOL score was 21.78 and her PHQ-9 score was 8. She is currently being treated for depression with Savella 50 mg BID and Clonazepam 0.5 mg daily. She attributes her depression on her ILD diagnosis and feels it is managed with the medication. Will continue to monitor.       Expected Outcomes  Patient will have no additional psychosocial issues identified at discharge and her depression will continue to be managed on medication.       Interventions  Encouraged to attend Pulmonary Rehabilitation for the exercise;Relaxation education;Stress management education       Continue Psychosocial Services   Follow up required by staff          Psychosocial Discharge (Final Psychosocial Re-Evaluation): Psychosocial Re-Evaluation - 08/26/19 0939      Psychosocial Re-Evaluation   Current issues with  Current Depression    Comments  Patient's initial QOL score was 21.78 and her PHQ-9 score was 8. She is currently being treated for depression with Savella 50 mg BID and Clonazepam 0.5 mg daily. She attributes her depression on her ILD diagnosis and feels it is managed with the medication. Will continue to monitor.    Expected Outcomes  Patient will have no additional psychosocial issues identified at discharge and her depression will continue to be managed on medication.    Interventions   Encouraged to attend Pulmonary Rehabilitation for the exercise;Relaxation education;Stress management education    Continue Psychosocial Services   Follow up required by staff        Education: Education Goals: Education classes will be provided on a weekly basis, covering required topics. Participant will state understanding/return demonstration of topics presented.  Learning Barriers/Preferences: Learning Barriers/Preferences - 08/13/19 1149      Learning Barriers/Preferences   Learning Barriers  None    Learning Preferences  Computer/Internet;Group Instruction;Individual Instruction;Pictoral;Written Material;Verbal Instruction;Skilled Demonstration;Video       Education Topics: How Lungs Work and Diseases: - Discuss the anatomy of the lungs and diseases that can affect the lungs, such as COPD.   Exercise: -Discuss the importance of exercise, FITT principles of exercise, normal and abnormal responses to exercise, and how to exercise safely.   Environmental Irritants: -Discuss types of environmental irritants and how to limit exposure to environmental irritants.   Meds/Inhalers and oxygen: - Discuss respiratory medications, definition of an inhaler and oxygen, and the proper way to use an inhaler and oxygen.   Energy Saving Techniques: - Discuss methods to conserve energy and decrease shortness of breath when performing activities of daily living.    Bronchial Hygiene / Breathing Techniques: - Discuss breathing mechanics, pursed-lip breathing technique,  proper posture, effective ways to clear airways, and other functional breathing techniques   Cleaning Equipment: - Provides group verbal and written instruction about the health risks of elevated stress, cause of high stress, and healthy ways to reduce stress.   Nutrition I: Fats: - Discuss the types of cholesterol, what cholesterol does to the body, and how cholesterol levels can be controlled.   Nutrition II:  Labels: -Discuss the different components of food labels and how to read food labels.   Respiratory Infections: - Discuss the signs and symptoms of respiratory infections, ways to prevent respiratory infections, and the importance of seeking medical treatment when having a respiratory infection.   Stress I: Signs and Symptoms: - Discuss the causes of stress, how stress may lead to anxiety and depression, and ways to limit stress.   Stress II: Relaxation: -Discuss relaxation techniques to limit stress.   Oxygen for Home/Travel: - Discuss how to prepare for travel when on oxygen and proper ways to transport and store oxygen to ensure safety.   Knowledge Questionnaire Score: Knowledge Questionnaire Score - 08/13/19 1150      Knowledge Questionnaire Score   Pre Score  14/18       Core Components/Risk Factors/Patient Goals at Admission: Personal Goals and Risk Factors at Admission - 08/13/19 1150      Core Components/Risk Factors/Patient Goals on Admission    Weight Management  Weight Maintenance    Personal Goal Other  Yes    Personal Goal  Patient is hoping she will be able to do the things she use to do like going to the gym, working her flower beds, and walking the trials.    Intervention  Attend PR 2 x week and to supplement with home exercise plan 3 x week.    Expected Outcomes  Reach expected goals.       Core Components/Risk Factors/Patient Goals Review:  Goals and Risk Factor Review    Row Name 08/26/19 [phone]             Core Components/Risk Factors/Patient Goals Review   Personal Goals Review  Weight Management/Obesity Go to gym; work on [employer] garden and walk trails w/o difficulty.       Review  Patient is new to the program completing 2 sessions. She has missed 2 sessions due to knee pain which she attributes to exercising here. I told her we would try her on different equipment when she returns to see if the pain improves. She is in aggreement. Will continue  to monitor for progress.       Expected Outcomes  Patient will continue to attend sessions and complete the  program meeting both program and personal goals.          Core Components/Risk Factors/Patient Goals at Discharge (Final Review):  Goals and Risk Factor Review - 08/26/19 0942      Core Components/Risk Factors/Patient Goals Review   Personal Goals Review  Weight Management/Obesity   Go to gym; work on [employer] garden and walk trails w/o difficulty.   Review  Patient is new to the program completing 2 sessions. She has missed 2 sessions due to knee pain which she attributes to exercising here. I told her we would try her on different equipment when she returns to see if the pain improves. She is in aggreement. Will continue to monitor for progress.    Expected Outcomes  Patient will continue to attend sessions and complete the program meeting both program and personal goals.       ITP Comments: ITP Comments    Row Name 08/13/19 1130           ITP Comments  Patient is eager to get started.          Comments: ITP REVIEW Patient new to the program. She has completed 2 sessions. Will continue to monitor for progress.

## 2019-08-26 NOTE — Progress Notes (Signed)
[facility]/DLCO performed today. 

## 2019-08-27 ENCOUNTER — Other Ambulatory Visit: Payer: Self-pay | Admitting: Rheumatology

## 2019-08-27 LAB — HEPATIC FUNCTION PANEL
ALT: 18 U/L (ref 0–35)
AST: 20 U/L (ref 0–37)
Albumin: 4.4 g/dL (ref 3.5–5.2)
Alkaline Phosphatase: 71 U/L (ref 39–117)
Bilirubin, Direct: 0.1 mg/dL (ref 0.0–0.3)
Total Bilirubin: 0.3 mg/dL (ref 0.2–1.2)
Total Protein: 7.5 g/dL (ref 6.0–8.3)

## 2019-08-27 NOTE — Telephone Encounter (Signed)
Last Visit: 02/20/2019 telemedicine  Next Visit: 09/05/2019  Okay to refill per Dr. Estanislado Pandy

## 2019-08-28 ENCOUNTER — Other Ambulatory Visit: Payer: Self-pay

## 2019-08-28 ENCOUNTER — Encounter (HOSPITAL_COMMUNITY)
Admission: RE | Admit: 2019-08-28 | Discharge: 2019-08-28 | Disposition: A | Discharge status: Home / Self Care | Payer: Medicare Other | Source: Ambulatory Visit | Attending: Internal Medicine | Admitting: Internal Medicine

## 2019-08-28 VITALS — Wt 173.8 lb

## 2019-08-28 DIAGNOSIS — Z79899 Other long term (current) drug therapy: Secondary | ICD-10-CM | POA: Diagnosis not present

## 2019-08-28 DIAGNOSIS — M810 Age-related osteoporosis without current pathological fracture: Secondary | ICD-10-CM | POA: Diagnosis not present

## 2019-08-28 DIAGNOSIS — Z7982 Long term (current) use of aspirin: Secondary | ICD-10-CM | POA: Diagnosis not present

## 2019-08-28 DIAGNOSIS — Z7901 Long term (current) use of anticoagulants: Secondary | ICD-10-CM | POA: Diagnosis not present

## 2019-08-28 DIAGNOSIS — E7800 Pure hypercholesterolemia, unspecified: Secondary | ICD-10-CM | POA: Diagnosis not present

## 2019-08-28 DIAGNOSIS — I10 Essential (primary) hypertension: Secondary | ICD-10-CM | POA: Diagnosis not present

## 2019-08-28 DIAGNOSIS — J849 Interstitial pulmonary disease, unspecified: Secondary | ICD-10-CM

## 2019-08-28 DIAGNOSIS — E119 Type 2 diabetes mellitus without complications: Secondary | ICD-10-CM | POA: Diagnosis not present

## 2019-08-28 DIAGNOSIS — Z7984 Long term (current) use of oral hypoglycemic drugs: Secondary | ICD-10-CM | POA: Diagnosis not present

## 2019-08-28 DIAGNOSIS — M797 Fibromyalgia: Secondary | ICD-10-CM | POA: Diagnosis not present

## 2019-08-28 NOTE — Progress Notes (Signed)
Pulmonary Individual Treatment Plan  Patient Details  Name: Nichole Reyes MRN: [id number] Date of Birth: 05-05-1950 Referring Provider:     PULMONARY REHAB OTHER RESP ORIENTATION from 08/13/2019 in [facility]  Referring Provider Dr. Chase Caller      Initial Encounter Date:    [name] from 08/13/2019 in [facility]  Date 08/13/19      Visit Diagnosis: ILD (interstitial lung disease) ([place])  Patient's Home Medications on Admission:   Current Outpatient Medications:  .  ACCU-CHEK AVIVA PLUS test strip, , Disp: , Rfl:  .  Accu-Chek Softclix Lancets lancets, , Disp: , Rfl:  .  albuterol (VENTOLIN HFA) 108 (90 Base) MCG/ACT inhaler, 2 puffs every 4 (four) hours as needed. , Disp: , Rfl:  .  aspirin EC 81 MG tablet, Take 81 mg by mouth daily., Disp: , Rfl:  .  Blood Glucose Monitoring Suppl (ACCU-CHEK AVIVA PLUS) w/Device KIT, , Disp: , Rfl:  .  Calcium Carbonate (CALTRATE 600 PO), Take 1 tablet by mouth 2 (two) times a day., Disp: , Rfl:  .  clonazePAM (KLONOPIN) 0.5 MG tablet, Take 0.5 mg by mouth daily. , Disp: , Rfl:  .  ezetimibe (ZETIA) 10 MG tablet, Take 1 tablet (10 mg total) by mouth daily., Disp: 30 tablet, Rfl: 0 .  fenofibrate (TRICOR) 48 MG tablet, Take 48 mg by mouth daily. , Disp: , Rfl:  .  fluticasone (FLOVENT HFA) 110 MCG/ACT inhaler, Inhale 1 puff into the lungs 2 (two) times daily., Disp: 1 Inhaler, Rfl: 12 .  lisinopril (ZESTRIL) 20 MG tablet, Take 1 tablet by mouth daily., Disp: , Rfl:  .  metFORMIN (GLUCOPHAGE-XR) 500 MG 24 hr tablet, Take 500 mg by mouth daily with breakfast. , Disp: , Rfl:  .  metoprolol succinate (TOPROL-XL) 50 MG 24 hr tablet, Take 50 mg by mouth daily. , Disp: , Rfl:  .  naproxen (NAPROSYN) 500 MG tablet, naproxen 500 mg tablet, Disp: , Rfl:  .  omeprazole (PRILOSEC) 20 MG capsule, Take 1 capsule (20 mg total) by mouth daily., Disp: 30 capsule, Rfl: 11 .  Pirfenidone  (ESBRIET) 267 MG TABS, Take 3 tablets by mouth 3 (three) times daily. 801 mg three times daily, Disp: , Rfl:  .  SAVELLA 50 MG TABS tablet, Take 50 mg by mouth 2 (two) times daily. , Disp: , Rfl:  .  tiZANidine (ZANAFLEX) 4 MG tablet, TAKE 1 TABLET BY MOUTH AT BEDTIME., Disp: 30 tablet, Rfl: 0 .  triamcinolone cream (KENALOG) 0.1 %, SMARTSIG:1 Application Topical 2-3 Times Daily, Disp: , Rfl:   Past Medical History: Past Medical History:  Diagnosis Date  . Diabetes mellitus without complication ([place])   . Fibromyalgia   . High cholesterol   . Hypertension   . Osteoporosis     Tobacco Use: Social History   Tobacco Use  Smoking Status Never Smoker  Smokeless Tobacco Never Used    Labs: Recent Review Flowsheet Data   There is no flowsheet data to display.     Capillary Blood Glucose: No results found for: GLUCAP   Pulmonary Assessment Scores:  Pulmonary Assessment Scores    Row Name 08/13/19 1143         ADL UCSD   ADL Phase Entry     SOB Score total 78     Walk 10     Stairs 2     Bath 2     Dress 3  Shop 5       CAT Score   CAT Score 23       mMRC Score   mMRC Score 3           UCSD: Self-administered rating of dyspnea associated with activities of daily living (ADLs) 6-point scale (0 = "not at all" to 5 = "maximal or unable to do because of breathlessness")  Scoring Scores range from 0 to 120.  Minimally important difference is 5 units  CAT: CAT can identify the health impairment of COPD patients and is better correlated with disease progression.  CAT has a scoring range of zero to 40. The CAT score is classified into four groups of low (less than 10), medium (10 - 20), high (21-30) and very high (31-40) based on the impact level of disease on health status. A CAT score over 10 suggests significant symptoms.  A worsening CAT score could be explained by an exacerbation, poor medication adherence, poor inhaler technique, or progression of COPD or  comorbid conditions.  CAT MCID is 2 points  mMRC: mMRC (Modified Medical Research Council) Dyspnea Scale is used to assess the degree of baseline functional disability in patients of respiratory disease due to dyspnea. No minimal important difference is established. A decrease in score of 1 point or greater is considered a positive change.   Pulmonary Function Assessment:  Pulmonary Function Assessment - 08/13/19 1141      Pulmonary Function Tests   FVC% 70 %    FEV1% 82 %    FEV1/FVC Ratio 116    RV% 50 %    DLCO% 77 %      Initial Spirometry Results   FVC% 70 %    FEV1% 82 %    FEV1/FVC Ratio 116      Post Bronchodilator Spirometry Results   FVC% 70 %    FEV1% 83 %    FEV1/FVC Ratio 117      Breath   Bilateral Breath Sounds Clear    Shortness of Breath Yes           Exercise Target Goals: Exercise Program Goal: Individual exercise prescription set using results from initial 6 min walk test and THRR while considering  patient's activity barriers and safety.   Exercise Prescription Goal: Initial exercise prescription builds to 30-45 minutes a day of aerobic activity, 2-3 days per week.  Home exercise guidelines will be given to patient during program as part of exercise prescription that the participant will acknowledge.  Activity Barriers & Risk Stratification:  Activity Barriers & Cardiac Risk Stratification - 08/13/19 1102      Activity Barriers & Cardiac Risk Stratification   Activity Barriers Deconditioning;Shortness of Breath;Other (comment)    Comments knee pain 5/10    Cardiac Risk Stratification Low           6 Minute Walk:  6 Minute Walk    Row Name 08/13/19 1104         6 Minute Walk   Phase Initial     Distance 1100 feet     Walk Time 6 minutes     MPH 2.08     METS 2.08     RPE 13     Perceived Dyspnea  13     VO2 Peak 9.04     Symptoms Yes (comment)     Comments knee pain     Resting HR 80 bpm     Resting BP 120/70     Resting  Oxygen Saturation  97 %     Exercise Oxygen Saturation  during 6 min walk 98 %     Max Ex. HR 101 bpm     Max Ex. BP 120/82     2 Minute Post BP 110/80            Oxygen Initial Assessment:  Oxygen Initial Assessment - 08/13/19 1141      Home Oxygen   Home Oxygen Device None    Sleep Oxygen Prescription None    Home Exercise Oxygen Prescription None    Home at Rest Exercise Oxygen Prescription None    Compliance with Home Oxygen Use No      Initial 6 min Walk   Oxygen Used None      Program Oxygen Prescription   Program Oxygen Prescription None           Oxygen Re-Evaluation:   Oxygen Discharge (Final Oxygen Re-Evaluation):   Initial Exercise Prescription:  Initial Exercise Prescription - 08/13/19 1100      Date of Initial Exercise RX and Referring Provider   Date 08/13/19    Referring Provider Dr. Chase Caller    Expected Discharge Date 11/13/19      Treadmill   MPH 1.1    Grade 0    Minutes 17    METs 1.84      Recumbant Elliptical   Level 1    RPM 45    Watts 55    Minutes 22    METs 3.1      Prescription Details   Frequency (times per week) 2    Duration Progress to 30 minutes of continuous aerobic without signs/symptoms of physical distress      Intensity   THRR 40-80% of Max Heartrate [phone]    Ratings of Perceived Exertion 11-13    Perceived Dyspnea 0-4      Progression   Progression Continue progressive overload as per policy without signs/symptoms or physical distress.      Resistance Training   Training Prescription Yes    Weight 1    Reps 10-15           Perform Capillary Blood Glucose checks as needed.  Exercise Prescription Changes:   Exercise Comments:   Exercise Comments    Row Name 08/13/19 1029 08/18/19 1524         Exercise Comments Patient completed walk test. Patient was short of breath and she did complain of knee concerns before starting. During walk test pain was 5/10 and pain subsided after two minute  rest.  Patients gait was also comprimised during walking. Patient is looking foward to starting the program. Daenerys will return on 08/19/19 to become fully engaged in the her exercise prescription. She will be walking the TM and riding the elliptical. She wll start with 1lb weights. We will progress as tolerated.             Exercise Goals and Review:   Exercise Goals    Row Name 08/13/19 1034             Exercise Goals   Increase Physical Activity Yes       Intervention Provide advice, education, support and counseling about physical activity/exercise needs.;Develop an individualized exercise prescription for aerobic and resistive training based on initial evaluation findings, risk stratification, comorbidities and participant's personal goals.       Expected Outcomes Long Term: Add in home exercise to make exercise part of routine and to increase amount of  physical activity.;Short Term: Attend rehab on a regular basis to increase amount of physical activity.       Increase Strength and Stamina Yes       Intervention Provide advice, education, support and counseling about physical activity/exercise needs.;Develop an individualized exercise prescription for aerobic and resistive training based on initial evaluation findings, risk stratification, comorbidities and participant's personal goals.       Expected Outcomes Short Term: Perform resistance training exercises routinely during rehab and add in resistance training at home;Long Term: Improve cardiorespiratory fitness, muscular endurance and strength as measured by increased METs and functional capacity (6MWT)       Able to understand and use rate of perceived exertion (RPE) scale Yes       Intervention Provide education and explanation on how to use RPE scale       Expected Outcomes Short Term: Able to use RPE daily in rehab to express subjective intensity level;Long Term:  Able to use RPE to guide intensity level when exercising  independently       Able to understand and use Dyspnea scale Yes       Intervention Provide education and explanation on how to use Dyspnea scale       Expected Outcomes Short Term: Able to use Dyspnea scale daily in rehab to express subjective sense of shortness of breath during exertion;Long Term: Able to use Dyspnea scale to guide intensity level when exercising independently       Knowledge and understanding of Target Heart Rate Range (THRR) Yes       Intervention Provide education and explanation of THRR including how the numbers were predicted and where they are located for reference       Expected Outcomes Long Term: Able to use THRR to govern intensity when exercising independently;Short Term: Able to state/look up THRR       Able to check pulse independently Yes       Intervention Provide education and demonstration on how to check pulse in carotid and radial arteries.;Review the importance of being able to check your own pulse for safety during independent exercise       Expected Outcomes Short Term: Able to explain why pulse checking is important during independent exercise;Long Term: Able to check pulse independently and accurately       Understanding of Exercise Prescription Yes       Intervention Provide education, explanation, and written materials on patient's individual exercise prescription       Expected Outcomes Short Term: Able to explain program exercise prescription;Long Term: Able to explain home exercise prescription to exercise independently              Exercise Goals Re-Evaluation :  Exercise Goals Re-Evaluation    Row Name 08/18/19 1522             Exercise Goal Re-Evaluation   Exercise Goals Review Increase Physical Activity;Increase Strength and Stamina       Comments Carly wants to be able to do the things she use to do such as go to the gym, work her flower beds and walk the trails. She has finished her orientation visit and will return on 08/19/19 to  exercise. We will monitor her progress.       Expected Outcomes To reach her expected goals.              Discharge Exercise Prescription (Final Exercise Prescription Changes):   Nutrition:  Target Goals: Understanding of nutrition guidelines, daily intake  of sodium <1585m, cholesterol <2059m calories 30% from fat and 7% or less from saturated fats, daily to have 5 or more servings of fruits and vegetables.  Biometrics:  Pre Biometrics - 08/13/19 1026      Pre Biometrics   Height 5' 5" (1.651 m)    Weight 78.8 kg    Waist Circumference 41 inches    Hip Circumference 41 inches    Waist to Hip Ratio 1 %    BMI (Calculated) 28.92    Triceps Skinfold 21 mm    % Body Fat 40 %    Grip Strength 24.23 kg    Flexibility 14.33 in    Single Leg Stand 4.27 seconds            Nutrition Therapy Plan and Nutrition Goals:  Nutrition Therapy & Goals - 08/26/19 0937      Personal Nutrition Goals   Comments We continue to work with our RD to schedule RD classes. In the interim, we are providing education through handouts. She scored a 56 on her medficts diet assessment. She admits she needs to work on eating healthier.      Intervention Plan   Intervention Nutrition handout(s) given to patient.           Nutrition Assessments:  Nutrition Assessments - 08/13/19 1149      MEDFICTS Scores   Pre Score 56           Nutrition Goals Re-Evaluation:   Nutrition Goals Discharge (Final Nutrition Goals Re-Evaluation):   Psychosocial: Target Goals: Acknowledge presence or absence of significant depression and/or stress, maximize coping skills, provide positive support system. Participant is able to verbalize types and ability to use techniques and skills needed for reducing stress and depression.  Initial Review & Psychosocial Screening:  Initial Psych Review & Screening - 08/13/19 1144      Initial Review   Current issues with Current Depression   Mostly due to new diagnosis  and being sad when thinkin of her mother.     Family Dynamics   Good Support System? Yes      Barriers   Psychosocial barriers to participate in program There are no identifiable barriers or psychosocial needs.;The patient should benefit from training in stress management and relaxation.      Screening Interventions   Interventions Encouraged to exercise    Expected Outcomes Short Term goal: Identification and review with participant of any Quality of Life or Depression concerns found by scoring the questionnaire.;Long Term goal: The participant improves quality of Life and PHQ9 Scores as seen by post scores and/or verbalization of changes           Quality of Life Scores:  Quality of Life - 08/13/19 1024      Quality of Life   Select Quality of Life      Quality of Life Scores   Health/Function Pre 13.5 %    Socioeconomic Pre 29.14 %    Psych/Spiritual Pre 24.43 %    Family Pre 30 %    GLOBAL Pre 21.17 %          Scores of 19 and below usually indicate a poorer quality of life in these areas.  A difference of  2-3 points is a clinically meaningful difference.  A difference of 2-3 points in the total score of the Quality of Life Index has been associated with significant improvement in overall quality of life, self-image, physical symptoms, and general health in studies assessing change  in quality of life.   PHQ-9: Recent Review Flowsheet Data    Depression screen [facility] 2/9 08/13/2019   Decreased Interest 0   Down, Depressed, Hopeless 2   PHQ - 2 Score 2   Altered sleeping 2   Tired, decreased energy 3   Change in appetite 1   Feeling bad or failure about yourself  0   Trouble concentrating 0   Moving slowly or fidgety/restless 0   Suicidal thoughts 0   PHQ-9 Score 8   Difficult doing work/chores Somewhat difficult     Interpretation of Total Score  Total Score Depression Severity:  1-4 = Minimal depression, 5-9 = Mild depression, 10-14 = Moderate depression, 15-19  = Moderately severe depression, 20-27 = Severe depression   Psychosocial Evaluation and Intervention:  Psychosocial Evaluation - 08/13/19 1145      Psychosocial Evaluation & Interventions   Interventions Encouraged to exercise with the program and follow exercise prescription    Comments Patient is really feeling she needs to be able to get back to exercise and her regular ADL's and she will feel better.    Expected Outcomes To be able to get back to the gym, working her flower beds and walking the trails.    Continue Psychosocial Services  No Follow up required           Psychosocial Re-Evaluation:  Psychosocial Re-Evaluation    [facility] Name 08/26/19 [phone]             Psychosocial Re-Evaluation   Current issues with Current Depression       Comments Patient's initial QOL score was 21.78 and her PHQ-9 score was 8. She is currently being treated for depression with Savella 50 mg BID and Clonazepam 0.5 mg daily. She attributes her depression on her ILD diagnosis and feels it is managed with the medication. Will continue to monitor.       Expected Outcomes Patient will have no additional psychosocial issues identified at discharge and her depression will continue to be managed on medication.       Interventions Encouraged to attend Pulmonary Rehabilitation for the exercise;Relaxation education;Stress management education       Continue Psychosocial Services  Follow up required by staff              Psychosocial Discharge (Final Psychosocial Re-Evaluation):  Psychosocial Re-Evaluation - 08/26/19 0939      Psychosocial Re-Evaluation   Current issues with Current Depression    Comments Patient's initial QOL score was 21.78 and her PHQ-9 score was 8. She is currently being treated for depression with Savella 50 mg BID and Clonazepam 0.5 mg daily. She attributes her depression on her ILD diagnosis and feels it is managed with the medication. Will continue to monitor.    Expected Outcomes  Patient will have no additional psychosocial issues identified at discharge and her depression will continue to be managed on medication.    Interventions Encouraged to attend Pulmonary Rehabilitation for the exercise;Relaxation education;Stress management education    Continue Psychosocial Services  Follow up required by staff            Education: Education Goals: Education classes will be provided on a weekly basis, covering required topics. Participant will state understanding/return demonstration of topics presented.  Learning Barriers/Preferences:  Learning Barriers/Preferences - 08/13/19 1149      Learning Barriers/Preferences   Learning Barriers None    Learning Preferences Computer/Internet;Group Instruction;Individual Instruction;Pictoral;Written Material;Verbal Instruction;Skilled Demonstration;Video  Education Topics: How Lungs Work and Diseases: - Discuss the anatomy of the lungs and diseases that can affect the lungs, such as COPD.   Exercise: -Discuss the importance of exercise, FITT principles of exercise, normal and abnormal responses to exercise, and how to exercise safely.   Environmental Irritants: -Discuss types of environmental irritants and how to limit exposure to environmental irritants.   Meds/Inhalers and oxygen: - Discuss respiratory medications, definition of an inhaler and oxygen, and the proper way to use an inhaler and oxygen.   Energy Saving Techniques: - Discuss methods to conserve energy and decrease shortness of breath when performing activities of daily living.    Bronchial Hygiene / Breathing Techniques: - Discuss breathing mechanics, pursed-lip breathing technique,  proper posture, effective ways to clear airways, and other functional breathing techniques   Cleaning Equipment: - Provides group verbal and written instruction about the health risks of elevated stress, cause of high stress, and healthy ways to reduce  stress.   Nutrition I: Fats: - Discuss the types of cholesterol, what cholesterol does to the body, and how cholesterol levels can be controlled.   Nutrition II: Labels: -Discuss the different components of food labels and how to read food labels.   Respiratory Infections: - Discuss the signs and symptoms of respiratory infections, ways to prevent respiratory infections, and the importance of seeking medical treatment when having a respiratory infection.   Stress I: Signs and Symptoms: - Discuss the causes of stress, how stress may lead to anxiety and depression, and ways to limit stress.   PULMONARY REHAB OTHER RESPIRATORY from 08/28/2019 in [facility]  Date 08/28/19  Educator Etheleen Mayhew  Instruction Review Code 1- Verbalizes Understanding      Stress II: Relaxation: -Discuss relaxation techniques to limit stress.   Oxygen for Home/Travel: - Discuss how to prepare for travel when on oxygen and proper ways to transport and store oxygen to ensure safety.   Knowledge Questionnaire Score:  Knowledge Questionnaire Score - 08/13/19 1150      Knowledge Questionnaire Score   Pre Score 14/18           Core Components/Risk Factors/Patient Goals at Admission:  Personal Goals and Risk Factors at Admission - 08/13/19 1150      Core Components/Risk Factors/Patient Goals on Admission    Weight Management Weight Maintenance    Personal Goal Other Yes    Personal Goal Patient is hoping she will be able to do the things she use to do like going to the gym, working her flower beds, and walking the trials.    Intervention Attend PR 2 x week and to supplement with home exercise plan 3 x week.    Expected Outcomes Reach expected goals.           Core Components/Risk Factors/Patient Goals Review:   Goals and Risk Factor Review    Row Name 08/26/19 [phone]             Core Components/Risk Factors/Patient Goals Review   Personal Goals Review Weight  Management/Obesity  Go to gym; work on [employer] garden and walk trails w/o difficulty.       Review Patient is new to the program completing 2 sessions. She has missed 2 sessions due to knee pain which she attributes to exercising here. I told her we would try her on different equipment when she returns to see if the pain improves. She is in aggreement. Will continue to monitor for progress.  Expected Outcomes Patient will continue to attend sessions and complete the program meeting both program and personal goals.              Core Components/Risk Factors/Patient Goals at Discharge (Final Review):   Goals and Risk Factor Review - 08/26/19 0942      Core Components/Risk Factors/Patient Goals Review   Personal Goals Review Weight Management/Obesity   Go to gym; work on [employer] garden and walk trails w/o difficulty.   Review Patient is new to the program completing 2 sessions. She has missed 2 sessions due to knee pain which she attributes to exercising here. I told her we would try her on different equipment when she returns to see if the pain improves. She is in aggreement. Will continue to monitor for progress.    Expected Outcomes Patient will continue to attend sessions and complete the program meeting both program and personal goals.           ITP Comments:  ITP Comments    Row Name 08/13/19 1130           ITP Comments Patient is eager to get started.              Comments: ITP REVIEW Pt is making expected progress toward pulmonary rehab goals after completing 3 sessions. Recommend continued exercise, life style modification, education, and utilization of breathing techniques to increase stamina and strength and decrease shortness of breath with exertion.

## 2019-08-28 NOTE — Progress Notes (Signed)
Daily Session Note  Patient Details  Name: Nichole Reyes MRN: [id number] Date of Birth: May 18, 1950 Referring Provider:     PULMONARY REHAB OTHER RESP ORIENTATION from 08/13/2019 in [place]  Referring Provider Dr. Chase Caller      Encounter Date: 08/28/2019  Check In:  Session Check In - 08/28/19 1045      Check-In   Supervising physician immediately available to respond to emergencies See telemetry face sheet for immediately available MD    Location [facility]    Staff Present Algis Downs, Exercise Physiologist;Catherina Pates Wynetta Emery, RN, BSN;Other    Virtual Visit No    Medication changes reported     No    Fall or balance concerns reported    No    Tobacco Cessation No Change    Warm-up and Cool-down Performed as group-led instruction    Resistance Training Performed Yes    VAD Patient? No    PAD/SET Patient? No      Pain Assessment   Currently in Pain? No/denies    Pain Score 0-No pain    Multiple Pain Sites No           Capillary Blood Glucose: No results found for this or any previous visit (from the past 24 hour(s)).    Social History   Tobacco Use  Smoking Status Never Smoker  Smokeless Tobacco Never Used    Goals Met:  Proper associated with RPD/PD & O2 Sat Independence with exercise equipment Improved SOB with ADL's Using PLB without cueing & demonstrates good technique Exercise tolerated well No report of cardiac concerns or symptoms Strength training completed today  Goals Unmet:  Not Applicable  Comments: Check out 1145.   Dr. Kathie Dike is Medical Director for [facility].

## 2019-09-02 ENCOUNTER — Encounter (HOSPITAL_COMMUNITY): Payer: Medicare Other

## 2019-09-04 ENCOUNTER — Encounter (HOSPITAL_COMMUNITY): Payer: Medicare Other

## 2019-09-05 ENCOUNTER — Ambulatory Visit: Payer: Self-pay

## 2019-09-05 ENCOUNTER — Other Ambulatory Visit: Payer: Self-pay

## 2019-09-05 ENCOUNTER — Ambulatory Visit: Payer: Medicare Other | Admitting: Rheumatology

## 2019-09-05 ENCOUNTER — Encounter: Payer: Self-pay | Admitting: Rheumatology

## 2019-09-05 VITALS — BP 115/65 | HR 90 | Resp 18 | Ht 65.0 in | Wt 174.8 lb

## 2019-09-05 DIAGNOSIS — M19072 Primary osteoarthritis, left ankle and foot: Secondary | ICD-10-CM

## 2019-09-05 DIAGNOSIS — Z8639 Personal history of other endocrine, nutritional and metabolic disease: Secondary | ICD-10-CM

## 2019-09-05 DIAGNOSIS — M25561 Pain in right knee: Secondary | ICD-10-CM | POA: Diagnosis not present

## 2019-09-05 DIAGNOSIS — R5383 Other fatigue: Secondary | ICD-10-CM

## 2019-09-05 DIAGNOSIS — G8929 Other chronic pain: Secondary | ICD-10-CM

## 2019-09-05 DIAGNOSIS — M19071 Primary osteoarthritis, right ankle and foot: Secondary | ICD-10-CM

## 2019-09-05 DIAGNOSIS — Z8669 Personal history of other diseases of the nervous system and sense organs: Secondary | ICD-10-CM

## 2019-09-05 DIAGNOSIS — M170 Bilateral primary osteoarthritis of knee: Secondary | ICD-10-CM | POA: Diagnosis not present

## 2019-09-05 DIAGNOSIS — M797 Fibromyalgia: Secondary | ICD-10-CM

## 2019-09-05 DIAGNOSIS — G4709 Other insomnia: Secondary | ICD-10-CM

## 2019-09-05 DIAGNOSIS — M5136 Other intervertebral disc degeneration, lumbar region: Secondary | ICD-10-CM

## 2019-09-05 DIAGNOSIS — M5030 Other cervical disc degeneration, unspecified cervical region: Secondary | ICD-10-CM | POA: Diagnosis not present

## 2019-09-05 DIAGNOSIS — M51369 Other intervertebral disc degeneration, lumbar region without mention of lumbar back pain or lower extremity pain: Secondary | ICD-10-CM

## 2019-09-05 DIAGNOSIS — M25512 Pain in left shoulder: Secondary | ICD-10-CM

## 2019-09-05 DIAGNOSIS — Z8679 Personal history of other diseases of the circulatory system: Secondary | ICD-10-CM

## 2019-09-05 DIAGNOSIS — Z8659 Personal history of other mental and behavioral disorders: Secondary | ICD-10-CM

## 2019-09-05 DIAGNOSIS — M5134 Other intervertebral disc degeneration, thoracic region: Secondary | ICD-10-CM

## 2019-09-05 MED ORDER — LIDOCAINE HCL 1 % IJ SOLN
1.5000 mL | INTRAMUSCULAR | Status: AC | PRN
  Administered 2019-09-05: 1.5 mL

## 2019-09-05 MED ORDER — TRIAMCINOLONE ACETONIDE 40 MG/ML IJ SUSP
40.0000 mg | INTRAMUSCULAR | Status: AC | PRN
Start: 2019-09-05 — End: 2019-09-05
  Administered 2019-09-05: 40 mg via INTRA_ARTICULAR

## 2019-09-09 ENCOUNTER — Encounter (HOSPITAL_COMMUNITY): Payer: Medicare Other

## 2019-09-11 ENCOUNTER — Encounter (HOSPITAL_COMMUNITY): Payer: Medicare Other

## 2019-09-16 ENCOUNTER — Encounter (HOSPITAL_COMMUNITY): Payer: Medicare Other

## 2019-09-17 DIAGNOSIS — E7849 Other hyperlipidemia: Secondary | ICD-10-CM | POA: Diagnosis not present

## 2019-09-17 DIAGNOSIS — M797 Fibromyalgia: Secondary | ICD-10-CM | POA: Diagnosis not present

## 2019-09-17 DIAGNOSIS — I10 Essential (primary) hypertension: Secondary | ICD-10-CM | POA: Diagnosis not present

## 2019-09-18 ENCOUNTER — Encounter (HOSPITAL_COMMUNITY): Payer: Medicare Other

## 2019-09-23 ENCOUNTER — Encounter (HOSPITAL_COMMUNITY): Payer: Medicare Other

## 2019-09-23 NOTE — Progress Notes (Deleted)
Office Visit Note  Patient: Nichole Reyes             Date of Birth: [birth year]           MRN: [id number]             PCP: Rory Percy, MD Referring: Rory Percy, MD Visit Date: 10/06/2019 Occupation: @GUAROCC @  Subjective:  No chief complaint on file.   History of Present Illness: Nichole Reyes is a 69 y.o. female ***   Activities of Daily Living:  Patient reports morning stiffness for *** {minute/hour:19697}.   Patient {ACTIONS;DENIES/REPORTS:21021675::"Denies"} nocturnal pain.  Difficulty dressing/grooming: {ACTIONS;DENIES/REPORTS:21021675::"Denies"} Difficulty climbing stairs: {ACTIONS;DENIES/REPORTS:21021675::"Denies"} Difficulty getting out of chair: {ACTIONS;DENIES/REPORTS:21021675::"Denies"} Difficulty using hands for taps, buttons, cutlery, and/or writing: {ACTIONS;DENIES/REPORTS:21021675::"Denies"}  No Rheumatology ROS completed.   PMFS History:  Patient Active Problem List   Diagnosis Date Noted   ILD (interstitial lung disease) ([place]) 08/26/2019   Fibromyalgia syndrome 06/26/2016   Other fatigue 06/26/2016   Other insomnia 06/26/2016   Arthralgia of both knees 06/26/2016   Pain in both feet 06/26/2016   Primary osteoarthritis of both feet 06/26/2016   Primary osteoarthritis of both knees 06/26/2016   DDD (degenerative disc disease), cervical 06/26/2016   DDD (degenerative disc disease), thoracic 06/26/2016   DDD (degenerative disc disease), lumbar 06/26/2016   History of hypertension 06/26/2016   History of high cholesterol 06/26/2016   Other sleep apnea 06/26/2016   History of osteopenia 06/26/2016   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   THYROID NODULE, LEFT 01/03/2008   HYPERLIPIDEMIA 01/03/2008   DEPRESSION 01/03/2008   MIGRAINE HEADACHE 01/03/2008   ALLERGIC RHINITIS 01/03/2008   ALOPECIA 01/03/2008   ALLERGIC ARTHRITIS SITE UNSPECIFIED 01/03/2008    Past Medical History:  Diagnosis Date   Diabetes  mellitus without complication (HCC)    Fibromyalgia    High cholesterol    Hypertension    Idiopathic pulmonary fibrosis ([place])    Osteoporosis     Family History  Problem Relation Age of Onset   Hypertension Mother    Diabetes Mother    Osteoporosis Mother    Hypertension Father    Heart disease Father    COPD Father    Colon cancer Sister    Cancer Brother    Heart disease Brother    Emphysema Paternal Grandfather    Past Surgical History:  Procedure Laterality Date   ABDOMINAL HYSTERECTOMY  2007   CATARACT EXTRACTION Bilateral    Social History   Social History Narrative   Not on file   Immunization History  Administered Date(s) Administered   Fluad Quad(high Dose 65+) 01/29/2019   Influenza Split 12/18/2017   Influenza-Unspecified 11/18/2013   PFIZER SARS-COV-2 Vaccination 05/15/2019, 06/13/2019   Tdap 05/04/1999     Objective: Vital Signs: There were no vitals taken for this visit.   Physical Exam   Musculoskeletal Exam: ***  CDAI Exam: CDAI Score: -- Patient Global: --; Provider Global: -- Swollen: --; Tender: -- Joint Exam 10/06/2019   No joint exam has been documented for this visit   There is currently no information documented on the homunculus. Go to the Rheumatology activity and complete the homunculus joint exam.  Investigation: No additional findings.  Imaging: XR KNEE 3 VIEW RIGHT  Result Date: 09/05/2019 Mild medial compartment narrowing was noted.  No patellofemoral narrowing was noted.  No chondrocalcinosis was noted. Impression: These findings are consistent mild osteoarthritis.   Recent Labs: Lab Results  Component Value Date   WBC 10.5  03/15/2017   HGB 14.6 03/15/2017   PLT 439 (H) 03/15/2017   NA 140 03/15/2017   K 4.8 03/15/2017   CL 103 03/15/2017   CO2 31 03/15/2017   GLUCOSE 104 (H) 03/15/2017   BUN 12 03/15/2017   CREATININE 0.73 03/15/2017   BILITOT 0.3 08/26/2019   ALKPHOS 71 08/26/2019    AST 20 08/26/2019   ALT 18 08/26/2019   PROT 7.5 08/26/2019   ALBUMIN 4.4 08/26/2019   CALCIUM 10.0 03/15/2017   GFRAA 101 03/15/2017    Speciality Comments: No specialty comments available.  Procedures:  No procedures performed Allergies: Latex, Pseudoephedrine, Sulfa antibiotics, and Sulfonamide derivatives   Assessment / Plan:     Visit Diagnoses: Primary osteoarthritis of both knees  Chronic left shoulder pain  Primary osteoarthritis of both feet  DDD (degenerative disc disease), cervical  DDD (degenerative disc disease), thoracic  DDD (degenerative disc disease), lumbar  Fibromyalgia  Other fatigue  Other insomnia  History of migraine  History of hypercholesterolemia  History of hypertension  History of depression  Orders: No orders of the defined types were placed in this encounter.  No orders of the defined types were placed in this encounter.   Face-to-face time spent with patient was *** minutes. Greater than 50% of time was spent in counseling and coordination of care.  Follow-Up Instructions: No follow-ups on file.   Ofilia Neas, PA-C  Note - This record has been created using Dragon software.  Chart creation errors have been sought, but may not always  have been located. Such creation errors do not reflect on  the standard of medical care.

## 2019-09-24 ENCOUNTER — Other Ambulatory Visit: Payer: Self-pay | Admitting: Cardiovascular Disease

## 2019-09-24 NOTE — Addendum Note (Signed)
Encounter addended by: Dwana Melena, RN on: 09/24/2019 4:00 PM  Actions taken: Flowsheet accepted, Clinical Note Signed, Episode resolved

## 2019-09-24 NOTE — Progress Notes (Signed)
Discharge Progress Report  Patient Details  Name: Nichole Reyes MRN: [id number] Date of Birth: 03-13-1951 Referring Provider:     PULMONARY REHAB OTHER RESP ORIENTATION from 08/13/2019 in [place]  Referring Provider Dr. Chase Caller       Number of Visits: 3  Reason for Discharge:  Early Exit:  Personal  Smoking History:  Social History   Tobacco Use  Smoking Status Never Smoker  Smokeless Tobacco Never Used    Diagnosis:  ILD (interstitial lung disease) ([place])  ADL UCSD:  Pulmonary Assessment Scores    Row Name 08/13/19 1143         ADL UCSD   ADL Phase Entry     SOB Score total 78     Walk 10     Stairs 2     Bath 2     Dress 3     Shop 5       CAT Score   CAT Score 23       mMRC Score   mMRC Score 3            Initial Exercise Prescription:  Initial Exercise Prescription - 08/13/19 1100      Date of Initial Exercise RX and Referring Provider   Date 08/13/19    Referring Provider Dr. Chase Caller    Expected Discharge Date 11/13/19      Treadmill   MPH 1.1    Grade 0    Minutes 17    METs 1.84      Recumbant Elliptical   Level 1    RPM 45    Watts 55    Minutes 22    METs 3.1      Prescription Details   Frequency (times per week) 2    Duration Progress to 30 minutes of continuous aerobic without signs/symptoms of physical distress      Intensity   THRR 40-80% of Max Heartrate [phone]    Ratings of Perceived Exertion 11-13    Perceived Dyspnea 0-4      Progression   Progression Continue progressive overload as per policy without signs/symptoms or physical distress.      Resistance Training   Training Prescription Yes    Weight 1    Reps 10-15           Discharge Exercise Prescription (Final Exercise Prescription Changes):  Exercise Prescription Changes - 09/01/19 1500      Response to Exercise   Blood Pressure (Admit) 110/62    Blood Pressure (Exercise) 108/60    Blood Pressure (Exit) 102/60     Heart Rate (Admit) 102 bpm    Heart Rate (Exercise) 109 bpm    Heart Rate (Exit) 94 bpm    Oxygen Saturation (Admit) 96 %    Oxygen Saturation (Exercise) 95 %    Oxygen Saturation (Exit) 96 %    Rating of Perceived Exertion (Exercise) 14    Perceived Dyspnea (Exercise) 14    Duration Continue with 30 min of aerobic exercise without signs/symptoms of physical distress.    Intensity THRR unchanged      Progression   Progression Continue to progress workloads to maintain intensity without signs/symptoms of physical distress.      Resistance Training   Training Prescription Yes    Weight 1    Reps 10-15      Treadmill   MPH 1.1    Grade 0    Minutes 17    METs 1.84  Arm Ergometer   Level 1    Watts 10    Minutes 22    METs 2           Functional Capacity:  6 Minute Walk    Row Name 08/13/19 1104         6 Minute Walk   Phase Initial     Distance 1100 feet     Walk Time 6 minutes     MPH 2.08     METS 2.08     RPE 13     Perceived Dyspnea  13     VO2 Peak 9.04     Symptoms Yes (comment)     Comments knee pain     Resting HR 80 bpm     Resting BP 120/70     Resting Oxygen Saturation  97 %     Exercise Oxygen Saturation  during 6 min walk 98 %     Max Ex. HR 101 bpm     Max Ex. BP 120/82     2 Minute Post BP 110/80            Psychological, QOL, Others - Outcomes: PHQ 2/9: Depression screen PHQ 2/9 08/13/2019  Decreased Interest 0  Down, Depressed, Hopeless 2  PHQ - 2 Score 2  Altered sleeping 2  Tired, decreased energy 3  Change in appetite 1  Feeling bad or failure about yourself  0  Trouble concentrating 0  Moving slowly or fidgety/restless 0  Suicidal thoughts 0  PHQ-9 Score 8  Difficult doing work/chores Somewhat difficult    Quality of Life:  Quality of Life - 08/13/19 1024      Quality of Life   Select Quality of Life      Quality of Life Scores   Health/Function Pre 13.5 %    Socioeconomic Pre 29.14 %    Psych/Spiritual  Pre 24.43 %    Family Pre 30 %    GLOBAL Pre 21.17 %           Personal Goals: Goals established at orientation with interventions provided to work toward goal.  Personal Goals and Risk Factors at Admission - 08/13/19 1150      Core Components/Risk Factors/Patient Goals on Admission    Weight Management Weight Maintenance    Personal Goal Other Yes    Personal Goal Patient is hoping she will be able to do the things she use to do like going to the gym, working her flower beds, and walking the trials.    Intervention Attend PR 2 x week and to supplement with home exercise plan 3 x week.    Expected Outcomes Reach expected goals.            Personal Goals Discharge:  Goals and Risk Factor Review    Row Name 08/26/19 [phone]             Core Components/Risk Factors/Patient Goals Review   Personal Goals Review Weight Management/Obesity  Go to gym; work on [employer] garden and walk trails w/o difficulty.       Review Patient is new to the program completing 2 sessions. She has missed 2 sessions due to knee pain which she attributes to exercising here. I told her we would try her on different equipment when she returns to see if the pain improves. She is in aggreement. Will continue to monitor for progress.       Expected Outcomes Patient will continue to attend sessions and  complete the program meeting both program and personal goals.              Exercise Goals and Review:  Exercise Goals    Row Name 08/13/19 1034             Exercise Goals   Increase Physical Activity Yes       Intervention Provide advice, education, support and counseling about physical activity/exercise needs.;Develop an individualized exercise prescription for aerobic and resistive training based on initial evaluation findings, risk stratification, comorbidities and participant's personal goals.       Expected Outcomes Long Term: Add in home exercise to make exercise part of routine and to increase amount  of physical activity.;Short Term: Attend rehab on a regular basis to increase amount of physical activity.       Increase Strength and Stamina Yes       Intervention Provide advice, education, support and counseling about physical activity/exercise needs.;Develop an individualized exercise prescription for aerobic and resistive training based on initial evaluation findings, risk stratification, comorbidities and participant's personal goals.       Expected Outcomes Short Term: Perform resistance training exercises routinely during rehab and add in resistance training at home;Long Term: Improve cardiorespiratory fitness, muscular endurance and strength as measured by increased METs and functional capacity (6MWT)       Able to understand and use rate of perceived exertion (RPE) scale Yes       Intervention Provide education and explanation on how to use RPE scale       Expected Outcomes Short Term: Able to use RPE daily in rehab to express subjective intensity level;Long Term:  Able to use RPE to guide intensity level when exercising independently       Able to understand and use Dyspnea scale Yes       Intervention Provide education and explanation on how to use Dyspnea scale       Expected Outcomes Short Term: Able to use Dyspnea scale daily in rehab to express subjective sense of shortness of breath during exertion;Long Term: Able to use Dyspnea scale to guide intensity level when exercising independently       Knowledge and understanding of Target Heart Rate Range (THRR) Yes       Intervention Provide education and explanation of THRR including how the numbers were predicted and where they are located for reference       Expected Outcomes Long Term: Able to use THRR to govern intensity when exercising independently;Short Term: Able to state/look up THRR       Able to check pulse independently Yes       Intervention Provide education and demonstration on how to check pulse in carotid and radial  arteries.;Review the importance of being able to check your own pulse for safety during independent exercise       Expected Outcomes Short Term: Able to explain why pulse checking is important during independent exercise;Long Term: Able to check pulse independently and accurately       Understanding of Exercise Prescription Yes       Intervention Provide education, explanation, and written materials on patient's individual exercise prescription       Expected Outcomes Short Term: Able to explain program exercise prescription;Long Term: Able to explain home exercise prescription to exercise independently              Exercise Goals Re-Evaluation:  Exercise Goals Re-Evaluation    [place] Name 08/18/19 [phone]  Exercise Goal Re-Evaluation   Exercise Goals Review Increase Physical Activity;Increase Strength and Stamina       Comments Nichole Reyes wants to be able to do the things she use to do such as go to the gym, work her flower beds and walk the trails. She has finished her orientation visit and will return on 08/19/19 to exercise. We will monitor her progress.       Expected Outcomes To reach her expected goals.              Nutrition & Weight - Outcomes:  Pre Biometrics - 08/13/19 1026      Pre Biometrics   Height 5\' 5"  (1.651 m)    Weight 78.8 kg    Waist Circumference 41 inches    Hip Circumference 41 inches    Waist to Hip Ratio 1 %    BMI (Calculated) 28.92    Triceps Skinfold 21 mm    % Body Fat 40 %    Grip Strength 24.23 kg    Flexibility 14.33 in    Single Leg Stand 4.27 seconds            Nutrition:  Nutrition Therapy & Goals - 08/26/19 0937      Personal Nutrition Goals   Comments We continue to work with our RD to schedule RD classes. In the interim, we are providing education through handouts. She scored a 56 on her medficts diet assessment. She admits she needs to work on eating healthier.      Intervention Plan   Intervention Nutrition handout(s)  given to patient.           Nutrition Discharge:  Nutrition Assessments - 08/13/19 1149      MEDFICTS Scores   Pre Score 56           Education Questionnaire Score:  Knowledge Questionnaire Score - 08/13/19 1150      Knowledge Questionnaire Score   Pre Score 14/18          Patient stopped attending the program 08/28/19 due to chronic pain issues which were exacerbated by exercising. She completed 3 sessions. MD will be notified.

## 2019-09-24 NOTE — Progress Notes (Signed)
Pulmonary Individual Treatment Plan  Patient Details  Name: Nichole Reyes MRN: [id number] Date of Birth: 07-25-1950 Referring Provider:     PULMONARY REHAB OTHER RESP ORIENTATION from 08/13/2019 in [facility]  Referring Provider Dr. Chase Caller      Initial Encounter Date:    [place] from 08/13/2019 in [facility]  Date 08/13/19      Visit Diagnosis: ILD (interstitial lung disease) ([place])  Patient's Home Medications on Admission:   Current Outpatient Medications:  .  ACCU-CHEK AVIVA PLUS test strip, , Disp: , Rfl:  .  Accu-Chek Softclix Lancets lancets, , Disp: , Rfl:  .  albuterol (VENTOLIN HFA) 108 (90 Base) MCG/ACT inhaler, 2 puffs every 4 (four) hours as needed. , Disp: , Rfl:  .  aspirin EC 81 MG tablet, Take 81 mg by mouth daily., Disp: , Rfl:  .  Blood Glucose Monitoring Suppl (ACCU-CHEK AVIVA PLUS) w/Device KIT, , Disp: , Rfl:  .  Calcium Carbonate (CALTRATE 600 PO), Take 1 tablet by mouth 2 (two) times a day., Disp: , Rfl:  .  clonazePAM (KLONOPIN) 0.5 MG tablet, Take 0.5 mg by mouth daily. , Disp: , Rfl:  .  ezetimibe (ZETIA) 10 MG tablet, Take 1 tablet (10 mg total) by mouth daily., Disp: 30 tablet, Rfl: 0 .  fenofibrate (TRICOR) 48 MG tablet, Take 48 mg by mouth daily. , Disp: , Rfl:  .  fluticasone (FLOVENT HFA) 110 MCG/ACT inhaler, Inhale 1 puff into the lungs 2 (two) times daily., Disp: 1 Inhaler, Rfl: 12 .  LOSARTAN POTASSIUM PO, Take by mouth., Disp: , Rfl:  .  metFORMIN (GLUCOPHAGE-XR) 500 MG 24 hr tablet, Take 500 mg by mouth daily with breakfast. , Disp: , Rfl:  .  metoprolol succinate (TOPROL-XL) 50 MG 24 hr tablet, Take 50 mg by mouth daily. , Disp: , Rfl:  .  naproxen (NAPROSYN) 500 MG tablet, naproxen 500 mg tablet, Disp: , Rfl:  .  omeprazole (PRILOSEC) 20 MG capsule, Take 1 capsule (20 mg total) by mouth daily., Disp: 30 capsule, Rfl: 11 .  Pirfenidone (ESBRIET) 267 MG TABS, Take 3  tablets by mouth 3 (three) times daily. 801 mg three times daily, Disp: , Rfl:  .  SAVELLA 50 MG TABS tablet, Take 50 mg by mouth 2 (two) times daily. , Disp: , Rfl:  .  tiZANidine (ZANAFLEX) 4 MG tablet, TAKE 1 TABLET BY MOUTH AT BEDTIME., Disp: 30 tablet, Rfl: 0 .  triamcinolone cream (KENALOG) 0.1 %, SMARTSIG:1 Application Topical 2-3 Times Daily, Disp: , Rfl:   Past Medical History: Past Medical History:  Diagnosis Date  . Diabetes mellitus without complication ([place])   . Fibromyalgia   . High cholesterol   . Hypertension   . Idiopathic pulmonary fibrosis ([place])   . Osteoporosis     Tobacco Use: Social History   Tobacco Use  Smoking Status Never Smoker  Smokeless Tobacco Never Used    Labs: Recent Review Flowsheet Data   There is no flowsheet data to display.     Capillary Blood Glucose: No results found for: GLUCAP   Pulmonary Assessment Scores:  Pulmonary Assessment Scores    Row Name 08/13/19 1143         ADL UCSD   ADL Phase Entry     SOB Score total 78     Walk 10     Stairs 2     Bath 2     Dress 3  Shop 5       CAT Score   CAT Score 23       mMRC Score   mMRC Score 3           UCSD: Self-administered rating of dyspnea associated with activities of daily living (ADLs) 6-point scale (0 = "not at all" to 5 = "maximal or unable to do because of breathlessness")  Scoring Scores range from 0 to 120.  Minimally important difference is 5 units  CAT: CAT can identify the health impairment of COPD patients and is better correlated with disease progression.  CAT has a scoring range of zero to 40. The CAT score is classified into four groups of low (less than 10), medium (10 - 20), high (21-30) and very high (31-40) based on the impact level of disease on health status. A CAT score over 10 suggests significant symptoms.  A worsening CAT score could be explained by an exacerbation, poor medication adherence, poor inhaler technique, or progression of  COPD or comorbid conditions.  CAT MCID is 2 points  mMRC: mMRC (Modified Medical Research Council) Dyspnea Scale is used to assess the degree of baseline functional disability in patients of respiratory disease due to dyspnea. No minimal important difference is established. A decrease in score of 1 point or greater is considered a positive change.   Pulmonary Function Assessment:  Pulmonary Function Assessment - 08/13/19 1141      Pulmonary Function Tests   FVC% 70 %    FEV1% 82 %    FEV1/FVC Ratio 116    RV% 50 %    DLCO% 77 %      Initial Spirometry Results   FVC% 70 %    FEV1% 82 %    FEV1/FVC Ratio 116      Post Bronchodilator Spirometry Results   FVC% 70 %    FEV1% 83 %    FEV1/FVC Ratio 117      Breath   Bilateral Breath Sounds Clear    Shortness of Breath Yes           Exercise Target Goals: Exercise Program Goal: Individual exercise prescription set using results from initial 6 min walk test and THRR while considering  patient's activity barriers and safety.   Exercise Prescription Goal: Initial exercise prescription builds to 30-45 minutes a day of aerobic activity, 2-3 days per week.  Home exercise guidelines will be given to patient during program as part of exercise prescription that the participant will acknowledge.  Activity Barriers & Risk Stratification:  Activity Barriers & Cardiac Risk Stratification - 08/13/19 1102      Activity Barriers & Cardiac Risk Stratification   Activity Barriers Deconditioning;Shortness of Breath;Other (comment)    Comments knee pain 5/10    Cardiac Risk Stratification Low           6 Minute Walk:  6 Minute Walk    Row Name 08/13/19 1104         6 Minute Walk   Phase Initial     Distance 1100 feet     Walk Time 6 minutes     MPH 2.08     METS 2.08     RPE 13     Perceived Dyspnea  13     VO2 Peak 9.04     Symptoms Yes (comment)     Comments knee pain     Resting HR 80 bpm     Resting BP 120/70      Resting  Oxygen Saturation  97 %     Exercise Oxygen Saturation  during 6 min walk 98 %     Max Ex. HR 101 bpm     Max Ex. BP 120/82     2 Minute Post BP 110/80            Oxygen Initial Assessment:  Oxygen Initial Assessment - 08/13/19 1141      Home Oxygen   Home Oxygen Device None    Sleep Oxygen Prescription None    Home Exercise Oxygen Prescription None    Home at Rest Exercise Oxygen Prescription None    Compliance with Home Oxygen Use No      Initial 6 min Walk   Oxygen Used None      Program Oxygen Prescription   Program Oxygen Prescription None           Oxygen Re-Evaluation:   Oxygen Discharge (Final Oxygen Re-Evaluation):   Initial Exercise Prescription:  Initial Exercise Prescription - 08/13/19 1100      Date of Initial Exercise RX and Referring Provider   Date 08/13/19    Referring Provider Dr. Chase Caller    Expected Discharge Date 11/13/19      Treadmill   MPH 1.1    Grade 0    Minutes 17    METs 1.84      Recumbant Elliptical   Level 1    RPM 45    Watts 55    Minutes 22    METs 3.1      Prescription Details   Frequency (times per week) 2    Duration Progress to 30 minutes of continuous aerobic without signs/symptoms of physical distress      Intensity   THRR 40-80% of Max Heartrate [phone]    Ratings of Perceived Exertion 11-13    Perceived Dyspnea 0-4      Progression   Progression Continue progressive overload as per policy without signs/symptoms or physical distress.      Resistance Training   Training Prescription Yes    Weight 1    Reps 10-15           Perform Capillary Blood Glucose checks as needed.  Exercise Prescription Changes:   Exercise Prescription Changes    Row Name 09/01/19 1500             Response to Exercise   Blood Pressure (Admit) 110/62       Blood Pressure (Exercise) 108/60       Blood Pressure (Exit) 102/60       Heart Rate (Admit) 102 bpm       Heart Rate (Exercise) 109 bpm        Heart Rate (Exit) 94 bpm       Oxygen Saturation (Admit) 96 %       Oxygen Saturation (Exercise) 95 %       Oxygen Saturation (Exit) 96 %       Rating of Perceived Exertion (Exercise) 14       Perceived Dyspnea (Exercise) 14       Duration Continue with 30 min of aerobic exercise without signs/symptoms of physical distress.       Intensity THRR unchanged         Progression   Progression Continue to progress workloads to maintain intensity without signs/symptoms of physical distress.         Resistance Training   Training Prescription Yes       Weight 1  Reps 10-15         Treadmill   MPH 1.1       Grade 0       Minutes 17       METs 1.84         Arm Ergometer   Level 1       Watts 10       Minutes 22       METs 2              Exercise Comments:   Exercise Comments    Row Name 08/13/19 1029 08/18/19 1524         Exercise Comments Patient completed walk test. Patient was short of breath and she did complain of knee concerns before starting. During walk test pain was 5/10 and pain subsided after two minute rest.  Patients gait was also comprimised during walking. Patient is looking foward to starting the program. Shivani will return on 08/19/19 to become fully engaged in the her exercise prescription. She will be walking the TM and riding the elliptical. She wll start with 1lb weights. We will progress as tolerated.             Exercise Goals and Review:   Exercise Goals    Row Name 08/13/19 1034             Exercise Goals   Increase Physical Activity Yes       Intervention Provide advice, education, support and counseling about physical activity/exercise needs.;Develop an individualized exercise prescription for aerobic and resistive training based on initial evaluation findings, risk stratification, comorbidities and participant's personal goals.       Expected Outcomes Long Term: Add in home exercise to make exercise part of routine and to increase amount  of physical activity.;Short Term: Attend rehab on a regular basis to increase amount of physical activity.       Increase Strength and Stamina Yes       Intervention Provide advice, education, support and counseling about physical activity/exercise needs.;Develop an individualized exercise prescription for aerobic and resistive training based on initial evaluation findings, risk stratification, comorbidities and participant's personal goals.       Expected Outcomes Short Term: Perform resistance training exercises routinely during rehab and add in resistance training at home;Long Term: Improve cardiorespiratory fitness, muscular endurance and strength as measured by increased METs and functional capacity (6MWT)       Able to understand and use rate of perceived exertion (RPE) scale Yes       Intervention Provide education and explanation on how to use RPE scale       Expected Outcomes Short Term: Able to use RPE daily in rehab to express subjective intensity level;Long Term:  Able to use RPE to guide intensity level when exercising independently       Able to understand and use Dyspnea scale Yes       Intervention Provide education and explanation on how to use Dyspnea scale       Expected Outcomes Short Term: Able to use Dyspnea scale daily in rehab to express subjective sense of shortness of breath during exertion;Long Term: Able to use Dyspnea scale to guide intensity level when exercising independently       Knowledge and understanding of Target Heart Rate Range (THRR) Yes       Intervention Provide education and explanation of THRR including how the numbers were predicted and where they are located for reference  Expected Outcomes Long Term: Able to use THRR to govern intensity when exercising independently;Short Term: Able to state/look up THRR       Able to check pulse independently Yes       Intervention Provide education and demonstration on how to check pulse in carotid and radial  arteries.;Review the importance of being able to check your own pulse for safety during independent exercise       Expected Outcomes Short Term: Able to explain why pulse checking is important during independent exercise;Long Term: Able to check pulse independently and accurately       Understanding of Exercise Prescription Yes       Intervention Provide education, explanation, and written materials on patient's individual exercise prescription       Expected Outcomes Short Term: Able to explain program exercise prescription;Long Term: Able to explain home exercise prescription to exercise independently              Exercise Goals Re-Evaluation :  Exercise Goals Re-Evaluation    Row Name 08/18/19 1522             Exercise Goal Re-Evaluation   Exercise Goals Review Increase Physical Activity;Increase Strength and Stamina       Comments Cecelia wants to be able to do the things she use to do such as go to the gym, work her flower beds and walk the trails. She has finished her orientation visit and will return on 08/19/19 to exercise. We will monitor her progress.       Expected Outcomes To reach her expected goals.              Discharge Exercise Prescription (Final Exercise Prescription Changes):  Exercise Prescription Changes - 09/01/19 1500      Response to Exercise   Blood Pressure (Admit) 110/62    Blood Pressure (Exercise) 108/60    Blood Pressure (Exit) 102/60    Heart Rate (Admit) 102 bpm    Heart Rate (Exercise) 109 bpm    Heart Rate (Exit) 94 bpm    Oxygen Saturation (Admit) 96 %    Oxygen Saturation (Exercise) 95 %    Oxygen Saturation (Exit) 96 %    Rating of Perceived Exertion (Exercise) 14    Perceived Dyspnea (Exercise) 14    Duration Continue with 30 min of aerobic exercise without signs/symptoms of physical distress.    Intensity THRR unchanged      Progression   Progression Continue to progress workloads to maintain intensity without signs/symptoms of  physical distress.      Resistance Training   Training Prescription Yes    Weight 1    Reps 10-15      Treadmill   MPH 1.1    Grade 0    Minutes 17    METs 1.84      Arm Ergometer   Level 1    Watts 10    Minutes 22    METs 2           Nutrition:  Target Goals: Understanding of nutrition guidelines, daily intake of sodium <1583m, cholesterol <2035m calories 30% from fat and 7% or less from saturated fats, daily to have 5 or more servings of fruits and vegetables.  Biometrics:  Pre Biometrics - 08/13/19 1026      Pre Biometrics   Height _0  (1.651 m)    Weight 78.8 kg    Waist Circumference 41 inches    Hip Circumference 41 inches  Waist to Hip Ratio 1 %    BMI (Calculated) 28.92    Triceps Skinfold 21 mm    % Body Fat 40 %    Grip Strength 24.23 kg    Flexibility 14.33 in    Single Leg Stand 4.27 seconds            Nutrition Therapy Plan and Nutrition Goals:  Nutrition Therapy & Goals - 08/26/19 0937      Personal Nutrition Goals   Comments We continue to work with our RD to schedule RD classes. In the interim, we are providing education through handouts. She scored a 56 on her medficts diet assessment. She admits she needs to work on eating healthier.      Intervention Plan   Intervention Nutrition handout(s) given to patient.           Nutrition Assessments:  Nutrition Assessments - 08/13/19 1149      MEDFICTS Scores   Pre Score 56           Nutrition Goals Re-Evaluation:   Nutrition Goals Discharge (Final Nutrition Goals Re-Evaluation):   Psychosocial: Target Goals: Acknowledge presence or absence of significant depression and/or stress, maximize coping skills, provide positive support system. Participant is able to verbalize types and ability to use techniques and skills needed for reducing stress and depression.  Initial Review & Psychosocial Screening:  Initial Psych Review & Screening - 08/13/19 1144      Initial Review    Current issues with Current Depression   Mostly due to new diagnosis and being sad when thinkin of her mother.     Family Dynamics   Good Support System? Yes      Barriers   Psychosocial barriers to participate in program There are no identifiable barriers or psychosocial needs.;The patient should benefit from training in stress management and relaxation.      Screening Interventions   Interventions Encouraged to exercise    Expected Outcomes Short Term goal: Identification and review with participant of any Quality of Life or Depression concerns found by scoring the questionnaire.;Long Term goal: The participant improves quality of Life and PHQ9 Scores as seen by post scores and/or verbalization of changes           Quality of Life Scores:  Quality of Life - 08/13/19 1024      Quality of Life   Select Quality of Life      Quality of Life Scores   Health/Function Pre 13.5 %    Socioeconomic Pre 29.14 %    Psych/Spiritual Pre 24.43 %    Family Pre 30 %    GLOBAL Pre 21.17 %          Scores of 19 and below usually indicate a poorer quality of life in these areas.  A difference of  2-3 points is a clinically meaningful difference.  A difference of 2-3 points in the total score of the Quality of Life Index has been associated with significant improvement in overall quality of life, self-image, physical symptoms, and general health in studies assessing change in quality of life.   PHQ-9: Recent Review Flowsheet Data    Depression screen [facility] 2/9 08/13/2019   Decreased Interest 0   Down, Depressed, Hopeless 2   PHQ - 2 Score 2   Altered sleeping 2   Tired, decreased energy 3   Change in appetite 1   Feeling bad or failure about yourself  0   Trouble concentrating 0   Moving  slowly or fidgety/restless 0   Suicidal thoughts 0   PHQ-9 Score 8   Difficult doing work/chores Somewhat difficult     Interpretation of Total Score  Total Score Depression Severity:  1-4 = Minimal  depression, 5-9 = Mild depression, 10-14 = Moderate depression, 15-19 = Moderately severe depression, 20-27 = Severe depression   Psychosocial Evaluation and Intervention:  Psychosocial Evaluation - 08/13/19 1145      Psychosocial Evaluation & Interventions   Interventions Encouraged to exercise with the program and follow exercise prescription    Comments Patient is really feeling she needs to be able to get back to exercise and her regular ADL's and she will feel better.    Expected Outcomes To be able to get back to the gym, working her flower beds and walking the trails.    Continue Psychosocial Services  No Follow up required           Psychosocial Re-Evaluation:  Psychosocial Re-Evaluation    [place] Name 08/26/19 [phone]             Psychosocial Re-Evaluation   Current issues with Current Depression       Comments Patient's initial QOL score was 21.78 and her PHQ-9 score was 8. She is currently being treated for depression with Savella 50 mg BID and Clonazepam 0.5 mg daily. She attributes her depression on her ILD diagnosis and feels it is managed with the medication. Will continue to monitor.       Expected Outcomes Patient will have no additional psychosocial issues identified at discharge and her depression will continue to be managed on medication.       Interventions Encouraged to attend Pulmonary Rehabilitation for the exercise;Relaxation education;Stress management education       Continue Psychosocial Services  Follow up required by staff              Psychosocial Discharge (Final Psychosocial Re-Evaluation):  Psychosocial Re-Evaluation - 08/26/19 0939      Psychosocial Re-Evaluation   Current issues with Current Depression    Comments Patient's initial QOL score was 21.78 and her PHQ-9 score was 8. She is currently being treated for depression with Savella 50 mg BID and Clonazepam 0.5 mg daily. She attributes her depression on her ILD diagnosis and feels it is managed  with the medication. Will continue to monitor.    Expected Outcomes Patient will have no additional psychosocial issues identified at discharge and her depression will continue to be managed on medication.    Interventions Encouraged to attend Pulmonary Rehabilitation for the exercise;Relaxation education;Stress management education    Continue Psychosocial Services  Follow up required by staff            Education: Education Goals: Education classes will be provided on a weekly basis, covering required topics. Participant will state understanding/return demonstration of topics presented.  Learning Barriers/Preferences:  Learning Barriers/Preferences - 08/13/19 1149      Learning Barriers/Preferences   Learning Barriers None    Learning Preferences Computer/Internet;Group Instruction;Individual Instruction;Pictoral;Written Material;Verbal Instruction;Skilled Demonstration;Video           Education Topics: How Lungs Work and Diseases: - Discuss the anatomy of the lungs and diseases that can affect the lungs, such as COPD.   Exercise: -Discuss the importance of exercise, FITT principles of exercise, normal and abnormal responses to exercise, and how to exercise safely.   Environmental Irritants: -Discuss types of environmental irritants and how to limit exposure to environmental irritants.   Meds/Inhalers and oxygen: -  Discuss respiratory medications, definition of an inhaler and oxygen, and the proper way to use an inhaler and oxygen.   Energy Saving Techniques: - Discuss methods to conserve energy and decrease shortness of breath when performing activities of daily living.    Bronchial Hygiene / Breathing Techniques: - Discuss breathing mechanics, pursed-lip breathing technique,  proper posture, effective ways to clear airways, and other functional breathing techniques   Cleaning Equipment: - Provides group verbal and written instruction about the health risks of  elevated stress, cause of high stress, and healthy ways to reduce stress.   Nutrition I: Fats: - Discuss the types of cholesterol, what cholesterol does to the body, and how cholesterol levels can be controlled.   Nutrition II: Labels: -Discuss the different components of food labels and how to read food labels.   Respiratory Infections: - Discuss the signs and symptoms of respiratory infections, ways to prevent respiratory infections, and the importance of seeking medical treatment when having a respiratory infection.   Stress I: Signs and Symptoms: - Discuss the causes of stress, how stress may lead to anxiety and depression, and ways to limit stress.   PULMONARY REHAB OTHER RESPIRATORY from 08/28/2019 in [place]  Date 08/28/19  Educator Etheleen Mayhew  Instruction Review Code 1- Verbalizes Understanding      Stress II: Relaxation: -Discuss relaxation techniques to limit stress.   Oxygen for Home/Travel: - Discuss how to prepare for travel when on oxygen and proper ways to transport and store oxygen to ensure safety.   Knowledge Questionnaire Score:  Knowledge Questionnaire Score - 08/13/19 1150      Knowledge Questionnaire Score   Pre Score 14/18           Core Components/Risk Factors/Patient Goals at Admission:  Personal Goals and Risk Factors at Admission - 08/13/19 1150      Core Components/Risk Factors/Patient Goals on Admission    Weight Management Weight Maintenance    Personal Goal Other Yes    Personal Goal Patient is hoping she will be able to do the things she use to do like going to the gym, working her flower beds, and walking the trials.    Intervention Attend PR 2 x week and to supplement with home exercise plan 3 x week.    Expected Outcomes Reach expected goals.           Core Components/Risk Factors/Patient Goals Review:   Goals and Risk Factor Review    Row Name 08/26/19 [phone]             Core Components/Risk  Factors/Patient Goals Review   Personal Goals Review Weight Management/Obesity  Go to gym; work on [employer] garden and walk trails w/o difficulty.       Review Patient is new to the program completing 2 sessions. She has missed 2 sessions due to knee pain which she attributes to exercising here. I told her we would try her on different equipment when she returns to see if the pain improves. She is in aggreement. Will continue to monitor for progress.       Expected Outcomes Patient will continue to attend sessions and complete the program meeting both program and personal goals.              Core Components/Risk Factors/Patient Goals at Discharge (Final Review):   Goals and Risk Factor Review - 08/26/19 0942      Core Components/Risk Factors/Patient Goals Review   Personal Goals Review Weight Management/Obesity  Go to gym; work on [employer] garden and walk trails w/o difficulty.   Review Patient is new to the program completing 2 sessions. She has missed 2 sessions due to knee pain which she attributes to exercising here. I told her we would try her on different equipment when she returns to see if the pain improves. She is in aggreement. Will continue to monitor for progress.    Expected Outcomes Patient will continue to attend sessions and complete the program meeting both program and personal goals.           ITP Comments:  ITP Comments    Row Name 08/13/19 1130 09/24/19 1555         ITP Comments Patient is eager to get started. Patient stopped attending the program 08/28/19 due to chronic pain issues which were exacerbated by exercising. She completed 3 sessions. MD will be notified.             Comments: Patient stopped attending the program 08/28/19 due to chronic pain issues which were exacerbated by exercising. She completed 3 sessions. MD will be notified.

## 2019-09-25 ENCOUNTER — Encounter (HOSPITAL_COMMUNITY): Payer: Medicare Other

## 2019-09-30 ENCOUNTER — Encounter (HOSPITAL_COMMUNITY): Payer: Medicare Other

## 2019-10-01 NOTE — Progress Notes (Deleted)
Office Visit Note  Patient: Nichole Reyes             Date of Birth: Aug 11, 1950           MRN: [id number]             PCP: Rory Percy, MD Referring: Rory Percy, MD Visit Date: 10/08/2019 Occupation: @GUAROCC @  Subjective:  No chief complaint on file.   History of Present Illness: Nichole Reyes is a 69 y.o. female ***   Activities of Daily Living:  Patient reports morning stiffness for *** {minute/hour:19697}.   Patient {ACTIONS;DENIES/REPORTS:21021675::"Denies"} nocturnal pain.  Difficulty dressing/grooming: {ACTIONS;DENIES/REPORTS:21021675::"Denies"} Difficulty climbing stairs: {ACTIONS;DENIES/REPORTS:21021675::"Denies"} Difficulty getting out of chair: {ACTIONS;DENIES/REPORTS:21021675::"Denies"} Difficulty using hands for taps, buttons, cutlery, and/or writing: {ACTIONS;DENIES/REPORTS:21021675::"Denies"}  No Rheumatology ROS completed.   PMFS History:  Patient Active Problem List   Diagnosis Date Noted  . ILD (interstitial lung disease) ([place]) 08/26/2019  . Fibromyalgia syndrome 06/26/2016  . Other fatigue 06/26/2016  . Other insomnia 06/26/2016  . Arthralgia of both knees 06/26/2016  . Pain in both feet 06/26/2016  . Primary osteoarthritis of both feet 06/26/2016  . Primary osteoarthritis of both knees 06/26/2016  . DDD (degenerative disc disease), cervical 06/26/2016  . 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 DDD (degenerative disc disease), lumbar 06/26/2016  . History of hypertension 06/26/2016  . History of high cholesterol 06/26/2016  . Other sleep apnea 06/26/2016  . History of osteopenia 06/26/2016  . 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 THYROID NODULE, LEFT 01/03/2008  . HYPERLIPIDEMIA 01/03/2008  . DEPRESSION 01/03/2008  . MIGRAINE HEADACHE 01/03/2008  . ALLERGIC RHINITIS 01/03/2008  . ALOPECIA 01/03/2008  . ALLERGIC ARTHRITIS SITE UNSPECIFIED 01/03/2008    Past Medical History:  Diagnosis Date  . Diabetes  mellitus without complication ([place])   . Fibromyalgia   . High cholesterol   . Hypertension   . Idiopathic pulmonary fibrosis ([place])   . Osteoporosis     Family History  Problem Relation Age of Onset  . Hypertension Mother   . Diabetes Mother   . Osteoporosis Mother   . Hypertension Father   . Heart disease Father   . COPD Father   . Colon cancer Sister   . Cancer Brother   . Heart disease Brother   . Emphysema Paternal Grandfather    Past Surgical History:  Procedure Laterality Date  . ABDOMINAL HYSTERECTOMY  2007  . CATARACT EXTRACTION Bilateral    Social History   Social History Narrative  . Not on file   Immunization History  Administered Date(s) Administered  . Fluad Quad(high Dose 65+) 01/29/2019  . Influenza Split 12/18/2017  . Influenza-Unspecified 11/18/2013  . PFIZER SARS-COV-2 Vaccination 05/15/2019, 06/13/2019  . Tdap 05/04/1999     Objective: Vital Signs: There were no vitals taken for this visit.   Physical Exam   Musculoskeletal Exam: ***  CDAI Exam: CDAI Score: -- Patient Global: --; Provider Global: -- Swollen: --; Tender: -- Joint Exam 10/08/2019   No joint exam has been documented for this visit   There is currently no information documented on the homunculus. Go to the Rheumatology activity and complete the homunculus joint exam.  Investigation: No additional findings.  Imaging: XR KNEE 3 VIEW RIGHT  Result Date: 09/05/2019 Mild medial compartment narrowing was noted.  No patellofemoral narrowing was noted.  No chondrocalcinosis was noted. Impression: These findings are consistent mild osteoarthritis.   Recent Labs: Lab Results  Component Value Date   WBC 10.5  03/15/2017   HGB 14.6 03/15/2017   PLT 439 (H) 03/15/2017   NA 140 03/15/2017   K 4.8 03/15/2017   CL 103 03/15/2017   CO2 31 03/15/2017   GLUCOSE 104 (H) 03/15/2017   BUN 12 03/15/2017   CREATININE 0.73 03/15/2017   BILITOT 0.3 08/26/2019   ALKPHOS 71 08/26/2019    AST 20 08/26/2019   ALT 18 08/26/2019   PROT 7.5 08/26/2019   ALBUMIN 4.4 08/26/2019   CALCIUM 10.0 03/15/2017   GFRAA 101 03/15/2017    Speciality Comments: No specialty comments available.  Procedures:  No procedures performed Allergies: Latex, Pseudoephedrine, Sulfa antibiotics, and Sulfonamide derivatives   Assessment / Plan:     Visit Diagnoses: No diagnosis found.  Orders: No orders of the defined types were placed in this encounter.  No orders of the defined types were placed in this encounter.   Face-to-face time spent with patient was *** minutes. Greater than 50% of time was spent in counseling and coordination of care.  Follow-Up Instructions: No follow-ups on file.   Ofilia Neas, PA-C  Note - This record has been created using Dragon software.  Chart creation errors have been sought, but may not always  have been located. Such creation errors do not reflect on  the standard of medical care.

## 2019-10-02 ENCOUNTER — Encounter (HOSPITAL_COMMUNITY): Payer: Medicare Other

## 2019-10-03 ENCOUNTER — Other Ambulatory Visit: Payer: Self-pay | Admitting: Rheumatology

## 2019-10-03 NOTE — Telephone Encounter (Signed)
Last Visit: 09/05/2019 Next Visit: 10/08/2019  Last Fill: 08/27/2019  Okay to refill Tizanidine?

## 2019-10-06 ENCOUNTER — Ambulatory Visit: Payer: Medicare Other | Admitting: Physician Assistant

## 2019-10-07 ENCOUNTER — Encounter (HOSPITAL_COMMUNITY): Payer: Medicare Other

## 2019-10-08 ENCOUNTER — Ambulatory Visit: Payer: Medicare Other | Admitting: Physician Assistant

## 2019-10-09 ENCOUNTER — Encounter (HOSPITAL_COMMUNITY): Payer: Medicare Other

## 2019-10-14 ENCOUNTER — Encounter (HOSPITAL_COMMUNITY): Payer: Medicare Other

## 2019-10-16 ENCOUNTER — Encounter (HOSPITAL_COMMUNITY): Payer: Medicare Other

## 2019-10-21 ENCOUNTER — Encounter (HOSPITAL_COMMUNITY): Payer: Medicare Other

## 2019-10-23 ENCOUNTER — Encounter (HOSPITAL_COMMUNITY): Payer: Medicare Other

## 2019-10-23 ENCOUNTER — Other Ambulatory Visit: Payer: Self-pay | Admitting: Physician Assistant

## 2019-10-27 ENCOUNTER — Other Ambulatory Visit: Payer: Self-pay | Admitting: *Deleted

## 2019-10-27 MED ORDER — TIZANIDINE HCL 4 MG PO TABS: 4.0000 mg | ORAL_TABLET | Freq: Every day | ORAL | 0 refills | Status: DC

## 2019-10-27 NOTE — Telephone Encounter (Signed)
Please schedule patient for a follow up visit. Patient was due July 2021. Thanks!  

## 2019-10-27 NOTE — Telephone Encounter (Signed)
Last Visit: 09/05/2019 Next Visit: sue July 2021. Message sent to the front to schedule patient.   Last Fill: 10/03/2019   Okay to refill Tizanidine?

## 2019-10-28 ENCOUNTER — Encounter (HOSPITAL_COMMUNITY): Payer: Medicare Other

## 2019-10-28 NOTE — Telephone Encounter (Signed)
Called patient to schedule follow-up appointment that was due in July.  Patient states the July appointment was a 4 week follow-up appointment to check on her knee.  Patient states she cancelled that appointment due to feeling much better.  Patient states she will call back to schedule her 6 month follow-up appointment when she returns to [facility].

## 2019-10-30 ENCOUNTER — Encounter (HOSPITAL_COMMUNITY): Payer: Medicare Other

## 2019-11-03 ENCOUNTER — Telehealth: Payer: Self-pay | Admitting: Internal Medicine

## 2019-11-03 NOTE — Telephone Encounter (Signed)
Last CT July 2020. Last seen by me feb 2021 around which time esbriet started. Last PFT and seen by APP - June 2021. 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

## 2019-11-04 ENCOUNTER — Encounter (HOSPITAL_COMMUNITY): Payer: Medicare Other

## 2019-11-05 NOTE — Telephone Encounter (Signed)
Pt is scheduled for a follow up with MR 11/27/19. Pt can have labwork done at that visit. Nothing further needed.

## 2019-11-06 ENCOUNTER — Encounter (HOSPITAL_COMMUNITY): Payer: Medicare Other

## 2019-11-11 ENCOUNTER — Encounter (HOSPITAL_COMMUNITY): Payer: Medicare Other

## 2019-11-13 ENCOUNTER — Encounter (HOSPITAL_COMMUNITY): Payer: Medicare Other

## 2019-11-18 ENCOUNTER — Encounter (HOSPITAL_COMMUNITY): Payer: Medicare Other

## 2019-11-18 DIAGNOSIS — J8410 Pulmonary fibrosis, unspecified: Secondary | ICD-10-CM | POA: Diagnosis not present

## 2019-11-18 DIAGNOSIS — M797 Fibromyalgia: Secondary | ICD-10-CM | POA: Diagnosis not present

## 2019-11-18 DIAGNOSIS — E7849 Other hyperlipidemia: Secondary | ICD-10-CM | POA: Diagnosis not present

## 2019-11-18 DIAGNOSIS — I10 Essential (primary) hypertension: Secondary | ICD-10-CM | POA: Diagnosis not present

## 2019-11-20 ENCOUNTER — Encounter (HOSPITAL_COMMUNITY): Payer: Medicare Other

## 2019-11-25 ENCOUNTER — Encounter (HOSPITAL_COMMUNITY): Payer: Medicare Other

## 2019-11-25 ENCOUNTER — Other Ambulatory Visit: Payer: Self-pay | Admitting: Rheumatology

## 2019-11-25 NOTE — Telephone Encounter (Signed)
Last Visit: 09/05/2019 Next Visit: 03/09/2020  Last Fill: 10/27/2019  Okay to refill Tizanidine?

## 2019-11-27 ENCOUNTER — Ambulatory Visit: Payer: Medicare Other | Admitting: Internal Medicine

## 2019-11-27 ENCOUNTER — Encounter (HOSPITAL_COMMUNITY): Payer: Medicare Other

## 2019-12-02 ENCOUNTER — Encounter (HOSPITAL_COMMUNITY): Payer: Medicare Other

## 2019-12-04 ENCOUNTER — Encounter (HOSPITAL_COMMUNITY): Payer: Medicare Other

## 2019-12-09 ENCOUNTER — Encounter (HOSPITAL_COMMUNITY): Payer: Medicare Other

## 2019-12-11 ENCOUNTER — Encounter (HOSPITAL_COMMUNITY): Payer: Medicare Other

## 2019-12-16 ENCOUNTER — Ambulatory Visit: Payer: Medicare Other | Admitting: Internal Medicine

## 2019-12-16 ENCOUNTER — Encounter (HOSPITAL_COMMUNITY): Payer: Medicare Other

## 2019-12-16 ENCOUNTER — Other Ambulatory Visit: Payer: Self-pay

## 2019-12-16 ENCOUNTER — Encounter: Payer: Self-pay | Admitting: Internal Medicine

## 2019-12-16 VITALS — BP 150/80 | HR 84 | Temp 98.1°F | Ht 66.0 in | Wt 172.6 lb

## 2019-12-16 DIAGNOSIS — Z23 Encounter for immunization: Secondary | ICD-10-CM

## 2019-12-16 DIAGNOSIS — J84112 Idiopathic pulmonary fibrosis: Secondary | ICD-10-CM

## 2019-12-16 DIAGNOSIS — Z7185 Encounter for immunization safety counseling: Secondary | ICD-10-CM

## 2019-12-16 DIAGNOSIS — K219 Gastro-esophageal reflux disease without esophagitis: Secondary | ICD-10-CM | POA: Diagnosis not present

## 2019-12-16 DIAGNOSIS — R634 Abnormal weight loss: Secondary | ICD-10-CM

## 2019-12-16 DIAGNOSIS — T50905A Adverse effect of unspecified drugs, medicaments and biological substances, initial encounter: Secondary | ICD-10-CM

## 2019-12-16 DIAGNOSIS — R5383 Other fatigue: Secondary | ICD-10-CM | POA: Diagnosis not present

## 2019-12-16 DIAGNOSIS — Z5181 Encounter for therapeutic drug level monitoring: Secondary | ICD-10-CM

## 2019-12-16 DIAGNOSIS — Z7189 Other specified counseling: Secondary | ICD-10-CM

## 2019-12-16 NOTE — Addendum Note (Signed)
Addended by: Vanessa Barbara on: 12/16/2019 05:05 PM   Modules accepted: Orders

## 2019-12-16 NOTE — Progress Notes (Signed)
OV 11/19/2018: Nichole Reyes is a 69 year old woman referred for evaluation of dyspnea for the past several months.  In the same time interval she is also noticed fatigue.  She has previously undergone a negative cardiac evaluation and an outside CT scan of her chest.  Per report, the CT demonstrates fibrotic changes. She is a never- smoker.  She has previously been evaluated by rheumatology, Dr. Estanislado Pandy, for fibromyalgia and osteoarthritis.  She began to notice dyspnea on exertion when exercising in February 2020.  She had previously been able to walk 35 to 40 minutes, but decreased to about 20 minutes at a time due to dyspnea.  She was hospitalized in [place] in June 2020 for dyspnea. At that time she was COVID negative.  She was discharged on albuterol, which she has been using up to 4 times a day, which improves her symptoms.  There is no family history of fibrotic lung disease; multiple family members had COPD.  Her maternal grandmother had rheumatoid arthritis, but otherwise there is no family history of rheumatologic disease.  In addition to dyspnea on exertion with occasional dyspnea at rest she endorses cough and sputum production.  She denies wheezing, postnasal drip, coughing or choking when eating, Raynaud's, muscle weakness, rashes, dry eyes or mouth, joint swelling, prolonged morning stiffness.  She has occasional hand stiffness throughout the day, not associated with rest.  She has a hiatal hernia with occasional GERD with symptoms less than once a week, associated with certain foods.  She is a never smoker or vaper.  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  She has never worked in [employer] or shipyards.  She does not have any dusty hobbies.  She has a Programmer, systems, but has never had pet birds.  She does not regularly use hot tubs.  She has never had exposure to medical radiation,  methotrexate, Macrobid, amiodarone, bleomycin.  Her only new medication was Zetia last month for hypertriglyceridemia.  Otherwise she has not had medication changes in greater than a year.  Last month she has had 6 episodes of epistaxis, which has improved with use of intranasal steroids prescribed by her PCP.     OV 05/06/2019  Subjective:  Patient ID: Nichole Reyes, female , DOB: August 29, 1950 , age 14 y.o. , MRN: [id number] , ADDRESS: [place] [address]   05/06/2019 -   Chief Complaint  Patient presents with  . Follow-up    Pt states she has both good days and bad days with breathing. Pt states she will occ have to use her rescue inhaler at least 1-2 times with activities.     HPI Nichole Reyes 69 y.o. -referred by Dr. 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  She was first seen by Dr. Carlis Abbott in September 2020 noted above.  She followed up in December 2020 and had pulmonary function test.  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  Due to insurance delays she has not started the nintedanib yet.  She is wondering about this.  She has specific questions about taking Covid vaccine.  She is also worried about her significant cough.  Review shows she is on ACE inhibitor.  She has a tickle in the throat.  She has acid reflux and is on Prilosec.  She has a  history of hiatal hernia.  She is taken a decision on starting nintedanib but she is interested in hearing about the other antifibrotic pirfenidone because of insurance delay she has not started on anything as yet.   [place] [facility] Comprehensive ILD Questionnaire  Symptoms:  -Insidious onset of shortness of breath 7 months ago.  Since it started it has been the same.  She also has episodic amount of dyspnea.  The severity classification is below.  She does not know when her cough started but she does have a cough associated with clearing of the  throat.  It is moderate in intensity.  She does cough at night.  She does bring up phlegm.  The phlegm is usually white but sometimes yellow.  She does feel a tickle in her throat but does not wake up in the middle of the night because of the cough.  There is no wheezing.  Cough is associated with ACE inhibitor intake.  SYMPTOM SCALE - ILD 05/06/2019   O2 use ra  Shortness of Breath 0 -> 5 scale with 5 being worst (score 6 If unable to do)  At rest 4  Simple tasks - showers, clothes change, eating, shaving 4  Household (dishes, doing bed, laundry) 5  Shopping 5  Walking level at own pace 5  Walking up Stairs 5  Total (30-36) Dyspnea Score 28  How bad is your cough? 2.5  How bad is your fatigue  yes  How bad is nausea x  How bad is vomiting?  x  How bad is diarrhea? x  How bad is anxiety? x  How bad is depression x       Past Medical History : Positive for fibromyalgia, hiatal hernia, sleep apnea both for the last few years.  There is also borderline diabetes..  And hyperlipidemia   she denies any asthma or COPD or heart failure rheumatoid arthritis or scleroderma or lupus.  Denies any polymyositis.  Denies Sjogren's.  Denies HIV.  Denies pulmonary hypertension.  Denies thyroid disease or stroke.  Denies seizures or mononucleosis or hepatitis or tuberculosis.  Denies kidney disease.  Denies pneumonia.  Denies history of blood clots or heart disease or pleurisy.  Autoimmune serology sept 2020  - normal Results for STUTI, SANDIN (MRN [id number]) as of 05/06/2019 12:01  Ref. Range 11/19/2018 14:58  SEE BELOW Unknown Comment  Anti Nuclear Antibody (ANA) Latest Ref Range: Negative  Negative  Anti JO-1 Latest Ref Range: 0.0 - 0.9 AI <0.2  CENTROMERE AB SCREEN Latest Ref Range: 0.0 - 0.9 AI <0.2  dsDNA Ab Latest Ref Range: 0 - 9 IU/mL <1  ENA RNP Ab Latest Ref Range: 0.0 - 0.9 AI <0.2  ENA SSA (RO) Ab Latest Ref Range: 0.0 - 0.9 AI <0.2  ENA SSB (LA) Ab Latest Ref Range: 0.0 - 0.9 AI  <0.2  ENA SM Ab Ser-aCnc Latest Ref Range: 0.0 - 0.9 AI <0.2  Chromatin Ab SerPl-aCnc Latest Ref Range: 0.0 - 0.9 AI <0.2  Anti-Jo-1 Ab (RDL) Latest Ref Range: <20 Units <20  Anti-PL-7 Ab (RDL) Unknown CANCELED  Anti-PL-12 Ab (RDL) Unknown CANCELED  Anti-EJ Ab (RDL) Unknown CANCELED  Anti-OJ Ab (RDL) Unknown CANCELED  Anti-SRP Ab (RDL) Unknown CANCELED  Anti-Mi-2 Ab (RDL) Unknown CANCELED  Anti-TIF-1gamma Ab (RDL) Latest Units: Units CANCELED  Anti-MDA-5 Ab (CADM-140)(RDL) Latest Units: Units CANCELED  Anti-NXP-2 (P140) Ab (RDL) Latest Units: Units CANCELED  Anti-SAE1 Ab, IgG (RDL) Latest Units: Units CANCELED  Anti-PM/Scl-100 Ab (RDL) Latest  Ref Range: <20 Units <20  Anti-Ku Ab (RDL) Unknown CANCELED  Anti-SS-A 52kD Ab, IgG (RDL) Latest Ref Range: <20 Units <20  Anti-U1 RNP Ab (RDL) Latest Units: Units CANCELED    ROS: She has fatigue for the last several years and also arthralgia.  She attributes this to fibromyalgia.  She also has pain in her fingers which she attributes due to fibromyalgia.  She has heartburn from hiatal hernia she says.  She also snores because of her sleep apnea and she has a rash related to eczema.  But otherwise negative.   FAMILY HISTORY of LUNG DISEASE: She thinks her dad might have had COPD and her grandfather.  However nobody with pulmonary fibrosis or other lung diseases.   EXPOSURE HISTORY: Denies ever having smoked cigarettes although she grew up in a home with her dad smoked.  Therefore positive for passive smoking as a child.  Denies cigar use.  Denies marijuana use denies cocaine use denies IV drug use.  Denies vaping.   HOME and HOBBY DETAILS : [place]  She is lived in this home for 22 years.  The home is 67 years old.  She has noticed mildew periodically in the shower curtain and in the bathroom.  She does use a CPAP mask but there is no water circuit mold or mildew in it.  She does do gardening with the flower garden  she does use a steam iron but there is no mold or mildew in it.  There is no pet birds.  No feather pillow or duvet.  No pet gerbils.  No mold in the [facility] duct.  Does not play any wind instruments.  Does not use a humidifier.  Does not have a Jacuzzi in the house.   OCCUPATIONAL HISTORY (122 questions) : Essentially negative.  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   PULMONARY TOXICITY HISTORY (27 items): Negative except prednisone use    Results for DOYLE, TEGETHOFF (MRN [id number]) as of 05/06/2019 12:01  Ref. Range 11/19/2018 but ? Done 02/28/2019 14:57  FVC-Pre Latest Units: L 2.29  FVC-%Pred-Pre Latest Units: % 70   Results for MAKENNAH, OMURA (MRN [id number]) as of 05/06/2019 12:01  Ref. Range 11/19/2018 but ? Done 02/28/2019 14:57  DLCO unc Latest Units: ml/min/mmHg 15.75  DLCO unc % pred Latest Units: % 77   Simple office walk 185 feet x  3 laps goal with forehead probe 05/06/2019   O2 used ra  Number laps completed 3  Comments about pace avg  Resting Pulse Ox/HR 98% and 90/min  Final Pulse Ox/HR 96% and 100/min  Desaturated </= 88% no  Desaturated <= 3% points no  Got Tachycardic >/= 90/min yes  Symptoms at end of test Mod dyspnea, knee pain  Miscellaneous comments x   She had a high-resolution CT chest September 24, 2018 at [facility].  This was read by our radiologist Dr Eddie Candle..  I do not have the image to visualized but I trust this report.  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  In addition is described a 1.4 cm right upper lobe posterior segment inferior part nodule that is subpleural and also 8 mm subpleural right middle lobe nodule.  He says these findings are unchanged compared to 2008  and are benign.  OV 06/03/2019 -telephone visit.  Patient identified with 2 person  identifier.  Risks, benefits and limitations of telephone visit explained.  Subjective:  Patient ID: Nichole Reyes, female , DOB: 13-Nov-1950 , age 60 y.o. , MRN: [id number] , ADDRESS: [address] [place] [address]   Clinical IPF diagnosis given dec 2020 and reiterated Mid Feb 2021:  based on your age greater than 65, clubbing on exam, CT scan with high probability for this condition, negative history on your questionnaire and negative blood test on your serology exams   06/03/2019 -  Fu IPF - focus on esbriet monitoring and any other questions she might have   HPI Nichole Reyes 69 y.o. - on Esbriet now. Started it 05/22/2019 Thursday. Currently on 2 pills tid. Taking it with food. Spacing 5-6h between dosing. No side effects at this point.  From a dyspnea standpoint she is stable.  She had questions about being able to do gardening.  We discussed the fact this could cause mold exposure and organic dust exposure that could irritate her IPF.  She says she can do it with masking.  Gardening gives her purpose so I supported it.  She also plan [place] last week and was quite short of breath and took an inhaler.  I have suggested that she use a pulse ox meter and monitor her pulse ox and call us if her pulse ox below 88% so we can order portable oxygen [obviously will have to do stress testing submaximal 6-minute walk test] otherwise doing well.    OV 12/16/2019   Subjective:  Patient ID: Nichole Reyes, female , DOB: May 27, 1950, age 62 y.o. years. , MRN: [id number],  ADDRESS: [place] [address] PCP  Rory Percy, MD Providers : Treatment Team:  Attending Provider: Brand Males, MD   Chief Complaint  Patient presents with  . Follow-up    Tired all the time. redness/itching to bilateral nick and insides of  arms.  stress/heat?     Clinical IPF diagnosis given dec 2020 and reiterated Mid Feb 2021:  based on your age greater than 65, clubbing on exam, CT  scan with high probability for this condition, negative history on your questionnaire and negative blood test on your serology exams . - on Esbriet now. Started it 05/22/2019   GERD/Hiatal Hernia  HPI Nichole Reyes 69 y.o. -returns for follow-up.  Last seen in early part of 2021.  After that she see nurse practitioner 2 times once or at least telephone visit.  She tried it in pulmonary rehabilitation for IPF but she had logistical issues.  Early morning she has to get her husband ready for work.  Late in the afternoon she has to pick up her granddaughter.  She also has knee issues from a fibromyalgia.  Therefore is not able to do pulmonary rehabilitation.  She is very compliant with her pirfenidone.  She feels she is tolerating it fine but she did admit to low appetite and also has a 7 pound weight loss.  She is also fatigued.  She thinks the fatigue is present for the last few months.  She thinks it might be related to her daughter's wedding but then the fatigue has been there for the last few months while the daughter's wedding happened a few weeks ago.  She is not on oxygen.  Walking desaturation test is stable.  Most recent pulmonary function test is stable.  Symptom score other than  fatigue is also stable.  She continues PPI for her acid reflux.    SYMPTOM SCALE - ILD 05/06/2019 179# 12/16/2019 172# - esbriet +  O2 use ra   Shortness of Breath 0 -> 5 scale with 5 being worst (score 6 If unable to do)   At rest 4 1  Simple tasks - showers, clothes change, eating, shaving 4 1  Household (dishes, doing bed, laundry) 5 5  Shopping 5 3  Walking level at own pace 5 3  Walking up Stairs 5 5  Total (30-36) Dyspnea Score 28 15  How bad is your cough? 2.5 1  How bad is your fatigue  yes 4  How bad is nausea x 0  How bad is vomiting?  x 0  How bad is diarrhea? x 0  How bad is anxiety? x 00  How bad is depression x 0      Simple office walk 185 feet x  3 laps goal with forehead probe  05/06/2019  12/16/2019   O2 used ra ra  Number laps completed 3 3  Comments about pace avg mod  Resting Pulse Ox/HR 98% and 90/min 97% and 77/min  Final Pulse Ox/HR 96% and 100/min 97% and 91/mn  Desaturated </= 88% no no  Desaturated <= 3% points no no  Got Tachycardic >/= 90/min yes yes  Symptoms at end of test Mod dyspnea, knee pain Dyspnea and fatigue strting at 1st lap and increased by 3rd  Miscellaneous comments x      PFT Results Latest Ref Rng & Units 08/26/2019 11/19/2018  FVC-Pre L 2.36 2.29  FVC-Predicted Pre % 72 70  FVC-Post L - 2.30  FVC-Predicted Post % - 70  Pre FEV1/FVC % % 88 89  Post FEV1/FCV % % - 90  FEV1-Pre L 2.08 2.04  FEV1-Predicted Pre % 83 82  FEV1-Post L - 2.07  DLCO uncorrected ml/min/mmHg 15.62 15.75  DLCO UNC% % 76 77  DLCO corrected ml/min/mmHg 15.62 -  DLCO COR %Predicted % 76 -  DLVA Predicted % 113 118  TLC L - 3.44  TLC % Predicted % - 66  RV % Predicted % - 50          has a past medical history of Diabetes mellitus without complication ([place]), Fibromyalgia, High cholesterol, Hypertension, Idiopathic pulmonary fibrosis ([place]), and Osteoporosis.   reports that she has never smoked. She has never used smokeless tobacco.  Past Surgical History:  Procedure Laterality Date  . ABDOMINAL HYSTERECTOMY  2007  . CATARACT EXTRACTION Bilateral     Allergies  Allergen Reactions  . Latex   . Pseudoephedrine Other (See Comments)    Rash and hallucinations   . Sulfa Antibiotics   . Sulfonamide Derivatives Rash    Immunization History  Administered Date(s) Administered  . Fluad Quad(high Dose 65+) 01/29/2019  . Influenza Split 12/18/2017  . Influenza-Unspecified 11/18/2013  . PFIZER SARS-COV-2 Vaccination 05/15/2019, 06/13/2019  . Tdap 05/04/1999    Family History  Problem Relation Age of Onset  . Hypertension Mother   . Diabetes Mother   . Osteoporosis Mother   . Hypertension Father   . Heart disease Father   . COPD Father   .  Colon cancer Sister   . Cancer Brother   . Heart disease Brother   . Emphysema Paternal Grandfather      Current Outpatient Medications:  .  ACCU-CHEK AVIVA PLUS test strip, , Disp: , Rfl:  .  Accu-Chek Softclix Lancets lancets, ,  Disp: , Rfl:  .  albuterol (VENTOLIN HFA) 108 (90 Base) MCG/ACT inhaler, 2 puffs every 4 (four) hours as needed. , Disp: , Rfl:  .  aspirin EC 81 MG tablet, Take 81 mg by mouth daily., Disp: , Rfl:  .  Blood Glucose Monitoring Suppl (ACCU-CHEK AVIVA PLUS) w/Device KIT, , Disp: , Rfl:  .  Calcium Carbonate (CALTRATE 600 PO), Take 1 tablet by mouth 2 (two) times a day., Disp: , Rfl:  .  clonazePAM (KLONOPIN) 0.5 MG tablet, Take 0.5 mg by mouth daily. , Disp: , Rfl:  .  ezetimibe (ZETIA) 10 MG tablet, Take 1 tablet (10 mg total) by mouth daily., Disp: 30 tablet, Rfl: 0 .  fenofibrate (TRICOR) 48 MG tablet, Take 48 mg by mouth daily. , Disp: , Rfl:  .  fluticasone (FLOVENT HFA) 110 MCG/ACT inhaler, Inhale 1 puff into the lungs 2 (two) times daily., Disp: 1 Inhaler, Rfl: 12 .  LOSARTAN POTASSIUM PO, Take by mouth., Disp: , Rfl:  .  metFORMIN (GLUCOPHAGE-XR) 500 MG 24 hr tablet, Take 500 mg by mouth daily with breakfast. , Disp: , Rfl:  .  metoprolol succinate (TOPROL-XL) 50 MG 24 hr tablet, Take 50 mg by mouth daily. , Disp: , Rfl:  .  naproxen (NAPROSYN) 500 MG tablet, naproxen 500 mg tablet, Disp: , Rfl:  .  omeprazole (PRILOSEC) 20 MG capsule, Take 1 capsule (20 mg total) by mouth daily., Disp: 30 capsule, Rfl: 11 .  Pirfenidone (ESBRIET) 267 MG TABS, Take 3 tablets by mouth 3 (three) times daily. 801 mg three times daily, Disp: , Rfl:  .  SAVELLA 50 MG TABS tablet, Take 50 mg by mouth 2 (two) times daily. , Disp: , Rfl:  .  tiZANidine (ZANAFLEX) 4 MG tablet, TAKE (1) TABLET EVERY NIGHT AT BEDTIME., Disp: 30 tablet, Rfl: 0 .  triamcinolone cream (KENALOG) 0.1 %, SMARTSIG:1 Application Topical 2-3 Times Daily, Disp: , Rfl:       Objective:   Vitals:   12/16/19  1604  BP: (!) 150/80  Pulse: 84  Temp: 98.1 F (36.7 C)  TempSrc: Temporal  SpO2: 94%  Weight: 172 lb 9.6 oz (78.3 kg)  Height: _0  (1.676 m)    Estimated body mass index is 27.86 kg/m as calculated from the following:   Height as of this encounter: _1  (1.676 m).   Weight as of this encounter: 172 lb 9.6 oz (78.3 kg).  _2 @  [employer]   12/16/19 1604  Weight: 172 lb 9.6 oz (78.3 kg)     Physical Exam  General Appearance:    Alert, cooperative, no distress, appears stated age - yes , Deconditioned looking - no , OBESE  - no, Sitting on Wheelchair -  no  Head:    Normocephalic, without obvious abnormality, atraumatic  Eyes:    PERRL, conjunctiva/corneas clear,  Ears:    Normal TM's and external ear canals, both ears  Nose:   Nares normal, septum midline, mucosa normal, no drainage    or sinus tenderness. OXYGEN ON  - no . Patient is @ ra   Throat:   Lips, mucosa, and tongue normal; teeth and gums normal. Cyanosis on lips - no  Neck:   Supple, symmetrical, trachea midline, no adenopathy;    thyroid:  no enlargement/tenderness/nodules; no carotid   bruit or JVD  Back:     Symmetric, no curvature, ROM normal, no CVA tenderness  Lungs:     Distress - no ,  Wheeze no, Barrell Chest - no, Purse lip breathing - no, Crackles - YES VELCRO at base   Chest Wall:    No tenderness or deformity.    Heart:    Regular rate and rhythm, S1 and S2 normal, no rub   or gallop, Murmur - no  Breast Exam:    NOT DONE  Abdomen:     Soft, non-tender, bowel sounds active all four quadrants,    no masses, no organomegaly. Visceral obesity - yes  Genitalia:   NOT DONE  Rectal:   NOT DONE  Extremities:   Extremities - normal, Has Cane - no, Clubbing - no, Edema - no  Pulses:   2+ and symmetric all extremities  Skin:   Stigmata of Connective Tissue Disease - no  Lymph nodes:   Cervical, supraclavicular, and axillary nodes normal  Psychiatric:  Neurologic:   Pleasant - yes, Anxious  - no, Flat affect - no  CAm-ICU - neg, Alert and Oriented x 3 - yes, Moves all 4s - yes, Speech - normal, Cognition - intact           Assessment:       ICD-10-CM   1. IPF (idiopathic pulmonary fibrosis) ([place])  J84.112   2. Medication monitoring encounter  Z51.81   3. Gastroesophageal reflux disease, unspecified whether esophagitis present  K21.9   4. Fatigue, unspecified type  R53.83   5. Drug-induced weight loss  R63.4    T50.905A   6. Vaccine counseling  Z71.89        Plan:     Patient Instructions  IPF (idiopathic pulmonary fibrosis) (HCC) Medication monitoring encounter Gastroesophageal reflux disease, unspecified whether esophagitis present   -IPF continues to be stable both from a symptom perspective, breathing test perspective and also walking desaturation test perspective  Plan -Continue acid reflux medications -Continue pirfenidone per schedule [side effect issues discussed]  -At this point in time benefit outweighs risk  -Continue space between meals and apply sunscreen when going out -Check liver function test today -Please visit [email or web address] -patient support group for more details about self advocacy -In the future we can discuss role of transplant and/or clinical trials as a care option    Fatigue, unspecified type  -There is a combination of weight related issues, fibromyalgia, pulmonary fibrosis and also pirfenidone  Plan -Pulmonary rehabilitation can help but I understand you limited by schedule issues family issues and also knee issues -From a pulmonary perspective and pirfenidone perspective we will continue to monitor your fatigue  Drug-induced weight loss  -7 pound weight loss but in February 2021 and September 2021 because of pirfenidone -At this point in time is still are technically overweight -Therefore we will leverage pirfenidone and helping you with your weight loss -We will continue to monitor  Vaccine  counseling  -High-dose flu shot today in our clinic December 08, 2019 -You are eligible for the Covid booster vaccine please get it when you can -in the next few days to a few weeks -At some point in the next few months you should also consider shingles vaccine and also pneumonia vaccine  Followup  -4 months do spirometry and DLCO = Return for 30-minute slot to see Dr. Chase Caller in 4 months     SIGNATURE    Dr. Brand Males, M.D., F.C.C.P,  Pulmonary and Critical Care Medicine Staff Physician, [place] Director - Interstitial Lung Disease  Program  Pulmonary [place] [facility] [place], [place],  [place]  Pager: [phone], If no answer or between  15:00h - 7:00h: call [phone] Telephone: [phone]  4:48 PM 12/16/2019

## 2019-12-16 NOTE — Patient Instructions (Addendum)
IPF (idiopathic pulmonary fibrosis) (HCC) Medication monitoring encounter Gastroesophageal reflux disease, unspecified whether esophagitis present   -IPF continues to be stable both from a symptom perspective, breathing test perspective and also walking desaturation test perspective  Plan -Continue acid reflux medications -Continue pirfenidone per schedule [side effect issues discussed]  -At this point in time benefit outweighs risk  -Continue space between meals and apply sunscreen when going out -Check liver function test today -Please visit [email or web address] -patient support group for more details about self advocacy -In the future we can discuss role of transplant and/or clinical trials as a care option    Fatigue, unspecified type  -There is a combination of weight related issues, fibromyalgia, pulmonary fibrosis and also pirfenidone  Plan -Pulmonary rehabilitation can help but I understand you limited by schedule issues family issues and also knee issues -From a pulmonary perspective and pirfenidone perspective we will continue to monitor your fatigue  Drug-induced weight loss  -7 pound weight loss but in February 2021 and September 2021 because of pirfenidone -At this point in time is still are technically overweight -Therefore we will leverage pirfenidone and helping you with your weight loss -We will continue to monitor  Vaccine counseling  -High-dose flu shot today in our clinic December 08, 2019 -You are eligible for the Covid booster vaccine please get it when you can -in the next few days to a few weeks -At some point in the next few months you should also consider shingles vaccine and also pneumonia vaccine  Followup  -4 months do spirometry and DLCO = Return for 30-minute slot to see Dr. Chase Caller in 4 months

## 2019-12-16 NOTE — Addendum Note (Signed)
Addended by: Suzzanne Cloud E on: 12/16/2019 05:10 PM   Modules accepted: Orders

## 2019-12-16 NOTE — Addendum Note (Signed)
Addended by: Vanessa Barbara on: 12/16/2019 05:09 PM   Modules accepted: Orders

## 2019-12-17 LAB — HEPATIC FUNCTION PANEL
ALT: 13 U/L (ref 0–35)
AST: 17 U/L (ref 0–37)
Albumin: 4 g/dL (ref 3.5–5.2)
Alkaline Phosphatase: 76 U/L (ref 39–117)
Bilirubin, Direct: 0 mg/dL (ref 0.0–0.3)
Total Bilirubin: 0.3 mg/dL (ref 0.2–1.2)
Total Protein: 6.8 g/dL (ref 6.0–8.3)

## 2019-12-18 ENCOUNTER — Encounter (HOSPITAL_COMMUNITY): Payer: Medicare Other

## 2019-12-18 DIAGNOSIS — M797 Fibromyalgia: Secondary | ICD-10-CM | POA: Diagnosis not present

## 2019-12-18 DIAGNOSIS — I10 Essential (primary) hypertension: Secondary | ICD-10-CM | POA: Diagnosis not present

## 2019-12-18 DIAGNOSIS — E7849 Other hyperlipidemia: Secondary | ICD-10-CM | POA: Diagnosis not present

## 2019-12-25 ENCOUNTER — Other Ambulatory Visit: Payer: Self-pay | Admitting: Physician Assistant

## 2019-12-25 NOTE — Telephone Encounter (Signed)
Last Visit: 09/05/2019 Next Visit:03/09/2020  Last Fill: 11/25/2019  Okay to refill Tizanidine?

## 2020-01-17 DIAGNOSIS — J8410 Pulmonary fibrosis, unspecified: Secondary | ICD-10-CM | POA: Diagnosis not present

## 2020-01-17 DIAGNOSIS — E7849 Other hyperlipidemia: Secondary | ICD-10-CM | POA: Diagnosis not present

## 2020-01-17 DIAGNOSIS — I10 Essential (primary) hypertension: Secondary | ICD-10-CM | POA: Diagnosis not present

## 2020-01-26 ENCOUNTER — Other Ambulatory Visit: Payer: Self-pay | Admitting: Physician Assistant

## 2020-01-26 NOTE — Telephone Encounter (Signed)
Last Visit: 09/05/2019 Next Visit:03/09/2020  Last Fill: 12/25/2019  Okay to refill Tizanidine?

## 2020-01-28 DIAGNOSIS — R739 Hyperglycemia, unspecified: Secondary | ICD-10-CM | POA: Diagnosis not present

## 2020-01-28 DIAGNOSIS — E7800 Pure hypercholesterolemia, unspecified: Secondary | ICD-10-CM | POA: Diagnosis not present

## 2020-01-28 DIAGNOSIS — I10 Essential (primary) hypertension: Secondary | ICD-10-CM | POA: Diagnosis not present

## 2020-01-28 DIAGNOSIS — J8410 Pulmonary fibrosis, unspecified: Secondary | ICD-10-CM | POA: Diagnosis not present

## 2020-02-03 DIAGNOSIS — G720 Drug-induced myopathy: Secondary | ICD-10-CM | POA: Diagnosis not present

## 2020-02-03 DIAGNOSIS — M797 Fibromyalgia: Secondary | ICD-10-CM | POA: Diagnosis not present

## 2020-02-03 DIAGNOSIS — M4000 Postural kyphosis, site unspecified: Secondary | ICD-10-CM | POA: Diagnosis not present

## 2020-02-03 DIAGNOSIS — G4730 Sleep apnea, unspecified: Secondary | ICD-10-CM | POA: Diagnosis not present

## 2020-02-03 DIAGNOSIS — Z1389 Encounter for screening for other disorder: Secondary | ICD-10-CM | POA: Diagnosis not present

## 2020-02-03 DIAGNOSIS — E7800 Pure hypercholesterolemia, unspecified: Secondary | ICD-10-CM | POA: Diagnosis not present

## 2020-02-05 ENCOUNTER — Telehealth: Payer: Self-pay | Admitting: Internal Medicine

## 2020-02-05 NOTE — Telephone Encounter (Signed)
Spoke with Wells Guiles with [facility] She states that the pt told them she has been having a metallic taste in her mouth since starting Esbriet  She has not reported any other symptoms  Will forward to MR to see if he has any recs, thanks

## 2020-02-06 NOTE — Telephone Encounter (Signed)
Yes that is from esbriet. If she can manage with it - continue esbriet. IF not, let us know. How bad is it on scale of 1-10. 10 being wost. ? I can now only give reply back after weekend

## 2020-02-06 NOTE — Telephone Encounter (Signed)
Tried calling the pt and there was no answer- LMTCB.  

## 2020-02-06 NOTE — Telephone Encounter (Signed)
Pt is returning call from office she was told to call back ask for someone in Triage . Please advise

## 2020-02-06 NOTE — Telephone Encounter (Signed)
Spoke with patient. She stated that the taste in her opinion is a level 8.

## 2020-02-11 NOTE — Telephone Encounter (Signed)
That is pretty bad metallic taste because of pirfenidone.  Currently she is on full dose pirfenidone.  Plan -Give 10-day holiday with pirfenidone -this can help her with Thanksgiving -Then restart at 1 pill 3 times daily for 2 weeks followed by 2 pills 3 times daily for 2 weeks and then go back to full dose of 3 pills 3 times daily

## 2020-02-11 NOTE — Telephone Encounter (Signed)
Spoke with pt and advised of Esbriet instructions per Dr Chase Caller. Pt verbalized understanding. Nothing further needed.

## 2020-02-17 DIAGNOSIS — J8410 Pulmonary fibrosis, unspecified: Secondary | ICD-10-CM | POA: Diagnosis not present

## 2020-02-17 DIAGNOSIS — M797 Fibromyalgia: Secondary | ICD-10-CM | POA: Diagnosis not present

## 2020-02-17 DIAGNOSIS — E7849 Other hyperlipidemia: Secondary | ICD-10-CM | POA: Diagnosis not present

## 2020-02-17 DIAGNOSIS — I10 Essential (primary) hypertension: Secondary | ICD-10-CM | POA: Diagnosis not present

## 2020-02-23 DIAGNOSIS — J8410 Pulmonary fibrosis, unspecified: Secondary | ICD-10-CM | POA: Diagnosis not present

## 2020-02-24 NOTE — Progress Notes (Signed)
Office Visit Note  Patient: Nichole Reyes             Date of Birth: November 09, 1950           MRN: [id number]             PCP: Rory Percy, MD Referring: Rory Percy, MD Visit Date: 03/09/2020 Occupation: @GUAROCC @  Subjective:  Pain  Right Knee (Has continued to have pain since her last appointment. ) and Pain of the Right Hip   History of Present Illness: AMRAN MALTER is a 69 y.o. female with history of osteoarthritis, degenerative disc disease and fibromyalgia.  She states her right knee joint has been painful since June.  She continues to have discomfort walking.  We did x-rays and June which were unremarkable except for mild osteoarthritis.  She also complains of some discomfort in the right hip.  She continues to have some lower back pain.  Fibromyalgia is a stable.  Activities of Daily Living:  Patient reports morning stiffness for 15 minutes.   Patient Denies nocturnal pain.  Difficulty dressing/grooming: Denies Difficulty climbing stairs: Denies Difficulty getting out of chair: Reports Difficulty using hands for taps, buttons, cutlery, and/or writing: Denies  Review of Systems  Constitutional: Negative for fatigue.  HENT: Negative for mouth dryness.   Eyes: Negative for dryness.  Respiratory: Positive for shortness of breath.   Cardiovascular: Negative for swelling in legs/feet.  Gastrointestinal: Negative for constipation.  Endocrine: Positive for increased urination.  Genitourinary: Negative for difficulty urinating.  Musculoskeletal: Positive for morning stiffness.  Skin: Positive for rash.  Allergic/Immunologic: Negative for susceptible to infections.  Neurological: Negative for numbness.  Hematological: Negative for bruising/bleeding tendency.  Psychiatric/Behavioral: Negative for sleep disturbance.    PMFS History:  Patient Active Problem List   Diagnosis Date Noted  . ILD (interstitial lung disease) ([place]) 08/26/2019  . Fibromyalgia syndrome  06/26/2016  . Other fatigue 06/26/2016  . Other insomnia 06/26/2016  . Arthralgia of both knees 06/26/2016  . Pain in both feet 06/26/2016  . Primary osteoarthritis of both feet 06/26/2016  . Primary osteoarthritis of both knees 06/26/2016  . DDD (degenerative disc disease), cervical 06/26/2016  . 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 DDD (degenerative disc disease), lumbar 06/26/2016  . History of hypertension 06/26/2016  . History of high cholesterol 06/26/2016  . Other sleep apnea 06/26/2016  . History of osteopenia 06/26/2016  . 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 THYROID NODULE, LEFT 01/03/2008  . HYPERLIPIDEMIA 01/03/2008  . DEPRESSION 01/03/2008  . MIGRAINE HEADACHE 01/03/2008  . ALLERGIC RHINITIS 01/03/2008  . ALOPECIA 01/03/2008  . ALLERGIC ARTHRITIS SITE UNSPECIFIED 01/03/2008    Past Medical History:  Diagnosis Date  . Diabetes mellitus without complication ([place])   . Fibromyalgia   . High cholesterol   . Hypertension   . 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 Osteoporosis     Family History  Problem Relation Age of Onset  . Hypertension Mother   . Diabetes Mother   . Osteoporosis Mother   . Hypertension Father   . Heart disease Father   . COPD Father   . Colon cancer Sister   . Cancer Brother   . Heart disease Brother   . Emphysema Paternal Grandfather   . Heart disease Brother    Past Surgical History:  Procedure Laterality Date  . ABDOMINAL HYSTERECTOMY  2007  . CATARACT EXTRACTION Bilateral    Social History   Social History Narrative  .  Not on file   Immunization History  Administered Date(s) Administered  . Fluad Quad(high Dose 65+) 01/29/2019, 12/16/2019  . Influenza Split 12/18/2017  . Influenza-Unspecified 11/18/2013  . PFIZER SARS-COV-2 Vaccination 05/15/2019, 06/13/2019  . Tdap 05/04/1999     Objective: Vital Signs: BP 137/83 (BP Location: Left Arm, Patient Position: Sitting, Cuff Size: Small)    Pulse 86   Resp 12   Ht 5\' 5"  (1.651 m)   Wt 172 lb (78 kg)   BMI 28.62 kg/m    Physical Exam Vitals and nursing note reviewed.  Constitutional:      Appearance: She is well-developed and well-nourished.  HENT:     Head: Normocephalic and atraumatic.  Eyes:     Extraocular Movements: EOM normal.     Conjunctiva/sclera: Conjunctivae normal.  Cardiovascular:     Rate and Rhythm: Normal rate and regular rhythm.     Pulses: Intact distal pulses.     Heart sounds: Normal heart sounds.  Pulmonary:     Effort: Pulmonary effort is normal.     Breath sounds: Normal breath sounds.  Abdominal:     General: Bowel sounds are normal.     Palpations: Abdomen is soft.  Musculoskeletal:     Cervical back: Normal range of motion.  Lymphadenopathy:     Cervical: No cervical adenopathy.  Skin:    General: Skin is warm and dry.     Capillary Refill: Capillary refill takes less than 2 seconds.  Neurological:     Mental Status: She is alert and oriented to person, place, and time.  Psychiatric:        Mood and Affect: Mood and affect normal.        Behavior: Behavior normal.      Musculoskeletal Exam: C-spine was in good range of motion.  Shoulder joints, elbow joints, wrist joints with good range of motion.  She has bilateral PIP and DIP thickening.  Hip joints, knee joints, ankles with good range of motion.  She had no warmth swelling or effusion in her right knee joint with discomfort range of motion.  There was no tenderness across MTPs.  .  CDAI Exam: CDAI Score: -- Patient Global: --; Provider Global: -- Swollen: --; Tender: -- Joint Exam 03/09/2020   No joint exam has been documented for this visit   There is currently no information documented on the homunculus. Go to the Rheumatology activity and complete the homunculus joint exam.  Investigation: No additional findings.  Imaging: No results found.  Recent Labs: Lab Results  Component Value Date   WBC 10.5 03/15/2017    HGB 14.6 03/15/2017   PLT 439 (H) 03/15/2017   NA 140 03/15/2017   K 4.8 03/15/2017   CL 103 03/15/2017   CO2 31 03/15/2017   GLUCOSE 104 (H) 03/15/2017   BUN 12 03/15/2017   CREATININE 0.73 03/15/2017   BILITOT 0.3 12/16/2019   ALKPHOS 76 12/16/2019   AST 17 12/16/2019   ALT 13 12/16/2019   PROT 6.8 12/16/2019   ALBUMIN 4.0 12/16/2019   CALCIUM 10.0 03/15/2017   GFRAA 101 03/15/2017    Speciality Comments: No specialty comments available.  Procedures:  Large Joint Inj: R knee on 03/09/2020 2:05 PM Indications: pain Details: 27 G 1.5 in needle, medial approach  Arthrogram: No  Medications: 40 mg triamcinolone acetonide 40 MG/ML; 1.5 mL lidocaine 1 % Aspirate: 0 mL Outcome: tolerated well, no immediate complications Procedure, treatment alternatives, risks and benefits explained, specific risks discussed. Consent was  given by the patient. Immediately prior to procedure a time out was called to verify the correct patient, procedure, equipment, support staff and site/side marked as required. Patient was prepped and draped in the usual sterile fashion.     Allergies: Latex, Pseudoephedrine, Sulfa antibiotics, and Sulfonamide derivatives   Assessment / Plan:     Visit Diagnoses: Acute pain of right knee-she complains of pain and discomfort in her right knee joint.  I reviewed the x-ray from June 2021 which was unremarkable except for some mild osteoarthritis.  There was no warmth swelling or effusion in her knee joint.  Per her request right knee joint was injected with cortisone as described above.  She tolerated the procedure well.  Primary osteoarthritis of both knees-she was advised to do knee joint exercises.  A handout on knee joint exercises was given.  Chronic left shoulder pain - XRs unremarkable on 08/22/18.  She had a left glenohumeral joint cortisone injection on 08/22/18, which provided temporary relief.  Her shoulder joint pain has improved.  Primary osteoarthritis  of both feet-proper fitting shoes were discussed.  DDD (degenerative disc disease), cervical-she had good range of motion of her cervical spine.  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  Currently not having much discomfort.  DDD (degenerative disc disease), lumbar-she continues to have some lower back pain which is manageable.  Fibromyalgia -she states her fibromyalgia symptoms still persist but manageable with tizanidine   Other insomnia-good sleep hygiene was discussed.  Other fatigue-related to fibromyalgia and insomnia.  Other medical problems are listed as follows:  History of migraine  History of hypertension  History of depression  History of hypercholesterolemia  History of sleep apnea  History of hyperlipidemia  Orders: Orders Placed This Encounter  Procedures  . Large Joint Inj   No orders of the defined types were placed in this encounter.     Follow-Up Instructions: Return in about 6 months (around 09/07/2020) for Osteoarthritis.   Bo Merino, MD  Note - This record has been created using Editor, commissioning.  Chart creation errors have been sought, but may not always  have been located. Such creation errors do not reflect on  the standard of medical care.

## 2020-02-26 ENCOUNTER — Other Ambulatory Visit: Payer: Self-pay | Admitting: Physician Assistant

## 2020-02-26 NOTE — Telephone Encounter (Signed)
Last Visit: 09/05/2019 Next Visit: 03/09/2020  Current Dose per office note on 09/05/2019: dose not specified.  DX: Fibromyalgia  Okay to refill tizanidine?

## 2020-03-03 ENCOUNTER — Other Ambulatory Visit: Payer: Self-pay | Admitting: Critical Care Medicine

## 2020-03-03 DIAGNOSIS — J849 Interstitial pulmonary disease, unspecified: Secondary | ICD-10-CM

## 2020-03-03 DIAGNOSIS — R0609 Other forms of dyspnea: Secondary | ICD-10-CM

## 2020-03-09 ENCOUNTER — Encounter: Payer: Self-pay | Admitting: Rheumatology

## 2020-03-09 ENCOUNTER — Ambulatory Visit: Payer: Medicare Other | Admitting: Rheumatology

## 2020-03-09 ENCOUNTER — Other Ambulatory Visit: Payer: Self-pay

## 2020-03-09 VITALS — BP 137/83 | HR 86 | Resp 12 | Ht 65.0 in | Wt 172.0 lb

## 2020-03-09 DIAGNOSIS — Z8659 Personal history of other mental and behavioral disorders: Secondary | ICD-10-CM

## 2020-03-09 DIAGNOSIS — G4709 Other insomnia: Secondary | ICD-10-CM

## 2020-03-09 DIAGNOSIS — M25512 Pain in left shoulder: Secondary | ICD-10-CM | POA: Diagnosis not present

## 2020-03-09 DIAGNOSIS — R5383 Other fatigue: Secondary | ICD-10-CM

## 2020-03-09 DIAGNOSIS — M797 Fibromyalgia: Secondary | ICD-10-CM

## 2020-03-09 DIAGNOSIS — G8929 Other chronic pain: Secondary | ICD-10-CM

## 2020-03-09 DIAGNOSIS — M5136 Other intervertebral disc degeneration, lumbar region: Secondary | ICD-10-CM

## 2020-03-09 DIAGNOSIS — Z8679 Personal history of other diseases of the circulatory system: Secondary | ICD-10-CM

## 2020-03-09 DIAGNOSIS — M51369 Other intervertebral disc degeneration, lumbar region without mention of lumbar back pain or lower extremity pain: Secondary | ICD-10-CM

## 2020-03-09 DIAGNOSIS — M5134 Other intervertebral disc degeneration, thoracic region: Secondary | ICD-10-CM

## 2020-03-09 DIAGNOSIS — M19071 Primary osteoarthritis, right ankle and foot: Secondary | ICD-10-CM | POA: Diagnosis not present

## 2020-03-09 DIAGNOSIS — M25561 Pain in right knee: Secondary | ICD-10-CM | POA: Diagnosis not present

## 2020-03-09 DIAGNOSIS — Z8639 Personal history of other endocrine, nutritional and metabolic disease: Secondary | ICD-10-CM

## 2020-03-09 DIAGNOSIS — Z8669 Personal history of other diseases of the nervous system and sense organs: Secondary | ICD-10-CM

## 2020-03-09 DIAGNOSIS — M170 Bilateral primary osteoarthritis of knee: Secondary | ICD-10-CM | POA: Diagnosis not present

## 2020-03-09 DIAGNOSIS — M19072 Primary osteoarthritis, left ankle and foot: Secondary | ICD-10-CM

## 2020-03-09 DIAGNOSIS — M5030 Other cervical disc degeneration, unspecified cervical region: Secondary | ICD-10-CM

## 2020-03-09 MED ORDER — LIDOCAINE HCL 1 % IJ SOLN
1.5000 mL | INTRAMUSCULAR | Status: AC | PRN
  Administered 2020-03-09: 1.5 mL

## 2020-03-09 MED ORDER — TRIAMCINOLONE ACETONIDE 40 MG/ML IJ SUSP
40.0000 mg | INTRAMUSCULAR | Status: AC | PRN
  Administered 2020-03-09: 40 mg via INTRA_ARTICULAR

## 2020-03-09 NOTE — Patient Instructions (Signed)
Journal for Nurse Practitioners, 15(4), 263-267. Retrieved December 24, 2017 from http://clinicalkey.com/nursing">  Knee Exercises Ask your health care provider which exercises are safe for you. Do exercises exactly as told by your health care provider and adjust them as directed. It is normal to feel mild stretching, pulling, tightness, or discomfort as you do these exercises. Stop right away if you feel sudden pain or your pain gets worse. Do not begin these exercises until told by your health care provider. Stretching and range-of-motion exercises These exercises warm up your muscles and joints and improve the movement and flexibility of your knee. These exercises also help to relieve pain and swelling. Knee extension, prone 1. Lie on your abdomen (prone position) on a bed. 2. Place your left / right knee just beyond the edge of the surface so your knee is not on the bed. You can put a towel under your left / right thigh just above your kneecap for comfort. 3. Relax your leg muscles and allow gravity to straighten your knee (extension). You should feel a stretch behind your left / right knee. 4. Hold this position for __________ seconds. 5. Scoot up so your knee is supported between repetitions. Repeat __________ times. Complete this exercise __________ times a day. Knee flexion, active  1. Lie on your back with both legs straight. If this causes back discomfort, bend your left / right knee so your foot is flat on the floor. 2. Slowly slide your left / right heel back toward your buttocks. Stop when you feel a gentle stretch in the front of your knee or thigh (flexion). 3. Hold this position for __________ seconds. 4. Slowly slide your left / right heel back to the starting position. Repeat __________ times. Complete this exercise __________ times a day. Quadriceps stretch, prone  1. Lie on your abdomen on a firm surface, such as a bed or padded floor. 2. Bend your left / right knee and hold  your ankle. If you cannot reach your ankle or pant leg, loop a belt around your foot and grab the belt instead. 3. Gently pull your heel toward your buttocks. Your knee should not slide out to the side. You should feel a stretch in the front of your thigh and knee (quadriceps). 4. Hold this position for __________ seconds. Repeat __________ times. Complete this exercise __________ times a day. Hamstring, supine 1. Lie on your back (supine position). 2. Loop a belt or towel over the ball of your left / right foot. The ball of your foot is on the walking surface, right under your toes. 3. Straighten your left / right knee and slowly pull on the belt to raise your leg until you feel a gentle stretch behind your knee (hamstring). ? Do not let your knee bend while you do this. ? Keep your other leg flat on the floor. 4. Hold this position for __________ seconds. Repeat __________ times. Complete this exercise __________ times a day. Strengthening exercises These exercises build strength and endurance in your knee. Endurance is the ability to use your muscles for a long time, even after they get tired. Quadriceps, isometric This exercise stretches the muscles in front of your thigh (quadriceps) without moving your knee joint (isometric). 1. Lie on your back with your left / right leg extended and your other knee bent. Put a rolled towel or small pillow under your knee if told by your health care provider. 2. Slowly tense the muscles in the front of your left /   right thigh. You should see your kneecap slide up toward your hip or see increased dimpling just above the knee. This motion will push the back of the knee toward the floor. 3. For __________ seconds, hold the muscle as tight as you can without increasing your pain. 4. Relax the muscles slowly and completely. Repeat __________ times. Complete this exercise __________ times a day. Straight leg raises This exercise stretches the muscles in front  of your thigh (quadriceps) and the muscles that move your hips (hip flexors). 1. Lie on your back with your left / right leg extended and your other knee bent. 2. Tense the muscles in the front of your left / right thigh. You should see your kneecap slide up or see increased dimpling just above the knee. Your thigh may even shake a bit. 3. Keep these muscles tight as you raise your leg 4-6 inches (10-15 cm) off the floor. Do not let your knee bend. 4. Hold this position for __________ seconds. 5. Keep these muscles tense as you lower your leg. 6. Relax your muscles slowly and completely after each repetition. Repeat __________ times. Complete this exercise __________ times a day. Hamstring, isometric 1. Lie on your back on a firm surface. 2. Bend your left / right knee about __________ degrees. 3. Dig your left / right heel into the surface as if you are trying to pull it toward your buttocks. Tighten the muscles in the back of your thighs (hamstring) to "dig" as hard as you can without increasing any pain. 4. Hold this position for __________ seconds. 5. Release the tension gradually and allow your muscles to relax completely for __________ seconds after each repetition. Repeat __________ times. Complete this exercise __________ times a day. Hamstring curls If told by your health care provider, do this exercise while wearing ankle weights. Begin with __________ lb weights. Then increase the weight by 1 lb (0.5 kg) increments. Do not wear ankle weights that are more than __________ lb. 1. Lie on your abdomen with your legs straight. 2. Bend your left / right knee as far as you can without feeling pain. Keep your hips flat against the floor. 3. Hold this position for __________ seconds. 4. Slowly lower your leg to the starting position. Repeat __________ times. Complete this exercise __________ times a day. Squats This exercise strengthens the muscles in front of your thigh and knee  (quadriceps). 1. Stand in front of a table, with your feet and knees pointing straight ahead. You may rest your hands on the table for balance but not for support. 2. Slowly bend your knees and lower your hips like you are going to sit in a chair. ? Keep your weight over your heels, not over your toes. ? Keep your lower legs upright so they are parallel with the table legs. ? Do not let your hips go lower than your knees. ? Do not bend lower than told by your health care provider. ? If your knee pain increases, do not bend as low. 3. Hold the squat position for __________ seconds. 4. Slowly push with your legs to return to standing. Do not use your hands to pull yourself to standing. Repeat __________ times. Complete this exercise __________ times a day. Wall slides This exercise strengthens the muscles in front of your thigh and knee (quadriceps). 1. Lean your back against a smooth wall or door, and walk your feet out 18-24 inches (46-61 cm) from it. 2. Place your feet hip-width apart. 3.   Slowly slide down the wall or door until your knees bend __________ degrees. Keep your knees over your heels, not over your toes. Keep your knees in line with your hips. 4. Hold this position for __________ seconds. Repeat __________ times. Complete this exercise __________ times a day. Straight leg raises This exercise strengthens the muscles that rotate the leg at the hip and move it away from your body (hip abductors). 1. Lie on your side with your left / right leg in the top position. Lie so your head, shoulder, knee, and hip line up. You may bend your bottom knee to help you keep your balance. 2. Roll your hips slightly forward so your hips are stacked directly over each other and your left / right knee is facing forward. 3. Leading with your heel, lift your top leg 4-6 inches (10-15 cm). You should feel the muscles in your outer hip lifting. ? Do not let your foot drift forward. ? Do not let your knee  roll toward the ceiling. 4. Hold this position for __________ seconds. 5. Slowly return your leg to the starting position. 6. Let your muscles relax completely after each repetition. Repeat __________ times. Complete this exercise __________ times a day. Straight leg raises This exercise stretches the muscles that move your hips away from the front of the pelvis (hip extensors). 1. Lie on your abdomen on a firm surface. You can put a pillow under your hips if that is more comfortable. 2. Tense the muscles in your buttocks and lift your left / right leg about 4-6 inches (10-15 cm). Keep your knee straight as you lift your leg. 3. Hold this position for __________ seconds. 4. Slowly lower your leg to the starting position. 5. Let your leg relax completely after each repetition. Repeat __________ times. Complete this exercise __________ times a day. This information is not intended to replace advice given to you by your health care provider. Make sure you discuss any questions you have with your health care provider. Document Revised: 12/25/2017 Document Reviewed: 12/25/2017 Elsevier Patient Education  2020 [employer]  

## 2020-03-15 ENCOUNTER — Other Ambulatory Visit: Payer: Self-pay | Admitting: Critical Care Medicine

## 2020-03-15 DIAGNOSIS — R0609 Other forms of dyspnea: Secondary | ICD-10-CM

## 2020-03-15 DIAGNOSIS — K219 Gastro-esophageal reflux disease without esophagitis: Secondary | ICD-10-CM

## 2020-03-15 DIAGNOSIS — J849 Interstitial pulmonary disease, unspecified: Secondary | ICD-10-CM

## 2020-03-24 ENCOUNTER — Telehealth: Payer: Self-pay | Admitting: Pharmacy Technician

## 2020-03-24 NOTE — Telephone Encounter (Signed)
Received fax from [employer] that patient has exhausted her [employer] for [employer] and is eligible to apply for their foundation.   Completed foundation form and will place in Md box for signature.

## 2020-03-25 ENCOUNTER — Other Ambulatory Visit: Payer: Self-pay | Admitting: Physician Assistant

## 2020-03-25 NOTE — Telephone Encounter (Signed)
Last Visit: 03/09/2020 Next Visit: 09/07/2020  Current Dose per office note on 03/09/2020, Zanaflex not discussed  Dx: Acute pain of right knee  Okay to refill Zanaflex?

## 2020-03-31 NOTE — Telephone Encounter (Signed)
Received signed [employer] from Provider. Submitted Patient Assistance Application to [place] for [employer] along with provider portion. Will update patient when we receive a response.  Phone# [phone]

## 2020-04-04 ENCOUNTER — Other Ambulatory Visit: Payer: Self-pay | Admitting: Internal Medicine

## 2020-04-07 NOTE — Telephone Encounter (Signed)
Currently in benefits review process.

## 2020-04-12 NOTE — Telephone Encounter (Signed)
Received a fax from  [place] regarding an approval for Nichole Reyes patient assistance from 04/09/20 until further notice.   Phone number: [phone]  Patient will receive medication from [facility]- Phone# [phone]

## 2020-04-17 DIAGNOSIS — I10 Essential (primary) hypertension: Secondary | ICD-10-CM | POA: Diagnosis not present

## 2020-04-17 DIAGNOSIS — E7849 Other hyperlipidemia: Secondary | ICD-10-CM | POA: Diagnosis not present

## 2020-04-17 DIAGNOSIS — J8410 Pulmonary fibrosis, unspecified: Secondary | ICD-10-CM | POA: Diagnosis not present

## 2020-04-17 DIAGNOSIS — M797 Fibromyalgia: Secondary | ICD-10-CM | POA: Diagnosis not present

## 2020-04-26 ENCOUNTER — Other Ambulatory Visit: Payer: Self-pay | Admitting: Physician Assistant

## 2020-04-26 NOTE — Telephone Encounter (Signed)
Last Visit: 03/09/2020 Next Visit: 09/07/2020  Current Dose per office note on 03/09/2020, Zanaflex not discussed  Dx: Acute pain of right knee  Last Fill: 03/25/2020  Okay to refill Zanaflex?

## 2020-05-04 ENCOUNTER — Ambulatory Visit: Payer: Medicare Other | Admitting: Internal Medicine

## 2020-05-06 ENCOUNTER — Other Ambulatory Visit: Payer: Self-pay

## 2020-05-06 ENCOUNTER — Ambulatory Visit: Payer: Medicare Other | Admitting: Internal Medicine

## 2020-05-06 ENCOUNTER — Encounter: Payer: Self-pay | Admitting: Internal Medicine

## 2020-05-06 VITALS — BP 112/62 | HR 90 | Temp 97.0°F | Ht 65.5 in | Wt 169.0 lb

## 2020-05-06 DIAGNOSIS — J849 Interstitial pulmonary disease, unspecified: Secondary | ICD-10-CM | POA: Diagnosis not present

## 2020-05-06 NOTE — Progress Notes (Signed)
OV 11/19/2018: [name] is a 70 year old woman referred for evaluation of dyspnea for the past several months.  In the same time interval she is also noticed fatigue.  She has previously undergone a negative cardiac evaluation and an outside CT scan of her chest.  Per report, the CT demonstrates fibrotic changes. She is a never- smoker.  She has previously been evaluated by rheumatology, Dr. Estanislado Reyes, for fibromyalgia and osteoarthritis.  She began to notice dyspnea on exertion when exercising in February 2020.  She had previously been able to walk 35 to 40 minutes, but decreased to about 20 minutes at a time due to dyspnea.  She was hospitalized in [place] in June 2020 for dyspnea. At that time she was COVID negative.  She was discharged on albuterol, which she has been using up to 4 times a day, which improves her symptoms.  There is no family history of fibrotic lung disease; multiple family members had COPD.  Her maternal grandmother had rheumatoid arthritis, but otherwise there is no family history of rheumatologic disease.  In addition to dyspnea on exertion with occasional dyspnea at rest she endorses cough and sputum production.  She denies wheezing, postnasal drip, coughing or choking when eating, Raynaud's, muscle weakness, rashes, dry eyes or mouth, joint swelling, prolonged morning stiffness.  She has occasional hand stiffness throughout the day, not associated with rest.  She has a hiatal hernia with occasional GERD with symptoms less than once a week, associated with certain foods.  She is a never smoker or vaper.  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  She has never worked in [employer] or shipyards.  She does not have any dusty hobbies.  She has a Programmer, systems, but has never had pet birds.  She does not regularly use hot tubs.  She has never had exposure to medical radiation, methotrexate,  Macrobid, amiodarone, bleomycin.  Her only new medication was Zetia last month for hypertriglyceridemia.  Otherwise she has not had medication changes in greater than a year.  Last month she has had 6 episodes of epistaxis, which has improved with use of intranasal steroids prescribed by her PCP.     OV 05/06/2019  Subjective:  Patient ID: Nichole Reyes, female , DOB: [birth year] , age 70 y.o. , MRN: [id number] , ADDRESS: [place] [id number]   05/06/2019 -   Chief Complaint  Patient presents with  . Follow-up    Pt states she has both good days and bad days with breathing. Pt states she will occ have to use her rescue inhaler at least 1-2 times with activities.     HPI Nichole Reyes 70 y.o. -referred by Dr. 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  She was first seen by Dr. Carlis Abbott in September 2020 noted above.  She followed up in December 2020 and had pulmonary function test.  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  Due to insurance delays she has not started the nintedanib yet.  She is wondering about this.  She has specific questions about taking Covid vaccine.  She is also worried about her significant cough.  Review shows she is on ACE inhibitor.  She has a tickle in the throat.  She has acid reflux and is on Prilosec.  She has a history  of hiatal hernia.  She is taken a decision on starting nintedanib but she is interested in hearing about the other antifibrotic pirfenidone because of insurance delay she has not started on anything as yet.   [place] Integrated Comprehensive ILD Questionnaire  Symptoms:  -Insidious onset of shortness of breath 7 months ago.  Since it started it has been the same.  She also has episodic amount of dyspnea.  The severity classification is below.  She does not know when her cough started but she does have a cough associated with clearing of the throat.  It is  moderate in intensity.  She does cough at night.  She does bring up phlegm.  The phlegm is usually white but sometimes yellow.  She does feel a tickle in her throat but does not wake up in the middle of the night because of the cough.  There is no wheezing.  Cough is associated with ACE inhibitor intake.  SYMPTOM SCALE - ILD 05/06/2019   O2 use ra  Shortness of Breath 0 -> 5 scale with 5 being worst (score 6 If unable to do)  At rest 4  Simple tasks - showers, clothes change, eating, shaving 4  Household (dishes, doing bed, laundry) 5  Shopping 5  Walking level at own pace 5  Walking up Stairs 5  Total (30-36) Dyspnea Score 28  How bad is your cough? 2.5  How bad is your fatigue  yes  How bad is nausea x  How bad is vomiting?  x  How bad is diarrhea? x  How bad is anxiety? x  How bad is depression x       Past Medical History : Positive for fibromyalgia, hiatal hernia, sleep apnea both for the last few years.  There is also borderline diabetes..  And hyperlipidemia   she denies any asthma or COPD or heart failure rheumatoid arthritis or scleroderma or lupus.  Denies any polymyositis.  Denies Sjogren's.  Denies HIV.  Denies pulmonary hypertension.  Denies thyroid disease or stroke.  Denies seizures or mononucleosis or hepatitis or tuberculosis.  Denies kidney disease.  Denies pneumonia.  Denies history of blood clots or heart disease or pleurisy.  Autoimmune serology sept 2020  - normal Results for Nichole, Reyes (MRN [id number]) as of 05/06/2019 12:01  Ref. Range 11/19/2018 14:58  SEE BELOW Unknown Comment  Anti Nuclear Antibody (ANA) Latest Ref Range: Negative  Negative  Anti JO-1 Latest Ref Range: 0.0 - 0.9 AI <0.2  CENTROMERE AB SCREEN Latest Ref Range: 0.0 - 0.9 AI <0.2  dsDNA Ab Latest Ref Range: 0 - 9 IU/mL <1  ENA RNP Ab Latest Ref Range: 0.0 - 0.9 AI <0.2  ENA SSA (RO) Ab Latest Ref Range: 0.0 - 0.9 AI <0.2  ENA SSB (LA) Ab Latest Ref Range: 0.0 - 0.9 AI <0.2  ENA SM  Ab Ser-aCnc Latest Ref Range: 0.0 - 0.9 AI <0.2  Chromatin Ab SerPl-aCnc Latest Ref Range: 0.0 - 0.9 AI <0.2  Anti-Jo-1 Ab (RDL) Latest Ref Range: <20 Units <20  Anti-PL-7 Ab (RDL) Unknown CANCELED  Anti-PL-12 Ab (RDL) Unknown CANCELED  Anti-EJ Ab (RDL) Unknown CANCELED  Anti-OJ Ab (RDL) Unknown CANCELED  Anti-SRP Ab (RDL) Unknown CANCELED  Anti-Mi-2 Ab (RDL) Unknown CANCELED  Anti-TIF-1gamma Ab (RDL) Latest Units: Units CANCELED  Anti-MDA-5 Ab (CADM-140)(RDL) Latest Units: Units CANCELED  Anti-NXP-2 (P140) Ab (RDL) Latest Units: Units CANCELED  Anti-SAE1 Ab, IgG (RDL) Latest Units: Units CANCELED  Anti-PM/Scl-100 Ab (RDL) Latest Ref  Range: <20 Units <20  Anti-Ku Ab (RDL) Unknown CANCELED  Anti-SS-A 52kD Ab, IgG (RDL) Latest Ref Range: <20 Units <20  Anti-U1 RNP Ab (RDL) Latest Units: Units CANCELED    ROS: She has fatigue for the last several years and also arthralgia.  She attributes this to fibromyalgia.  She also has pain in her fingers which she attributes due to fibromyalgia.  She has heartburn from hiatal hernia she says.  She also snores because of her sleep apnea and she has a rash related to eczema.  But otherwise negative.   FAMILY HISTORY of LUNG DISEASE: She thinks her dad might have had COPD and her grandfather.  However nobody with pulmonary fibrosis or other lung diseases.   EXPOSURE HISTORY: Denies ever having smoked cigarettes although she grew up in a home with her dad smoked.  Therefore positive for passive smoking as a child.  Denies cigar use.  Denies marijuana use denies cocaine use denies IV drug use.  Denies vaping.   HOME and HOBBY DETAILS : [place]  She is lived in this home for 22 years.  The home is 68 years old.  She has noticed mildew periodically in the shower curtain and in the bathroom.  She does use a CPAP mask but there is no water circuit mold or mildew in it.  She does do gardening with the flower garden she does use  a steam iron but there is no mold or mildew in it.  There is no pet birds.  No feather pillow or duvet.  No pet gerbils.  No mold in the [facility] duct.  Does not play any wind instruments.  Does not use a humidifier.  Does not have a Jacuzzi in the house.   OCCUPATIONAL HISTORY (122 questions) : Essentially negative.  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   PULMONARY TOXICITY HISTORY (27 items): Negative except prednisone use    Results for SHAKISHA, ABEND (MRN [id number]) as of 05/06/2019 12:01  Ref. Range 11/19/2018 but ? Done 02/28/2019 14:57  FVC-Pre Latest Units: L 2.29  FVC-%Pred-Pre Latest Units: % 70   Results for KIEANNA, ROLLO (MRN [id number]) as of 05/06/2019 12:01  Ref. Range 11/19/2018 but ? Done 02/28/2019 14:57  DLCO unc Latest Units: ml/min/mmHg 15.75  DLCO unc % pred Latest Units: % 77   Simple office walk 185 feet x  3 laps goal with forehead probe 05/06/2019   O2 used ra  Number laps completed 3  Comments about pace avg  Resting Pulse Ox/HR 98% and 90/min  Final Pulse Ox/HR 96% and 100/min  Desaturated </= 88% no  Desaturated <= 3% points no  Got Tachycardic >/= 90/min yes  Symptoms at end of test Mod dyspnea, knee pain  Miscellaneous comments x   She had a high-resolution CT chest September 24, 2018 at [facility].  This was read by our radiologist Dr Eddie Candle..  I do not have the image to visualized but I trust this report.  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  In addition is described a 1.4 cm right upper lobe posterior segment inferior part nodule that is subpleural and also 8 mm subpleural right middle lobe nodule.  He says these findings are unchanged compared to 2008 and  are benign.  OV 06/03/2019 -telephone visit.  Patient identified with 2 person identifier.   Risks, benefits and limitations of telephone visit explained.  Subjective:  Patient ID: Nichole Reyes, female , DOB: [birth year] , age 74 y.o. , MRN: [id number] , ADDRESS: [address] [place] [address]   Clinical IPF diagnosis given dec 2020 and reiterated Mid Feb 2021:  based on your age greater than 65, clubbing on exam, CT scan with high probability for this condition, negative history on your questionnaire and negative blood test on your serology exams   06/03/2019 -  Fu IPF - focus on esbriet monitoring and any other questions she might have   HPI Nichole Reyes 70 y.o. - on Esbriet now. Started it 05/22/2019 Thursday. Currently on 2 pills tid. Taking it with food. Spacing 5-6h between dosing. No side effects at this point.  From a dyspnea standpoint she is stable.  She had questions about being able to do gardening.  We discussed the fact this could cause mold exposure and organic dust exposure that could irritate her IPF.  She says she can do it with masking.  Gardening gives her purpose so I supported it.  She also plan [place] last week and was quite short of breath and took an inhaler.  I have suggested that she use a pulse ox meter and monitor her pulse ox and call us if her pulse ox below 88% so we can order portable oxygen [obviously will have to do stress testing submaximal 6-minute walk test] otherwise doing well.    OV 12/16/2019   Subjective:  Patient ID: Nichole Reyes, female , DOB: 1951-02-25, age 64 y.o. years. , MRN: [id number],  ADDRESS: [place] [address] PCP  Rory Percy, MD Providers : Treatment Team:  Attending Provider: Brand Males, MD   Chief Complaint  Patient presents with  . Follow-up    Tired all the time. redness/itching to bilateral nick and insides of  arms.  stress/heat?     Clinical IPF diagnosis given dec 2020 and reiterated Mid Feb 2021:  based on your age greater than 65, clubbing on exam, CT scan with  high probability for this condition, negative history on your questionnaire and negative blood test on your serology exams . - on Esbriet now. Started it 05/22/2019   GERD/Hiatal Hernia  HPI Nichole Reyes 70 y.o. -returns for follow-up.  Last seen in early part of 2021.  After that she see nurse practitioner 2 times once or at least telephone visit.  She tried it in pulmonary rehabilitation for IPF but she had logistical issues.  Early morning she has to get her husband ready for work.  Late in the afternoon she has to pick up her granddaughter.  She also has knee issues from a fibromyalgia.  Therefore is not able to do pulmonary rehabilitation.  She is very compliant with her pirfenidone.  She feels she is tolerating it fine but she did admit to low appetite and also has a 7 pound weight loss.  She is also fatigued.  She thinks the fatigue is present for the last few months.  She thinks it might be related to her daughter's wedding but then the fatigue has been there for the last few months while the daughter's wedding happened a few weeks ago.  She is not on oxygen.  Walking desaturation test is stable.  Most recent pulmonary function test is stable.  Symptom score other than fatigue  is also stable.  She continues PPI for her acid reflux.   PFT Results Latest Ref Rng & Units 08/26/2019 11/19/2018  FVC-Pre L 2.36 2.29  FVC-Predicted Pre % 72 70  FVC-Post L - 2.30  FVC-Predicted Post % - 70  Pre FEV1/FVC % % 88 89  Post FEV1/FCV % % - 90  FEV1-Pre L 2.08 2.04  FEV1-Predicted Pre % 83 82  FEV1-Post L - 2.07  DLCO uncorrected ml/min/mmHg 15.62 15.75  DLCO UNC% % 76 77  DLCO corrected ml/min/mmHg 15.62 -  DLCO COR %Predicted % 76 -  DLVA Predicted % 113 118  TLC L - 3.44  TLC % Predicted % - 66  RV % Predicted % - 50         OV 05/06/2020  Subjective:  Patient ID: Nichole Reyes, female , DOB: 1950-08-26 , age 63 y.o. , MRN: [id number] , ADDRESS: [place]  [address] PCP Rory Percy, MD Patient Care Team: Rory Percy, MD as PCP - General (Family Medicine) Herminio Commons, MD (Inactive) as PCP - Cardiology (Cardiology)  This Provider for this visit: Treatment Team:  Attending Provider: Brand Males, MD    05/06/2020 -   Chief Complaint  Patient presents with  . Follow-up    SOB with exertion    Clinical IPF diagnosis given dec 2020 and reiterated Mid Feb 2021:  based on your age greater than 65, clubbing on exam, CT scan with high probability for this condition, negative history on your questionnaire and negative blood test on your serology exams . - on Esbriet now. Started it 05/22/2019   GERD/Hiatal Hernia  Last CT [facility] of the chest July 2020 [facility] with probable UIP Last PFT June 2021  HPI Nichole Reyes 70 y.o. -presents for follow-up.  Last seen in September 2021.  Since then she continues to deal with her right knee issue.  She is seen Dr. Bo Merino for this.  Apparently a knee MRI is pending.  She is continuing to lose weight.  She has lost 10 pounds in the last 1 year.  This correlates with starting pirfenidone.  Technically she is still overweight.  She is also reporting associated fatigue and metallic taste.  A few months ago she made a call and we told her to hold the pirfenidone.  After that the metallic taste largely resolved but it does come back although to a much lesser severity.  At this point in time it does not bother her too much so she think she will continue to take pirfenidone.  From a respiratory standpoint she feels stable although she would say that in the last 1 year she feels the disease might be slowly progressive.  Her symptom score shows slight worsening especially with shortness of breath and weight loss but same fatigue.  Some nausea with pirfenidone.  He was supposed to pulmonary function testing today but for some reason has not happened.  Last PFT was June 2021.  Last CT scan of the  chest was July 2020 I do not have echocardiogram in our system.  She did have a low risk nuclear medicine stress test with 59% in June 2020      SYMPTOM SCALE - ILD 05/06/2019 179# 12/16/2019 172# - esbriet + 05/06/2020 169# - esbriet  O2 use ra    Shortness of Breath 0 -> 5 scale with 5 being worst (score 6 If unable to do)    At rest 4 1 0  Simple tasks - showers, clothes change, eating, shaving $RemoveBeforeD'4 1 2  'CkyhbdoqixocCG$ Household (dishes, doing bed, laundry) $RemoveBefor'5 5 4  'ONxzpMxUQYVt$ Shopping $RemoveBefo'5 3 4  'vVBUXDGPzSA$ Walking level at own pace $Remove'5 3 4  'CsBTMqF$ Walking up Stairs $RemoveB'5 5 5  'fTkuPheF$ Total (30-36) Dyspnea Score $RemoveBefor'28 15 19  'pNrhKhTuIRlb$ How bad is your cough? 2.5 1 0  How bad is your fatigue  yes 4 3  How bad is nausea x 0 2  How bad is vomiting?  x 0 0  How bad is diarrhea? x 0 0  How bad is anxiety? x 00 1  How bad is depression x 0 3      Simple office walk 185 feet x  3 laps goal with forehead probe 05/06/2019  12/16/2019  05/06/2020   O2 used ra ra ra  Number laps completed $RemoveBefore'3 3 3 'pHRwqQboeDFRB$ goal - stopped at 2 due to dyspnea  Comments about pace avg mod Slow pace  Resting Pulse Ox/HR 98% and 90/min 97% and 77/min 98% and 90/min  Final Pulse Ox/HR 96% and 100/min 97% and 91/mn 94% and 92 at 2nd lap  Desaturated </= 88% no no no  Desaturated <= 3% points no no Yes,, 4 pint  Got Tachycardic >/= 90/min yes yes yes  Symptoms at end of test Mod dyspnea, knee pain Dyspnea and fatigue strting at 1st lap and increased by 3rd stoppe early and did drop 3 points  Miscellaneous comments x        PFT  PFT Results Latest Ref Rng & Units 08/26/2019 11/19/2018  FVC-Pre L 2.36 2.29  FVC-Predicted Pre % 72 70  FVC-Post L - 2.30  FVC-Predicted Post % - 70  Pre FEV1/FVC % % 88 89  Post FEV1/FCV % % - 90  FEV1-Pre L 2.08 2.04  FEV1-Predicted Pre % 83 82  FEV1-Post L - 2.07  DLCO uncorrected ml/min/mmHg 15.62 15.75  DLCO UNC% % 76 77  DLCO corrected ml/min/mmHg 15.62 -  DLCO COR %Predicted % 76 -  DLVA Predicted % 113 118  TLC L - 3.44  TLC % Predicted % - 66  RV %  Predicted % - 50       has a past medical history of Diabetes mellitus without complication ([place]), Fibromyalgia, High cholesterol, Hypertension, Idiopathic pulmonary fibrosis ([place]), and Osteoporosis.   reports that she has never smoked. She has never used smokeless tobacco.  Past Surgical History:  Procedure Laterality Date  . ABDOMINAL HYSTERECTOMY  2007  . CATARACT EXTRACTION Bilateral     Allergies  Allergen Reactions  . Latex   . Pseudoephedrine Other (See Comments)    Rash and hallucinations   . Sulfa Antibiotics   . Sulfonamide Derivatives Rash    Immunization History  Administered Date(s) Administered  . Fluad Quad(high Dose 65+) 01/29/2019, 12/16/2019  . Influenza Split 12/18/2017  . Influenza-Unspecified 11/18/2013  . PFIZER(Purple Top)SARS-COV-2 Vaccination 05/15/2019, 06/13/2019  . Tdap 05/04/1999    Family History  Problem Relation Age of Onset  . Hypertension Mother   . Diabetes Mother   . Osteoporosis Mother   . Hypertension Father   . Heart disease Father   . COPD Father   . Colon cancer Sister   . Cancer Brother   . Heart disease Brother   . Emphysema Paternal Grandfather   . Heart disease Brother      Current Outpatient Medications:  .  ACCU-CHEK AVIVA PLUS test strip, , Disp: , Rfl:  .  Accu-Chek Softclix Lancets lancets, , Disp: ,  Rfl:  .  albuterol (VENTOLIN HFA) 108 (90 Base) MCG/ACT inhaler, 2 puffs every 4 (four) hours as needed. , Disp: , Rfl:  .  aspirin EC 81 MG tablet, Take 81 mg by mouth daily., Disp: , Rfl:  .  Blood Glucose Monitoring Suppl (ACCU-CHEK AVIVA PLUS) w/Device KIT, , Disp: , Rfl:  .  Calcium Carbonate (CALTRATE 600 PO), Take 1 tablet by mouth 2 (two) times a day., Disp: , Rfl:  .  clonazePAM (KLONOPIN) 0.5 MG tablet, Take 0.5 mg by mouth daily. , Disp: , Rfl:  .  ESBRIET 267 MG TABS, TAKE 3 TABLETS THREE TIMES A DAY WITH FOOD, Disp: 270 tablet, Rfl: 5 .  ezetimibe (ZETIA) 10 MG tablet, Take 1 tablet (10 mg total) by  mouth daily., Disp: 30 tablet, Rfl: 0 .  fenofibrate (TRICOR) 48 MG tablet, Take 48 mg by mouth daily. , Disp: , Rfl:  .  FLOVENT HFA 110 MCG/ACT inhaler, INHALE 1 PUFF INTO THE LUNGS 2 TIMES A DAY., Disp: 12 g, Rfl: 3 .  LOSARTAN POTASSIUM PO, Take by mouth., Disp: , Rfl:  .  metFORMIN (GLUCOPHAGE-XR) 500 MG 24 hr tablet, Take 500 mg by mouth daily with breakfast. , Disp: , Rfl:  .  metoprolol succinate (TOPROL-XL) 50 MG 24 hr tablet, Take 50 mg by mouth daily. , Disp: , Rfl:  .  naproxen (NAPROSYN) 500 MG tablet, naproxen 500 mg tablet, Disp: , Rfl:  .  omeprazole (PRILOSEC) 20 MG capsule, TAKE 1 CAPSULE BY MOUTH ONCE DAILY., Disp: 30 capsule, Rfl: 5 .  SAVELLA 50 MG TABS tablet, Take 50 mg by mouth 2 (two) times daily. , Disp: , Rfl:  .  tiZANidine (ZANAFLEX) 4 MG tablet, TAKE (1) TABLET EVERY NIGHT AT BEDTIME., Disp: 30 tablet, Rfl: 2 .  triamcinolone cream (KENALOG) 0.1 %, SMARTSIG:1 Application Topical 2-3 Times Daily, Disp: , Rfl:       Objective:   Vitals:   05/06/20 1605  BP: 112/62  Pulse: 90  Temp: (!) 97 F (36.1 C)  TempSrc: Oral  SpO2: 95%  Weight: 169 lb (76.7 kg)  Height: 5' 5.5" (1.664 m)    Estimated body mass index is 27.7 kg/m as calculated from the following:   Height as of this encounter: 5' 5.5" (1.664 m).   Weight as of this encounter: 169 lb (76.7 kg).  $Rem'@WEIGHTCHANGE'Hnzk$ @  [employer]   05/06/20 1605  Weight: 169 lb (76.7 kg)     Physical Exam   General: No distress. ovwerweight Neuro: Alert and Oriented x 3. GCS 15. Speech normal Psych: Pleasant Resp:  Barrel Chest - no.  Wheeze - no, Crackles - maybe some at ase, No overt respiratory distress CVS: Normal heart sounds. Murmurs - no Ext: Stigmata of Connective Tissue Disease - no HEENT: Normal upper airway. PEERL +. No post nasal drip        Assessment:       ICD-10-CM   1. ILD (interstitial lung disease) (HCC)  J84.9 Hepatic function panel    ECHOCARDIOGRAM COMPLETE    CT Chest High  Resolution    Pulmonary function test       Plan:     Patient Instructions  IPF (idiopathic pulmonary fibrosis) (HCC) Medication monitoring encounter Gastroesophageal reflux disease, unspecified whether esophagitis present Dyspnea on exertion  -IPF might be slowly progressive as evidenced by slow worsening fatigue and shortness of breath in the past 1 year  Plan -Continue acid reflux medications -Continue pirfenidone per schedule [  side effect issues discussed]  -At this point in time benefit outweighs risk  -Continue space between meals and apply sunscreen when going out -Check liver function test today 05/06/2020  -Do the following tests in the next few to several weeks at a [facility] location closest to your home such as [place]  -Get echocardiogram -in the next few to several weeks  -Get high-resolution CT scan of the chest supine and prone -in the next few to several weeks  -Get spirometry and DLCO -in the next few to several weeks  -At follow-up we will discuss care options such as clinical trials   Fatigue, unspecified type  -There is a combination of right knee issue, weight related issues, fibromyalgia, pulmonary fibrosis and also pirfenidone  Plan -From a pulmonary perspective and pirfenidone perspective we will continue to monitor your fatigue but we will see if the echocardiogram and CT scan shows any reasons for fatigue  Drug-induced weight loss  -10 pound weight loss but in February 2021 and February 2022 most likely because of pirfenidone -At this point in time is still are technically overweight -Therefore we will leverage pirfenidone and helping you with your weight loss -We will continue to monitor -Do talk to primary care physician about other reasons for weight loss  Vaccine counseling  --Please talk to PCP Rory Percy, MD -  and ensure you get  shingrix ([place]) inactivated vaccine against shingles   Followup -Return to see Dr. Chase Caller in the  next few to several weeks but after completing the above tests     SIGNATURE    Dr. Brand Males, M.D., F.C.C.P,  Pulmonary and Critical Care Medicine Staff Physician, [place] Director - Interstitial Lung Disease  Program  P[facility] [place] [facility] [place], [place], [address]  Pager: [phone], If no answer or between  15:00h - 7:00h: call [phone] Telephone: [phone]  5:10 PM 05/06/2020

## 2020-05-06 NOTE — Patient Instructions (Addendum)
IPF (idiopathic pulmonary fibrosis) (HCC) Medication monitoring encounter Gastroesophageal reflux disease, unspecified whether esophagitis present Dyspnea on exertion  -IPF might be slowly progressive as evidenced by slow worsening fatigue and shortness of breath in the past 1 year  Plan -Continue acid reflux medications -Continue pirfenidone per schedule [side effect issues discussed]  -At this point in time benefit outweighs risk  -Continue space between meals and apply sunscreen when going out -Check liver function test today 05/06/2020  -Do the following tests in the next few to several weeks at a [place] location closest to your home such as [place]  -Get echocardiogram -in the next few to several weeks  -Get high-resolution CT scan of the chest supine and prone -in the next few to several weeks  -Get spirometry and DLCO -in the next few to several weeks  -At follow-up we will discuss care options such as clinical trials   Fatigue, unspecified type  -There is a combination of right knee issue, weight related issues, fibromyalgia, pulmonary fibrosis and also pirfenidone  Plan -From a pulmonary perspective and pirfenidone perspective we will continue to monitor your fatigue but we will see if the echocardiogram and CT scan shows any reasons for fatigue  Drug-induced weight loss  -10 pound weight loss but in February 2021 and February 2022 most likely because of pirfenidone -At this point in time is still are technically overweight -Therefore we will leverage pirfenidone and helping you with your weight loss -We will continue to monitor -Do talk to primary care physician about other reasons for weight loss  Vaccine counseling  --Please talk to PCP Rory Percy, MD -  and ensure you get  shingrix (GSK) inactivated vaccine against shingles   Followup -Return to see Dr. Chase Caller in the next few to several weeks but after completing the above tests

## 2020-05-31 ENCOUNTER — Other Ambulatory Visit: Payer: Self-pay

## 2020-05-31 ENCOUNTER — Other Ambulatory Visit (HOSPITAL_COMMUNITY)
Admission: RE | Admit: 2020-05-31 | Discharge: 2020-05-31 | Disposition: A | Discharge status: Home / Self Care | Payer: Medicare Other | Source: Ambulatory Visit | Attending: Internal Medicine | Admitting: Internal Medicine

## 2020-05-31 ENCOUNTER — Ambulatory Visit (HOSPITAL_COMMUNITY)
Admission: RE | Admit: 2020-05-31 | Discharge: 2020-05-31 | Disposition: A | Discharge status: Home / Self Care | Payer: Medicare Other | Source: Ambulatory Visit | Attending: Internal Medicine | Admitting: Internal Medicine

## 2020-05-31 DIAGNOSIS — J849 Interstitial pulmonary disease, unspecified: Secondary | ICD-10-CM | POA: Insufficient documentation

## 2020-05-31 LAB — HEPATIC FUNCTION PANEL
ALT: 17 U/L (ref 0–44)
AST: 18 U/L (ref 15–41)
Albumin: 3.7 g/dL (ref 3.5–5.0)
Alkaline Phosphatase: 63 U/L (ref 38–126)
Bilirubin, Direct: <0.1 mg/dL (ref 0.0–0.2)
Total Bilirubin: 0.5 mg/dL (ref 0.3–1.2)
Total Protein: 7.1 g/dL (ref 6.5–8.1)

## 2020-06-13 ENCOUNTER — Telehealth: Payer: Self-pay | Admitting: Internal Medicine

## 2020-06-13 NOTE — Telephone Encounter (Signed)
     For radiology addendum:   - please call radiologist assistant line [phone] and specify Nichole Reyes , [birth year] and [phone] - mention imaging type HRCT and date 05/31/20 - request addendum for purpose of - comprare ILD progression compared to July 2020   Please send phone message back when done  Thanks    SIGNATURE    Dr. Brand Males, M.D., F.C.C.P,  Pulmonary and Critical Care Medicine Staff Physician, [place] Director - Interstitial Lung Disease  Program  Pulmonary [place] at [place], [place], [address]  Pager: [phone], If no answer  OR between  19:00-7:00h: page [phone] Telephone (clinical office): [phone] Telephone (research): [phone]  7:27 PM 06/13/2020

## 2020-06-17 NOTE — Telephone Encounter (Addendum)
[facility] and spoke with Opal Sidles letting her know the info stated by MR about addendum to be done on pt's recent HRCT. Opal Sidles verbalized the info needed for the addendum back to me and she said that she would send this to Dr. Weber Cooks for him to review. Nothing further needed.

## 2020-06-24 ENCOUNTER — Other Ambulatory Visit (HOSPITAL_COMMUNITY): Payer: Medicare Other

## 2020-06-24 NOTE — Progress Notes (Signed)
No progression in IPF on CT. Will be seeing end aprl 2022

## 2020-06-29 ENCOUNTER — Ambulatory Visit (HOSPITAL_COMMUNITY): Payer: Medicare Other | Attending: Internal Medicine

## 2020-06-29 ENCOUNTER — Other Ambulatory Visit: Payer: Self-pay

## 2020-06-29 DIAGNOSIS — R0600 Dyspnea, unspecified: Secondary | ICD-10-CM | POA: Diagnosis not present

## 2020-06-29 DIAGNOSIS — J849 Interstitial pulmonary disease, unspecified: Secondary | ICD-10-CM | POA: Insufficient documentation

## 2020-06-29 LAB — ECHOCARDIOGRAM COMPLETE
Area-P 1/2: 3.27 cm2
S' Lateral: 2.6 cm

## 2020-06-29 MED ORDER — PERFLUTREN LIPID MICROSPHERE
1.0000 mL | INTRAVENOUS | Status: AC | PRN
  Administered 2020-06-29: 2 mL via INTRAVENOUS

## 2020-07-13 ENCOUNTER — Ambulatory Visit: Payer: Medicare Other

## 2020-07-13 ENCOUNTER — Encounter: Payer: Self-pay | Admitting: Internal Medicine

## 2020-07-13 ENCOUNTER — Other Ambulatory Visit: Payer: Self-pay

## 2020-07-13 ENCOUNTER — Ambulatory Visit: Payer: Medicare Other | Admitting: Internal Medicine

## 2020-07-13 VITALS — BP 116/78 | HR 105 | Temp 97.2°F | Ht 65.0 in | Wt 167.8 lb

## 2020-07-13 DIAGNOSIS — Z5181 Encounter for therapeutic drug level monitoring: Secondary | ICD-10-CM

## 2020-07-13 DIAGNOSIS — R0609 Other forms of dyspnea: Secondary | ICD-10-CM

## 2020-07-13 DIAGNOSIS — J84112 Idiopathic pulmonary fibrosis: Secondary | ICD-10-CM

## 2020-07-13 DIAGNOSIS — R634 Abnormal weight loss: Secondary | ICD-10-CM | POA: Diagnosis not present

## 2020-07-13 DIAGNOSIS — T50905A Adverse effect of unspecified drugs, medicaments and biological substances, initial encounter: Secondary | ICD-10-CM

## 2020-07-13 DIAGNOSIS — R0600 Dyspnea, unspecified: Secondary | ICD-10-CM | POA: Diagnosis not present

## 2020-07-13 DIAGNOSIS — J849 Interstitial pulmonary disease, unspecified: Secondary | ICD-10-CM

## 2020-07-13 DIAGNOSIS — I2510 Atherosclerotic heart disease of native coronary artery without angina pectoris: Secondary | ICD-10-CM

## 2020-07-13 LAB — PULMONARY FUNCTION TEST
DL/VA % pred: 107 %
DL/VA: 4.42 ml/min/mmHg/L
DLCO cor % pred: 68 %
DLCO cor: 13.98 ml/min/mmHg
DLCO unc % pred: 68 %
DLCO unc: 13.98 ml/min/mmHg
FEF 25-75 Pre: 3.48 L/sec
FEF2575-%Pred-Pre: 166 %
FEV1-%Pred-Pre: 84 %
FEV1-Pre: 2.07 L
FEV1FVC-%Pred-Pre: 116 %
FEV6-%Pred-Pre: 75 %
FEV6-Pre: 2.32 L
FEV6FVC-%Pred-Pre: 104 %
FVC-%Pred-Pre: 72 %
FVC-Pre: 2.32 L
Pre FEV1/FVC ratio: 89 %
Pre FEV6/FVC Ratio: 100 %

## 2020-07-13 NOTE — Patient Instructions (Addendum)
IPF (idiopathic pulmonary fibrosis) (HCC) Medication monitoring encounter Gastroesophageal reflux disease, unspecified whether esophagitis present Dyspnea on exertion  -IPF is actually stable compared to 2020 -You are having some weight loss in association with pirfenidone There might be cardiac issues such as coronary artery disease associated with worsening shortness of breath  -This is based on the fact he has some aortic valve calcifications and also coronary artery calcification on CT scan of the chest   Plan -Continue acid reflux medications -Continue pirfenidone per schedule [side effect issues of weight loss discussed]  -At this point in time benefit outweighs risk  -Continue space between meals and apply sunscreen when going out -We discussed clinical trials as a care option and appreciate your interest  -We can look at prescreen for potential research protocols in the future  -Do repeat spirometry and DLCO in 3-4 months  Coronary artery calcification seen on CT scan of the chest  Plan  - Refer Dr. Rozann Lesches at [facility] cardiology   Drug-induced weight loss  -12 pound weight loss but in February 2021 and April 2022 most likely because of pirfenidone -At this point in time is still are technically overweight -Therefore we will leverage pirfenidone and helping you with your weight loss -We will continue to monitor -Do talk to primary care physician about other reasons for weight loss    Followup -Return to see Dr. Chase Caller in 3 months  - 4 months but after repeat spirometry and DLCO  -ILD symptom score and simple walk test at follow-up

## 2020-07-13 NOTE — Progress Notes (Signed)
OV 11/19/2018: Nichole Reyes is a 70 year old woman referred for evaluation of dyspnea for the past several months.  In the same time interval she is also noticed fatigue.  She has previously undergone a negative cardiac evaluation and an outside CT scan of her chest.  Per report, the CT demonstrates fibrotic changes. She is a never- smoker.  She has previously been evaluated by rheumatology, Dr. Estanislado Pandy, for fibromyalgia and osteoarthritis.  She began to notice dyspnea on exertion when exercising in February 2020.  She had previously been able to walk 35 to 40 minutes, but decreased to about 20 minutes at a time due to dyspnea.  She was hospitalized in [place] in June 2020 for dyspnea. At that time she was COVID negative.  She was discharged on albuterol, which she has been using up to 4 times a day, which improves her symptoms.  There is no family history of fibrotic lung disease; multiple family members had COPD.  Her maternal grandmother had rheumatoid arthritis, but otherwise there is no family history of rheumatologic disease.  In addition to dyspnea on exertion with occasional dyspnea at rest she endorses cough and sputum production.  She denies wheezing, postnasal drip, coughing or choking when eating, Raynaud's, muscle weakness, rashes, dry eyes or mouth, joint swelling, prolonged morning stiffness.  She has occasional hand stiffness throughout the day, not associated with rest.  She has a hiatal hernia with occasional GERD with symptoms less than once a week, associated with certain foods.  She is a never smoker or vaper.  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  She has never worked in [employer] or shipyards.  She does not have any dusty hobbies.  She has a Programmer, systems, but has never had pet birds.  She does not regularly use hot tubs.  She has never had exposure to medical radiation, methotrexate,  Macrobid, amiodarone, bleomycin.  Her only new medication was Zetia last month for hypertriglyceridemia.  Otherwise she has not had medication changes in greater than a year.  Last month she has had 6 episodes of epistaxis, which has improved with use of intranasal steroids prescribed by her PCP.     OV 05/06/2019  Subjective:  Patient ID: Nichole Reyes, female , DOB: [birth year] , age 56 y.o. , MRN: [id number] , ADDRESS: [place] [id number]   05/06/2019 -   Chief Complaint  Patient presents with  . Follow-up    Pt states she has both good days and bad days with breathing. Pt states she will occ have to use her rescue inhaler at least 1-2 times with activities.     HPI Nichole Reyes 70 y.o. -referred by Dr. 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  She was first seen by Dr. Carlis Abbott in September 2020 noted above.  She followed up in December 2020 and had pulmonary function test.  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  Due to insurance delays she has not started the nintedanib yet.  She is wondering about this.  She has specific questions about taking Covid vaccine.  She is also worried about her significant cough.  Review shows she is on ACE inhibitor.  She has a tickle in the throat.  She has acid reflux and is on Prilosec.  She has a history  of hiatal hernia.  She is taken a decision on starting nintedanib but she is interested in hearing about the other antifibrotic pirfenidone because of insurance delay she has not started on anything as yet.   [facility] Comprehensive ILD Questionnaire  Symptoms:  -Insidious onset of shortness of breath 7 months ago.  Since it started it has been the same.  She also has episodic amount of dyspnea.  The severity classification is below.  She does not know when her cough started but she does have a cough associated with clearing of the throat.  It is  moderate in intensity.  She does cough at night.  She does bring up phlegm.  The phlegm is usually white but sometimes yellow.  She does feel a tickle in her throat but does not wake up in the middle of the night because of the cough.  There is no wheezing.  Cough is associated with ACE inhibitor intake.  SYMPTOM SCALE - ILD 05/06/2019   O2 use ra  Shortness of Breath 0 -> 5 scale with 5 being worst (score 6 If unable to do)  At rest 4  Simple tasks - showers, clothes change, eating, shaving 4  Household (dishes, doing bed, laundry) 5  Shopping 5  Walking level at own pace 5  Walking up Stairs 5  Total (30-36) Dyspnea Score 28  How bad is your cough? 2.5  How bad is your fatigue  yes  How bad is nausea x  How bad is vomiting?  x  How bad is diarrhea? x  How bad is anxiety? x  How bad is depression x       Past Medical History : Positive for fibromyalgia, hiatal hernia, sleep apnea both for the last few years.  There is also borderline diabetes..  And hyperlipidemia   she denies any asthma or COPD or heart failure rheumatoid arthritis or scleroderma or lupus.  Denies any polymyositis.  Denies Sjogren's.  Denies HIV.  Denies pulmonary hypertension.  Denies thyroid disease or stroke.  Denies seizures or mononucleosis or hepatitis or tuberculosis.  Denies kidney disease.  Denies pneumonia.  Denies history of blood clots or heart disease or pleurisy.  Autoimmune serology sept 2020  - normal Results for Nichole, Reyes (MRN [id number]) as of 05/06/2019 12:01  Ref. Range 11/19/2018 14:58  SEE BELOW Unknown Comment  Anti Nuclear Antibody (ANA) Latest Ref Range: Negative  Negative  Anti JO-1 Latest Ref Range: 0.0 - 0.9 AI <0.2  CENTROMERE AB SCREEN Latest Ref Range: 0.0 - 0.9 AI <0.2  dsDNA Ab Latest Ref Range: 0 - 9 IU/mL <1  ENA RNP Ab Latest Ref Range: 0.0 - 0.9 AI <0.2  ENA SSA (RO) Ab Latest Ref Range: 0.0 - 0.9 AI <0.2  ENA SSB (LA) Ab Latest Ref Range: 0.0 - 0.9 AI <0.2  ENA SM  Ab Ser-aCnc Latest Ref Range: 0.0 - 0.9 AI <0.2  Chromatin Ab SerPl-aCnc Latest Ref Range: 0.0 - 0.9 AI <0.2  Anti-Jo-1 Ab (RDL) Latest Ref Range: <20 Units <20  Anti-PL-7 Ab (RDL) Unknown CANCELED  Anti-PL-12 Ab (RDL) Unknown CANCELED  Anti-EJ Ab (RDL) Unknown CANCELED  Anti-OJ Ab (RDL) Unknown CANCELED  Anti-SRP Ab (RDL) Unknown CANCELED  Anti-Mi-2 Ab (RDL) Unknown CANCELED  Anti-TIF-1gamma Ab (RDL) Latest Units: Units CANCELED  Anti-MDA-5 Ab (CADM-140)(RDL) Latest Units: Units CANCELED  Anti-NXP-2 (P140) Ab (RDL) Latest Units: Units CANCELED  Anti-SAE1 Ab, IgG (RDL) Latest Units: Units CANCELED  Anti-PM/Scl-100 Ab (RDL) Latest Ref  Range: <20 Units <20  Anti-Ku Ab (RDL) Unknown CANCELED  Anti-SS-A 52kD Ab, IgG (RDL) Latest Ref Range: <20 Units <20  Anti-U1 RNP Ab (RDL) Latest Units: Units CANCELED    ROS: She has fatigue for the last several years and also arthralgia.  She attributes this to fibromyalgia.  She also has pain in her fingers which she attributes due to fibromyalgia.  She has heartburn from hiatal hernia she says.  She also snores because of her sleep apnea and she has a rash related to eczema.  But otherwise negative.   FAMILY HISTORY of LUNG DISEASE: She thinks her dad might have had COPD and her grandfather.  However nobody with pulmonary fibrosis or other lung diseases.   EXPOSURE HISTORY: Denies ever having smoked cigarettes although she grew up in a home with her dad smoked.  Therefore positive for passive smoking as a child.  Denies cigar use.  Denies marijuana use denies cocaine use denies IV drug use.  Denies vaping.   HOME and HOBBY DETAILS : [place]  She is lived in this home for 22 years.  The home is 68 years old.  She has noticed mildew periodically in the shower curtain and in the bathroom.  She does use a CPAP mask but there is no water circuit mold or mildew in it.  She does do gardening with the flower garden she does use  a steam iron but there is no mold or mildew in it.  There is no pet birds.  No feather pillow or duvet.  No pet gerbils.  No mold in the [facility] duct.  Does not play any wind instruments.  Does not use a humidifier.  Does not have a Jacuzzi in the house.   OCCUPATIONAL HISTORY (122 questions) : Essentially negative.  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   PULMONARY TOXICITY HISTORY (27 items): Negative except prednisone use    Results for Nichole, Reyes (MRN [id number]) as of 05/06/2019 12:01  Ref. Range 11/19/2018 but ? Done 02/28/2019 14:57  FVC-Pre Latest Units: L 2.29  FVC-%Pred-Pre Latest Units: % 70   Results for Nichole, Reyes (MRN [id number]) as of 05/06/2019 12:01  Ref. Range 11/19/2018 but ? Done 02/28/2019 14:57  DLCO unc Latest Units: ml/min/mmHg 15.75  DLCO unc % pred Latest Units: % 77   Simple office walk 185 feet x  3 laps goal with forehead probe 05/06/2019   O2 used ra  Number laps completed 3  Comments about pace avg  Resting Pulse Ox/HR 98% and 90/min  Final Pulse Ox/HR 96% and 100/min  Desaturated </= 88% no  Desaturated <= 3% points no  Got Tachycardic >/= 90/min yes  Symptoms at end of test Mod dyspnea, knee pain  Miscellaneous comments x   She had a high-resolution CT chest September 24, 2018 at [facility].  This was read by our radiologist Dr Eddie Candle..  I do not have the image to visualized but I trust this report.  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  In addition is described a 1.4 cm right upper lobe posterior segment inferior part nodule that is subpleural and also 8 mm subpleural right middle lobe nodule.  He says these findings are unchanged compared to 2008 and  are benign.  OV 06/03/2019 -telephone visit.  Patient identified with 2 person identifier.   Risks, benefits and limitations of telephone visit explained.  Subjective:  Patient ID: Nichole Reyes, female , DOB: [birth year] , age 70 y.o. , MRN: [id number] , ADDRESS: [address] [place] [address]   Clinical IPF diagnosis given dec 2020 and reiterated Mid Feb 2021:  based on your age greater than 65, clubbing on exam, CT scan with high probability for this condition, negative history on your questionnaire and negative blood test on your serology exams   06/03/2019 -  Fu IPF - focus on esbriet monitoring and any other questions she might have   HPI Nichole Reyes 70 y.o. - on Esbriet now. Started it 05/22/2019 Thursday. Currently on 2 pills tid. Taking it with food. Spacing 5-6h between dosing. No side effects at this point.  From a dyspnea standpoint she is stable.  She had questions about being able to do gardening.  We discussed the fact this could cause mold exposure and organic dust exposure that could irritate her IPF.  She says she can do it with masking.  Gardening gives her purpose so I supported it.  She also plan [place] last week and was quite short of breath and took an inhaler.  I have suggested that she use a pulse ox meter and monitor her pulse ox and call us if her pulse ox below 88% so we can order portable oxygen [obviously will have to do stress testing submaximal 6-minute walk test] otherwise doing well.    OV 12/16/2019   Subjective:  Patient ID: Nichole Reyes, female , DOB: [birth year], age 54 y.o. years. , MRN: [id number],  ADDRESS: [place] [address] PCP  Rory Percy, MD Providers : Treatment Team:  Attending Provider: Brand Males, MD   Chief Complaint  Patient presents with  . Follow-up    Tired all the time. redness/itching to bilateral nick and insides of  arms.  stress/heat?     Clinical IPF diagnosis given dec 2020 and reiterated Mid Feb 2021:  based on your age greater than 65, clubbing on exam, CT scan with  high probability for this condition, negative history on your questionnaire and negative blood test on your serology exams . - on Esbriet now. Started it 05/22/2019   GERD/Hiatal Hernia  HPI Nichole Reyes 70 y.o. -returns for follow-up.  Last seen in early part of 2021.  After that she see nurse practitioner 2 times once or at least telephone visit.  She tried it in pulmonary rehabilitation for IPF but she had logistical issues.  Early morning she has to get her husband ready for work.  Late in the afternoon she has to pick up her granddaughter.  She also has knee issues from a fibromyalgia.  Therefore is not able to do pulmonary rehabilitation.  She is very compliant with her pirfenidone.  She feels she is tolerating it fine but she did admit to low appetite and also has a 7 pound weight loss.  She is also fatigued.  She thinks the fatigue is present for the last few months.  She thinks it might be related to her daughter's wedding but then the fatigue has been there for the last few months while the daughter's wedding happened a few weeks ago.  She is not on oxygen.  Walking desaturation test is stable.  Most recent pulmonary function test is stable.  Symptom score other than fatigue  is also stable.  She continues PPI for her acid reflux.   PFT Results Latest Ref Rng & Units 08/26/2019 11/19/2018  FVC-Pre L 2.36 2.29  FVC-Predicted Pre % 72 70  FVC-Post L - 2.30  FVC-Predicted Post % - 70  Pre FEV1/FVC % % 88 89  Post FEV1/FCV % % - 90  FEV1-Pre L 2.08 2.04  FEV1-Predicted Pre % 83 82  FEV1-Post L - 2.07  DLCO uncorrected ml/min/mmHg 15.62 15.75  DLCO UNC% % 76 77  DLCO corrected ml/min/mmHg 15.62 -  DLCO COR %Predicted % 76 -  DLVA Predicted % 113 118  TLC L - 3.44  TLC % Predicted % - 66  RV % Predicted % - 50         OV 05/06/2020  Subjective:  Patient ID: Nichole Reyes, female , DOB: Dec 02, 1950 , age 80 y.o. , MRN: [id number] , ADDRESS: [place]  [id number] PCP Rory Percy, MD Patient Care Team: Rory Percy, MD as PCP - General (Family Medicine) Herminio Commons, MD (Inactive) as PCP - Cardiology (Cardiology)  This Provider for this visit: Treatment Team:  Attending Provider: Brand Males, MD    05/06/2020 -   Chief Complaint  Patient presents with  . Follow-up    SOB with exertion    Clinical IPF diagnosis given dec 2020 and reiterated Mid Feb 2021:  based on your age greater than 65, clubbing on exam, CT scan with high probability for this condition, negative history on your questionnaire and negative blood test on your serology exams . - on Esbriet now. Started it 05/22/2019   GERD/Hiatal Hernia  Last CT scan of the chest July 2020 at [facility] with probable UIP Last PFT June 2021  HPI Nichole Reyes 70 y.o. -presents for follow-up.  Last seen in September 2021.  Since then she continues to deal with her right knee issue.  She is seen Dr. Bo Merino for this.  Apparently a knee MRI is pending.  She is continuing to lose weight.  She has lost 10 pounds in the last 1 year.  This correlates with starting pirfenidone.  Technically she is still overweight.  She is also reporting associated fatigue and metallic taste.  A few months ago she made a call and we told her to hold the pirfenidone.  After that the metallic taste largely resolved but it does come back although to a much lesser severity.  At this point in time it does not bother her too much so she think she will continue to take pirfenidone.  From a respiratory standpoint she feels stable although she would say that in the last 1 year she feels the disease might be slowly progressive.  Her symptom score shows slight worsening especially with shortness of breath and weight loss but same fatigue.  Some nausea with pirfenidone.  He was supposed to pulmonary function testing today but for some reason has not happened.  Last PFT was June 2021.  Last CT scan of the  chest was July 2020 I do not have echocardiogram in our system.  She did have a low risk nuclear medicine stress test with 59% in June 2020    PFT  PFT Results Latest Ref Rng & Units 08/26/2019 11/19/2018  FVC-Pre L 2.36 2.29  FVC-Predicted Pre % 72 70  FVC-Post L - 2.30  FVC-Predicted Post % - 70  Pre FEV1/FVC % % 88 89  Post FEV1/FCV % % - 90  FEV1-Pre  L 2.08 2.04  FEV1-Predicted Pre % 83 82  FEV1-Post L - 2.07  DLCO uncorrected ml/min/mmHg 15.62 15.75  DLCO UNC% % 76 77  DLCO corrected ml/min/mmHg 15.62 -  DLCO COR %Predicted % 76 -  DLVA Predicted % 113 118  TLC L - 3.44  TLC % Predicted % - 66  RV % Predicted % - 50    OV 07/13/2020  Subjective:  Patient ID: Nichole Reyes, female , DOB: November 23, 1950 , age 27 y.o. , MRN: [id number] , ADDRESS: [place] [address] PCP Lanelle Bal, PA-C Patient Care Team: Lanelle Bal, PA-C as PCP - General (Family Medicine) Herminio Commons, MD (Inactive) as PCP - Cardiology (Cardiology)  This Provider for this visit: Treatment Team:  Attending Provider: Brand Males, MD    07/13/2020 -   Chief Complaint  Patient presents with  . Follow-up    No concerns, here to get PFT results.    Clinical IPF diagnosis given dec 2020 and reiterated Mid Feb 2021:  based on your age greater than 65, clubbing on exam, CT scan with high probability for this condition, negative history on your questionnaire and negative blood test on serology exams . - Oon Esbriet since 05/22/2019   GERD/Hiatal Hernia  Last PFT April 2022 Last CT scan of the chest March 2022   HPI Nichole Reyes 70 y.o. -returns for follow-up to discuss her test results.  She definitely has more shortness of breath now than she did in September 2021.  She continues to lose weight.  But her BMI is still 27.  No metallic taste no nausea vomiting or diarrhea.  No chest pains.  Her pulmonary function test shows slight decline in DLCO but FVC is stable.  Walking  desaturation to stable.  High-resolution CT chest shows stability in ILD compared to 2020.  Therefore overall she is probably stable.  There is evidence of coronary artery calcification and also aortic valve calcifications on the CT.  Her echo showed mild diastolic dysfunction that could be contributing to the dyspnea.  There is also mild aortic valve sclerosis.  There is no chest pain with exertion.  Her last cardiologist in [place] is no longer in the health system.  She is not seen a cardiologist in 7 years.  Does not recollect when her last stress test was.   WE discussed   1. Scientific Purpose  Clinical research is designed to produce generalizable knowledge and to answer questions about the safety and efficacy of intervention(s) under study in order to determine whether or not they may be useful for the care of future patients.  2. Study Procedures  Participation in a trial may involve procedures or tests, in addition to the intervention(s) under study, that are intended only or primarily to generate scientific knowledge and that are otherwise not necessary for patient care.   3. Uncertainty  For intervention(s) under study in clinical research, there often is less knowledge and more uncertainty about the risks and benefits to a population of trial participants than there is when a doctor offers a patient standard interventions.   4. Adherence to Protocol  Administration of the intervention(s) under study is typically based on a strict protocol with defined dose, scheduling, and use or avoidance of concurrent medications, compared to administration of standard interventions.  5. Clinician as Investigator  Clinicians who are in health care settings provide treatment; in a clinical trial setting, they are also investigating safety and efficacy of an  intervention. In otherwise your doctor or nurse practitioner can be wearing 2 hats - one as care giver another as Teacher, music  6. Patient as Visual merchandiser Subject  Patients participating in research trials are research subjects or volunteers. In other words participating in research is 100% voluntary and at one's own free weill. The decision to participate or not participate will NOT affect patient care and the doctor-patient relationship in any way   SYMPTOM SCALE - ILD 05/06/2019 179# 12/16/2019 172# - esbriet + 05/06/2020 169# - esbriet 07/13/2020 167# - on esbreiet  O2 use ra     Shortness of Breath 0 -> 5 scale with 5 being worst (score 6 If unable to do)     At rest 4 1 0 2  Simple tasks - showers, clothes change, eating, shaving $RemoveBeforeD'4 1 2 2  'NLrGYDEzVeIJyk$ Household (dishes, doing bed, laundry) $RemoveBefor'5 5 4 4  'DHKBNYpuOlzF$ Shopping $RemoveBefo'5 3 4 3  'jCpWNHelWQF$ Walking level at own pace $Remove'5 3 4 3  'KjGrVWX$ Walking up Stairs $RemoveB'5 5 5 4  'lwopcklU$ Total (30-36) Dyspnea Score $RemoveBefor'28 15 19 18  'svCzvUbJIhtI$ How bad is your cough? 2.5 1 0 0  How bad is your fatigue  yes $Re'4 3 4  'jYz$ How bad is nausea x 0 2 0  How bad is vomiting?  x 0 0 0  How bad is diarrhea? x 0 0 0  How bad is anxiety? x 00 1 2  How bad is depression x 0 3 2      Simple office walk 185 feet x  3 laps goal with forehead probe 05/06/2019  12/16/2019  05/06/2020  07/13/2020   O2 used ra ra ra ra  Number laps completed $RemoveBefore'3 3 3 'dyKUVWpMfwRMH$ goal - stopped at 2 due to dyspnea 3 and did all 3  Comments about pace avg mod Slow pace avg  Resting Pulse Ox/HR 98% and 90/min 97% and 77/min 98% and 90/min 96% and 94  Final Pulse Ox/HR 96% and 100/min 97% and 91/mn 94% and 92 at 2nd lap 93% and 103/min  Desaturated </= 88% no no no no  Desaturated <= 3% points no no Yes,, 4 pint Yes 3 points  Got Tachycardic >/= 90/min yes yes yes yes  Symptoms at end of test Mod dyspnea, knee pain Dyspnea and fatigue strting at 1st lap and increased by 3rd stoppe early and did drop 3 points Dyspnea at end  Miscellaneous comments x       ECHO 06/29/20  IMPRESSIONS    1. Left ventricular ejection fraction, by estimation, is 60 to 65%. The  left  ventricle has normal function. The left ventricle has no regional  wall motion abnormalities. There is mild left ventricular hypertrophy.  Left ventricular diastolic parameters  are consistent with Grade I diastolic dysfunction (impaired relaxation).  2. Right ventricular systolic function is normal. The right ventricular  size is normal. There is normal pulmonary artery systolic pressure. The  estimated right ventricular systolic pressure is 36.0 mmHg.  3. The mitral valve is grossly normal. Trivial mitral valve  regurgitation.  4. The aortic valve is tricuspid. Aortic valve regurgitation is not  visualized. Mild aortic valve sclerosis is present, with no evidence of  aortic valve stenosis.  5. The inferior vena cava is normal in size with greater than 50%  respiratory variability, suggesting right atrial pressure of 3 mmHg.   Comparison(s): No prior Echocardiogram.    CT Chest data march 22022  Narrative & Impression  CLINICAL DATA:  70 year old female with history of chronic dyspnea. Fibrotic changes noted on prior CT examination. Follow-up study.  EXAM: CT CHEST WITHOUT CONTRAST  TECHNIQUE: Multidetector CT imaging of the chest was performed following the standard protocol without intravenous contrast. High resolution imaging of the lungs, as well as inspiratory and expiratory imaging, was performed.  COMPARISON:  No priors available for comparison.  FINDINGS: Cardiovascular: Heart size is normal. There is no significant pericardial fluid, thickening or pericardial calcification. There is aortic atherosclerosis, as well as atherosclerosis of the great vessels of the mediastinum and the coronary arteries, including calcified atherosclerotic plaque in the left main, left anterior descending, left circumflex and right coronary arteries. Calcifications of the aortic valve.  Mediastinum/Nodes: No pathologically enlarged mediastinal or hilar lymph nodes. Please  note that accurate exclusion of hilar adenopathy is limited on noncontrast CT scans. Esophagus is unremarkable in appearance. No axillary lymphadenopathy.  Lungs/Pleura: High-resolution images demonstrate widespread areas of peripheral predominant ground-glass attenuation, septal thickening, mild cylindrical bronchiectasis and peripheral bronchiolectasis. Findings have a mild craniocaudal gradient. No definitive honeycombing is identified. Inspiratory and expiratory imaging is unremarkable. No acute consolidative airspace disease. No pleural effusions. In the periphery of the right middle lobe (axial image 89 of series 6) there is a 7 x 5 mm (mean diameter of 6 mm) subpleural pulmonary nodule (stable compared to prior study 09/24/2018). In the superior segment of the right lower lobe (axial image 73 of series 6) there is a 2.3 x 0.8 cm elongated nodular area of architectural distortion which is intimately associated with an accessory fissure, which appears slightly larger on axial images, but is completely stable in size and appearance when viewed on sagittal reconstructions.  Upper Abdomen: Aortic atherosclerosis.  Musculoskeletal: There are no aggressive appearing lytic or blastic lesions noted in the visualized portions of the skeleton.  IMPRESSION: 1. The appearance of the lungs is compatible with interstitial lung disease, with a spectrum of findings categorized as probable usual interstitial pneumonia (UIP) per current ATS guidelines. Repeat high-resolution chest CT is recommended in 12 months to assess for temporal changes in the appearance of the lung parenchyma. 2. Pulmonary nodules in the right lung are stable compared to the prior study from 2020, likely benign. Attention at time of repeat high-resolution chest CT is recommended to ensure continued stability. 3. Aortic atherosclerosis, in addition to left main and 3 vessel coronary artery disease. Please note that  although the presence of coronary artery calcium documents the presence of coronary artery disease, the severity of this disease and any potential stenosis cannot be assessed on this non-gated CT examination. Assessment for potential risk factor modification, dietary therapy or pharmacologic therapy may be warranted, if clinically indicated. 4. There are calcifications of the aortic valve. Echocardiographic correlation for evaluation of potential valvular dysfunction may be warranted if clinically indicated.  Aortic Atherosclerosis (ICD10-I70.0).  Electronically Signed: By: Vinnie Langton M.D. On: 06/01/2020 09:51  ADDENDUM REPORT: 06/21/2020 14:33  ADDENDUM: It was brought to my attention that there is a prior chest CT from 09/24/2018. After comparison between the 05/31/2020 exam and the 09/24/2018 exam, there is no evidence of progression. Assessment remains unchanged. Specifically, based on the spectrum of findings this continues to be categorized as probable usual interstitial pneumonia (UIP) per current ATS guidelines.   Electronically Signed   By: Vinnie Langton M.D.   On: 06/21/2020 14:33     No results found.    PFT  PFT Results Latest Ref Rng & Units 07/13/2020 08/26/2019 11/19/2018  FVC-Pre L 2.32 2.36 2.29  FVC-Predicted Pre % 72 72 70  FVC-Post L - - 2.30  FVC-Predicted Post % - - 70  Pre FEV1/FVC % % 89 88 89  Post FEV1/FCV % % - - 90  FEV1-Pre L 2.07 2.08 2.04  FEV1-Predicted Pre % 84 83 82  FEV1-Post L - - 2.07  DLCO uncorrected ml/min/mmHg 13.98 15.62 15.75  DLCO UNC% % 68 76 77  DLCO corrected ml/min/mmHg 13.98 15.62 -  DLCO COR %Predicted % 68 76 -  DLVA Predicted % 107 113 118  TLC L - - 3.44  TLC % Predicted % - - 66  RV % Predicted % - - 50       has a past medical history of Diabetes mellitus without complication (HCC), Fibromyalgia, High cholesterol, Hypertension, Idiopathic pulmonary fibrosis (HCC), and Osteoporosis.   reports  that she has never smoked. She has never used smokeless tobacco.  Past Surgical History:  Procedure Laterality Date  . ABDOMINAL HYSTERECTOMY  2007  . CATARACT EXTRACTION Bilateral     Allergies  Allergen Reactions  . Latex   . Pseudoephedrine Other (See Comments)    Rash and hallucinations   . Sulfa Antibiotics   . Sulfonamide Derivatives Rash    Immunization History  Administered Date(s) Administered  . Fluad Quad(high Dose 65+) 01/29/2019, 12/16/2019  . Influenza Split 12/18/2017  . Influenza-Unspecified 11/18/2013  . PFIZER(Purple Top)SARS-COV-2 Vaccination 05/15/2019, 06/13/2019  . Tdap 05/04/1999    Family History  Problem Relation Age of Onset  . Hypertension Mother   . Diabetes Mother   . Osteoporosis Mother   . Hypertension Father   . Heart disease Father   . COPD Father   . Colon cancer Sister   . Cancer Brother   . Heart disease Brother   . Emphysema Paternal Grandfather   . Heart disease Brother      Current Outpatient Medications:  .  ACCU-CHEK AVIVA PLUS test strip, , Disp: , Rfl:  .  Accu-Chek Softclix Lancets lancets, , Disp: , Rfl:  .  albuterol (VENTOLIN HFA) 108 (90 Base) MCG/ACT inhaler, 2 puffs every 4 (four) hours as needed. , Disp: , Rfl:  .  aspirin EC 81 MG tablet, Take 81 mg by mouth daily., Disp: , Rfl:  .  Blood Glucose Monitoring Suppl (ACCU-CHEK AVIVA PLUS) w/Device KIT, , Disp: , Rfl:  .  Calcium Carbonate (CALTRATE 600 PO), Take 1 tablet by mouth 2 (two) times a day., Disp: , Rfl:  .  clonazePAM (KLONOPIN) 0.5 MG tablet, Take 0.5 mg by mouth daily. , Disp: , Rfl:  .  ESBRIET 267 MG TABS, TAKE 3 TABLETS THREE TIMES A DAY WITH FOOD (Patient taking differently: Taking 1 large pill daily.  $Remov'801mg'HqAFLS$ ), Disp: 270 tablet, Rfl: 5 .  ezetimibe (ZETIA) 10 MG tablet, Take 1 tablet (10 mg total) by mouth daily., Disp: 30 tablet, Rfl: 0 .  fenofibrate (TRICOR) 48 MG tablet, Take 48 mg by mouth daily. , Disp: , Rfl:  .  FLOVENT HFA 110 MCG/ACT  inhaler, INHALE 1 PUFF INTO THE LUNGS 2 TIMES A DAY., Disp: 12 g, Rfl: 3 .  LOSARTAN POTASSIUM PO, Take by mouth., Disp: , Rfl:  .  metFORMIN (GLUCOPHAGE-XR) 500 MG 24 hr tablet, Take 500 mg by mouth daily with breakfast. , Disp: , Rfl:  .  metoprolol succinate (TOPROL-XL) 50 MG 24 hr tablet, Take 50 mg by mouth daily. , Disp: , Rfl:  .  naproxen (NAPROSYN) 500  MG tablet, naproxen 500 mg tablet, Disp: , Rfl:  .  omeprazole (PRILOSEC) 20 MG capsule, TAKE 1 CAPSULE BY MOUTH ONCE DAILY., Disp: 30 capsule, Rfl: 5 .  SAVELLA 50 MG TABS tablet, Take 50 mg by mouth 2 (two) times daily. , Disp: , Rfl:  .  tiZANidine (ZANAFLEX) 4 MG tablet, TAKE (1) TABLET EVERY NIGHT AT BEDTIME., Disp: 30 tablet, Rfl: 2 .  triamcinolone cream (KENALOG) 0.1 %, SMARTSIG:1 Application Topical 2-3 Times Daily, Disp: , Rfl:       Objective:   Vitals:   07/13/20 1340  BP: 116/78  Pulse: (!) 105  Temp: (!) 97.2 F (36.2 C)  TempSrc: Temporal  SpO2: 95%  Weight: 167 lb 12.8 oz (76.1 kg)  Height: $Remove'5\' 5"'RsrUiZU$  (1.651 m)    Estimated body mass index is 27.92 kg/m as calculated from the following:   Height as of this encounter: $RemoveBeforeD'5\' 5"'nOyFLABKmEWNlS$  (1.651 m).   Weight as of this encounter: 167 lb 12.8 oz (76.1 kg).  $Rem'@WEIGHTCHANGE'DGvB$ @  [employer]   07/13/20 1340  Weight: 167 lb 12.8 oz (76.1 kg)     Physical Exam   General: No distress. Look well Neuro: Alert and Oriented x 3. GCS 15. Speech normal Psych: Pleasant Resp:  Barrel Chest - no.  Wheeze - no, Crackles -yes mild at the lung base, No overt respiratory distress CVS: Normal heart sounds. Murmurs - no Ext: Stigmata of Connective Tissue Disease - no HEENT: Normal upper airway. PEERL +. No post nasal drip        Assessment:       ICD-10-CM   1. IPF (idiopathic pulmonary fibrosis) ([place])  J84.112   2. Medication monitoring encounter  Z51.81   3. DOE (dyspnea on exertion)  R06.00   4. Drug-induced weight loss  R63.4    T50.905A   5. Coronary artery calcification  seen on CT scan  I25.10        Plan:     Patient Instructions  IPF (idiopathic pulmonary fibrosis) (HCC) Medication monitoring encounter Gastroesophageal reflux disease, unspecified whether esophagitis present Dyspnea on exertion  -IPF is actually stable compared to 2020 -You are having some weight loss in association with pirfenidone There might be cardiac issues such as coronary artery disease associated with worsening shortness of breath  -This is based on the fact he has some aortic valve calcifications and also coronary artery calcification on CT scan of the chest   Plan -Continue acid reflux medications -Continue pirfenidone per schedule [side effect issues of weight loss discussed]  -At this point in time benefit outweighs risk  -Continue space between meals and apply sunscreen when going out -We discussed clinical trials as a care option and appreciate your interest  -We can look at prescreen for potential research protocols in the future  -Do repeat spirometry and DLCO in 3-4 months  Coronary artery calcification seen on CT scan of the chest  Plan  - Refer Dr. Rozann Lesches at [facility] cardiology   Drug-induced weight loss  -12 pound weight loss but in February 2021 and April 2022 most likely because of pirfenidone -At this point in time is still are technically overweight -Therefore we will leverage pirfenidone and helping you with your weight loss -We will continue to monitor -Do talk to primary care physician about other reasons for weight loss    Followup -Return to see Dr. Chase Caller in 3 months  - 4 months but after repeat spirometry and DLCO  -ILD symptom  score and simple walk test at follow-up       SIGNATURE    Dr. Brand Males, M.D., F.C.C.P,  Pulmonary and Critical Care Medicine Staff Physician, [place] Director - Interstitial Lung Disease  Program  [facility] at  [place], [place], [address]  Pager: [phone], If no answer or between  15:00h - 7:00h: call [phone] Telephone: [phone]  2:31 PM 07/13/2020

## 2020-07-27 ENCOUNTER — Other Ambulatory Visit: Payer: Self-pay | Admitting: Rheumatology

## 2020-07-27 NOTE — Telephone Encounter (Signed)
Next Visit: 09/07/2020  Last Visit: 03/09/2020  Last Fill: 27/2022   Dx: Acute pain of right knee  Current Dose per office note on 03/09/2020, not mentioned   Okay to refill Zanaflex?

## 2020-08-24 NOTE — Progress Notes (Deleted)
Office Visit Note  Patient: Nichole Reyes             Date of Birth: 1951/03/04           MRN: [id number]             PCP: Lanelle Bal, PA-C Referring: Rory Percy, MD Visit Date: 09/07/2020 Occupation: @GUAROCC @  Subjective:    History of Present Illness: Nichole Reyes is a 70 y.o. female with history of osteoarthritis, DDD, and fibromyalgia. She is taking zanaflex 4 mg at bedtime as needed for muscle spasms.   Activities of Daily Living:  Patient reports morning stiffness for *** {minute/hour:19697}.   Patient {ACTIONS;DENIES/REPORTS:21021675::"Denies"} nocturnal pain.  Difficulty dressing/grooming: {ACTIONS;DENIES/REPORTS:21021675::"Denies"} Difficulty climbing stairs: {ACTIONS;DENIES/REPORTS:21021675::"Denies"} Difficulty getting out of chair: {ACTIONS;DENIES/REPORTS:21021675::"Denies"} Difficulty using hands for taps, buttons, cutlery, and/or writing: {ACTIONS;DENIES/REPORTS:21021675::"Denies"}  No Rheumatology ROS completed.   PMFS History:  Patient Active Problem List   Diagnosis Date Noted   ILD (interstitial lung disease) ([place]) 08/26/2019   Fibromyalgia syndrome 06/26/2016   Other fatigue 06/26/2016   Other insomnia 06/26/2016   Arthralgia of both knees 06/26/2016   Pain in both feet 06/26/2016   Primary osteoarthritis of both feet 06/26/2016   Primary osteoarthritis of both knees 06/26/2016   DDD (degenerative disc disease), cervical 06/26/2016   DDD (degenerative disc disease), thoracic 06/26/2016   DDD (degenerative disc disease), lumbar 06/26/2016   History of hypertension 06/26/2016   History of high cholesterol 06/26/2016   Other sleep apnea 06/26/2016   History of osteopenia 06/26/2016   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   THYROID NODULE, LEFT 01/03/2008   HYPERLIPIDEMIA 01/03/2008   DEPRESSION 01/03/2008   MIGRAINE HEADACHE 01/03/2008   ALLERGIC RHINITIS 01/03/2008   ALOPECIA 01/03/2008   ALLERGIC ARTHRITIS SITE UNSPECIFIED  01/03/2008    Past Medical History:  Diagnosis Date   Diabetes mellitus without complication (HCC)    Fibromyalgia    High cholesterol    Hypertension    Idiopathic pulmonary fibrosis ([place])    Osteoporosis     Family History  Problem Relation Age of Onset   Hypertension Mother    Diabetes Mother    Osteoporosis Mother    Hypertension Father    Heart disease Father    COPD Father    Colon cancer Sister    Cancer Brother    Heart disease Brother    Emphysema Paternal Grandfather    Heart disease Brother    Past Surgical History:  Procedure Laterality Date   ABDOMINAL HYSTERECTOMY  2007   CATARACT EXTRACTION Bilateral    Social History   Social History Narrative   Not on file   Immunization History  Administered Date(s) Administered   Fluad Quad(high Dose 65+) 01/29/2019, 12/16/2019   Influenza Split 12/18/2017   Influenza-Unspecified 11/18/2013   PFIZER(Purple Top)SARS-COV-2 Vaccination 05/15/2019, 06/13/2019   Tdap 05/04/1999     Objective: Vital Signs: There were no vitals taken for this visit.   Physical Exam Vitals and nursing note reviewed.  Constitutional:      Appearance: She is well-developed.  HENT:     Head: Normocephalic and atraumatic.  Eyes:     Conjunctiva/sclera: Conjunctivae normal.  Pulmonary:     Effort: Pulmonary effort is normal.  Abdominal:     Palpations: Abdomen is soft.  Musculoskeletal:     Cervical back: Normal range of motion.  Skin:    General: Skin is warm and dry.     Capillary Refill: Capillary refill takes less than  2 seconds.  Neurological:     Mental Status: She is alert and oriented to person, place, and time.  Psychiatric:        Behavior: Behavior normal.     Musculoskeletal Exam:   CDAI Exam: CDAI Score: -- Patient Global: --; Provider Global: -- Swollen: --; Tender: -- Joint Exam 09/07/2020   No joint exam has been documented for this visit   There is currently no information documented on the  homunculus. Go to the Rheumatology activity and complete the homunculus joint exam.  Investigation: No additional findings.  Imaging: No results found.  Recent Labs: Lab Results  Component Value Date   WBC 10.5 03/15/2017   HGB 14.6 03/15/2017   PLT 439 (H) 03/15/2017   NA 140 03/15/2017   K 4.8 03/15/2017   CL 103 03/15/2017   CO2 31 03/15/2017   GLUCOSE 104 (H) 03/15/2017   BUN 12 03/15/2017   CREATININE 0.73 03/15/2017   BILITOT 0.5 05/31/2020   ALKPHOS 63 05/31/2020   AST 18 05/31/2020   ALT 17 05/31/2020   PROT 7.1 05/31/2020   ALBUMIN 3.7 05/31/2020   CALCIUM 10.0 03/15/2017   GFRAA 101 03/15/2017    Speciality Comments: No specialty comments available.  Procedures:  No procedures performed Allergies: Latex, Pseudoephedrine, Sulfa antibiotics, and Sulfonamide derivatives   Assessment / Plan:     Visit Diagnoses: No diagnosis found.  Orders: No orders of the defined types were placed in this encounter.  No orders of the defined types were placed in this encounter.     Follow-Up Instructions: No follow-ups on file.   Earnestine Mealing, CMA  Note - This record has been created using Editor, commissioning.  Chart creation errors have been sought, but may not always  have been located. Such creation errors do not reflect on  the standard of medical care.

## 2020-09-06 NOTE — Progress Notes (Deleted)
Office Visit Note  Patient: Nichole Reyes             Date of Birth: 04/05/1950           MRN: [id number]             PCP: Lanelle Bal, PA-C Referring: Rory Percy, MD Visit Date: 09/17/2020 Occupation: @GUAROCC @  Subjective:  No chief complaint on file.   History of Present Illness: Nichole Reyes is a 70 y.o. female ***   Activities of Daily Living:  Patient reports morning stiffness for *** {minute/hour:19697}.   Patient {ACTIONS;DENIES/REPORTS:21021675::"Denies"} nocturnal pain.  Difficulty dressing/grooming: {ACTIONS;DENIES/REPORTS:21021675::"Denies"} Difficulty climbing stairs: {ACTIONS;DENIES/REPORTS:21021675::"Denies"} Difficulty getting out of chair: {ACTIONS;DENIES/REPORTS:21021675::"Denies"} Difficulty using hands for taps, buttons, cutlery, and/or writing: {ACTIONS;DENIES/REPORTS:21021675::"Denies"}  No Rheumatology ROS completed.   PMFS History:  Patient Active Problem List   Diagnosis Date Noted   ILD (interstitial lung disease) ([place]) 08/26/2019   Fibromyalgia syndrome 06/26/2016   Other fatigue 06/26/2016   Other insomnia 06/26/2016   Arthralgia of both knees 06/26/2016   Pain in both feet 06/26/2016   Primary osteoarthritis of both feet 06/26/2016   Primary osteoarthritis of both knees 06/26/2016   DDD (degenerative disc disease), cervical 06/26/2016   DDD (degenerative disc disease), thoracic 06/26/2016   DDD (degenerative disc disease), lumbar 06/26/2016   History of hypertension 06/26/2016   History of high cholesterol 06/26/2016   Other sleep apnea 06/26/2016   History of osteopenia 06/26/2016   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   THYROID NODULE, LEFT 01/03/2008   HYPERLIPIDEMIA 01/03/2008   DEPRESSION 01/03/2008   MIGRAINE HEADACHE 01/03/2008   ALLERGIC RHINITIS 01/03/2008   ALOPECIA 01/03/2008   ALLERGIC ARTHRITIS SITE UNSPECIFIED 01/03/2008    Past Medical History:  Diagnosis Date   Diabetes mellitus without  complication (HCC)    Fibromyalgia    High cholesterol    Hypertension    Idiopathic pulmonary fibrosis ([place])    Osteoporosis     Family History  Problem Relation Age of Onset   Hypertension Mother    Diabetes Mother    Osteoporosis Mother    Hypertension Father    Heart disease Father    COPD Father    Colon cancer Sister    Cancer Brother    Heart disease Brother    Emphysema Paternal Grandfather    Heart disease Brother    Past Surgical History:  Procedure Laterality Date   ABDOMINAL HYSTERECTOMY  2007   CATARACT EXTRACTION Bilateral    Social History   Social History Narrative   Not on file   Immunization History  Administered Date(s) Administered   Fluad Quad(high Dose 65+) 01/29/2019, 12/16/2019   Influenza Split 12/18/2017   Influenza-Unspecified 11/18/2013   PFIZER(Purple Top)SARS-COV-2 Vaccination 05/15/2019, 06/13/2019   Tdap 05/04/1999     Objective: Vital Signs: There were no vitals taken for this visit.   Physical Exam   Musculoskeletal Exam:   CDAI Exam: CDAI Score: -- Patient Global: --; Provider Global: -- Swollen: --; Tender: -- Joint Exam 09/17/2020   No joint exam has been documented for this visit   There is currently no information documented on the homunculus. Go to the Rheumatology activity and complete the homunculus joint exam.  Investigation: No additional findings.  Imaging: No results found.  Recent Labs: Lab Results  Component Value Date   WBC 10.5 03/15/2017   HGB 14.6 03/15/2017   PLT 439 (H) 03/15/2017   NA 140 03/15/2017   K 4.8 03/15/2017  CL 103 03/15/2017   CO2 31 03/15/2017   GLUCOSE 104 (H) 03/15/2017   BUN 12 03/15/2017   CREATININE 0.73 03/15/2017   BILITOT 0.5 05/31/2020   ALKPHOS 63 05/31/2020   AST 18 05/31/2020   ALT 17 05/31/2020   PROT 7.1 05/31/2020   ALBUMIN 3.7 05/31/2020   CALCIUM 10.0 03/15/2017   GFRAA 101 03/15/2017    Speciality Comments: No specialty comments  available.  Procedures:  No procedures performed Allergies: Latex, Pseudoephedrine, Sulfa antibiotics, and Sulfonamide derivatives   Assessment / Plan:     Visit Diagnoses: No diagnosis found.  Orders: No orders of the defined types were placed in this encounter.  No orders of the defined types were placed in this encounter.   Face-to-face time spent with patient was *** minutes. Greater than 50% of time was spent in counseling and coordination of care.  Follow-Up Instructions: No follow-ups on file.   Earnestine Mealing, CMA  Note - This record has been created using Editor, commissioning.  Chart creation errors have been sought, but may not always  have been located. Such creation errors do not reflect on  the standard of medical care.

## 2020-09-07 ENCOUNTER — Ambulatory Visit: Payer: Medicare Other | Admitting: Physician Assistant

## 2020-09-07 ENCOUNTER — Other Ambulatory Visit: Payer: Self-pay | Admitting: Critical Care Medicine

## 2020-09-07 DIAGNOSIS — R0600 Dyspnea, unspecified: Secondary | ICD-10-CM

## 2020-09-07 DIAGNOSIS — Z8639 Personal history of other endocrine, nutritional and metabolic disease: Secondary | ICD-10-CM

## 2020-09-07 DIAGNOSIS — G8929 Other chronic pain: Secondary | ICD-10-CM

## 2020-09-07 DIAGNOSIS — R0609 Other forms of dyspnea: Secondary | ICD-10-CM

## 2020-09-07 DIAGNOSIS — M5030 Other cervical disc degeneration, unspecified cervical region: Secondary | ICD-10-CM

## 2020-09-07 DIAGNOSIS — M25561 Pain in right knee: Secondary | ICD-10-CM

## 2020-09-07 DIAGNOSIS — R5383 Other fatigue: Secondary | ICD-10-CM

## 2020-09-07 DIAGNOSIS — M170 Bilateral primary osteoarthritis of knee: Secondary | ICD-10-CM

## 2020-09-07 DIAGNOSIS — M5134 Other intervertebral disc degeneration, thoracic region: Secondary | ICD-10-CM

## 2020-09-07 DIAGNOSIS — Z8669 Personal history of other diseases of the nervous system and sense organs: Secondary | ICD-10-CM

## 2020-09-07 DIAGNOSIS — M797 Fibromyalgia: Secondary | ICD-10-CM

## 2020-09-07 DIAGNOSIS — M19071 Primary osteoarthritis, right ankle and foot: Secondary | ICD-10-CM

## 2020-09-07 DIAGNOSIS — J849 Interstitial pulmonary disease, unspecified: Secondary | ICD-10-CM

## 2020-09-07 DIAGNOSIS — G4709 Other insomnia: Secondary | ICD-10-CM

## 2020-09-07 DIAGNOSIS — K219 Gastro-esophageal reflux disease without esophagitis: Secondary | ICD-10-CM

## 2020-09-07 DIAGNOSIS — Z8659 Personal history of other mental and behavioral disorders: Secondary | ICD-10-CM

## 2020-09-07 DIAGNOSIS — M5136 Other intervertebral disc degeneration, lumbar region: Secondary | ICD-10-CM

## 2020-09-07 DIAGNOSIS — Z8679 Personal history of other diseases of the circulatory system: Secondary | ICD-10-CM

## 2020-09-14 ENCOUNTER — Encounter: Payer: Self-pay | Admitting: Cardiology

## 2020-09-14 ENCOUNTER — Ambulatory Visit: Payer: Medicare Other | Admitting: Cardiology

## 2020-09-14 ENCOUNTER — Other Ambulatory Visit: Payer: Self-pay

## 2020-09-14 VITALS — BP 130/76 | HR 86 | Ht 65.0 in | Wt 166.0 lb

## 2020-09-14 DIAGNOSIS — I2510 Atherosclerotic heart disease of native coronary artery without angina pectoris: Secondary | ICD-10-CM | POA: Diagnosis not present

## 2020-09-14 DIAGNOSIS — R0602 Shortness of breath: Secondary | ICD-10-CM

## 2020-09-14 DIAGNOSIS — Z789 Other specified health status: Secondary | ICD-10-CM

## 2020-09-14 DIAGNOSIS — E782 Mixed hyperlipidemia: Secondary | ICD-10-CM | POA: Diagnosis not present

## 2020-09-14 NOTE — Progress Notes (Signed)
Cardiology Office Note  Date: 09/14/2020   ID: Nichole Reyes, DOB [birth year], MRN [id number]  PCP:  Lanelle Bal, PA-C  Cardiologist:  Rozann Lesches, MD Electrophysiologist:  None   Chief Complaint  Patient presents with   Shortness of Breath     History of Present Illness: Nichole Reyes is a 71 y.o. female former patient of Dr. Bronson Ing referred back to the office by Dr. Chase Caller given incidental finding of coronary calcification by CT imaging of the chest.  She has a known history of IPF and follows regularly with Pulmonary.  She reports NYHA class II-III dyspnea depending on level of activity.  No frank angina or palpitations.  I reviewed her medications which are noted below.  Follow-up echocardiogram in April of this year revealed LVEF 60 to 65% with mild diastolic dysfunction, normal RV contraction and estimated RVSP, mildly sclerotic aortic valve without stenosis.  She did undergo a Myoview at [facility] in 2020 that showed no obvious ischemia.  I personally reviewed her ECG today which shows sinus rhythm with LVH and decreased R wave progression, PVC.  She has a history of multi-statin intolerance, currently on Zetia and Tricor.  Requesting most recent lab work from PCP.  Past Medical History:  Diagnosis Date   Coronary artery calcification seen on CT scan    Essential hypertension    Fibromyalgia    Idiopathic pulmonary fibrosis (HCC)    Mixed hyperlipidemia    Osteoporosis    Type 2 diabetes mellitus ([place])     Past Surgical History:  Procedure Laterality Date   ABDOMINAL HYSTERECTOMY  2007   CATARACT EXTRACTION Bilateral     Current Outpatient Medications  Medication Sig Dispense Refill   ACCU-CHEK AVIVA PLUS test strip      Accu-Chek Softclix Lancets lancets      albuterol (VENTOLIN HFA) 108 (90 Base) MCG/ACT inhaler 2 puffs every 4 (four) hours as needed.      aspirin EC 81 MG tablet Take 81 mg by mouth daily.     Calcium Carbonate  (CALTRATE 600 PO) Take 1 tablet by mouth 2 (two) times a day.     clonazePAM (KLONOPIN) 0.5 MG tablet Take 0.5 mg by mouth daily.      ESBRIET 267 MG TABS TAKE 3 TABLETS THREE TIMES A DAY WITH FOOD (Patient taking differently: Taking 1 large pill daily.  801mg ) 270 tablet 5   ezetimibe (ZETIA) 10 MG tablet Take 1 tablet (10 mg total) by mouth daily. 30 tablet 0   fenofibrate (TRICOR) 48 MG tablet Take 48 mg by mouth daily.      FLOVENT HFA 110 MCG/ACT inhaler INHALE 1 PUFF INTO THE LUNGS 2 TIMES A DAY. 12 g 3   LOSARTAN POTASSIUM PO Take by mouth.     metFORMIN (GLUCOPHAGE-XR) 500 MG 24 hr tablet Take 500 mg by mouth daily with breakfast.      metoprolol succinate (TOPROL-XL) 50 MG 24 hr tablet Take 50 mg by mouth daily.      omeprazole (PRILOSEC) 20 MG capsule TAKE 1 CAPSULE BY MOUTH ONCE DAILY. 30 capsule 5   SAVELLA 50 MG TABS tablet Take 50 mg by mouth 2 (two) times daily.      tiZANidine (ZANAFLEX) 4 MG tablet Take 1 tablet (4 mg total) by mouth at bedtime as needed for muscle spasms. 30 tablet 2   triamcinolone cream (KENALOG) 0.1 % SMARTSIG:1 Application Topical 2-3 Times Daily     No current facility-administered medications  for this visit.   Allergies:  Latex, Pseudoephedrine, Sulfa antibiotics, and Sulfonamide derivatives   Social History: The patient  reports that she has never smoked. She has never used smokeless tobacco. She reports previous alcohol use. She reports that she does not use drugs.   Family History: The patient's family history includes COPD in her father; Cancer in her brother; Colon cancer in her sister; Diabetes in her mother; Emphysema in her paternal grandfather; Heart disease in her brother, brother, and father; Hypertension in her father and mother; Osteoporosis in her mother.   ROS: No orthopnea or PND.  No syncope.  Physical Exam: VS:  BP 130/76 (BP Location: Right Arm)   Pulse 86   Ht 5\' 5"  (1.651 m)   Wt 166 lb (75.3 kg)   SpO2 96%   BMI 27.62 kg/m  , BMI Body mass index is 27.62 kg/m.  Wt Readings from Last 3 Encounters:  09/14/20 166 lb (75.3 kg)  07/13/20 167 lb 12.8 oz (76.1 kg)  05/06/20 169 lb (76.7 kg)    General: Patient appears comfortable at rest. HEENT: Conjunctiva and lids normal, wearing a mask. Neck: Supple, no elevated JVP or carotid bruits, no thyromegaly. Lungs: Coarse breath sounds without wheezing, nonlabored breathing at rest. Cardiac: Regular rate and rhythm, no S3 or significant systolic murmur, no pericardial rub. Abdomen: Soft, nontender, bowel sounds present. Extremities: No pitting edema, distal pulses 2+. Skin: Warm and dry. Musculoskeletal: No kyphosis. Neuropsychiatric: Alert and oriented x3, affect grossly appropriate.  ECG:  An ECG dated 09/11/2018 was personally reviewed today and demonstrated:  Poor quality tracing, sinus rhythm with increased voltage, poor R wave progression, left atrial enlargement.  Recent Labwork: 05/31/2020: ALT 17; AST 05 January 2019: BUN 12, creatinine 0.83, potassium 5.1, AST 19, ALT 27, hemoglobin 14.0, platelets 432, TSH 0.629, cholesterol 225, triglycerides 122, HDL 73, LDL 131, hemoglobin A1c 6.6%  Other Studies Reviewed Today:  Lexiscan Myoview 09/13/2018 [facility]): No diagnostic ST segment changes to indicate ischemia, no reversible perfusion defects to indicate ischemia, LVEF 59% with mild inferior septal hypokinesis  High-resolution chest CT 05/31/2020: IMPRESSION: 1. The appearance of the lungs is compatible with interstitial lung disease, with a spectrum of findings categorized as probable usual interstitial pneumonia (UIP) per current ATS guidelines. Repeat high-resolution chest CT is recommended in 12 months to assess for temporal changes in the appearance of the lung parenchyma. 2. Pulmonary nodules in the right lung are stable compared to the prior study from 2020, likely benign. Attention at time of repeat high-resolution chest CT is recommended  to ensure continued stability. 3. Aortic atherosclerosis, in addition to left main and 3 vessel coronary artery disease. Please note that although the presence of coronary artery calcium documents the presence of coronary artery disease, the severity of this disease and any potential stenosis cannot be assessed on this non-gated CT examination. Assessment for potential risk factor modification, dietary therapy or pharmacologic therapy may be warranted, if clinically indicated. 4. There are calcifications of the aortic valve. Echocardiographic correlation for evaluation of potential valvular dysfunction may be warranted if clinically indicated.   Aortic Atherosclerosis (ICD10-I70.0).  Echocardiogram 06/29/2020:  1. Left ventricular ejection fraction, by estimation, is 60 to 65%. The  left ventricle has normal function. The left ventricle has no regional  wall motion abnormalities. There is mild left ventricular hypertrophy.  Left ventricular diastolic parameters  are consistent with Grade I diastolic dysfunction (impaired relaxation).   2. Right ventricular systolic function is normal.  The right ventricular  size is normal. There is normal pulmonary artery systolic pressure. The  estimated right ventricular systolic pressure is 03.0 mmHg.   3. The mitral valve is grossly normal. Trivial mitral valve  regurgitation.   4. The aortic valve is tricuspid. Aortic valve regurgitation is not  visualized. Mild aortic valve sclerosis is present, with no evidence of  aortic valve stenosis.   5. The inferior vena cava is normal in size with greater than 50%  respiratory variability, suggesting right atrial pressure of 3 mmHg.   Assessment and Plan:  1.  Multivessel coronary artery calcification by chest CT in March.  She has chronic dyspnea on exertion in the setting of IPF, no definite angina.  ECG reviewed and nonspecific.  Recent echocardiogram to find normal LVEF and shows evidence of only  mild aortic sclerosis without stenosis.  Plan is to obtain a Lexiscan Myoview to evaluate ischemic burden.  For now continue aspirin, Zetia, and Tricor.  2.  Mixed hyperlipidemia with history of multi-statin intolerance.  Currently on Zetia and Tricor.  Requesting interval lipids from PCP.  Discussed referral to lipid clinic potentially for evaluation of PCSK9 inhibitor and other treatment options.  3.  Idiopathic pulmonary fibrosis, followed by Pulmonary.  Medication Adjustments/Labs and Tests Ordered: Current medicines are reviewed at length with the patient today.  Concerns regarding medicines are outlined above.   Tests Ordered: Orders Placed This Encounter  Procedures   NM Myocar Multi W/Spect W/Wall Motion / EF   EKG 12-Lead     Medication Changes: No orders of the defined types were placed in this encounter.   Disposition:  Follow up  test results.  Signed, Satira Sark, MD, [facility] 09/14/2020 9:31 AM    [facility] [place]. [address], [place], [place] [address] Phone: [phone]; Fax: [phone]

## 2020-09-14 NOTE — Patient Instructions (Signed)
Medication Instructions:  Your physician recommends that you continue on your current medications as directed. Please refer to the Current Medication list given to you today.  *If you need a refill on your cardiac medications before your next appointment, please call your pharmacy*   Lab Work: None today  If you have labs (blood work) drawn today and your tests are completely normal, you will receive your results only by: [place] (if you have MyChart) OR A paper copy in the mail If you have any lab test that is abnormal or we need to change your treatment, we will call you to review the results.   Testing/Procedures: Your physician has requested that you have a lexiscan myoview. For further information please visit [email or web address]. Please follow instruction sheet, as given.    Follow-Up: At [facility], you and your health needs are our priority.  As part of our continuing mission to provide you with exceptional heart care, we have created designated Provider Care Teams.  These Care Teams include your primary Cardiologist (physician) and Advanced Practice Providers (APPs -  Physician Assistants and Nurse Practitioners) who all work together to provide you with the care you need, when you need it.  We recommend signing up for the patient portal called "MyChart".  Sign up information is provided on this After Visit Summary.  MyChart is used to connect with patients for Virtual Visits (Telemedicine).  Patients are able to view lab/test results, encounter notes, upcoming appointments, etc.  Non-urgent messages can be sent to your provider as well.   To learn more about what you can do with MyChart, go to [email or web address].    Your next appointment:  Follow up to be determined after lexiscan results.         Thank you for choosing [facility] !

## 2020-09-17 ENCOUNTER — Ambulatory Visit: Payer: Medicare Other | Admitting: Physician Assistant

## 2020-09-24 ENCOUNTER — Encounter (HOSPITAL_COMMUNITY): Payer: Self-pay

## 2020-09-24 ENCOUNTER — Ambulatory Visit (HOSPITAL_COMMUNITY)
Admission: RE | Admit: 2020-09-24 | Discharge: 2020-09-24 | Disposition: A | Discharge status: Home / Self Care | Payer: Medicare Other | Source: Ambulatory Visit | Attending: Cardiology | Admitting: Cardiology

## 2020-09-24 ENCOUNTER — Other Ambulatory Visit: Payer: Self-pay

## 2020-09-24 ENCOUNTER — Encounter (HOSPITAL_COMMUNITY)
Admission: RE | Admit: 2020-09-24 | Discharge: 2020-09-24 | Disposition: A | Discharge status: Home / Self Care | Payer: Medicare Other | Source: Ambulatory Visit | Attending: Cardiology | Admitting: Cardiology

## 2020-09-24 DIAGNOSIS — I2510 Atherosclerotic heart disease of native coronary artery without angina pectoris: Secondary | ICD-10-CM | POA: Insufficient documentation

## 2020-09-24 DIAGNOSIS — R0602 Shortness of breath: Secondary | ICD-10-CM | POA: Diagnosis not present

## 2020-09-24 LAB — NM MYOCAR MULTI W/SPECT W/WALL MOTION / EF
LV dias vol: 65 mL (ref 46–106)
LV sys vol: 21 mL
Peak HR: 101 {beats}/min
RATE: 0.36
Rest HR: 78 {beats}/min
SDS: 1
SRS: 1
SSS: 2
TID: 1.11

## 2020-09-24 MED ORDER — TECHNETIUM TC 99M TETROFOSMIN IV KIT
30.0000 | PACK | Freq: Once | INTRAVENOUS | Status: AC | PRN
  Administered 2020-09-24: 31 via INTRAVENOUS

## 2020-09-24 MED ORDER — REGADENOSON 0.4 MG/5ML IV SOLN
INTRAVENOUS | Status: AC
  Administered 2020-09-24: 0.4 mg via INTRAVENOUS
  Filled 2020-09-24: qty 5

## 2020-09-24 MED ORDER — SODIUM CHLORIDE FLUSH 0.9 % IV SOLN
INTRAVENOUS | Status: AC
  Administered 2020-09-24: 10 mL via INTRAVENOUS
  Filled 2020-09-24: qty 10

## 2020-09-24 MED ORDER — TECHNETIUM TC 99M TETROFOSMIN IV KIT
10.0000 | PACK | Freq: Once | INTRAVENOUS | Status: AC | PRN
  Administered 2020-09-24: 10.7 via INTRAVENOUS

## 2020-10-01 NOTE — Progress Notes (Deleted)
Office Visit Note  Patient: Nichole Reyes             Date of Birth: Mar 04, 1951           MRN: [id number]             PCP: Lanelle Bal, PA-C Referring: Lanelle Bal, PA-C Visit Date: 10/06/2020 Occupation: @GUAROCC @  Subjective:  No chief complaint on file.   History of Present Illness: Nichole Reyes is a 70 y.o. female ***   Activities of Daily Living:  Patient reports morning stiffness for *** {minute/hour:19697}.   Patient {ACTIONS;DENIES/REPORTS:21021675::"Denies"} nocturnal pain.  Difficulty dressing/grooming: {ACTIONS;DENIES/REPORTS:21021675::"Denies"} Difficulty climbing stairs: {ACTIONS;DENIES/REPORTS:21021675::"Denies"} Difficulty getting out of chair: {ACTIONS;DENIES/REPORTS:21021675::"Denies"} Difficulty using hands for taps, buttons, cutlery, and/or writing: {ACTIONS;DENIES/REPORTS:21021675::"Denies"}  No Rheumatology ROS completed.   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   DDD (degenerative disc disease), lumbar 06/26/2016   History of hypertension 06/26/2016   History of high cholesterol 06/26/2016   Other sleep apnea 06/26/2016   History of osteopenia 06/26/2016   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   THYROID NODULE, LEFT 01/03/2008   HYPERLIPIDEMIA 01/03/2008   DEPRESSION 01/03/2008   MIGRAINE HEADACHE 01/03/2008   ALLERGIC RHINITIS 01/03/2008   ALOPECIA 01/03/2008   ALLERGIC ARTHRITIS SITE UNSPECIFIED 01/03/2008    Past Medical History:  Diagnosis Date   Coronary artery calcification  seen on CT scan    Essential hypertension    Fibromyalgia    Idiopathic pulmonary fibrosis (HCC)    Mixed hyperlipidemia    Osteoporosis    Type 2 diabetes mellitus ([place])     Family History  Problem Relation Age of Onset   Hypertension Mother    Diabetes Mother    Osteoporosis Mother    Hypertension Father    Heart disease Father    COPD Father    Colon cancer Sister    Cancer Brother    Heart disease Brother    Emphysema Paternal Grandfather    Heart disease Brother    Past Surgical History:  Procedure Laterality Date   ABDOMINAL HYSTERECTOMY  2007   CATARACT EXTRACTION Bilateral    Social History   Social History Narrative   Not on file   Immunization History  Administered Date(s) Administered   Fluad Quad(high Dose 65+) 01/29/2019, 12/16/2019   Influenza Split 12/18/2017   Influenza-Unspecified 11/18/2013   PFIZER(Purple Top)SARS-COV-2 Vaccination 05/15/2019, 06/13/2019   Tdap 05/04/1999     Objective: Vital Signs: There were no vitals taken for this visit.   Physical Exam   Musculoskeletal Exam: ***  CDAI Exam: CDAI Score: -- Patient Global: --; Provider Global: -- Swollen: --; Tender: -- Joint Exam 10/06/2020   No joint exam has been documented for this visit   There is currently no information documented on the homunculus. Go to the Rheumatology activity and complete the homunculus joint exam.  Investigation: No additional findings.  Imaging: NM Myocar Multi W/Spect W/Wall Motion / EF  Result Date: 09/24/2020  Lexiscan stress is electrically negative for ischemia  Myoview scan shows normal perfusion No ischemia or  scar  LVEF calculated at 68%  This is a low risk study.     Recent Labs: Lab Results  Component Value Date   WBC 10.5 03/15/2017   HGB 14.6 03/15/2017   PLT 439 (H) 03/15/2017   NA 140 03/15/2017   K 4.8 03/15/2017   CL 103 03/15/2017   CO2 31 03/15/2017   GLUCOSE 104 (H) 03/15/2017   BUN 12 03/15/2017   CREATININE 0.73  03/15/2017   BILITOT 0.5 05/31/2020   ALKPHOS 63 05/31/2020   AST 18 05/31/2020   ALT 17 05/31/2020   PROT 7.1 05/31/2020   ALBUMIN 3.7 05/31/2020   CALCIUM 10.0 03/15/2017   GFRAA 101 03/15/2017    Speciality Comments: No specialty comments available.  Procedures:  No procedures performed Allergies: Latex, Pseudoephedrine, Sulfa antibiotics, and Sulfonamide derivatives   Assessment / Plan:     Visit Diagnoses: No diagnosis found.  Orders: No orders of the defined types were placed in this encounter.  No orders of the defined types were placed in this encounter.   Face-to-face time spent with patient was *** minutes. Greater than 50% of time was spent in counseling and coordination of care.  Follow-Up Instructions: No follow-ups on file.   Earnestine Mealing, CMA  Note - This record has been created using Editor, commissioning.  Chart creation errors have been sought, but may not always  have been located. Such creation errors do not reflect on  the standard of medical care.

## 2020-10-06 ENCOUNTER — Ambulatory Visit: Payer: Medicare Other | Admitting: Physician Assistant

## 2020-10-08 NOTE — Progress Notes (Signed)
Office Visit Note  Patient: Nichole Reyes             Date of Birth: 11-04-1950           MRN: [id number]             PCP: Lanelle Bal, PA-C Referring: Lanelle Bal, PA-C Visit Date: 10/22/2020 Occupation: '@GUAROCC'$ @  Subjective:  Pain in both feet    History of Present Illness: Nichole Reyes is a 70 y.o. female with history of fibromyalgia, osteoarthritis, and DDD.  Patient reports that she continues to have intermittent myalgias and muscle tenderness due to fibromyalgia.  She takes zanaflex 4 mg at bedtime for muscle spasms. She states that she fell about 1 month ago).  She was evaluated at her PCPs office and had x-rays at that time which did not reveal any fractures.  She continues to have persistent discomfort and swelling in her right foot and was prescribed a prednisone taper which she completed several days ago.  The prednisone taper has not improved her symptoms and she continues to have tenderness and swelling on the lateral aspect of her right foot.  She has been taking Tylenol as needed for pain relief.  She states that over the past several months she has been experiencing a throbbing sensation in both feet at night.  She denies any pins-and-needles sensation in her feet at this time.  She has not noticed any joint swelling in her toes.  She states that her PCP previously told her that she had early signs of neuropathy in her feet.  She has not had any further work-up.   She continues to take a calcium and vitamin D supplement on a daily basis.   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 Chase Caller.  She has been taking Esbriet as prescribed and states her condition has been stable.   Activities of Daily Living:  Patient reports morning stiffness for 2 minutes.   Patient Denies nocturnal pain.  Difficulty dressing/grooming: Denies Difficulty climbing stairs: Denies Difficulty getting out of chair: Denies Difficulty using hands for  taps, buttons, cutlery, and/or writing: Denies  Review of Systems  Constitutional:  Positive for fatigue.  HENT:  Negative for mouth sores, mouth dryness and nose dryness.   Eyes:  Negative for pain, visual disturbance and dryness.  Respiratory:  Negative for cough, hemoptysis, shortness of breath and difficulty breathing.   Cardiovascular:  Negative for chest pain, palpitations, hypertension and swelling in legs/feet.  Gastrointestinal:  Negative for blood in stool, constipation and diarrhea.  Endocrine: Negative for excessive thirst and increased urination.  Genitourinary:  Negative for difficulty urinating and painful urination.  Musculoskeletal:  Positive for joint pain, joint pain, joint swelling and morning stiffness. Negative for myalgias, muscle weakness, muscle tenderness and myalgias.  Skin:  Negative for color change, pallor, rash, hair loss, nodules/bumps, skin tightness, ulcers and sensitivity to sunlight.  Allergic/Immunologic: Negative for susceptible to infections.  Neurological:  Negative for dizziness, numbness, headaches and weakness.  Hematological:  Negative for bruising/bleeding tendency and swollen glands.  Psychiatric/Behavioral:  Positive for sleep disturbance. Negative for depressed mood. The patient is not nervous/anxious.    PMFS History:  Patient Active Problem List   Diagnosis Date Noted   ILD (interstitial lung disease) ([place]) 08/26/2019   Fibromyalgia syndrome 06/26/2016   Other fatigue 06/26/2016   Other insomnia 06/26/2016   Arthralgia of both knees 06/26/2016   Pain in both feet 06/26/2016  Primary osteoarthritis of both feet 06/26/2016   Primary osteoarthritis of both knees 06/26/2016   DDD (degenerative disc disease), cervical 06/26/2016   DDD (degenerative disc disease), thoracic 06/26/2016   DDD (degenerative disc disease), lumbar 06/26/2016   History of hypertension 06/26/2016   History of high cholesterol 06/26/2016   Other sleep apnea  06/26/2016   History of osteopenia 06/26/2016   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   THYROID NODULE, LEFT 01/03/2008   HYPERLIPIDEMIA 01/03/2008   DEPRESSION 01/03/2008   MIGRAINE HEADACHE 01/03/2008   ALLERGIC RHINITIS 01/03/2008   ALOPECIA 01/03/2008   ALLERGIC ARTHRITIS SITE UNSPECIFIED 01/03/2008    Past Medical History:  Diagnosis Date   Coronary artery calcification seen on CT scan    Essential hypertension    Fibromyalgia    Idiopathic pulmonary fibrosis (HCC)    Mixed hyperlipidemia    Osteoporosis    Type 2 diabetes mellitus ([place])     Family History  Problem Relation Age of Onset   Hypertension Mother    Diabetes Mother    Osteoporosis Mother    Hypertension Father    Heart disease Father    COPD Father    Colon cancer Sister    Cancer Brother    Heart disease Brother    Heart disease Brother    Emphysema Paternal Grandfather    Past Surgical History:  Procedure Laterality Date   ABDOMINAL HYSTERECTOMY  2007   CATARACT EXTRACTION Bilateral    Social History   Social History Narrative   Not on file   Immunization History  Administered Date(s) Administered   Fluad Quad(high Dose 65+) 01/29/2019, 12/16/2019   Influenza Split 12/18/2017   Influenza-Unspecified 11/18/2013   PFIZER(Purple Top)SARS-COV-2 Vaccination 05/15/2019, 06/13/2019   Tdap 05/04/1999     Objective: Vital Signs: BP 107/67 (BP Location: Left Arm, Patient Position: Sitting, Cuff Size: Normal)   Pulse 89   Resp 15   Ht '5\' 5"'$  (1.651 m)   Wt 166 lb 6.4 oz (75.5 kg)   BMI 27.69 kg/m    Physical Exam Vitals and nursing note reviewed.  Constitutional:      Appearance: She is well-developed.  HENT:     Head: Normocephalic and atraumatic.  Eyes:     Conjunctiva/sclera: Conjunctivae normal.  Pulmonary:     Effort: Pulmonary effort is normal.  Abdominal:     Palpations: Abdomen is soft.  Musculoskeletal:     Cervical back: Normal range of motion.  Skin:    General:  Skin is warm and dry.     Capillary Refill: Capillary refill takes less than 2 seconds.  Neurological:     Mental Status: She is alert and oriented to person, place, and time.  Psychiatric:        Behavior: Behavior normal.     Musculoskeletal Exam: C-spine has slightly limited ROM.  Postural thoracic kyphosis noted.  Shoulder joints, elbow joints, wrist joints, MCPs, PIPs, and DIPs good ROM with no discomfort.  PIP and DIP thickening consistent with osteoarthritis of both hands.  Hip joints have good ROM with no discomfort. Knee joints have good ROM with no discomfort.  No warmth or effusion of knee joints.  Ankle joints have good ROM with no tenderness or joint swelling.  Tenderness and swelling over the base of the right 4th metatarsal. No tenderness over MTP joints.    CDAI Exam: CDAI Score: -- Patient Global: --; Provider Global: -- Swollen: --; Tender: -- Joint Exam 10/22/2020   No joint exam  has been documented for this visit   There is currently no information documented on the homunculus. Go to the Rheumatology activity and complete the homunculus joint exam.  Investigation: No additional findings.  Imaging: NM Myocar Multi W/Spect W/Wall Motion / EF  Result Date: 09/24/2020  Lexiscan stress is electrically negative for ischemia  Myoview scan shows normal perfusion No ischemia or scar  LVEF calculated at 68%  This is a low risk study.     Recent Labs: Lab Results  Component Value Date   WBC 10.5 03/15/2017   HGB 14.6 03/15/2017   PLT 439 (H) 03/15/2017   NA 140 03/15/2017   K 4.8 03/15/2017   CL 103 03/15/2017   CO2 31 03/15/2017   GLUCOSE 104 (H) 03/15/2017   BUN 12 03/15/2017   CREATININE 0.73 03/15/2017   BILITOT 0.5 05/31/2020   ALKPHOS 63 05/31/2020   AST 18 05/31/2020   ALT 17 05/31/2020   PROT 7.1 05/31/2020   ALBUMIN 3.7 05/31/2020   CALCIUM 10.0 03/15/2017   GFRAA 101 03/15/2017    Speciality Comments: No specialty comments  available.  Procedures:  No procedures performed Allergies: Latex, Pseudoephedrine, Sulfa antibiotics, and Sulfonamide derivatives   Assessment / Plan:     Visit Diagnoses: Fibromyalgia: She experiences intermittent myalgias and muscle tenderness due to underlying fibromyalgia.  She had a fall 1 month ago and has been having increased generalized myalgias and muscle tenderness since then.  She takes tizanidine 4 mg at bedtime for muscle spasms.  No muscle weakness was noted on exam. We discussed the importance of regular exercise and good sleep hygiene.  She will follow-up in the office in 6 months.  Other insomnia: She takes zanaflex 4 mg at bedtime for muscle spasms and insomnia. Discussed the importance of regular exercise and good sleep hygiene.   Other fatigue: Chronic and secondary to insomnia.   ILD (interstitial lung disease) ([place]): Dyspnea started summer 2020.  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  Started on Esbriet 05/22/19.  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  Echocardiogram on 06/29/20: no evidence of pulmonary hypertension.  Clinically stable.  No progression apparent on CT from 05/18/20.  She has been following up with Dr. Chase Caller closely.  She remains on Esbriet as prescribed.   She has a family history of rheumatoid arthritis, but she is not exhibiting any features of rheumatoid arthritis.  No clinical features of autoimmune disease currently. AVISE lab work will be obtained to complete the workup.    Family history of rheumatoid arthritis - Maternal grandmother.  The patient is not exhibiting any features of rheumatoid arthritis.   Primary osteoarthritis of both knees: She has good ROM of both knee joints.  No warmth or effusion of knee joints.  She had updated x-rays of the right knee on 09/05/19: consistent with mild  OA. She had a right knee joint cortisone injection on 03/09/20, which alleviated her pain.   Primary osteoarthritis of both feet:She has PIP and DIP thickening consistent with OA of both feet.  No tenderness or synovitis over MTP joints.  She has been experiencing a throbbing sensation intermittently at night. She was diagnosed with early neuropathy by her PCP according to the patient.   She presents today with ongoing pain on the lateral aspect of right foot after a fall 1 month ago.  She initially  had x-rays at her PCPs office which did not reveal a fracture.  She continued to have persistent discomfort and swelling, so she was prescribed a prednisone taper which she completed several days ago.  She continues to have point tenderness and swelling over the base of the right 4th metatarsal.  Updated x-rays obtained today for further evaluation.    DDD (degenerative disc disease), cervical: C-spine has limited ROM with lateral rotation. No symptoms of radiculopathy.   DDD (degenerative disc disease), thoracic: Thoracic kyphosis noted.   DDD (degenerative disc disease), lumbar: She is not experiencing any discomfort in her lower back at this time.   Osteopenia of multiple sites: DEXA updated on 08/12/19: L1-L4 BMD 0.813 with T-score -2.1. Thoracic kyphosis noted. No history of fractures. She has been taking a calcium and vitamin D supplement on a daily basis. She will be due to update DEXA in May 2023.  She fell 1 month ago and twisted her right foot.  She had x-rays at her PCPs office after the fall and did not have a fracture at that time.  She continues to have persistent discomfort so repeat x-rays were ordered today. F  Pain in right foot: She fell 1 month ago and twisted her right foot.  She was evaluated by her PCP who obtained x-rays which did not reveal acute fracture at that time.  She has had persistent discomfort and was prescribed a prednisone taper which she completed several days ago.  She  continues to have point tenderness and swelling at the base of the right fourth metatarsal.  Updated x-rays were obtained today.  Other medical conditions are listed as follows:   History of migraine  History of hypertension: BP was 107/67 today.   History of depression  History of hypercholesterolemia  History of sleep apnea  Orders: No orders of the defined types were placed in this encounter.  No orders of the defined types were placed in this encounter.   Follow-Up Instructions: Return in about 6 months (around 04/24/2021) for Fibromyalgia, Osteoarthritis.   Ofilia Neas, PA-C  Note - This record has been created using Dragon software.  Chart creation errors have been sought, but may not always  have been located. Such creation errors do not reflect on  the standard of medical care.

## 2020-10-15 ENCOUNTER — Ambulatory Visit: Payer: Medicare Other | Admitting: Physician Assistant

## 2020-10-21 ENCOUNTER — Other Ambulatory Visit: Payer: Self-pay | Admitting: Physician Assistant

## 2020-10-21 NOTE — Telephone Encounter (Signed)
Next Visit: 10/22/2020   Last Visit: 03/09/2020   Last Fill: 27/2022    Dx: Acute pain of right knee   Current Dose per office note on 03/09/2020, not mentioned    Okay to refill Zanaflex?

## 2020-10-22 ENCOUNTER — Other Ambulatory Visit: Payer: Self-pay

## 2020-10-22 ENCOUNTER — Ambulatory Visit: Payer: Self-pay

## 2020-10-22 ENCOUNTER — Ambulatory Visit: Payer: Medicare Other | Admitting: Physician Assistant

## 2020-10-22 ENCOUNTER — Encounter: Payer: Self-pay | Admitting: Physician Assistant

## 2020-10-22 VITALS — BP 107/67 | HR 89 | Resp 15 | Ht 65.0 in | Wt 166.4 lb

## 2020-10-22 DIAGNOSIS — M5030 Other cervical disc degeneration, unspecified cervical region: Secondary | ICD-10-CM

## 2020-10-22 DIAGNOSIS — R5383 Other fatigue: Secondary | ICD-10-CM

## 2020-10-22 DIAGNOSIS — M79671 Pain in right foot: Secondary | ICD-10-CM

## 2020-10-22 DIAGNOSIS — Z8659 Personal history of other mental and behavioral disorders: Secondary | ICD-10-CM

## 2020-10-22 DIAGNOSIS — M797 Fibromyalgia: Secondary | ICD-10-CM

## 2020-10-22 DIAGNOSIS — J849 Interstitial pulmonary disease, unspecified: Secondary | ICD-10-CM

## 2020-10-22 DIAGNOSIS — M19072 Primary osteoarthritis, left ankle and foot: Secondary | ICD-10-CM

## 2020-10-22 DIAGNOSIS — M5136 Other intervertebral disc degeneration, lumbar region: Secondary | ICD-10-CM

## 2020-10-22 DIAGNOSIS — Z8679 Personal history of other diseases of the circulatory system: Secondary | ICD-10-CM

## 2020-10-22 DIAGNOSIS — G4709 Other insomnia: Secondary | ICD-10-CM | POA: Diagnosis not present

## 2020-10-22 DIAGNOSIS — M170 Bilateral primary osteoarthritis of knee: Secondary | ICD-10-CM

## 2020-10-22 DIAGNOSIS — M8589 Other specified disorders of bone density and structure, multiple sites: Secondary | ICD-10-CM

## 2020-10-22 DIAGNOSIS — Z8261 Family history of arthritis: Secondary | ICD-10-CM

## 2020-10-22 DIAGNOSIS — Z8639 Personal history of other endocrine, nutritional and metabolic disease: Secondary | ICD-10-CM

## 2020-10-22 DIAGNOSIS — M19071 Primary osteoarthritis, right ankle and foot: Secondary | ICD-10-CM

## 2020-10-22 DIAGNOSIS — M5134 Other intervertebral disc degeneration, thoracic region: Secondary | ICD-10-CM

## 2020-10-22 DIAGNOSIS — Z8669 Personal history of other diseases of the nervous system and sense organs: Secondary | ICD-10-CM

## 2020-11-01 ENCOUNTER — Ambulatory Visit: Payer: Medicare Other | Admitting: Internal Medicine

## 2020-11-09 ENCOUNTER — Other Ambulatory Visit: Payer: Self-pay | Admitting: Critical Care Medicine

## 2020-11-09 DIAGNOSIS — J849 Interstitial pulmonary disease, unspecified: Secondary | ICD-10-CM

## 2020-11-09 DIAGNOSIS — R0609 Other forms of dyspnea: Secondary | ICD-10-CM

## 2020-11-09 DIAGNOSIS — R0600 Dyspnea, unspecified: Secondary | ICD-10-CM

## 2020-11-27 ENCOUNTER — Other Ambulatory Visit: Payer: Self-pay | Admitting: Physician Assistant

## 2020-11-29 NOTE — Telephone Encounter (Signed)
Next Visit: 04/28/2020  Last Visit: 10/22/2020  Last Fill: 10/21/2020  Dx:  Fibromyalgia  Current Dose per office note on 10/22/2020: zanaflex 4 mg at bedtime for muscle spasms   Okay to refill Zanaflex?

## 2020-12-16 ENCOUNTER — Ambulatory Visit: Payer: Medicare Other | Admitting: Internal Medicine

## 2021-01-24 ENCOUNTER — Encounter: Payer: Self-pay | Admitting: Internal Medicine

## 2021-01-24 ENCOUNTER — Ambulatory Visit (INDEPENDENT_AMBULATORY_CARE_PROVIDER_SITE_OTHER): Payer: Medicare Other | Admitting: Internal Medicine

## 2021-01-24 ENCOUNTER — Ambulatory Visit: Payer: Medicare Other | Admitting: Internal Medicine

## 2021-01-24 ENCOUNTER — Other Ambulatory Visit: Payer: Self-pay

## 2021-01-24 VITALS — BP 126/68 | HR 90 | Temp 97.9°F | Ht 65.0 in | Wt 164.0 lb

## 2021-01-24 DIAGNOSIS — J84112 Idiopathic pulmonary fibrosis: Secondary | ICD-10-CM | POA: Diagnosis not present

## 2021-01-24 DIAGNOSIS — Z7185 Encounter for immunization safety counseling: Secondary | ICD-10-CM | POA: Diagnosis not present

## 2021-01-24 DIAGNOSIS — Z5181 Encounter for therapeutic drug level monitoring: Secondary | ICD-10-CM

## 2021-01-24 DIAGNOSIS — R634 Abnormal weight loss: Secondary | ICD-10-CM | POA: Diagnosis not present

## 2021-01-24 DIAGNOSIS — Z23 Encounter for immunization: Secondary | ICD-10-CM

## 2021-01-24 DIAGNOSIS — T50905A Adverse effect of unspecified drugs, medicaments and biological substances, initial encounter: Secondary | ICD-10-CM

## 2021-01-24 LAB — PULMONARY FUNCTION TEST
DL/VA % pred: 114 %
DL/VA: 4.71 ml/min/mmHg/L
DLCO cor % pred: 72 %
DLCO cor: 14.63 ml/min/mmHg
DLCO unc % pred: 72 %
DLCO unc: 14.63 ml/min/mmHg
FEF 25-75 Pre: 2.27 L/sec
FEF2575-%Pred-Pre: 110 %
FEV1-%Pred-Pre: 77 %
FEV1-Pre: 1.88 L
FEV1FVC-%Pred-Pre: 116 %
FEV6-%Pred-Pre: 69 %
FEV6-Pre: 2.12 L
FEV6FVC-%Pred-Pre: 104 %
FVC-%Pred-Pre: 66 %
FVC-Pre: 2.12 L
Pre FEV1/FVC ratio: 89 %
Pre FEV6/FVC Ratio: 100 %

## 2021-01-24 LAB — HEPATIC FUNCTION PANEL
ALT: 15 U/L (ref 0–35)
AST: 22 U/L (ref 0–37)
Albumin: 4.1 g/dL (ref 3.5–5.2)
Alkaline Phosphatase: 72 U/L (ref 39–117)
Bilirubin, Direct: 0 mg/dL (ref 0.0–0.3)
Total Bilirubin: 0.3 mg/dL (ref 0.2–1.2)
Total Protein: 6.9 g/dL (ref 6.0–8.3)

## 2021-01-24 NOTE — Progress Notes (Signed)
Spirometry/DLCO performed today. 

## 2021-01-24 NOTE — Patient Instructions (Addendum)
IPF (idiopathic pulmonary fibrosis) (HCC) Medication monitoring encounter Gastroesophageal reflux disease, unspecified whether esophagitis present Dyspnea on exertion  -IPF 7% worse sept 2020 -> Nov 2022 (this very mild decline) -You are having  weight loss in association with pirfenidone  Plan - check LFT 01/24/2021  - high dose flu shot 01/24/2021 -Continue acid reflux medications -Continue pirfenidone per schedule [side effect issues of weight loss discussed]  -At this point in time benefit outweighs risk  -Continue space between meals and apply sunscreen when going out -We discussed clinical trials as a care option and appreciate your interest  -We can look at prescreen for potential research protocols in the future  -Do repeat spirometry and DLCO in 4 months  Drug-induced weight loss  -15 pound weight loss but in February 2021 and Nov  2022 most likely because of pirfenidone -At this point in time is still are technically overweight -Therefore we will leverage pirfenidone and helping you with your weight loss -We will continue to monitor -Do talk to primary care physician about other reasons for weight loss  Flu vaccine need  Plan  - high dose flu shot 01/24/2021  Knee Arhtiritis  Hindering ability to do pulmonary rehab   Plan  - d/w rheum if is time for knee rplacement evaluation - k   Followup -Return to see Dr. Chase Caller in 4 months   after repeat spirometry and DLCO  -ILD symptom score and simple walk test at follow-up

## 2021-01-24 NOTE — Progress Notes (Signed)
OV 11/19/2018: Nichole Reyes is a 70 year old woman referred for evaluation of dyspnea for the past several months.  In the same time interval she is also noticed fatigue.  She has previously undergone a negative cardiac evaluation and an outside CT scan of her chest.  Per report, the CT demonstrates fibrotic changes. She is a never- smoker.  She has previously been evaluated by rheumatology, Dr. Estanislado Pandy, for fibromyalgia and osteoarthritis.  She began to notice dyspnea on exertion when exercising in February 2020.  She had previously been able to walk 35 to 40 minutes, but decreased to about 20 minutes at a time due to dyspnea.  She was hospitalized in [place] in June 2020 for dyspnea. At that time she was COVID negative.  She was discharged on albuterol, which she has been using up to 4 times a day, which improves her symptoms.  There is no family history of fibrotic lung disease; multiple family members had COPD.  Her maternal grandmother had rheumatoid arthritis, but otherwise there is no family history of rheumatologic disease.  In addition to dyspnea on exertion with occasional dyspnea at rest she endorses cough and sputum production.  She denies wheezing, postnasal drip, coughing or choking when eating, Raynaud's, muscle weakness, rashes, dry eyes or mouth, joint swelling, prolonged morning stiffness.  She has occasional hand stiffness throughout the day, not associated with rest.  She has a hiatal hernia with occasional GERD with symptoms less than once a week, associated with certain foods.  She is a never smoker or vaper.  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  She has never worked in [employer] or shipyards.  She does not have any dusty hobbies.  She has a Programmer, systems, but has never had pet birds.  She does not regularly use hot tubs.  She has never had exposure to medical radiation, methotrexate,  Macrobid, amiodarone, bleomycin.  Her only new medication was Zetia last month for hypertriglyceridemia.  Otherwise she has not had medication changes in greater than a year.  Last month she has had 6 episodes of epistaxis, which has improved with use of intranasal steroids prescribed by her PCP.     OV 05/06/2019  Subjective:  Patient ID: Nichole Reyes, female , DOB: 1950-07-01 , age 66 y.o. , MRN: [id number] , ADDRESS: [place]  [id number]   05/06/2019 -   Chief Complaint  Patient presents with   Follow-up    Pt states she has both good days and bad days with breathing. Pt states she will occ have to use her rescue inhaler at least 1-2 times with activities.     HPI Nichole Reyes 70 y.o. -referred by Dr. 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  She was first seen by Dr. Carlis Abbott in September 2020 noted above.  She followed up in December 2020 and had pulmonary function test.  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  Due to insurance delays she has not started the nintedanib yet.  She is wondering about this.  She has specific questions about taking Covid vaccine.  She is also worried about her significant cough.  Review shows she is on ACE inhibitor.  She has a tickle in the throat.  She has acid reflux and is on Prilosec.  She has a history  of hiatal hernia.  She is taken a decision on starting nintedanib but she is interested in hearing about the other antifibrotic pirfenidone because of insurance delay she has not started on anything as yet.   [place] Integrated Comprehensive ILD Questionnaire  Symptoms:  -Insidious onset of shortness of breath 7 months ago.  Since it started it has been the same.  She also has episodic amount of dyspnea.  The severity classification is below.  She does not know when her cough started but she does have a cough associated with clearing of the throat.  It is  moderate in intensity.  She does cough at night.  She does bring up phlegm.  The phlegm is usually white but sometimes yellow.  She does feel a tickle in her throat but does not wake up in the middle of the night because of the cough.  There is no wheezing.  Cough is associated with ACE inhibitor intake.    Past Medical History : Positive for fibromyalgia, hiatal hernia, sleep apnea both for the last few years.  There is also borderline diabetes..  And hyperlipidemia   she denies any asthma or COPD or heart failure rheumatoid arthritis or scleroderma or lupus.  Denies any polymyositis.  Denies Sjogren's.  Denies HIV.  Denies pulmonary hypertension.  Denies thyroid disease or stroke.  Denies seizures or mononucleosis or hepatitis or tuberculosis.  Denies kidney disease.  Denies pneumonia.  Denies history of blood clots or heart disease or pleurisy.  Autoimmune serology sept 2020  - normal Results for Nichole Reyes, Nichole Reyes (MRN [id number]) as of 05/06/2019 12:01  Ref. Range 11/19/2018 14:58  SEE BELOW Unknown Comment  Anti Nuclear Antibody (ANA) Latest Ref Range: Negative  Negative  Anti JO-1 Latest Ref Range: 0.0 - 0.9 AI <0.2  CENTROMERE AB SCREEN Latest Ref Range: 0.0 - 0.9 AI <0.2  dsDNA Ab Latest Ref Range: 0 - 9 IU/mL <1  ENA RNP Ab Latest Ref Range: 0.0 - 0.9 AI <0.2  ENA SSA (RO) Ab Latest Ref Range: 0.0 - 0.9 AI <0.2  ENA SSB (LA) Ab Latest Ref Range: 0.0 - 0.9 AI <0.2  ENA SM Ab Ser-aCnc Latest Ref Range: 0.0 - 0.9 AI <0.2  Chromatin Ab SerPl-aCnc Latest Ref Range: 0.0 - 0.9 AI <0.2  Anti-Jo-1 Ab (RDL) Latest Ref Range: <20 Units <20  Anti-PL-7 Ab (RDL) Unknown CANCELED  Anti-PL-12 Ab (RDL) Unknown CANCELED  Anti-EJ Ab (RDL) Unknown CANCELED  Anti-OJ Ab (RDL) Unknown CANCELED  Anti-SRP Ab (RDL) Unknown CANCELED  Anti-Mi-2 Ab (RDL) Unknown CANCELED  Anti-TIF-1gamma Ab (RDL) Latest Units: Units CANCELED  Anti-MDA-5 Ab (CADM-140)(RDL) Latest Units: Units CANCELED  Anti-NXP-2 (P140) Ab  (RDL) Latest Units: Units CANCELED  Anti-SAE1 Ab, IgG (RDL) Latest Units: Units CANCELED  Anti-PM/Scl-100 Ab (RDL) Latest Ref Range: <20 Units <20  Anti-Ku Ab (RDL) Unknown CANCELED  Anti-SS-A 52kD Ab, IgG (RDL) Latest Ref Range: <20 Units <20  Anti-U1 RNP Ab (RDL) Latest Units: Units CANCELED    ROS: She has fatigue for the last several years and also arthralgia.  She attributes this to fibromyalgia.  She also has pain in her fingers which she attributes due to fibromyalgia.  She has heartburn from hiatal hernia she says.  She also snores because of her sleep apnea and she has a rash related to eczema.  But otherwise negative.   FAMILY HISTORY of LUNG DISEASE: She thinks her dad might have had COPD and her grandfather.  However nobody with pulmonary fibrosis  or other lung diseases.   EXPOSURE HISTORY: Denies ever having smoked cigarettes although she grew up in a home with her dad smoked.  Therefore positive for passive smoking as a child.  Denies cigar use.  Denies marijuana use denies cocaine use denies IV drug use.  Denies vaping.   HOME and HOBBY DETAILS : [place]  She is lived in this home for 22 years.  The home is 68 years old.  She has noticed mildew periodically in the shower curtain and in the bathroom.  She does use a CPAP mask but there is no water circuit mold or mildew in it.  She does do gardening with the flower garden she does use a steam iron but there is no mold or mildew in it.  There is no pet birds.  No feather pillow or duvet.  No pet gerbils.  No mold in the [facility] duct.  Does not play any wind instruments.  Does not use a humidifier.  Does not have a Jacuzzi in the house.   OCCUPATIONAL HISTORY (122 questions) : Essentially negative.  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   PULMONARY TOXICITY HISTORY (27 items): Negative except prednisone use    Results for Nichole Reyes, Nichole Reyes (MRN [id number]) as of 05/06/2019 12:01  Ref. Range 11/19/2018 but ? Done 02/28/2019 14:57  FVC-Pre Latest Units: L 2.29  FVC-%Pred-Pre Latest Units: % 70   Results for JAHNE, KRUKOWSKI (MRN [id number]) as of 05/06/2019 12:01  Ref. Range 11/19/2018 but ? Done 02/28/2019 14:57  DLCO unc Latest Units: ml/min/mmHg 15.75  DLCO unc % pred Latest Units: % 77    She had a high-resolution CT chest September 24, [facility].  This was read by our radiologist Dr Eddie Candle..  I do not have the image to visualized but I trust this report.  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  In addition is described a 1.4 cm right upper lobe posterior segment inferior part nodule that is subpleural and also 8 mm subpleural right middle lobe nodule.  He says these findings are unchanged compared to 2008 and are benign.  OV 06/03/2019 -telephone visit.  Patient identified with 2 person identifier.  Risks, benefits and limitations of telephone visit explained.  Subjective:  Patient ID: Nichole Reyes, female , DOB: 13-Feb-1951 , age 60 y.o. , MRN: [id number] , ADDRESS: [address] [place] [address]   Clinical IPF diagnosis given dec 2020 and reiterated Mid Feb 2021:  based on your age greater than 65, clubbing on exam, CT scan with high probability for this condition, negative history on your questionnaire and negative blood test on your serology exams   06/03/2019 -  Fu IPF - focus on esbriet monitoring and any other questions she might have   HPI Nichole Reyes 70 y.o. - on Esbriet now. Started it 05/22/2019 Thursday. Currently on 2 pills tid. Taking it with food. Spacing 5-6h between dosing. No side effects at this point.  From a dyspnea standpoint she is stable.  She had questions about being able to do gardening.  We discussed the fact this could cause mold  exposure and organic dust exposure that could irritate her IPF.  She says she can do it with  masking.  Gardening gives her purpose so I supported it.  She also plan [place] last week and was quite short of breath and took an inhaler.  I have suggested that she use a pulse ox meter and monitor her pulse ox and call us if her pulse ox below 88% so we can order portable oxygen [obviously will have to do stress testing submaximal 6-minute walk test] otherwise doing well.    OV 12/16/2019   Subjective:  Patient ID: Nichole Reyes, female , DOB: [birth year], age 52 y.o. years. , MRN: [id number],  ADDRESS: [place] [address] PCP  Rory Percy, MD Providers : Treatment Team:  Attending Provider: Brand Males, MD   Chief Complaint  Patient presents with   Follow-up    Tired all the time. redness/itching to bilateral nick and insides of  arms.  stress/heat?     Clinical IPF diagnosis given dec 2020 and reiterated Mid Feb 2021:  based on your age greater than 65, clubbing on exam, CT scan with high probability for this condition, negative history on your questionnaire and negative blood test on your serology exams . - on Esbriet now. Started it 05/22/2019   GERD/Hiatal Hernia  HPI Nichole Reyes 70 y.o. -returns for follow-up.  Last seen in early part of 2021.  After that she see nurse practitioner 2 times once or at least telephone visit.  She tried it in pulmonary rehabilitation for IPF but she had logistical issues.  Early morning she has to get her husband ready for work.  Late in the afternoon she has to pick up her granddaughter.  She also has knee issues from a fibromyalgia.  Therefore is not able to do pulmonary rehabilitation.  She is very compliant with her pirfenidone.  She feels she is tolerating it fine but she did admit to low appetite and also has a 7 pound weight loss.  She is also fatigued.  She thinks the fatigue is present for the last few months.  She  thinks it might be related to her daughter's wedding but then the fatigue has been there for the last few months while the daughter's wedding happened a few weeks ago.  She is not on oxygen.  Walking desaturation test is stable.  Most recent pulmonary function test is stable.  Symptom score other than fatigue is also stable.  She continues PPI for her acid reflux.      OV 05/06/2020  Subjective:  Patient ID: Nichole Reyes, female , DOB: 12-Aug-1950 , age 37 y.o. , MRN: [id number] , ADDRESS: [place] [address] PCP Rory Percy, MD Patient Care Team: Rory Percy, MD as PCP - General (Family Medicine) Herminio Commons, MD (Inactive) as PCP - Cardiology (Cardiology)  This Provider for this visit: Treatment Team:  Attending Provider: Brand Males, MD    05/06/2020 -   Chief Complaint  Patient presents with   Follow-up    SOB with exertion    Clinical IPF diagnosis given dec 2020 and reiterated Mid Feb 2021:  based on your age greater than 65, clubbing on exam, CT scan with high probability for this condition, negative history on your questionnaire and negative blood test on your serology exams . - on Esbriet now. Started it 05/22/2019   GERD/Hiatal Hernia  Last CT scan of the chest July 2020 at [facility] with probable UIP Last PFT June 2021  HPI Nichole Reyes 70 y.o. -presents for follow-up.  Last  seen in September 2021.  Since then she continues to deal with her right knee issue.  She is seen Dr. Bo Merino for this.  Apparently a knee MRI is pending.  She is continuing to lose weight.  She has lost 10 pounds in the last 1 year.  This correlates with starting pirfenidone.  Technically she is still overweight.  She is also reporting associated fatigue and metallic taste.  A few months ago she made a call and we told her to hold the pirfenidone.  After that the metallic taste largely resolved but it does come back although to a much lesser severity.   At this point in time it does not bother her too much so she think she will continue to take pirfenidone.  From a respiratory standpoint she feels stable although she would say that in the last 1 year she feels the disease might be slowly progressive.  Her symptom score shows slight worsening especially with shortness of breath and weight loss but same fatigue.  Some nausea with pirfenidone.  He was supposed to pulmonary function testing today but for some reason has not happened.  Last PFT was June 2021.  Last CT scan of the chest was July 2020 I do not have echocardiogram in our system.  She did have a low risk nuclear medicine stress test with 59% in June 2020   [place] 07/13/2020  Subjective:  Patient ID: Nichole Reyes, female , DOB: 1950-04-29 , age 45 y.o. , MRN: [id number] , ADDRESS: [place] [address] PCP Lanelle Bal, PA-C Patient Care Team: Lanelle Bal, PA-C as PCP - General (Family Medicine) Herminio Commons, MD (Inactive) as PCP - Cardiology (Cardiology)  This Provider for this visit: Treatment Team:  Attending Provider: Brand Males, MD    07/13/2020 -   Chief Complaint  Patient presents with   Follow-up    No concerns, here to get PFT results.    Clinical IPF diagnosis given dec 2020 and reiterated Mid Feb 2021:  based on your age greater than 65, clubbing on exam, CT scan with high probability for this condition, negative history on your questionnaire and negative blood test on serology exams . - Oon Esbriet since 05/22/2019   GERD/Hiatal Hernia  Last PFT April 2022 Last CT scan of the chest March 2022   HPI Nichole Reyes 70 y.o. -returns for follow-up to discuss her test results.  She definitely has more shortness of breath now than she did in September 2021.  She continues to lose weight.  But her BMI is still 27.  No metallic taste no nausea vomiting or diarrhea.  No chest pains.  Her pulmonary function test shows slight decline in DLCO but  FVC is stable.  Walking desaturation to stable.  High-resolution CT chest shows stability in ILD compared to 2020.  Therefore overall she is probably stable.  There is evidence of coronary artery calcification and also aortic valve calcifications on the CT.  Her echo showed mild diastolic dysfunction that could be contributing to the dyspnea.  There is also mild aortic valve sclerosis.  There is no chest pain with exertion.  Her last cardiologist in [place] is no longer in the health system.  She is not seen a cardiologist in 7 years.  Does not recollect when her last stress test was.   WE discussed   1. Scientific Purpose  Clinical research is designed to produce generalizable knowledge and to answer questions about the safety  and efficacy of intervention(s) under study in order to determine whether or not they may be useful for the care of future patients.  2. Study Procedures  Participation in a trial may involve procedures or tests, in addition to the intervention(s) under study, that are intended only or primarily to generate scientific knowledge and that are otherwise not necessary for patient care.   3. Uncertainty  For intervention(s) under study in clinical research, there often is less knowledge and more uncertainty about the risks and benefits to a population of trial participants than there is when a doctor offers a patient standard interventions.   4. Adherence to Protocol  Administration of the intervention(s) under study is typically based on a strict protocol with defined dose, scheduling, and use or avoidance of concurrent medications, compared to administration of standard interventions.  5. Clinician as Investigator  Clinicians who are in health care settings provide treatment; in a clinical trial setting, they are also investigating safety and efficacy of an intervention. In otherwise your doctor or nurse practitioner can be wearing 2 hats - one as care giver another as  Company secretary  6. Patient as Visual merchandiser Subject  Patients participating in research trials are research subjects or volunteers. In other words participating in research is 100% voluntary and at one's own free weill. The decision to participate or not participate will NOT affect patient care and the doctor-patient relationship in any way   ECHO 06/29/20  IMPRESSIONS     1. Left ventricular ejection fraction, by estimation, is 60 to 65%. The  left ventricle has normal function. The left ventricle has no regional  wall motion abnormalities. There is mild left ventricular hypertrophy.  Left ventricular diastolic parameters  are consistent with Grade I diastolic dysfunction (impaired relaxation).   2. Right ventricular systolic function is normal. The right ventricular  size is normal. There is normal pulmonary artery systolic pressure. The  estimated right ventricular systolic pressure is 75.9 mmHg.   3. The mitral valve is grossly normal. Trivial mitral valve  regurgitation.   4. The aortic valve is tricuspid. Aortic valve regurgitation is not  visualized. Mild aortic valve sclerosis is present, with no evidence of  aortic valve stenosis.   5. The inferior vena cava is normal in size with greater than 50%  respiratory variability, suggesting right atrial pressure of 3 mmHg.   Comparison(s): No prior Echocardiogram.    CT Chest data march 22022  Narrative & Impression  CLINICAL DATA:  70 year old female with history of chronic dyspnea. Fibrotic changes noted on prior CT examination. Follow-up study.   EXAM: CT CHEST WITHOUT CONTRAST   TECHNIQUE: Multidetector CT imaging of the chest was performed following the standard protocol without intravenous contrast. High resolution imaging of the lungs, as well as inspiratory and expiratory imaging, was performed.   COMPARISON:  No priors available for comparison.   FINDINGS: Cardiovascular: Heart size is normal.  There is no significant pericardial fluid, thickening or pericardial calcification. There is aortic atherosclerosis, as well as atherosclerosis of the great vessels of the mediastinum and the coronary arteries, including calcified atherosclerotic plaque in the left main, left anterior descending, left circumflex and right coronary arteries. Calcifications of the aortic valve.   Mediastinum/Nodes: No pathologically enlarged mediastinal or hilar lymph nodes. Please note that accurate exclusion of hilar adenopathy is limited on noncontrast CT scans. Esophagus is unremarkable in appearance. No axillary lymphadenopathy.   Lungs/Pleura: High-resolution images demonstrate widespread areas of peripheral predominant ground-glass attenuation,  septal thickening, mild cylindrical bronchiectasis and peripheral bronchiolectasis. Findings have a mild craniocaudal gradient. No definitive honeycombing is identified. Inspiratory and expiratory imaging is unremarkable. No acute consolidative airspace disease. No pleural effusions. In the periphery of the right middle lobe (axial image 89 of series 6) there is a 7 x 5 mm (mean diameter of 6 mm) subpleural pulmonary nodule (stable compared to prior study 09/24/2018). In the superior segment of the right lower lobe (axial image 73 of series 6) there is a 2.3 x 0.8 cm elongated nodular area of architectural distortion which is intimately associated with an accessory fissure, which appears slightly larger on axial images, but is completely stable in size and appearance when viewed on sagittal reconstructions.   Upper Abdomen: Aortic atherosclerosis.   Musculoskeletal: There are no aggressive appearing lytic or blastic lesions noted in the visualized portions of the skeleton.   IMPRESSION: 1. 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 Repeat high-resolution chest CT is recommended in 12 months to assess for temporal changes in the appearance of the lung parenchyma. 2. Pulmonary nodules in the right lung are stable compared to the prior study from 2020, likely benign. Attention at time of repeat high-resolution chest CT is recommended to ensure continued stability. 3. Aortic atherosclerosis, in addition to left main and 3 vessel coronary artery disease. Please note that although the presence of coronary artery calcium documents the presence of coronary artery disease, the severity of this disease and any potential stenosis cannot be assessed on this non-gated CT examination. Assessment for potential risk factor modification, dietary therapy or pharmacologic therapy may be warranted, if clinically indicated. 4. There are calcifications of the aortic valve. Echocardiographic correlation for evaluation of potential valvular dysfunction may be warranted if clinically indicated.   Aortic Atherosclerosis (ICD10-I70.0).   Electronically Signed: By: Vinnie Langton M.D. On: 06/01/2020 09:51  ADDENDUM REPORT: 06/21/2020 14:33   ADDENDUM: It was brought to my attention that there is a prior chest CT from 09/24/2018. After comparison between the 05/31/2020 exam and the 09/24/2018 exam, there is no evidence of progression. Assessment remains unchanged. Specifically, based on the spectrum of findings this continues to be categorized as probable usual interstitial pneumonia (UIP) per current ATS guidelines.     Electronically Signed   By: Vinnie Langton M.D.   On: 06/21/2020 14:33        OV 01/24/2021  Subjective:  Patient ID: Nichole Reyes, female , DOB: 1950/09/14 , age 20 y.o. , MRN: [id number] , ADDRESS: [address] [place] [address] PCP Lanelle Bal, PA-C Patient Care Team: Lanelle Bal, PA-C as PCP - General (Family Medicine) Satira Sark, MD as PCP - Cardiology  (Cardiology)  This Provider for this visit: Treatment Team:  Attending Provider: Brand Males, MD    01/24/2021 -   Chief Complaint  Patient presents with   Follow-up    F/U after PFT. States her breathing has been stable since last visit with SOB with exertion.     Clinical IPF diagnosis given dec 2020 and reiterated Mid Feb 2021:  based on your age greater than 65, clubbing on exam, CT scan with high probability for this condition, negative history on your questionnaire and negative blood test on serology exams . - Oon Esbriet since 05/22/2019   GERD/Hiatal Hernia  Last PFT NOv  2022 Last CT scan of the chest  March 2022   DJD/OA knee  Drug induced weihgt loss   HPI Nichole Reyes 70 y.o. -returns for follow-up.  In this visit she has had pulmonary function test.  It shows a 7% decline in FVC and DLCO in 2 years.  Is roughly 3.5 %/year.  She is actually having more shortness of breath.  She did see cardiology and had a normal Myoview stress test.  She has been reassured from a cardiology standpoint.  But has dyspnea score is worse.  She is unable to do pulmonary rehabilitation because of issues with family and also because she has osteoarthritis of her knees.  She is seeing rheumatology for this.  She says she has never seen orthopedic for this.  Did indicate to her to talk to rheumatology and see if at this time to do a knee replacement and if she needs an orthopedic referral.  Did indicate that at some point the ILD could get worse and she would be quite dyspneic and unable to mobilize.  But at this point from a safety standpoint would be a good point in time to do a knee replacement if the knees are really bad.  She understood this.  We did discuss worsening.  She is on maximal standard of care.  We did discuss clinical trials as a care option she is interested.  She has been losing weight with pirfenidone there is more weight loss.  Her BMI is 27 and technically she is still  overweight.  We decided to continue with pirfenidone.  She will have a liver function test today.  She will have a high-dose flu shot today.     SYMPTOM SCALE - ILD 05/06/2019 179# 12/16/2019 172# - esbriet + 05/06/2020 169# - esbriet 07/13/2020 167# - on esbreiet 01/24/2021 164#  O2 use ra      Shortness of Breath 0 -> 5 scale with 5 being worst (score 6 If unable to do)      At rest 4 1 0 2 3  Simple tasks - showers, clothes change, eating, shaving 4 1 2 2 4   Household (dishes, doing bed, laundry) 5 5 4 4 5   Shopping 5 3 4 3 4   Walking level at own pace 5 3 4 3 4   Walking up Stairs 5 5 5 4 5   Total (30-36) Dyspnea Score 28 15 19 18 25   How bad is your cough? 2.5 1 0 0 0  How bad is your fatigue  yes 4 3 4 3   How bad is nausea x 0 2 0 2  How bad is vomiting?  x 0 0 0 0  How bad is diarrhea? x 0 0 0 0  How bad is anxiety? x 00 1 2 3   How bad is depression x 0 3 2 2       Simple office walk 185 feet x  3 laps goal with forehead probe 05/06/2019  12/16/2019  05/06/2020  07/13/2020  01/24/2021   O2 used ra ra ra ra ra  Number laps completed 3 3 3  goal - stopped at 2 due to dyspnea 3 and did all 3 3  Comments about pace avg mod Slow pace avg   Resting Pulse Ox/HR 98% and 90/min 97% and 77/min 98% and 90/min 96% and 94 98% and 90  Final Pulse Ox/HR 96% and 100/min 97% and 91/mn 94% and 92 at 2nd lap 93% and 103/min 96% and 99  Desaturated </= 88% no no no no no  Desaturated <= 3% points no no Yes,, 4 pint Yes 3 points no  Got Tachycardic >/= 90/min yes yes yes yes   Symptoms at end of test Mod dyspnea, knee pain Dyspnea and fatigue strting at 1st lap and increased by 3rd stoppe early and did drop 3 points Dyspnea at end   Miscellaneous comments x        PFT  PFT Results Latest Ref Rng & Units 01/24/2021 07/13/2020 08/26/2019 11/19/2018  FVC-Pre L 2.12 2.32 2.36 2.29  FVC-Predicted Pre % 66 72 72 70  FVC-Post L - - - 2.30  FVC-Predicted Post % - - - 70  Pre FEV1/FVC % % 89 89 88  89  Post FEV1/FCV % % - - - 90  FEV1-Pre L 1.88 2.07 2.08 2.04  FEV1-Predicted Pre % 77 84 83 82  FEV1-Post L - - - 2.07  DLCO uncorrected ml/min/mmHg 14.63 13.98 15.62 15.75  DLCO UNC% % 72 68 76 77  DLCO corrected ml/min/mmHg 14.63 13.98 15.62 -  DLCO COR %Predicted % 72 68 76 -  DLVA Predicted % 114 107 113 118  TLC L - - - 3.44  TLC % Predicted % - - - 66  RV % Predicted % - - - 50       has a past medical history of Coronary artery calcification seen on CT scan, Essential hypertension, Fibromyalgia, Idiopathic pulmonary fibrosis (HCC), Mixed hyperlipidemia, Osteoporosis, and Type 2 diabetes mellitus ([place]).   reports that she has never smoked. She has never used smokeless tobacco.  Past Surgical History:  Procedure Laterality Date   ABDOMINAL HYSTERECTOMY  2007   CATARACT EXTRACTION Bilateral     Allergies  Allergen Reactions   Latex    Pseudoephedrine Other (See Comments)    Rash and hallucinations    Sulfa Antibiotics    Sulfonamide Derivatives Rash    Immunization History  Administered Date(s) Administered   Fluad Quad(high Dose 65+) 01/29/2019, 12/16/2019   Influenza Split 12/18/2017   Influenza-Unspecified 11/18/2013   PFIZER(Purple Top)SARS-COV-2 Vaccination 05/15/2019, 06/13/2019   Tdap 05/04/1999    Family History  Problem Relation Age of Onset   Hypertension Mother    Diabetes Mother    Osteoporosis Mother    Hypertension Father    Heart disease Father    COPD Father    Colon cancer Sister    Cancer Brother    Heart disease Brother    Heart disease Brother    Emphysema Paternal Grandfather      Current Outpatient Medications:    ACCU-CHEK AVIVA PLUS test strip, , Disp: , Rfl:    Accu-Chek Softclix Lancets lancets, , Disp: , Rfl:    albuterol (VENTOLIN HFA) 108 (90 Base) MCG/ACT inhaler, 2 puffs every 4 (four) hours as needed. , Disp: , Rfl:    aspirin EC 81 MG tablet, Take 81 mg by mouth daily., Disp: , Rfl:    Calcium Carbonate (CALTRATE  600 PO), Take 1 tablet by mouth 2 (two) times a day., Disp: , Rfl:    clonazePAM (KLONOPIN) 0.5 MG tablet, Take 0.5 mg by mouth daily. , Disp: , Rfl:    ESBRIET 267 MG TABS, TAKE 3 TABLETS THREE TIMES A DAY WITH FOOD (Patient taking differently: Taking 1 large pill daily.  801mg ), Disp: 270 tablet, Rfl: 5   ezetimibe (ZETIA) 10 MG tablet, Take 1 tablet (10 mg total) by mouth daily., Disp: 30 tablet, Rfl: 0   fenofibrate (TRICOR) 48 MG tablet, Take 48 mg by  mouth daily. , Disp: , Rfl:    FLOVENT HFA 110 MCG/ACT inhaler, INHALE 1 PUFF INTO THE LUNGS 2 TIMES A DAY., Disp: 12 g, Rfl: 3   losartan (COZAAR) 50 MG tablet, Take 50 mg by mouth daily., Disp: , Rfl:    metFORMIN (GLUCOPHAGE-XR) 500 MG 24 hr tablet, Take 500 mg by mouth daily with breakfast. , Disp: , Rfl:    metoprolol succinate (TOPROL-XL) 50 MG 24 hr tablet, Take 50 mg by mouth daily. , Disp: , Rfl:    omeprazole (PRILOSEC) 20 MG capsule, TAKE 1 CAPSULE BY MOUTH ONCE DAILY., Disp: 30 capsule, Rfl: 5   SAVELLA 50 MG TABS tablet, Take 50 mg by mouth 2 (two) times daily. , Disp: , Rfl:    tiZANidine (ZANAFLEX) 4 MG tablet, TAKE (1) TABLET EVERY NIGHT AT BEDTIME., Disp: 30 tablet, Rfl: 2   triamcinolone cream (KENALOG) 0.1 %, SMARTSIG:1 Application Topical 2-3 Times Daily, Disp: , Rfl:       Objective:   Vitals:   01/24/21 1136  BP: 126/68  Pulse: 90  Temp: 97.9 F (36.6 C)  TempSrc: Temporal  SpO2: 98%  Weight: 164 lb (74.4 kg)  Height: 5\' 5"  (1.651 m)    Estimated body mass index is 27.29 kg/m as calculated from the following:   Height as of this encounter: 5\' 5"  (1.651 m).   Weight as of this encounter: 164 lb (74.4 kg).  @WEIGHTCHANGE @  [employer]   01/24/21 1136  Weight: 164 lb (74.4 kg)     Physical Exam    General: No distress. Looks normal Neuro: Alert and Oriented x 3. GCS 15. Speech normal Psych: Pleasant Resp:  Barrel Chest - no.  Wheeze - no, Crackles - no, No overt respiratory distress CVS: Normal  heart sounds. Murmurs - no Ext: Stigmata of Connective Tissue Disease - no HEENT: Normal upper airway. PEERL +. No post nasal drip        Assessment:       ICD-10-CM   1. IPF (idiopathic pulmonary fibrosis) (HCC)  J84.112 Hepatic function panel    Hepatic function panel    2. Medication monitoring encounter  Z51.81     3. Drug-induced weight loss  R63.4    T50.905A     4. Vaccine counseling  Z71.85     5. Need for immunization against influenza  Z23          Plan:     Patient Instructions  IPF (idiopathic pulmonary fibrosis) ([place]) Medication monitoring encounter Gastroesophageal reflux disease, unspecified whether esophagitis present Dyspnea on exertion  -IPF 7% worse sept 2020 -> Nov 2022 (this very mild decline) -You are having  weight loss in association with pirfenidone  Plan - check LFT 01/24/2021  - high dose flu shot 01/24/2021 -Continue acid reflux medications -Continue pirfenidone per schedule [side effect issues of weight loss discussed]  -At this point in time benefit outweighs risk  -Continue space between meals and apply sunscreen when going out -We discussed clinical trials as a care option and appreciate your interest  -We can look at prescreen for potential research protocols in the future  -Do repeat spirometry and DLCO in 4 months  Drug-induced weight loss  -15 pound weight loss but in February 2021 and Nov  2022 most likely because of pirfenidone -At this point in time is still are technically overweight -Therefore we will leverage pirfenidone and helping you with your weight loss -We will continue to monitor -Do talk to primary  care physician about other reasons for weight loss  Flu vaccine need  Plan  - high dose flu shot 01/24/2021  Knee Arhtiritis  Hindering ability to do pulmonary rehab   Plan  - d/w rheum if is time for knee rplacement evaluation - k   Followup -Return to see Dr. Chase Caller in 4 months   after repeat  spirometry and DLCO  -ILD symptom score and simple walk test at follow-up      SIGNATURE    Dr. Brand Males, M.D., F.C.C.P,  Pulmonary and Critical Care Medicine Staff Physician, [place] Director - Interstitial Lung Disease  Program  Pulmonary [place] at [place], [place], [address]  Pager: [phone], If no answer or between  15:00h - 7:00h: call [phone] Telephone: [phone]  12:33 PM 01/24/2021

## 2021-01-24 NOTE — Patient Instructions (Signed)
Spirometry/DLCO performed today. 

## 2021-02-23 ENCOUNTER — Other Ambulatory Visit: Payer: Self-pay | Admitting: Physician Assistant

## 2021-02-23 NOTE — Telephone Encounter (Signed)
Next Visit: 04/28/2020   Last Visit: 10/22/2020   Last Fill: 11/29/2020   Dx:  Fibromyalgia   Current Dose per office note on 10/22/2020: zanaflex 4 mg at bedtime for muscle spasms    Okay to refill Zanaflex?

## 2021-03-01 ENCOUNTER — Other Ambulatory Visit: Payer: Self-pay | Admitting: Pharmacist

## 2021-03-01 DIAGNOSIS — J84112 Idiopathic pulmonary fibrosis: Secondary | ICD-10-CM

## 2021-03-01 MED ORDER — PIRFENIDONE 801 MG PO TABS: 801.0000 mg | ORAL_TABLET | Freq: Three times a day (TID) | ORAL | 1 refills | Status: DC

## 2021-03-01 NOTE — Telephone Encounter (Signed)
Refill sent for ESBRIET to [facility], dispensing) for Esbriet: [phone]  Dose: 801 mg three times daily Called patient and confirmed that she does take 1 tablet of 801mg  tablet three times daily.  Last OV: 01/24/21 Provider: Dr. Chase Caller  Next OV: 04/28/21  LFTs on 01/24/21 wnl. Next labs due March 2023  Knox Saliva, PharmD, MPH, BCPS Clinical Pharmacist (Rheumatology and Pulmonology)

## 2021-03-08 ENCOUNTER — Other Ambulatory Visit: Payer: Self-pay | Admitting: Internal Medicine

## 2021-03-08 DIAGNOSIS — R0609 Other forms of dyspnea: Secondary | ICD-10-CM

## 2021-03-08 DIAGNOSIS — J849 Interstitial pulmonary disease, unspecified: Secondary | ICD-10-CM

## 2021-03-08 DIAGNOSIS — K219 Gastro-esophageal reflux disease without esophagitis: Secondary | ICD-10-CM

## 2021-03-23 ENCOUNTER — Other Ambulatory Visit: Payer: Self-pay | Admitting: Rheumatology

## 2021-03-23 NOTE — Telephone Encounter (Signed)
Next Visit: 04/28/2020   Last Visit: 10/22/2020   Last Fill: 02/23/2021   Dx:  Fibromyalgia   Current Dose per office note on 10/22/2020: zanaflex 4 mg at bedtime for muscle spasms    Okay to refill Zanaflex?

## 2021-03-24 ENCOUNTER — Telehealth: Payer: Self-pay | Admitting: Internal Medicine

## 2021-03-24 DIAGNOSIS — J84112 Idiopathic pulmonary fibrosis: Secondary | ICD-10-CM

## 2021-03-24 MED ORDER — PIRFENIDONE 801 MG PO TABS
801.0000 mg | ORAL_TABLET | Freq: Three times a day (TID) | ORAL | 0 refills | Status: DC
Start: 2021-03-24 — End: 2021-07-21

## 2021-03-24 NOTE — Telephone Encounter (Signed)
Refill sent for ESBRIET to [facility] ([facility]) for Esbriet: [phone]  Dose: 801 mg three times daily  Last OV: 01/24/21 Provider: Dr. Chase Caller  Next OV: 06/14/21 - can have repeat LFTs drawn at that visit  Knox Saliva, PharmD, MPH, BCPS Clinical Pharmacist (Rheumatology and Pulmonology)

## 2021-04-15 NOTE — Progress Notes (Deleted)
Office Visit Note  Patient: Nichole Reyes             Date of Birth: [birth year]           MRN: [id number]             PCP: Lanelle Bal, PA-C Referring: Lanelle Bal, PA-C Visit Date: 04/28/2021 Occupation: @GUAROCC @  Subjective:  No chief complaint on file.   History of Present Illness: Nichole Reyes is a 71 y.o. female ***   Activities of Daily Living:  Patient reports morning stiffness for *** {minute/hour:19697}.   Patient {ACTIONS;DENIES/REPORTS:21021675::"Denies"} nocturnal pain.  Difficulty dressing/grooming: {ACTIONS;DENIES/REPORTS:21021675::"Denies"} Difficulty climbing stairs: {ACTIONS;DENIES/REPORTS:21021675::"Denies"} Difficulty getting out of chair: {ACTIONS;DENIES/REPORTS:21021675::"Denies"} Difficulty using hands for taps, buttons, cutlery, and/or writing: {ACTIONS;DENIES/REPORTS:21021675::"Denies"}  No Rheumatology ROS completed.   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   DDD (degenerative disc disease), lumbar 06/26/2016   History of hypertension 06/26/2016   History of high cholesterol 06/26/2016   Other sleep apnea 06/26/2016   History of osteopenia 06/26/2016   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   THYROID NODULE, LEFT 01/03/2008   HYPERLIPIDEMIA 01/03/2008   DEPRESSION 01/03/2008   MIGRAINE HEADACHE 01/03/2008   ALLERGIC RHINITIS 01/03/2008   ALOPECIA 01/03/2008   ALLERGIC ARTHRITIS SITE UNSPECIFIED 01/03/2008    Past Medical History:  Diagnosis Date    Coronary artery calcification seen on CT scan    Essential hypertension    Fibromyalgia    Idiopathic pulmonary fibrosis (HCC)    Mixed hyperlipidemia    Osteoporosis    Type 2 diabetes mellitus ([place])     Family History  Problem Relation Age of Onset   Hypertension Mother    Diabetes Mother    Osteoporosis Mother    Hypertension Father    Heart disease Father    COPD Father    Colon cancer Sister    Cancer Brother    Heart disease Brother    Heart disease Brother    Emphysema Paternal Grandfather    Past Surgical History:  Procedure Laterality Date   ABDOMINAL HYSTERECTOMY  2007   CATARACT EXTRACTION Bilateral    Social History   Social History Narrative   Not on file   Immunization History  Administered Date(s) Administered   Fluad Quad(high Dose 65+) 01/29/2019, 12/16/2019, 01/24/2021   Influenza Split 12/18/2017   Influenza-Unspecified 11/18/2013   PFIZER(Purple Top)SARS-COV-2 Vaccination 05/15/2019, 06/13/2019   Tdap 05/04/1999     Objective: Vital Signs: There were no vitals taken for this visit.   Physical Exam   Musculoskeletal Exam: ***  CDAI Exam: CDAI Score: -- Patient Global: --; Provider Global: -- Swollen: --; Tender: -- Joint Exam 04/28/2021   No joint exam has been documented for this visit   There is currently no information documented on the homunculus. Go to the Rheumatology activity and complete the homunculus joint exam.  Investigation: No additional findings.  Imaging: No results found.  Recent Labs: Lab Results  Component Value Date   WBC 10.5 03/15/2017   HGB 14.6 03/15/2017   PLT 439 (H) 03/15/2017  NA 140 03/15/2017   K 4.8 03/15/2017   CL 103 03/15/2017   CO2 31 03/15/2017   GLUCOSE 104 (H) 03/15/2017   BUN 12 03/15/2017   CREATININE 0.73 03/15/2017   BILITOT 0.3 01/24/2021   ALKPHOS 72 01/24/2021   AST 22 01/24/2021   ALT 15 01/24/2021   PROT 6.9 01/24/2021   ALBUMIN 4.1 01/24/2021    CALCIUM 10.0 03/15/2017   GFRAA 101 03/15/2017    Speciality Comments: No specialty comments available.  Procedures:  No procedures performed Allergies: Latex, Pseudoephedrine, Sulfa antibiotics, and Sulfonamide derivatives   Assessment / Plan:     Visit Diagnoses: Fibromyalgia  Other insomnia  Other fatigue  ILD (interstitial lung disease) ([place])  Family history of rheumatoid arthritis  Primary osteoarthritis of both knees  Primary osteoarthritis of both feet  DDD (degenerative disc disease), cervical  DDD (degenerative disc disease), thoracic  DDD (degenerative disc disease), lumbar  Osteopenia of multiple sites  History of migraine  History of hypertension  History of depression  History of hypercholesterolemia  History of sleep apnea  Orders: No orders of the defined types were placed in this encounter.  No orders of the defined types were placed in this encounter.   Face-to-face time spent with patient was *** minutes. Greater than 50% of time was spent in counseling and coordination of care.  Follow-Up Instructions: No follow-ups on file.   Ofilia Neas, PA-C  Note - This record has been created using Dragon software.  Chart creation errors have been sought, but may not always  have been located. Such creation errors do not reflect on  the standard of medical care.

## 2021-04-25 ENCOUNTER — Other Ambulatory Visit: Payer: Self-pay | Admitting: Rheumatology

## 2021-04-25 NOTE — Telephone Encounter (Signed)
Next Visit: 04/28/2020   Last Visit: 10/22/2020   Last Fill: 03/23/2021   Dx:  Fibromyalgia   Current Dose per office note on 10/22/2020: zanaflex 4 mg at bedtime for muscle spasms    Okay to refill Zanaflex?

## 2021-04-28 ENCOUNTER — Ambulatory Visit: Payer: Medicare Other | Admitting: Rheumatology

## 2021-04-28 DIAGNOSIS — M5136 Other intervertebral disc degeneration, lumbar region: Secondary | ICD-10-CM

## 2021-04-28 DIAGNOSIS — Z8639 Personal history of other endocrine, nutritional and metabolic disease: Secondary | ICD-10-CM

## 2021-04-28 DIAGNOSIS — J849 Interstitial pulmonary disease, unspecified: Secondary | ICD-10-CM

## 2021-04-28 DIAGNOSIS — M19072 Primary osteoarthritis, left ankle and foot: Secondary | ICD-10-CM

## 2021-04-28 DIAGNOSIS — Z8261 Family history of arthritis: Secondary | ICD-10-CM

## 2021-04-28 DIAGNOSIS — M5030 Other cervical disc degeneration, unspecified cervical region: Secondary | ICD-10-CM

## 2021-04-28 DIAGNOSIS — Z8669 Personal history of other diseases of the nervous system and sense organs: Secondary | ICD-10-CM

## 2021-04-28 DIAGNOSIS — M8589 Other specified disorders of bone density and structure, multiple sites: Secondary | ICD-10-CM

## 2021-04-28 DIAGNOSIS — Z8659 Personal history of other mental and behavioral disorders: Secondary | ICD-10-CM

## 2021-04-28 DIAGNOSIS — R5383 Other fatigue: Secondary | ICD-10-CM

## 2021-04-28 DIAGNOSIS — M797 Fibromyalgia: Secondary | ICD-10-CM

## 2021-04-28 DIAGNOSIS — Z8679 Personal history of other diseases of the circulatory system: Secondary | ICD-10-CM

## 2021-04-28 DIAGNOSIS — M5134 Other intervertebral disc degeneration, thoracic region: Secondary | ICD-10-CM

## 2021-04-28 DIAGNOSIS — G4709 Other insomnia: Secondary | ICD-10-CM

## 2021-04-28 DIAGNOSIS — M170 Bilateral primary osteoarthritis of knee: Secondary | ICD-10-CM

## 2021-04-29 NOTE — Progress Notes (Signed)
Office Visit Note  Patient: Nichole Reyes             Date of Birth: 09-Feb-1951           MRN: [id number]             PCP: Lanelle Bal, PA-C Referring: Lanelle Bal, PA-C Visit Date: 05/02/2021 Occupation: @GUAROCC @  Subjective:  Right knee joint pain   History of Present Illness: Nichole Reyes is a 71 y.o. female with history of fibromyalgia, osteoarthritis, and ILD.  Patient presents today with increased pain in her right knee joint.  She denies any recent injury or fall.  She states that she was diagnosed with osteoarthritis several years ago and has had persistent discomfort in her right knee.  She states over the past several months her pain has been severe.  She states the pain is started to radiate down to her right foot.  She has occasional catching sensation while walking.  She denies any discomfort in her left knee joint at this time.  She denies any lower extremity muscle weakness.  She has noticed some increased swelling in her right knee.  She has taken Advil and acetaminophen combination as needed for pain relief which has alleviated her symptoms.  She would like to have a right knee joint cortisone injection today.  She states that overall her fibromyalgia pain has been tolerable.  She takes zanaflex 4 mg at bedtime as needed for muscle spasms.  She had a recent appointment with Dr. Chase Caller on 01/24/2021.  She remains on Esbriet as prescribed.  She had pulmonary function testing performed on 01/24/2021.  Activities of Daily Living:  Patient reports morning stiffness for 5 minutes.   Patient Reports nocturnal pain.  Difficulty dressing/grooming: Denies Difficulty climbing stairs: Reports Difficulty getting out of chair: Reports Difficulty using hands for taps, buttons, cutlery, and/or writing: Denies  Review of Systems  Constitutional:  Negative for fatigue.  HENT:  Negative for mouth sores, mouth dryness and nose dryness.   Eyes:  Negative for pain, itching and  dryness.  Respiratory:  Positive for shortness of breath. Negative for difficulty breathing.   Cardiovascular:  Negative for chest pain and palpitations.  Gastrointestinal:  Negative for blood in stool, constipation and diarrhea.  Endocrine: Negative for increased urination.  Genitourinary:  Negative for difficulty urinating.  Musculoskeletal:  Positive for joint pain, joint pain, joint swelling, myalgias, morning stiffness and myalgias. Negative for muscle tenderness.  Skin:  Negative for color change, rash and redness.  Allergic/Immunologic: Positive for susceptible to infections.  Neurological:  Negative for dizziness, numbness, headaches, memory loss and weakness.  Hematological:  Positive for bruising/bleeding tendency.  Psychiatric/Behavioral:  Negative for confusion.    PMFS History:  Patient Active Problem List   Diagnosis Date Noted   ILD (interstitial lung disease) ([place]) 08/26/2019   Fibromyalgia syndrome 06/26/2016   Other fatigue 06/26/2016   Other insomnia 06/26/2016   Arthralgia of both knees 06/26/2016   Pain in both feet 06/26/2016   Primary osteoarthritis of both feet 06/26/2016   Primary osteoarthritis of both knees 06/26/2016   DDD (degenerative disc disease), cervical 06/26/2016   DDD (degenerative disc disease), thoracic 06/26/2016   DDD (degenerative disc disease), lumbar 06/26/2016   History of hypertension 06/26/2016   History of high cholesterol 06/26/2016   Other sleep apnea 06/26/2016   History of osteopenia 06/26/2016   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   THYROID NODULE, LEFT 01/03/2008   HYPERLIPIDEMIA 01/03/2008  DEPRESSION 01/03/2008   MIGRAINE HEADACHE 01/03/2008   ALLERGIC RHINITIS 01/03/2008   ALOPECIA 01/03/2008   ALLERGIC ARTHRITIS SITE UNSPECIFIED 01/03/2008    Past Medical History:  Diagnosis Date   Coronary artery calcification seen on CT scan    Essential hypertension    Fibromyalgia    Idiopathic pulmonary fibrosis  (HCC)    Mixed hyperlipidemia    Osteoporosis    Type 2 diabetes mellitus ([place])     Family History  Problem Relation Age of Onset   Hypertension Mother    Diabetes Mother    Osteoporosis Mother    Hypertension Father    Heart disease Father    COPD Father    Colon cancer Sister    Cancer Brother    Heart disease Brother    Heart disease Brother    Emphysema Paternal Grandfather    Past Surgical History:  Procedure Laterality Date   ABDOMINAL HYSTERECTOMY  2007   CATARACT EXTRACTION Bilateral    Social History   Social History Narrative   Not on file   Immunization History  Administered Date(s) Administered   Fluad Quad(high Dose 65+) 01/29/2019, 12/16/2019, 01/24/2021   Influenza Split 12/18/2017   Influenza-Unspecified 11/18/2013   PFIZER(Purple Top)SARS-COV-2 Vaccination 05/15/2019, 06/13/2019   Tdap 05/04/1999     Objective: Vital Signs: BP 125/79 (BP Location: Left Arm, Patient Position: Sitting, Cuff Size: Normal)    Pulse 88    Ht 5\' 6"  (1.676 m)    Wt 165 lb 12.8 oz (75.2 kg)    BMI 26.76 kg/m    Physical Exam Vitals and nursing note reviewed.  Constitutional:      Appearance: She is well-developed.  HENT:     Head: Normocephalic and atraumatic.  Eyes:     Conjunctiva/sclera: Conjunctivae normal.  Cardiovascular:     Rate and Rhythm: Normal rate and regular rhythm.     Heart sounds: Normal heart sounds.  Pulmonary:     Effort: Pulmonary effort is normal.     Breath sounds: Normal breath sounds.  Abdominal:     General: Bowel sounds are normal.     Palpations: Abdomen is soft.  Musculoskeletal:     Cervical back: Normal range of motion.  Skin:    General: Skin is warm and dry.     Capillary Refill: Capillary refill takes less than 2 seconds.  Neurological:     Mental Status: She is alert and oriented to person, place, and time.  Psychiatric:        Behavior: Behavior normal.     Musculoskeletal Exam: C-spine has slightly limited ROM with  lateral rotation.  Thoracic and lumbar spine good ROM.  Shoulder joints, elbow joints, wrist joints, MCPs, PIPs, and DIPs good ROM with no synovitis.  Hip joints have good ROM with no groin pain.  No tenderness over trochanteric bursa.  Right knee joint has painful ROM with some fullness but no warmth.  Left knee has good ROM with no warmth or effusion.  Ankle joints have good ROM with some tenderness of the right ankle joint.   CDAI Exam: CDAI Score: -- Patient Global: --; Provider Global: -- Swollen: --; Tender: -- Joint Exam 05/02/2021   No joint exam has been documented for this visit   There is currently no information documented on the homunculus. Go to the Rheumatology activity and complete the homunculus joint exam.  Investigation: No additional findings.  Imaging: No results found.  Recent Labs: Lab Results  Component Value Date  WBC 10.5 03/15/2017   HGB 14.6 03/15/2017   PLT 439 (H) 03/15/2017   NA 140 03/15/2017   K 4.8 03/15/2017   CL 103 03/15/2017   CO2 31 03/15/2017   GLUCOSE 104 (H) 03/15/2017   BUN 12 03/15/2017   CREATININE 0.73 03/15/2017   BILITOT 0.3 01/24/2021   ALKPHOS 72 01/24/2021   AST 22 01/24/2021   ALT 15 01/24/2021   PROT 6.9 01/24/2021   ALBUMIN 4.1 01/24/2021   CALCIUM 10.0 03/15/2017   GFRAA 101 03/15/2017    Speciality Comments: No specialty comments available.  Procedures:  Large Joint Inj: R knee on 05/02/2021 2:33 PM Indications: pain Details: 27 G 1.5 in needle, medial approach  Arthrogram: No  Medications: 1.5 mL lidocaine 1 %; 40 mg triamcinolone acetonide 40 MG/ML Aspirate: 0 mL Outcome: tolerated well, no immediate complications Procedure, treatment alternatives, risks and benefits explained, specific risks discussed. Consent was given by the patient. Immediately prior to procedure a time out was called to verify the correct patient, procedure, equipment, support staff and site/side marked as required. Patient was prepped  and draped in the usual sterile fashion.    Allergies: Latex, Pseudoephedrine, Sulfa antibiotics, and Sulfonamide derivatives   Assessment / Plan:     Visit Diagnoses: Fibromyalgia: She has not had any recent fibromyalgia flares.  She experiences occasional myalgias and muscle tenderness due to fibromyalgia.  She takes tizanidine 4 mg at bedtime as needed for muscle spasms and insomnia.  She does not need a refill of tizanidine at this time.  Overall her symptoms have been manageable.  Discussed the importance of regular exercise and good sleep hygiene.  She will follow-up in the office in 6 months.  Other fatigue: Chronic but stable.  Discussed the importance of regular exercise.  Other insomnia - She takes zanaflex 4 mg at bedtime for muscle spasms and insomnia.   ILD (interstitial lung disease) ([place]) - Dyspnea started summer 2020.  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  Followed closely by Dr. Chase Caller.  High-resolution chest CT 05/31/2020.  PFTs updated on 01/24/2021.  Patient has family history of rheumatoid arthritis.  Rheumatoid factor and ANA have been negative in the past: 11/19/2018.  The following lab work was updated today due to the patient presenting with increased pain and intermittent swelling in her right knee.  We will call her with the results once completed.  Primary osteoarthritis of both knees: She presents today with increased pain in the right knee joint.  She has some fullness in the right knee but no effusion or warmth was noted.  X-rays of the right knee were obtained today and the right knee was injected with cortisone.  The left knee joint had good range of motion on examination with no warmth or effusion.  Discussed the importance of lower extremity muscle strengthening.  Chronic pain of right knee - She presents today with increased discomfort in her right knee joint.  She states that her discomfort initially started in 2021 at which  time she was walking and felt a pop in her right knee.  She has not had any recent injuries or falls but over the past several months her pain has been severe.  She currently rates her pain a 10 out of 10.  Her pain is started to radiate down her right leg when she has noticed intermittent swelling.  On examination she has good range of motion of the right knee joint with some  discomfort.  Some fullness in the right knee but no effusion or warmth was noted.  He has some tenderness over the right ankle joint but no synovitis was noted.  No calf tightness or tenderness was noted. X-rays of the right knee were obtained on 09/05/2019 which revealed mild osteoarthritis. X-rays of the right knee were updated today.  The following lab work will also be updated today.  She has a known family history of rheumatoid arthritis and was diagnosed with pulmonary fibrosis in the past.   Different treatment options were discussed today in detail.  The patient requested a right knee joint cortisone injection.  She tolerated procedure well.  Procedure note was completed above.  Aftercare was discussed. She was advised to notify us if her symptoms persist or worsen at which time we can refer her to orthopedics for order an MRI of the right knee for further evaluation. She was given a handout of knee joint exercises to perform once her symptoms have improved.  Discussed the importance of lower extremity muscle strengthening.Plan: XR KNEE 3 VIEW RIGHT, Large Joint Inj: R knee, 14-3-3 eta Protein, Cyclic citrul peptide antibody, IgG, Rheumatoid factor, Sedimentation rate, C-reactive protein  Primary osteoarthritis of both feet: She has some tenderness over the right ankle joint but no inflammation was noted.    DDD (degenerative disc disease), cervical: She has slightly limited range of motion with lateral rotation.  DDD (degenerative disc disease), thoracic - Thoracic kyphosis noted.  Unchanged.  DDD (degenerative disc  disease), lumbar: She experiences occasional discomfort in her lower back.  No symptoms of radiculopathy at this time.  Osteopenia of multiple sites - DEXA updated on 08/12/19: L1-L4 BMD 0.813 with T-score -2.1. DXA ordered by PCP. Thoracic kyphosis noted. No history of fractures.  DEXA will be due in May 2023.    Other medical conditions are listed as follows:   Family history of rheumatoid arthritis - Maternal grandmother.  Pain in right foot: Resolved.   History of hypercholesterolemia  History of depression  History of migraine  History of hypertension  History of sleep apnea   Orders: Orders Placed This Encounter  Procedures   Large Joint Inj: R knee   XR KNEE 3 VIEW RIGHT   14-3-3 eta Protein   Cyclic citrul peptide antibody, IgG   Rheumatoid factor   Sedimentation rate   C-reactive protein   No orders of the defined types were placed in this encounter.    Follow-Up Instructions: Return in about 6 months (around 10/30/2021) for Fibromyalgia, Osteoarthritis.   Ofilia Neas, PA-C  Note - This record has been created using Dragon software.  Chart creation errors have been sought, but may not always  have been located. Such creation errors do not reflect on  the standard of medical care.

## 2021-05-02 ENCOUNTER — Ambulatory Visit: Payer: Self-pay

## 2021-05-02 ENCOUNTER — Ambulatory Visit: Payer: Medicare Other | Admitting: Physician Assistant

## 2021-05-02 ENCOUNTER — Other Ambulatory Visit: Payer: Self-pay

## 2021-05-02 ENCOUNTER — Encounter: Payer: Self-pay | Admitting: Physician Assistant

## 2021-05-02 VITALS — BP 125/79 | HR 88 | Ht 66.0 in | Wt 165.8 lb

## 2021-05-02 DIAGNOSIS — R5383 Other fatigue: Secondary | ICD-10-CM

## 2021-05-02 DIAGNOSIS — M170 Bilateral primary osteoarthritis of knee: Secondary | ICD-10-CM

## 2021-05-02 DIAGNOSIS — M797 Fibromyalgia: Secondary | ICD-10-CM

## 2021-05-02 DIAGNOSIS — J849 Interstitial pulmonary disease, unspecified: Secondary | ICD-10-CM

## 2021-05-02 DIAGNOSIS — M19072 Primary osteoarthritis, left ankle and foot: Secondary | ICD-10-CM

## 2021-05-02 DIAGNOSIS — Z8659 Personal history of other mental and behavioral disorders: Secondary | ICD-10-CM

## 2021-05-02 DIAGNOSIS — Z8639 Personal history of other endocrine, nutritional and metabolic disease: Secondary | ICD-10-CM

## 2021-05-02 DIAGNOSIS — M5030 Other cervical disc degeneration, unspecified cervical region: Secondary | ICD-10-CM

## 2021-05-02 DIAGNOSIS — G8929 Other chronic pain: Secondary | ICD-10-CM | POA: Diagnosis not present

## 2021-05-02 DIAGNOSIS — M19071 Primary osteoarthritis, right ankle and foot: Secondary | ICD-10-CM

## 2021-05-02 DIAGNOSIS — M25561 Pain in right knee: Secondary | ICD-10-CM | POA: Diagnosis not present

## 2021-05-02 DIAGNOSIS — G4709 Other insomnia: Secondary | ICD-10-CM | POA: Diagnosis not present

## 2021-05-02 DIAGNOSIS — M5134 Other intervertebral disc degeneration, thoracic region: Secondary | ICD-10-CM

## 2021-05-02 DIAGNOSIS — Z8261 Family history of arthritis: Secondary | ICD-10-CM

## 2021-05-02 DIAGNOSIS — M79671 Pain in right foot: Secondary | ICD-10-CM

## 2021-05-02 DIAGNOSIS — Z8679 Personal history of other diseases of the circulatory system: Secondary | ICD-10-CM

## 2021-05-02 DIAGNOSIS — Z8669 Personal history of other diseases of the nervous system and sense organs: Secondary | ICD-10-CM

## 2021-05-02 DIAGNOSIS — M5136 Other intervertebral disc degeneration, lumbar region: Secondary | ICD-10-CM

## 2021-05-02 DIAGNOSIS — M8589 Other specified disorders of bone density and structure, multiple sites: Secondary | ICD-10-CM

## 2021-05-02 MED ORDER — LIDOCAINE HCL 1 % IJ SOLN
1.5000 mL | INTRAMUSCULAR | Status: AC | PRN
  Administered 2021-05-02: 1.5 mL

## 2021-05-02 MED ORDER — TRIAMCINOLONE ACETONIDE 40 MG/ML IJ SUSP
40.0000 mg | INTRAMUSCULAR | Status: AC | PRN
  Administered 2021-05-02: 40 mg via INTRA_ARTICULAR

## 2021-05-03 NOTE — Progress Notes (Signed)
CRP is elevated-9.3.  ESR WNL.  Anti-CCP is negative.  RF is negative.

## 2021-05-03 NOTE — Progress Notes (Signed)
X-rays of the right knee are consistent with moderate OA.  Please notify the patient.   Please also ask how she is doing after having a cortisone injection yesterday.

## 2021-05-08 LAB — SEDIMENTATION RATE: Sed Rate: 22 mm/h (ref 0–30)

## 2021-05-08 LAB — C-REACTIVE PROTEIN: CRP: 9.3 mg/L — ABNORMAL HIGH (ref <8.0)

## 2021-05-08 LAB — RHEUMATOID FACTOR: Rheumatoid fact SerPl-aCnc: <14 IU/mL (ref <14)

## 2021-05-08 LAB — 14-3-3 ETA PROTEIN: 14-3-3 eta Protein: <0.2 ng/mL (ref <0.2)

## 2021-05-08 LAB — CYCLIC CITRUL PEPTIDE ANTIBODY, IGG: Cyclic Citrullin Peptide Ab: <16 UNITS

## 2021-05-09 NOTE — Progress Notes (Signed)
14-3-3 eta negative. Labs are not consistent with rheumatoid arthritis at this time.

## 2021-05-23 ENCOUNTER — Other Ambulatory Visit: Payer: Self-pay | Admitting: Rheumatology

## 2021-05-23 NOTE — Telephone Encounter (Signed)
Next Visit: 11/01/2021 ? ?Last Visit: 05/02/2021 ? ?Last Fill: 04/25/2021 ? ?Dx: Fibromyalgia ? ?Current Dose per office note on 05/02/2021:  tizanidine 4 mg at bedtime as needed for muscle spasms and insomnia.  ? ?Okay to refill Tizanidine?   ?

## 2021-06-14 ENCOUNTER — Ambulatory Visit: Payer: Medicare Other | Admitting: Internal Medicine

## 2021-06-26 NOTE — Therapy (Incomplete)
?OUTPATIENT PHYSICAL THERAPY FEMALE PELVIC EVALUATION ? ? ?Patient Name: Nichole Reyes ?MRN: [id number] ?DOB:1951/03/05, 71 y.o., female ?Today's Date: 06/26/2021 ? ? ? ?Past Medical History:  ?Diagnosis Date  ? Coronary artery calcification seen on CT scan   ? Essential hypertension   ? Fibromyalgia   ? Idiopathic pulmonary fibrosis ([place])   ? Mixed hyperlipidemia   ? Osteoporosis   ? Type 2 diabetes mellitus ([place])   ? ?Past Surgical History:  ?Procedure Laterality Date  ? ABDOMINAL HYSTERECTOMY  2007  ? CATARACT EXTRACTION Bilateral   ? ?Patient Active Problem List  ? Diagnosis Date Noted  ? ILD (interstitial lung disease) ([place]) 08/26/2019  ? Fibromyalgia syndrome 06/26/2016  ? Other fatigue 06/26/2016  ? Other insomnia 06/26/2016  ? Arthralgia of both knees 06/26/2016  ? Pain in both feet 06/26/2016  ? Primary osteoarthritis of both feet 06/26/2016  ? Primary osteoarthritis of both knees 06/26/2016  ? DDD (degenerative disc disease), cervical 06/26/2016  ? DDD (degenerative disc disease), thoracic 06/26/2016  ? DDD (degenerative disc disease), lumbar 06/26/2016  ? History of hypertension 06/26/2016  ? History of high cholesterol 06/26/2016  ? Other sleep apnea 06/26/2016  ? History of osteopenia 06/26/2016  ? 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 THYROID NODULE, LEFT 01/03/2008  ? HYPERLIPIDEMIA 01/03/2008  ? DEPRESSION 01/03/2008  ? MIGRAINE HEADACHE 01/03/2008  ? ALLERGIC RHINITIS 01/03/2008  ? ALOPECIA 01/03/2008  ? ALLERGIC ARTHRITIS SITE UNSPECIFIED 01/03/2008  ? ? ?PCP: Lanelle Bal, PA-C ? ?REFERRING PROVIDER: Marylynn Pearson, MD ? ?REFERRING DIAG: N81.4 (ICD-10-CM) - Uterovaginal prolapse, unspecified ? ?THERAPY DIAG:  ?No diagnosis found. ? ?ONSET DATE: *** ? ?SUBJECTIVE:                                                                                                                                                                                          ? ?SUBJECTIVE STATEMENT: ?*** ?Fluid  intake: {Yes/No:304960894}  ? ?Patient confirms identification and approves PT to assess pelvic floor and treatment {yes/no:20286} ? ? ?PAIN:  ?Are you having pain? {yes/no:20286} ?NPRS scale: ***/10 ?Pain location: {pelvic pain location:27098} ? ?Pain type: {type:313116} ?Pain description: {PAIN DESCRIPTION:21022940}  ? ?Aggravating factors: *** ?Relieving factors: *** ? ?PRECAUTIONS: {Therapy precautions:24002} ? ?WEIGHT BEARING RESTRICTIONS {Yes ***/No:24003} ? ?FALLS:  ?Has patient fallen in last 6 months? {fallsyesno:27318} ? ?LIVING ENVIRONMENT: ?Lives with: {[facility] lives with:25569::"lives with their family"} ?Lives in: {Lives in:25570} ?Stairs: {opstairs:27293} ?Has following equipment at home: {Assistive devices:23999} ? ?OCCUPATION: *** ? ?PLOF: {PLOF:24004} ? ?PATIENT GOALS *** ? ?PERTINENT HISTORY:  ?***hysterectomy 2007;  ?Sexual abuse: {Yes/No:304960894} ? ?BOWEL MOVEMENT ?Pain with bowel movement: {yes/no:20286} ?  Type of bowel movement:{PT BM type:27100} ?Fully empty rectum: {Yes/No:304960894} ?Leakage: {Yes/No:304960894} ?Pads: {Yes/No:304960894} ?Fiber supplement: {Yes/No:304960894} ? ?URINATION ?Pain with urination: {yes/no:20286} ?Fully empty bladder: {Yes/No:304960894} ?Stream: {PT urination:27102} ?Urgency: {Yes/No:304960894} ?Frequency: *** ?Leakage: {PT leakage:27103} ?Pads: {Yes/No:304960894} ? ?INTERCOURSE ?Pain with intercourse: {pain with intercourse PA:27099} ?Ability to have vaginal penetration:  {Yes/No:304960894} ?[place]: *** ?Marinoff Scale: ***/3 ? ?PREGNANCY ?Vaginal deliveries *** ?Tearing {Yes***/No:304960894} ?C-section deliveries *** ?Currently pregnant {Yes***/No:304960894} ? ?PROLAPSE ?{PT prolapse:27101} ? ? ? ?OBJECTIVE:  ? ?DIAGNOSTIC FINDINGS:  ?*** ? ?PATIENT SURVEYS:  ?{rehab surveys:24030} ? ?PFIQ-7 *** ? ?COGNITION: ? Overall cognitive status: {cognition:24006}   ?  ?SENSATION: ? Light touch: {intact/deficits:24005} ? Proprioception: {intact/deficits:24005} ? ?MUSCLE  LENGTH: ?Hamstrings: Right *** deg; Left *** deg ?Thomas test: Right *** deg; Left *** deg ? ?LUMBAR SPECIAL TESTS:  ?{lumbar special test:25242} ? ?FUNCTIONAL TESTS:  ?{Functional tests:24029} ? ?GAIT: ?Distance walked: *** ?Assistive device utilized: {Assistive devices:23999} ?Level of assistance: {Levels of assistance:24026} ?Comments: *** ? ?POSTURE:  ?*** ? ?LUMBARAROM/PROM ? ?A/PROM A/PROM  ?06/26/2021  ?Flexion   ?Extension   ?Right lateral flexion   ?Left lateral flexion   ?Right rotation   ?Left rotation   ? (Blank rows = not tested) ? ?LE ROM: ? ?{AROM/PROM:27142} ROM Right ?06/26/2021 Left ?06/26/2021  ?Hip flexion    ?Hip extension    ?Hip abduction    ?Hip adduction    ?Hip internal rotation    ?Hip external rotation    ?Knee flexion    ?Knee extension    ?Ankle dorsiflexion    ?Ankle plantarflexion    ?Ankle inversion    ?Ankle eversion    ? (Blank rows = not tested) ? ?LE MMT: ? ?MMT Right ?06/26/2021 Left ?06/26/2021  ?Hip flexion    ?Hip extension    ?Hip abduction    ?Hip adduction    ?Hip internal rotation    ?Hip external rotation    ?Knee flexion    ?Knee extension    ?Ankle dorsiflexion    ?Ankle plantarflexion    ?Ankle inversion    ?Ankle eversion    ? ?PELVIC MMT: ?  ?MMT  ?06/26/2021  ?Vaginal   ?Internal Anal Sphincter   ?External Anal Sphincter   ?Puborectalis   ?Diastasis Recti   ?(Blank rows = not tested) ? ?      PALPATION: ?  General  *** ? ?              External Perineal Exam *** ?              ?              Internal Pelvic Floor *** ? ?TONE: ?*** ? ?PROLAPSE: ?*** ? ?TODAY'S TREATMENT  ?EVAL *** ? ? ?PATIENT EDUCATION:  ?Education details: *** ?Person educated: {Person educated:25204} ?Education method: {Education Method:25205} ?Education comprehension: {Education Comprehension:25206} ? ? ?HOME EXERCISE PROGRAM: ?*** ? ?ASSESSMENT: ? ?CLINICAL IMPRESSION: ?Patient is a *** y.o. *** who was seen today for physical therapy evaluation and treatment for ***.  ? ? ?OBJECTIVE IMPAIRMENTS  {opptimpairments:25111}.  ? ?ACTIVITY LIMITATIONS {activity limitations:25113}.  ? ?PERSONAL FACTORS {Personal factors:25162} are also affecting patient's functional outcome.  ? ? ?REHAB POTENTIAL: {rehabpotential:25112} ? ?CLINICAL DECISION MAKING: {clinical decision making:25114} ? ?EVALUATION COMPLEXITY: {Evaluation complexity:25115} ? ? ?GOALS: ?Goals reviewed with patient? {yes/no:20286} ? ?SHORT TERM GOALS: Target date: {follow up:25551} ? ?*** ?Baseline: ?Goal status: {GOALSTATUS:25110} ? ?2.  *** ?Baseline:  ?Goal status: {GOALSTATUS:25110} ? ?3.  *** ?Baseline:  ?Goal status: {  GOALSTATUS:25110} ? ?4.  *** ?Baseline:  ?Goal status: {GOALSTATUS:25110} ? ?5.  *** ?Baseline:  ?Goal status: {GOALSTATUS:25110} ? ?6.  *** ?Baseline:  ?Goal status: {GOALSTATUS:25110} ? ?LONG TERM GOALS: Target date: {follow up:25551} ? ?*** ?Baseline:  ?Goal status: {GOALSTATUS:25110} ? ?2.  *** ?Baseline:  ?Goal status: {GOALSTATUS:25110} ? ?3.  *** ?Baseline:  ?Goal status: {GOALSTATUS:25110} ? ?4.  *** ?Baseline:  ?Goal status: {GOALSTATUS:25110} ? ?5.  *** ?Baseline:  ?Goal status: {GOALSTATUS:25110} ? ?6.  *** ?Baseline:  ?Goal status: {GOALSTATUS:25110} ? ?PLAN: ?PT FREQUENCY: {rehab frequency:25116} ? ?PT DURATION: {rehab duration:25117} ? ?PLANNED INTERVENTIONS: {rehab planned interventions:25118::"Therapeutic exercises","Therapeutic activity","Neuromuscular re-education","Balance training","Gait training","Patient/Family education","Joint mobilization"} ? ?PLAN FOR NEXT SESSION: *** ? ? ?Jule Ser, PT ?06/26/2021, 7:21 PM ? ?

## 2021-06-27 ENCOUNTER — Ambulatory Visit: Payer: Medicare Other | Admitting: Physical Therapy

## 2021-06-27 ENCOUNTER — Other Ambulatory Visit: Payer: Self-pay | Admitting: Physician Assistant

## 2021-06-27 NOTE — Telephone Encounter (Signed)
Next Visit: 11/01/2021 ? ?Last Visit: 05/02/2021 ? ?Last Fill: 05/23/2021 ? ?Dx: Other insomnia  ? ?Current Dose per office note on 05/02/2021: zanaflex 4 mg at bedtime for muscle spasms and insomnia.  ? ?Okay to refill Zanaflex?  ? ?

## 2021-07-14 ENCOUNTER — Other Ambulatory Visit: Payer: Self-pay | Admitting: Critical Care Medicine

## 2021-07-14 DIAGNOSIS — R0609 Other forms of dyspnea: Secondary | ICD-10-CM

## 2021-07-14 DIAGNOSIS — J849 Interstitial pulmonary disease, unspecified: Secondary | ICD-10-CM

## 2021-07-21 ENCOUNTER — Telehealth: Payer: Self-pay | Admitting: Internal Medicine

## 2021-07-21 DIAGNOSIS — Z5181 Encounter for therapeutic drug level monitoring: Secondary | ICD-10-CM

## 2021-07-21 DIAGNOSIS — J84112 Idiopathic pulmonary fibrosis: Secondary | ICD-10-CM

## 2021-07-21 MED ORDER — PIRFENIDONE 801 MG PO TABS: 801.0000 mg | ORAL_TABLET | Freq: Three times a day (TID) | ORAL | 0 refills | Status: DC

## 2021-07-21 NOTE — Telephone Encounter (Signed)
Rx for Esbriet sent to [facility] for 1 month supply only ?Dose: '801mg'$  three times daily ? ?Called patient to notify that I will send refill after her appt on 08/18/21 ? ?Knox Saliva, PharmD, MPH, BCPS, CPP ?Clinical Pharmacist (Rheumatology and Pulmonology) ?

## 2021-07-21 NOTE — Telephone Encounter (Signed)
Patient states [facility] was suppose to send a refill request for Esbriet to our office, but we have not called in a prescription yet. Patient has 2 weeks left of medication. Patient has an appointment 08/18/2021. ? ?Please advise. ? ?

## 2021-08-08 ENCOUNTER — Other Ambulatory Visit: Payer: Self-pay | Admitting: Internal Medicine

## 2021-08-08 DIAGNOSIS — R0609 Other forms of dyspnea: Secondary | ICD-10-CM

## 2021-08-08 DIAGNOSIS — J849 Interstitial pulmonary disease, unspecified: Secondary | ICD-10-CM

## 2021-08-08 DIAGNOSIS — K219 Gastro-esophageal reflux disease without esophagitis: Secondary | ICD-10-CM

## 2021-08-17 ENCOUNTER — Other Ambulatory Visit: Payer: Self-pay

## 2021-08-17 DIAGNOSIS — J849 Interstitial pulmonary disease, unspecified: Secondary | ICD-10-CM

## 2021-08-18 ENCOUNTER — Telehealth: Payer: Self-pay | Admitting: Internal Medicine

## 2021-08-18 ENCOUNTER — Ambulatory Visit: Payer: Medicare Other | Admitting: Internal Medicine

## 2021-08-18 ENCOUNTER — Ambulatory Visit (INDEPENDENT_AMBULATORY_CARE_PROVIDER_SITE_OTHER): Payer: Medicare Other | Admitting: Internal Medicine

## 2021-08-18 ENCOUNTER — Encounter: Payer: Self-pay | Admitting: Internal Medicine

## 2021-08-18 VITALS — BP 120/74 | HR 84 | Ht 65.0 in | Wt 166.2 lb

## 2021-08-18 DIAGNOSIS — J849 Interstitial pulmonary disease, unspecified: Secondary | ICD-10-CM

## 2021-08-18 DIAGNOSIS — Z5181 Encounter for therapeutic drug level monitoring: Secondary | ICD-10-CM

## 2021-08-18 DIAGNOSIS — K219 Gastro-esophageal reflux disease without esophagitis: Secondary | ICD-10-CM | POA: Diagnosis not present

## 2021-08-18 DIAGNOSIS — J84112 Idiopathic pulmonary fibrosis: Secondary | ICD-10-CM

## 2021-08-18 DIAGNOSIS — E785 Hyperlipidemia, unspecified: Secondary | ICD-10-CM

## 2021-08-18 DIAGNOSIS — T50905A Adverse effect of unspecified drugs, medicaments and biological substances, initial encounter: Secondary | ICD-10-CM

## 2021-08-18 DIAGNOSIS — R0609 Other forms of dyspnea: Secondary | ICD-10-CM

## 2021-08-18 DIAGNOSIS — R634 Abnormal weight loss: Secondary | ICD-10-CM

## 2021-08-18 LAB — PULMONARY FUNCTION TEST
DL/VA % pred: 109 %
DL/VA: 4.51 ml/min/mmHg/L
DLCO cor % pred: 78 %
DLCO cor: 15.98 ml/min/mmHg
DLCO unc % pred: 78 %
DLCO unc: 15.98 ml/min/mmHg
FEF 25-75 Pre: 2.65 L/sec
FEF2575-%Pred-Pre: 129 %
FEV1-%Pred-Pre: 78 %
FEV1-Pre: 1.91 L
FEV1FVC-%Pred-Pre: 117 %
FEV6-%Pred-Pre: 69 %
FEV6-Pre: 2.14 L
FEV6FVC-%Pred-Pre: 104 %
FVC-%Pred-Pre: 66 %
FVC-Pre: 2.14 L
Pre FEV1/FVC ratio: 90 %
Pre FEV6/FVC Ratio: 100 %

## 2021-08-18 NOTE — Patient Instructions (Signed)
Spirometry and DLCO Performed Today.  

## 2021-08-18 NOTE — Addendum Note (Signed)
Addended by: Lorretta Harp on: 08/18/2021 04:07 PM   Modules accepted: Orders

## 2021-08-18 NOTE — Progress Notes (Signed)
OV 11/19/2018: Nichole Reyes is a 71 year old woman referred for evaluation of dyspnea for the past several months.  In the same time interval she is also noticed fatigue.  She has previously undergone a negative cardiac evaluation and an outside CT scan of her chest.  Per report, the CT demonstrates fibrotic changes. She is a never- smoker.  She has previously been evaluated by rheumatology, Dr. Estanislado Pandy, for fibromyalgia and osteoarthritis.  She began to notice dyspnea on exertion when exercising in February 2020.  She had previously been able to walk 35 to 40 minutes, but decreased to about 20 minutes at a time due to dyspnea.  She was hospitalized in [place] in June 2020 for dyspnea. At that time she was COVID negative.  She was discharged on albuterol, which she has been using up to 4 times a day, which improves her symptoms.  There is no family history of fibrotic lung disease; multiple family members had COPD.  Her maternal grandmother had rheumatoid arthritis, but otherwise there is no family history of rheumatologic disease.  In addition to dyspnea on exertion with occasional dyspnea at rest she endorses cough and sputum production.  She denies wheezing, postnasal drip, coughing or choking when eating, Raynaud's, muscle weakness, rashes, dry eyes or mouth, joint swelling, prolonged morning stiffness.  She has occasional hand stiffness throughout the day, not associated with rest.  She has a hiatal hernia with occasional GERD with symptoms less than once a week, associated with certain foods.  She is a never smoker or vaper.  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  She has never worked in [employer] or shipyards.  She does not have any dusty hobbies.  She has a Programmer, systems, but has never had pet birds.  She does not regularly use hot tubs.  She has never had exposure to medical radiation, methotrexate,  Macrobid, amiodarone, bleomycin.  Her only new medication was Zetia last month for hypertriglyceridemia.  Otherwise she has not had medication changes in greater than a year.  Last month she has had 6 episodes of epistaxis, which has improved with use of intranasal steroids prescribed by her PCP.     OV 05/06/2019  Subjective:  Patient ID: Nichole Reyes, female , DOB: Sep 07, 1950 , age 11 y.o. , MRN: [id number] , ADDRESS: [place] [address]   05/06/2019 -   Chief Complaint  Patient presents with   Follow-up    Pt states she has both good days and bad days with breathing. Pt states she will occ have to use her rescue inhaler at least 1-2 times with activities.     HPI Nichole Reyes 71 y.o. -referred by Dr. 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  She was first seen by Dr. Carlis Abbott in September 2020 noted above.  She followed up in December 2020 and had pulmonary function test.  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  Due to insurance delays she has not started the nintedanib yet.  She is wondering about this.  She has specific questions about taking Covid vaccine.  She is also worried about her significant cough.  Review shows she is on ACE inhibitor.  She has a tickle in the throat.  She has acid reflux and is on Prilosec.  She has a history of  hiatal hernia.  She is taken a decision on starting nintedanib but she is interested in hearing about the other antifibrotic pirfenidone because of insurance delay she has not started on anything as yet.   [facility] Comprehensive ILD Questionnaire  Symptoms:  -Insidious onset of shortness of breath 7 months ago.  Since it started it has been the same.  She also has episodic amount of dyspnea.  The severity classification is below.  She does not know when her cough started but she does have a cough associated with clearing of the throat.  It is  moderate in intensity.  She does cough at night.  She does bring up phlegm.  The phlegm is usually white but sometimes yellow.  She does feel a tickle in her throat but does not wake up in the middle of the night because of the cough.  There is no wheezing.  Cough is associated with ACE inhibitor intake.    Past Medical History : Positive for fibromyalgia, hiatal hernia, sleep apnea both for the last few years.  There is also borderline diabetes..  And hyperlipidemia   she denies any asthma or COPD or heart failure rheumatoid arthritis or scleroderma or lupus.  Denies any polymyositis.  Denies Sjogren's.  Denies HIV.  Denies pulmonary hypertension.  Denies thyroid disease or stroke.  Denies seizures or mononucleosis or hepatitis or tuberculosis.  Denies kidney disease.  Denies pneumonia.  Denies history of blood clots or heart disease or pleurisy.  Autoimmune serology sept 2020  - normal Results for [name], [name] (MRN [id number]) as of 05/06/2019 12:01  Ref. Range 11/19/2018 14:58  SEE BELOW Unknown Comment  Anti Nuclear Antibody (ANA) Latest Ref Range: Negative  Negative  Anti JO-1 Latest Ref Range: 0.0 - 0.9 AI <0.2  CENTROMERE AB SCREEN Latest Ref Range: 0.0 - 0.9 AI <0.2  dsDNA Ab Latest Ref Range: 0 - 9 IU/mL <1  ENA RNP Ab Latest Ref Range: 0.0 - 0.9 AI <0.2  ENA SSA (RO) Ab Latest Ref Range: 0.0 - 0.9 AI <0.2  ENA SSB (LA) Ab Latest Ref Range: 0.0 - 0.9 AI <0.2  ENA SM Ab Ser-aCnc Latest Ref Range: 0.0 - 0.9 AI <0.2  Chromatin Ab SerPl-aCnc Latest Ref Range: 0.0 - 0.9 AI <0.2  Anti-Jo-1 Ab (RDL) Latest Ref Range: <20 Units <20  Anti-PL-7 Ab (RDL) Unknown CANCELED  Anti-PL-12 Ab (RDL) Unknown CANCELED  Anti-EJ Ab (RDL) Unknown CANCELED  Anti-OJ Ab (RDL) Unknown CANCELED  Anti-SRP Ab (RDL) Unknown CANCELED  Anti-Mi-2 Ab (RDL) Unknown CANCELED  Anti-TIF-1gamma Ab (RDL) Latest Units: Units CANCELED  Anti-MDA-5 Ab (CADM-140)(RDL) Latest Units: Units CANCELED  Anti-NXP-2 (P140) Ab  (RDL) Latest Units: Units CANCELED  Anti-SAE1 Ab, IgG (RDL) Latest Units: Units CANCELED  Anti-PM/Scl-100 Ab (RDL) Latest Ref Range: <20 Units <20  Anti-Ku Ab (RDL) Unknown CANCELED  Anti-SS-A 52kD Ab, IgG (RDL) Latest Ref Range: <20 Units <20  Anti-U1 RNP Ab (RDL) Latest Units: Units CANCELED    ROS: She has fatigue for the last several years and also arthralgia.  She attributes this to fibromyalgia.  She also has pain in her fingers which she attributes due to fibromyalgia.  She has heartburn from hiatal hernia she says.  She also snores because of her sleep apnea and she has a rash related to eczema.  But otherwise negative.   FAMILY HISTORY of LUNG DISEASE: She thinks her dad might have had COPD and her grandfather.  However nobody with pulmonary fibrosis or  other lung diseases.   EXPOSURE HISTORY: Denies ever having smoked cigarettes although she grew up in a home with her dad smoked.  Therefore positive for passive smoking as a child.  Denies cigar use.  Denies marijuana use denies cocaine use denies IV drug use.  Denies vaping.   HOME and HOBBY DETAILS : [place]  She is lived in this home for 22 years.  The home is 69 years old.  She has noticed mildew periodically in the shower curtain and in the bathroom.  She does use a CPAP mask but there is no water circuit mold or mildew in it.  She does do gardening with the flower garden she does use a steam iron but there is no mold or mildew in it.  There is no pet birds.  No feather pillow or duvet.  No pet gerbils.  No mold in the [facility] duct.  Does not play any wind instruments.  Does not use a humidifier.  Does not have a Jacuzzi in the house.   OCCUPATIONAL HISTORY (122 questions) : Essentially negative.  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   PULMONARY TOXICITY HISTORY (27 items): Negative except prednisone use    Results for MELENIE, MINNIEAR (MRN [id number]) as of 05/06/2019 12:01  Ref. Range 11/19/2018 but ? Done 02/28/2019 14:57  FVC-Pre Latest Units: L 2.29  FVC-%Pred-Pre Latest Units: % 70   Results for Nichole, Reyes (MRN [id number]) as of 05/06/2019 12:01  Ref. Range 11/19/2018 but ? Done 02/28/2019 14:57  DLCO unc Latest Units: ml/min/mmHg 15.75  DLCO unc % pred Latest Units: % 77    She had a high-resolution CT chest September 24, 2018 at [facility].  This was read by our radiologist Dr Eddie Candle..  I do not have the image to visualized but I trust this report.  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  In addition is described a 1.4 cm right upper lobe posterior segment inferior part nodule that is subpleural and also 8 mm subpleural right middle lobe nodule.  He says these findings are unchanged compared to 2008 and are benign.  OV 06/03/2019 -telephone visit.  Patient identified with 2 person identifier.  Risks, benefits and limitations of telephone visit explained.  Subjective:  Patient ID: Nichole Reyes, female , DOB: November 07, 1950 , age 60 y.o. , MRN: [id number] , ADDRESS: [address] [place] [address]   Clinical IPF diagnosis given dec 2020 and reiterated Mid Feb 2021:  based on your age greater than 65, clubbing on exam, CT scan with high probability for this condition, negative history on your questionnaire and negative blood test on your serology exams   06/03/2019 -  Fu IPF - focus on esbriet monitoring and any other questions she might have   HPI Nichole Reyes 71 y.o. - on Esbriet now. Started it 05/22/2019 Thursday. Currently on 2 pills tid. Taking it with food. Spacing 5-6h between dosing. No side effects at this point.  From a dyspnea standpoint she is stable.  She had questions about being able to do gardening.  We discussed the fact this could cause mold  exposure and organic dust exposure that could irritate her IPF.  She says she can do it with masking.  Gardening gives her purpose so I supported it.  She also plan [place] last week and was quite short of breath and took an inhaler.  I have suggested that she use a pulse ox meter and monitor her pulse ox and call us if her pulse ox below 88% so we can order portable oxygen [obviously will have to do stress testing submaximal 6-minute walk test] otherwise doing well.    OV 12/16/2019   Subjective:  Patient ID: Nichole Reyes, female , DOB: 1950-08-26, age 71 y.o. years. , MRN: [id number],  ADDRESS: [place] [address] PCP  Rory Percy, MD Providers : Treatment Team:  Attending Provider: Brand Males, MD   Chief Complaint  Patient presents with   Follow-up    Tired all the time. redness/itching to bilateral nick and insides of  arms.  stress/heat?     Clinical IPF diagnosis given dec 2020 and reiterated Mid Feb 2021:  based on your age greater than 65, clubbing on exam, CT scan with high probability for this condition, negative history on your questionnaire and negative blood test on your serology exams . - on Esbriet now. Started it 05/22/2019   GERD/Hiatal Hernia  HPI Nichole Reyes 71 y.o. -returns for follow-up.  Last seen in early part of 2021.  After that she see nurse practitioner 2 times once or at least telephone visit.  She tried it in pulmonary rehabilitation for IPF but she had logistical issues.  Early morning she has to get her husband ready for work.  Late in the afternoon she has to pick up her granddaughter.  She also has knee issues from a fibromyalgia.  Therefore is not able to do pulmonary rehabilitation.  She is very compliant with her pirfenidone.  She feels she is tolerating it fine but she did admit to low appetite and also has a 7 pound weight loss.  She is also fatigued.  She thinks the fatigue is present for the last few months.  She  thinks it might be related to her daughter's wedding but then the fatigue has been there for the last few months while the daughter's wedding happened a few weeks ago.  She is not on oxygen.  Walking desaturation test is stable.  Most recent pulmonary function test is stable.  Symptom score other than fatigue is also stable.  She continues PPI for her acid reflux.      OV 05/06/2020  Subjective:  Patient ID: Nichole Reyes, female , DOB: Mar 15, 1951 , age 71 y.o. , MRN: [id number] , ADDRESS: [place] [address] PCP Rory Percy, MD Patient Care Team: Rory Percy, MD as PCP - General (Family Medicine) Herminio Commons, MD (Inactive) as PCP - Cardiology (Cardiology)  This Provider for this visit: Treatment Team:  Attending Provider: Brand Males, MD    05/06/2020 -   Chief Complaint  Patient presents with   Follow-up    SOB with exertion    Clinical IPF diagnosis given dec 2020 and reiterated Mid Feb 2021:  based on your age greater than 65, clubbing on exam, CT scan with high probability for this condition, negative history on your questionnaire and negative blood test on your serology exams . - on Esbriet now. Started it 05/22/2019   GERD/Hiatal Hernia  Last CT scan of the chest July 2020 at [facility] with probable UIP Last PFT June 2021  HPI Nichole Reyes 71 y.o. -presents for follow-up.  Last seen in  September 2021.  Since then she continues to deal with her right knee issue.  She is seen Dr. Bo Merino for this.  Apparently a knee MRI is pending.  She is continuing to lose weight.  She has lost 10 pounds in the last 1 year.  This correlates with starting pirfenidone.  Technically she is still overweight.  She is also reporting associated fatigue and metallic taste.  A few months ago she made a call and we told her to hold the pirfenidone.  After that the metallic taste largely resolved but it does come back although to a much lesser severity.   At this point in time it does not bother her too much so she think she will continue to take pirfenidone.  From a respiratory standpoint she feels stable although she would say that in the last 1 year she feels the disease might be slowly progressive.  Her symptom score shows slight worsening especially with shortness of breath and weight loss but same fatigue.  Some nausea with pirfenidone.  He was supposed to pulmonary function testing today but for some reason has not happened.  Last PFT was June 2021.  Last CT scan of the chest was July 2020 I do not have echocardiogram in our system.  She did have a low risk nuclear medicine stress test with 59% in June 2020   [place] 07/13/2020  Subjective:  Patient ID: Nichole Reyes, female , DOB: Sep 25, 1950 , age 30 y.o. , MRN: [id number] , ADDRESS: [place] [address] PCP Lanelle Bal, PA-C Patient Care Team: Lanelle Bal, PA-C as PCP - General (Family Medicine) Herminio Commons, MD (Inactive) as PCP - Cardiology (Cardiology)  This Provider for this visit: Treatment Team:  Attending Provider: Brand Males, MD    07/13/2020 -   Chief Complaint  Patient presents with   Follow-up    No concerns, here to get PFT results.    Clinical IPF diagnosis given dec 2020 and reiterated Mid Feb 2021:  based on your age greater than 65, clubbing on exam, CT scan with high probability for this condition, negative history on your questionnaire and negative blood test on serology exams . - Oon Esbriet since 05/22/2019   GERD/Hiatal Hernia  Last PFT April 2022 Last CT scan of the chest March 2022   HPI Nichole Reyes 71 y.o. -returns for follow-up to discuss her test results.  She definitely has more shortness of breath now than she did in September 2021.  She continues to lose weight.  But her BMI is still 27.  No metallic taste no nausea vomiting or diarrhea.  No chest pains.  Her pulmonary function test shows slight decline in DLCO but  FVC is stable.  Walking desaturation to stable.  High-resolution CT chest shows stability in ILD compared to 2020.  Therefore overall she is probably stable.  There is evidence of coronary artery calcification and also aortic valve calcifications on the CT.  Her echo showed mild diastolic dysfunction that could be contributing to the dyspnea.  There is also mild aortic valve sclerosis.  There is no chest pain with exertion.  Her last cardiologist in [place] is no longer in the health system.  She is not seen a cardiologist in 7 years.  Does not recollect when her last stress test was.   WE discussed   1. Scientific Purpose  Clinical research is designed to produce generalizable knowledge and to answer questions about the safety and efficacy  of intervention(s) under study in order to determine whether or not they may be useful for the care of future patients.  2. Study Procedures  Participation in a trial may involve procedures or tests, in addition to the intervention(s) under study, that are intended only or primarily to generate scientific knowledge and that are otherwise not necessary for patient care.   3. Uncertainty  For intervention(s) under study in clinical research, there often is less knowledge and more uncertainty about the risks and benefits to a population of trial participants than there is when a doctor offers a patient standard interventions.   4. Adherence to Protocol  Administration of the intervention(s) under study is typically based on a strict protocol with defined dose, scheduling, and use or avoidance of concurrent medications, compared to administration of standard interventions.  5. Clinician as Investigator  Clinicians who are in health care settings provide treatment; in a clinical trial setting, they are also investigating safety and efficacy of an intervention. In otherwise your doctor or nurse practitioner can be wearing 2 hats - one as care giver another as  Company secretary  6. Patient as Visual merchandiser Subject  Patients participating in research trials are research subjects or volunteers. In other words participating in research is 100% voluntary and at one's own free weill. The decision to participate or not participate will NOT affect patient care and the doctor-patient relationship in any way   ECHO 06/29/20  IMPRESSIONS     1. Left ventricular ejection fraction, by estimation, is 60 to 65%. The  left ventricle has normal function. The left ventricle has no regional  wall motion abnormalities. There is mild left ventricular hypertrophy.  Left ventricular diastolic parameters  are consistent with Grade I diastolic dysfunction (impaired relaxation).   2. Right ventricular systolic function is normal. The right ventricular  size is normal. There is normal pulmonary artery systolic pressure. The  estimated right ventricular systolic pressure is 41.9 mmHg.   3. The mitral valve is grossly normal. Trivial mitral valve  regurgitation.   4. The aortic valve is tricuspid. Aortic valve regurgitation is not  visualized. Mild aortic valve sclerosis is present, with no evidence of  aortic valve stenosis.   5. The inferior vena cava is normal in size with greater than 50%  respiratory variability, suggesting right atrial pressure of 3 mmHg.   Comparison(s): No prior Echocardiogram.    CT Chest data march 22022  Narrative & Impression  CLINICAL DATA:  71 year old female with history of chronic dyspnea. Fibrotic changes noted on prior CT examination. Follow-up study.   EXAM: CT CHEST WITHOUT CONTRAST   TECHNIQUE: Multidetector CT imaging of the chest was performed following the standard protocol without intravenous contrast. High resolution imaging of the lungs, as well as inspiratory and expiratory imaging, was performed.   COMPARISON:  No priors available for comparison.   FINDINGS: Cardiovascular: Heart size is normal.  There is no significant pericardial fluid, thickening or pericardial calcification. There is aortic atherosclerosis, as well as atherosclerosis of the great vessels of the mediastinum and the coronary arteries, including calcified atherosclerotic plaque in the left main, left anterior descending, left circumflex and right coronary arteries. Calcifications of the aortic valve.   Mediastinum/Nodes: No pathologically enlarged mediastinal or hilar lymph nodes. Please note that accurate exclusion of hilar adenopathy is limited on noncontrast CT scans. Esophagus is unremarkable in appearance. No axillary lymphadenopathy.   Lungs/Pleura: High-resolution images demonstrate widespread areas of peripheral predominant ground-glass attenuation, septal thickening,  mild cylindrical bronchiectasis and peripheral bronchiolectasis. Findings have a mild craniocaudal gradient. No definitive honeycombing is identified. Inspiratory and expiratory imaging is unremarkable. No acute consolidative airspace disease. No pleural effusions. In the periphery of the right middle lobe (axial image 89 of series 6) there is a 7 x 5 mm (mean diameter of 6 mm) subpleural pulmonary nodule (stable compared to prior study 09/24/2018). In the superior segment of the right lower lobe (axial image 73 of series 6) there is a 2.3 x 0.8 cm elongated nodular area of architectural distortion which is intimately associated with an accessory fissure, which appears slightly larger on axial images, but is completely stable in size and appearance when viewed on sagittal reconstructions.   Upper Abdomen: Aortic atherosclerosis.   Musculoskeletal: There are no aggressive appearing lytic or blastic lesions noted in the visualized portions of the skeleton.   IMPRESSION: 1. 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 Repeat high-resolution chest CT is recommended in 12 months to assess for temporal changes in the appearance of the lung parenchyma. 2. Pulmonary nodules in the right lung are stable compared to the prior study from 2020, likely benign. Attention at time of repeat high-resolution chest CT is recommended to ensure continued stability. 3. Aortic atherosclerosis, in addition to left main and 3 vessel coronary artery disease. Please note that although the presence of coronary artery calcium documents the presence of coronary artery disease, the severity of this disease and any potential stenosis cannot be assessed on this non-gated CT examination. Assessment for potential risk factor modification, dietary therapy or pharmacologic therapy may be warranted, if clinically indicated. 4. There are calcifications of the aortic valve. Echocardiographic correlation for evaluation of potential valvular dysfunction may be warranted if clinically indicated.   Aortic Atherosclerosis (ICD10-I70.0).   Electronically Signed: By: Vinnie Langton M.D. On: 06/01/2020 09:51  ADDENDUM REPORT: 06/21/2020 14:33   ADDENDUM: It was brought to my attention that there is a prior chest CT from 09/24/2018. After comparison between the 05/31/2020 exam and the 09/24/2018 exam, there is no evidence of progression. Assessment remains unchanged. Specifically, based on the spectrum of findings this continues to be categorized as probable usual interstitial pneumonia (UIP) per current ATS guidelines.     Electronically Signed   By: Vinnie Langton M.D.   On: 06/21/2020 14:33        OV 01/24/2021  Subjective:  Patient ID: Nichole Reyes, female , DOB: [birth year] , age 23 y.o. , MRN: [id number] , ADDRESS: [address] [place] [address] PCP Lanelle Bal, PA-C Patient Care Team: Lanelle Bal, PA-C as PCP - General (Family Medicine) Satira Sark, MD as PCP - Cardiology  (Cardiology)  This Provider for this visit: Treatment Team:  Attending Provider: Brand Males, MD    01/24/2021 -   Chief Complaint  Patient presents with   Follow-up    F/U after PFT. States her breathing has been stable since last visit with SOB with exertion.        HPI Nichole Reyes 71 y.o. -returns for follow-up.  In this visit she has had pulmonary function test.  It shows a 7% decline in FVC and DLCO in 2 years.  Is roughly 3.5 %/year.  She is actually having more shortness of breath.  She did see cardiology and had a normal Myoview stress test.  She has been reassured  from a cardiology standpoint.  But has dyspnea score is worse.  She is unable to do pulmonary rehabilitation because of issues with family and also because she has osteoarthritis of her knees.  She is seeing rheumatology for this.  She says she has never seen orthopedic for this.  Did indicate to her to talk to rheumatology and see if at this time to do a knee replacement and if she needs an orthopedic referral.  Did indicate that at some point the ILD could get worse and she would be quite dyspneic and unable to mobilize.  But at this point from a safety standpoint would be a good point in time to do a knee replacement if the knees are really bad.  She understood this.  We did discuss worsening.  She is on maximal standard of care.  We did discuss clinical trials as a care option she is interested.  She has been losing weight with pirfenidone there is more weight loss.  Her BMI is 27 and technically she is still overweight.  We decided to continue with pirfenidone.  She will have a liver function test today.  She will have a high-dose flu shot today.     OV 08/18/2021  Subjective:  Patient ID: Nichole Reyes, female , DOB: [birth year] , age 22 y.o. , MRN: [id number] , ADDRESS: [address] [place] [address] PCP Lanelle Bal, PA-C Patient Care Team: Lanelle Bal, PA-C as PCP - General (Family  Medicine) Satira Sark, MD as PCP - Cardiology (Cardiology)  This Provider for this visit: Treatment Team:  Attending Provider: Brand Males, MD    08/18/2021 -   Chief Complaint  Patient presents with   Follow-up    PFT performed today.  Pt states she has been doing okay since last visit. States she gets nauseous every time she takes her Esbriet and also will become SOB with exertion.   Clinical IPF diagnosis given dec 2020 and reiterated Mid Feb 2021:  based on your age greater than 65, clubbing on exam, CT scan with high probability for this condition, negative history on your questionnaire and negative blood test on serology exams . - Oon Esbriet since 05/22/2019   GERD/Hiatal Hernia  Last PFT  June 2023 Last CT scan of the chest March 2022   DJD/OA knee  Drug induced weihgt loss -due to pirfenidone  Grade 1 diastolic dysfunction with normal pulmonary artery pressure  -Last echo April 2022  Hyperlipidemia  HPI Nichole Reyes 71 y.o. -returns for follow-up.  At this visit she continues to be stable in terms of previous weight loss but also dyspnea on exertion although this time she tells me that the dyspnea is bothering her though the symptom score is stable.  Her pulmonary function test shows overall slow decline over a long period of time but most recently the 2 studies are stable.  She appears to get dyspneic while walking the dog.  She is looking for measures to help her.  Did indicate to her about deconditioning.  She has previously been to pulmonary rehabilitation.  Her schedule a does not allow her to go to rehab.  We talked about doing a 6-minute walk test to look for exertional desaturation and seeing if she would qualify for oxygen.  She is willing to do this.    The more important thing is that she is having significant nausea.  In fact the nausea is worse now.  All of a sudden for the  last few months.  She started taking over-the-counter Emetrol a few days  ago and this seems to help.  She told me that she is spacing her pirfenidone only every 4 hours.  This is what she thought she needed to do.  I did indicate to her that she needs to space it out at least 5 to 6 hours apart.  We will see if this helps the nausea.  She does drink enough fluids.  We went over her medication list.  This been significant changes to her anticholesterol medications.  Since earlier this year she has been on Repatha.  Then a few days ago or a few weeks ago her Zetia got changed back to fenofibrate.  The timing of this medication does not fit in with nausea.  Nevertheless she says she has hyperlipidemia for over 30 years.  Managed with primary care.  She says it is very difficult to control.  Never been seen by lipid specialist.  Willing to see Dr. Kenneth Mali Hilty.   Of note she has been interested in clinical trials but given the current nausea she wants to hold off at this point   SYMPTOM SCALE - ILD 05/06/2019 179# 12/16/2019 172# - esbriet + 05/06/2020 169# - esbriet 07/13/2020 167# - on esbreiet 01/24/2021 164# 08/18/2021 166# - esbriet  O2 use ra       Shortness of Breath 0 -> 5 scale with 5 being worst (score 6 If unable to do)       At rest 4 1 0 '2 3 1  '$ Simple tasks - showers, clothes change, eating, shaving '4 1 2 2 4 3  '$ Household (dishes, doing bed, laundry) '5 5 4 4 5 5  '$ Shopping '5 3 4 3 4 5  '$ Walking level at own pace '5 3 4 3 4 5  '$ Walking up Stairs '5 5 5 4 5 5  '$ Total (30-36) Dyspnea Score '28 15 19 18 25 24  '$ How bad is your cough? 2.5 1 0 0 0 2  How bad is your fatigue  yes '4 3 4 3 4  '$ How bad is nausea x 0 2 0 2 4  How bad is vomiting?  x 0 0 0 0 0  How bad is diarrhea? x 0 0 0 0 0  How bad is anxiety? x 00 '1 2 3 2  '$ How bad is depression x 0 '3 2 2 3      '$ Simple office walk 185 feet x  3 laps goal with forehead probe 05/06/2019  12/16/2019  05/06/2020  07/13/2020  01/24/2021  08/18/2021   O2 used ra ra ra ra ra ra  Number laps completed '3 3 3 '$ goal -  stopped at 2 due to dyspnea 3 and did all '3 3 3  '$ Comments about pace avg mod Slow pace avg    Resting Pulse Ox/HR 98% and 90/min 97% and 77/min 98% and 90/min 96% and 94 98% and 90 100% and 84  Final Pulse Ox/HR 96% and 100/min 97% and 91/mn 94% and 92 at 2nd lap 93% and 103/min 96% and 99 95% and 96  Desaturated </= 88% no no no no no   Desaturated <= 3% points no no Yes,, 4 pint Yes 3 points no   Got Tachycardic >/= 90/min yes yes yes yes    Symptoms at end of test Mod dyspnea, knee pain Dyspnea and fatigue strting at 1st lap and increased by 3rd stoppe early and did drop 3 points  Dyspnea at end  Dyspnea mild to mod, light headed  Miscellaneous comments x        CT Chest data  No results found.    PFT     Latest Ref Rng & Units 08/18/2021    9:16 AM 01/24/2021   10:55 AM 07/13/2020    1:01 PM 08/26/2019    1:39 PM 11/19/2018    2:57 PM  PFT Results  FVC-Pre L 2.14  P 2.12   2.32   2.36   2.29    FVC-Predicted Pre % 66  P 66   72   72   70    FVC-Post L     2.30    FVC-Predicted Post %     70    Pre FEV1/FVC % % 90  P 89   89   88   89    Post FEV1/FCV % %     90    FEV1-Pre L 1.91  P 1.88   2.07   2.08   2.04    FEV1-Predicted Pre % 78  P 77   84   83   82    FEV1-Post L     2.07    DLCO uncorrected ml/min/mmHg 15.98  P 14.63   13.98   15.62   15.75    DLCO UNC% % 78  P 72   68   76   77    DLCO corrected ml/min/mmHg 15.98  P 14.63   13.98   15.62     DLCO COR %Predicted % 78  P 72   68   76     DLVA Predicted % 109  P 114   107   113   118    TLC L     3.44    TLC % Predicted %     66    RV % Predicted %     50      P Preliminary result       has a past medical history of Coronary artery calcification seen on CT scan, Essential hypertension, Fibromyalgia, Idiopathic pulmonary fibrosis (HCC), Mixed hyperlipidemia, Osteoporosis, and Type 2 diabetes mellitus ([place]).   reports that she has never smoked. She has never used smokeless tobacco.  Past Surgical History:   Procedure Laterality Date   ABDOMINAL HYSTERECTOMY  2007   CATARACT EXTRACTION Bilateral     Allergies  Allergen Reactions   Latex    Pseudoephedrine Other (See Comments)    Rash and hallucinations    Sulfa Antibiotics    Sulfonamide Derivatives Rash    Immunization History  Administered Date(s) Administered   Fluad Quad(high Dose 65+) 01/29/2019, 12/16/2019, 01/24/2021   Influenza Split 12/18/2017   Influenza-Unspecified 11/18/2013   PFIZER(Purple Top)SARS-COV-2 Vaccination 05/15/2019, 06/13/2019   Tdap 05/04/1999    Family History  Problem Relation Age of Onset   Hypertension Mother    Diabetes Mother    Osteoporosis Mother    Hypertension Father    Heart disease Father    COPD Father    Colon cancer Sister    Cancer Brother    Heart disease Brother    Heart disease Brother    Emphysema Paternal Grandfather      Current Outpatient Medications:    ACCU-CHEK AVIVA PLUS test strip, , Disp: , Rfl:    Accu-Chek Softclix Lancets lancets, , Disp: , Rfl:    albuterol (VENTOLIN HFA) 108 (90 Base) MCG/ACT inhaler, 2 puffs every 4 (  four) hours as needed. , Disp: , Rfl:    aspirin EC 81 MG tablet, Take 81 mg by mouth daily., Disp: , Rfl:    Calcium Carbonate (CALTRATE 600 PO), Take 1 tablet by mouth 2 (two) times a day., Disp: , Rfl:    clonazePAM (KLONOPIN) 0.5 MG tablet, Take 0.5 mg by mouth daily. , Disp: , Rfl:    fenofibrate (TRICOR) 48 MG tablet, Take 48 mg by mouth daily., Disp: , Rfl:    fluticasone (FLOVENT HFA) 110 MCG/ACT inhaler, INHALE 1 PUFF INTO THE LUNGS 2 TIMES A DAY., Disp: 12 g, Rfl: 3   losartan (COZAAR) 50 MG tablet, Take 50 mg by mouth daily., Disp: , Rfl:    metFORMIN (GLUCOPHAGE-XR) 500 MG 24 hr tablet, Take 500 mg by mouth daily with breakfast. , Disp: , Rfl:    metoprolol succinate (TOPROL-XL) 50 MG 24 hr tablet, Take 50 mg by mouth daily. , Disp: , Rfl:    omeprazole (PRILOSEC) 20 MG capsule, TAKE 1 CAPSULE BY MOUTH ONCE DAILY., Disp: 30 capsule,  Rfl: 5   Pirfenidone (ESBRIET) 801 MG TABS, Take 801 mg by mouth 3 (three) times daily with meals., Disp: 90 tablet, Rfl: 0   REPATHA SURECLICK 355 MG/ML SOAJ, Inject 1 mL into the skin every 14 (fourteen) days., Disp: , Rfl:    SAVELLA 50 MG TABS tablet, Take 50 mg by mouth 2 (two) times daily. , Disp: , Rfl:    tiZANidine (ZANAFLEX) 4 MG tablet, TAKE (1) TABLET EVERY NIGHT AT BEDTIME., Disp: 30 tablet, Rfl: 2   triamcinolone cream (KENALOG) 0.1 %, as needed., Disp: , Rfl:       Objective:   Vitals:   08/18/21 1000  BP: 120/74  Pulse: 84  SpO2: 100%  Weight: 166 lb 3.2 oz (75.4 kg)  Height: '5\' 5"'$  (1.651 m)    Estimated body mass index is 27.66 kg/m as calculated from the following:   Height as of this encounter: '5\' 5"'$  (1.651 m).   Weight as of this encounter: 166 lb 3.2 oz (75.4 kg).  '@WEIGHTCHANGE'$ @  [employer]   08/18/21 1000  Weight: 166 lb 3.2 oz (75.4 kg)     Physical Exam    General: No distress. obese Neuro: Alert and Oriented x 3. GCS 15. Speech normal Psych: Pleasant Resp:  Barrel Chest - no.  Wheeze - no, Crackles - yes, No overt respiratory distress CVS: Normal heart sounds. Murmurs - no Ext: Stigmata of Connective Tissue Disease - no HEENT: Normal upper airway. PEERL +. No post nasal drip        Assessment:       ICD-10-CM   1. IPF (idiopathic pulmonary fibrosis) (HCC)  J84.112 Hepatic function panel    CBC with Differential/Platelet    Basic metabolic panel    B Nat Peptide    Pulmonary function test    6 minute walk    2. DOE (dyspnea on exertion)  R06.09     3. Medication monitoring encounter  Z51.81     4. Gastroesophageal reflux disease, unspecified whether esophagitis present  K21.9     5. Drug-induced weight loss  R63.4    T50.905A     6. Hyperlipidemia, unspecified hyperlipidemia type  E78.5          Plan:     Patient Instructions  IPF (idiopathic pulmonary fibrosis) ([place]) Medication monitoring  encounter Gastroesophageal reflux disease, unspecified whether esophagitis present Dyspnea on exertion  -IPF 7% worse sept 2020 ->  Nov 2022 (this very mild decline) -> stable through June 2023   Lot of nausea with esbriet  - in part due to fact you are taking it 4h  Concerns for dyspnea walking dog and possible desaturations < 88% during that time  Plan - check LFT, cbc, bmet, bnp 08/18/2021 -Continue acid reflux medications -Continue pirfenidone per schedule [side effect issues of nausea discussed]   -At this point in time benefit outweighs risk  -Continue space 5-6h between meals and apply sunscreen when going out -We discussed clinical trials as a care option and appreciate your interest  -meet [facility] to get consent for daewoong study  - return next few weeks for 6 min walk test to   -Do repeat spirometry and DLCO in 3 -4 months  - get echo ([place] post dc)  - consider RHC based on echo, walk ad bnp results - rule out WHO-3 PAH  Nausea due to drug - pirfenidone  Plan  - take emedrol  - drink water  - space 5-6h between ebreit - wil ask Devki to review your medicines to see if anything else is causing nausea  Drug-induced weight loss  -15 pound weight loss but in February 2021 and Nov  2022 most likely because of pirfenidone. STabilized since then -At this point in time is still are technically overweight -Therefore we will leverage pirfenidone and helping you with your weight loss -We will continue to monitor -Do talk to primary care physician about other reasons for weight loss   Hyperlipidemia  - too many meds  Plan  - refer Dr Debara Pickett to see if meds can be simplified  Followup -Return to see  APP in 4 weels  to review nausea and 6 min walk test result  - Return to see DR Chase Caller in 3-4 months - 30 min visit but after spiro/dlco  Complex medical condition requiring intensive therapeutic monitoring  SIGNATURE    Dr. Brand Males, M.D.,  F.C.C.P,  Pulmonary and Critical Care Medicine Staff Physician, [place] Director - Interstitial Lung Disease  Program  [facility] [place], [place], [address]  Pager: [phone], If no answer or between  15:00h - 7:00h: call [phone] Telephone: [phone]  12:43 PM 08/18/2021

## 2021-08-18 NOTE — Telephone Encounter (Signed)
   Devki  Any of this meds other than esbriet can cause nausea?  Thanks    SIGNATURE    Dr. Brand Males, M.D., F.C.C.P,  Pulmonary and Critical Care Medicine Staff Physician, [place] Director - Interstitial Lung Disease  Program  Medical Director - [facility] [place] [facility] [place], [place], [address]  NPI Number:  NPI #[id number] [place] Number: [id number]  Pager: [phone], If no answer  -> Check AMION or Try [phone] Telephone (clinical office): [phone] Telephone (research): [phone]  12:47 PM 08/18/2021      Current Outpatient Medications:    ACCU-CHEK AVIVA PLUS test strip, , Disp: , Rfl:    Accu-Chek Softclix Lancets lancets, , Disp: , Rfl:    albuterol (VENTOLIN HFA) 108 (90 Base) MCG/ACT inhaler, 2 puffs every 4 (four) hours as needed. , Disp: , Rfl:    aspirin EC 81 MG tablet, Take 81 mg by mouth daily., Disp: , Rfl:    Calcium Carbonate (CALTRATE 600 PO), Take 1 tablet by mouth 2 (two) times a day., Disp: , Rfl:    clonazePAM (KLONOPIN) 0.5 MG tablet, Take 0.5 mg by mouth daily. , Disp: , Rfl:    fenofibrate (TRICOR) 48 MG tablet, Take 48 mg by mouth daily., Disp: , Rfl:    fluticasone (FLOVENT HFA) 110 MCG/ACT inhaler, INHALE 1 PUFF INTO THE LUNGS 2 TIMES A DAY., Disp: 12 g, Rfl: 3   losartan (COZAAR) 50 MG tablet, Take 50 mg by mouth daily., Disp: , Rfl:    metFORMIN (GLUCOPHAGE-XR) 500 MG 24 hr tablet, Take 500 mg by mouth daily with breakfast. , Disp: , Rfl:    metoprolol succinate (TOPROL-XL) 50 MG 24 hr tablet, Take 50 mg by mouth daily. , Disp: , Rfl:    omeprazole (PRILOSEC) 20 MG capsule, TAKE 1 CAPSULE BY MOUTH ONCE DAILY., Disp: 30 capsule, Rfl: 5   Pirfenidone (ESBRIET) 801 MG TABS, Take 801 mg by mouth 3 (three) times daily with meals., Disp: 90 tablet, Rfl: 0   REPATHA SURECLICK 450 MG/ML SOAJ, Inject 1 mL into the skin every 14 (fourteen) days., Disp: ,  Rfl:    SAVELLA 50 MG TABS tablet, Take 50 mg by mouth 2 (two) times daily. , Disp: , Rfl:    tiZANidine (ZANAFLEX) 4 MG tablet, TAKE (1) TABLET EVERY NIGHT AT BEDTIME., Disp: 30 tablet, Rfl: 2   triamcinolone cream (KENALOG) 0.1 %, as needed., Disp: , Rfl:

## 2021-08-18 NOTE — Patient Instructions (Addendum)
IPF (idiopathic pulmonary fibrosis) (HCC) Medication monitoring encounter Gastroesophageal reflux disease, unspecified whether esophagitis present Dyspnea on exertion  -IPF 7% worse sept 2020 -> Nov 2022 (this very mild decline) -> stable through June 2023   Lot of nausea with esbriet  - in part due to fact you are taking it 4h  Concerns for dyspnea walking dog and possible desaturations < 88% during that time  Plan - check LFT, cbc, bmet, bnp 08/18/2021 -Continue acid reflux medications -Continue pirfenidone per schedule [side effect issues of nausea discussed]   -At this point in time benefit outweighs risk  -Continue space 5-6h between meals and apply sunscreen when going out -We discussed clinical trials as a care option and appreciate your interest  -meet [facility] to get consent for daewoong study  - return next few weeks for 6 min walk test to   -Do repeat spirometry and DLCO in 3 -4 months  - get echo ([place] post dc)  - consider RHC based on echo, walk ad bnp results - rule out WHO-3 PAH  Nausea due to drug - pirfenidone  Plan  - take emedrol  - drink water  - space 5-6h between ebreit - wil ask Devki to review your medicines to see if anything else is causing nausea  Drug-induced weight loss  -15 pound weight loss but in February 2021 and Nov  2022 most likely because of pirfenidone. STabilized since then -At this point in time is still are technically overweight -Therefore we will leverage pirfenidone and helping you with your weight loss -We will continue to monitor -Do talk to primary care physician about other reasons for weight loss   Hyperlipidemia  - too many meds  Plan  - refer Dr Debara Pickett to see if meds can be simplified  Followup -Return to see  APP in 4 weels  to review nausea and 6 min walk test result  - Return to see DR Chase Caller in 3-4 months - 30 min visit but after spiro/dlco

## 2021-08-18 NOTE — Progress Notes (Signed)
Spirometry and DLCO Performed Today.  

## 2021-08-19 NOTE — Telephone Encounter (Signed)
Patient had OV yesterday. Refill of Esbriet is pending repeat LFTs  Knox Saliva, PharmD, MPH, BCPS, CPP Clinical Pharmacist (Rheumatology and Pulmonology)

## 2021-08-19 NOTE — Telephone Encounter (Signed)
Devki: thanks  Emily: Let patient know that we think esbriet is caues of nause tthough the anti depressant savella played a role priming her for esbriet related nausea  Plan  = she should go with plan of spacing and taking emetron -> if does not work we can do esbriet holiday  - but she should talk top PCP Lanelle Bal, PA-C and see if any alternative for savella

## 2021-08-19 NOTE — Telephone Encounter (Addendum)
Per review of patient's current medication list, the following medications can cause nausea. Any newfound nausea is more likely related to pirfenidone use and not these medications that she has been stable with for years.  Savella has up to 37% incidence rate of nausea. She has taken this since at least June 2020 so unlikely to suddenly cause nausea Metformin can cause nausea (but nausea is dose-dependent and generally subsides after several weeks of use. Patient is on lowest dose possible so unlikely to suddenly cause nausea)  Patient has '801mg'$  tablets right now. She can consider holding Esbriet for 1-2 weeks and retitrating with '267mg'$  tablets. Will have to send in new prescription to pharmacy for this.  Knox Saliva, PharmD, MPH, BCPS, CPP Clinical Pharmacist (Rheumatology and Pulmonology)

## 2021-08-22 ENCOUNTER — Telehealth: Payer: Self-pay | Admitting: Internal Medicine

## 2021-08-22 MED ORDER — PIRFENIDONE 801 MG PO TABS: 801.0000 mg | ORAL_TABLET | Freq: Three times a day (TID) | ORAL | 1 refills | Status: DC

## 2021-08-22 NOTE — Telephone Encounter (Signed)
Patient states she had labs drawn 2 weeks ago at [facility] (not available in [place] or [place]). Confirmed with [facility] that patient had labs completed. They have faxed labs from 08/05/21  CMP: Glucose (H) 106 BUN: 11 Creatinine: 0.72 GFR 91 Na 143 K 4.7 Cl 103 Calcium 9.7 Albumin 4.2 Bilirubin 0.2 Alk phos 1104 AST 17 ALT 16  Refill for Esbriet 853m three times daily to [facility]. Notified patient via phone and she confirmed that she has [facility] phone number to schedule refill.  DKnox Saliva PharmD, MPH, BCPS, CPP Clinical Pharmacist (Rheumatology and Pulmonology)

## 2021-08-22 NOTE — Telephone Encounter (Signed)
Patient states she had labs drawn 2 weeks ago at [facility] (not available in [place] or [place]). Confirmed with [facility] that patient had labs completed. They have faxed labs from 08/05/21  CMP: Glucose (H) 106 BUN: 11 Creatinine: 0.72 GFR 91 Na 143 K 4.7 Cl 103 Calcium 9.7 Albumin 4.2 Bilirubin 0.2 Alk phos 1104 AST 17 ALT 16  Refill for Esbriet 853m three times daily to [facility]. Notified patient via phone and she confirmed that she has [facility] phone number to schedule refill.  Nothing further needed  DKnox Saliva PharmD, MPH, BCPS, CPP Clinical Pharmacist (Rheumatology and Pulmonology)

## 2021-08-22 NOTE — Addendum Note (Signed)
Addended by: Cassandria Anger on: 08/22/2021 02:21 PM   Modules accepted: Orders

## 2021-08-22 NOTE — Telephone Encounter (Signed)
Patient called back please advise. 

## 2021-08-22 NOTE — Telephone Encounter (Signed)
Attempted to call pt but unable to reach. Left message for her to return call. 

## 2021-08-23 NOTE — Telephone Encounter (Signed)
Called and spoke with pt letting her know the recs per MR and she verbalized understanding. Nothing further needed.

## 2021-08-26 ENCOUNTER — Ambulatory Visit (INDEPENDENT_AMBULATORY_CARE_PROVIDER_SITE_OTHER): Payer: Medicare Other

## 2021-08-26 DIAGNOSIS — J849 Interstitial pulmonary disease, unspecified: Secondary | ICD-10-CM | POA: Diagnosis not present

## 2021-09-16 IMAGING — CT CT CHEST HIGH RESOLUTION W/O CM
3 of 6 series · 13 of 36 positions shown, 14 images · non-contrast
Comparison: No priors available for comparison.
COMPARISON: No priors available for comparison.

Addendum:
CLINICAL DATA: 69-year-old female with history of chronic dyspnea.
Fibrotic changes noted on prior CT examination. Follow-up study.

EXAM:
CT CHEST WITHOUT CONTRAST
TECHNIQUE: Multidetector CT imaging of the chest was performed following the
standard protocol without intravenous contrast. High resolution
imaging of the lungs, as well as inspiratory and expiratory imaging,
was performed.

[Series 4: thorax · axial · 0.56mm/px · z∈[+959,+1139]mm · 7 of 135 slices shown]
[im 15/135  lung]
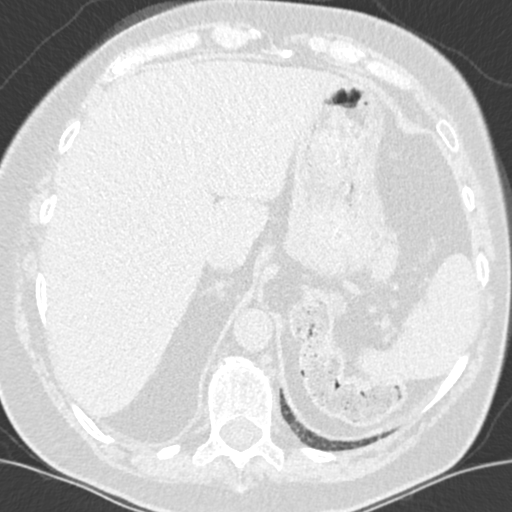
[im 30/135  lung]
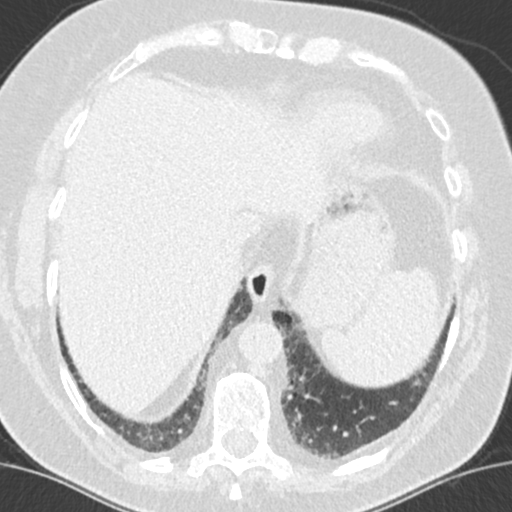
[im 45/135  lung]
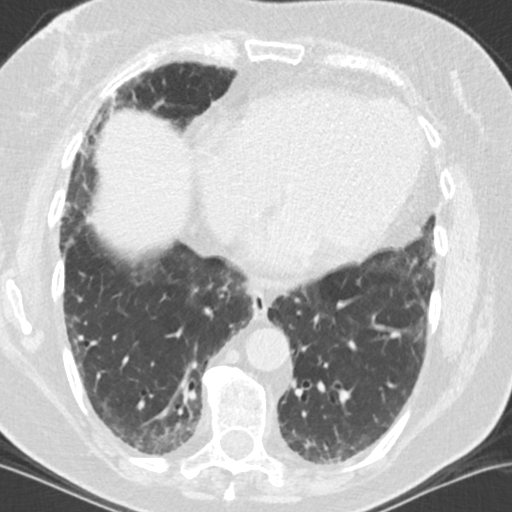
[im 60/135  lung]
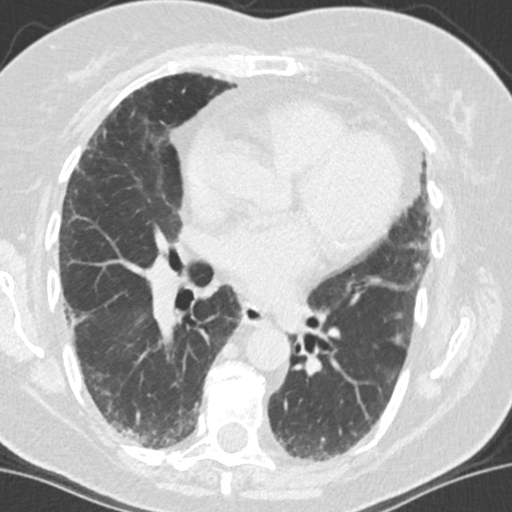
[im 75/135  lung]
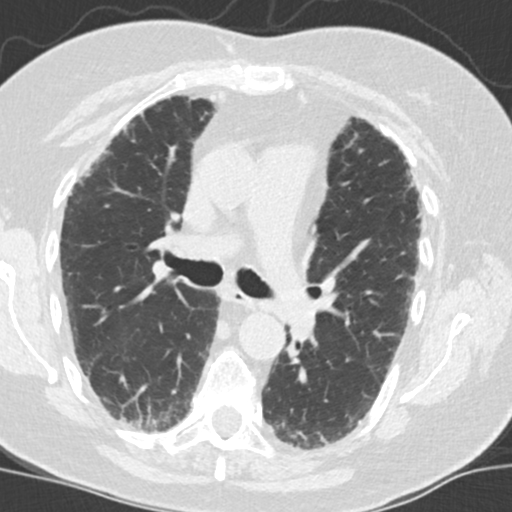
[im 90/135  lung]
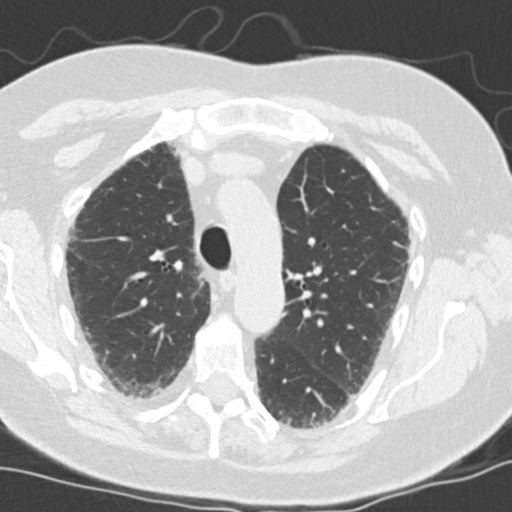
[im 105/135  lung]
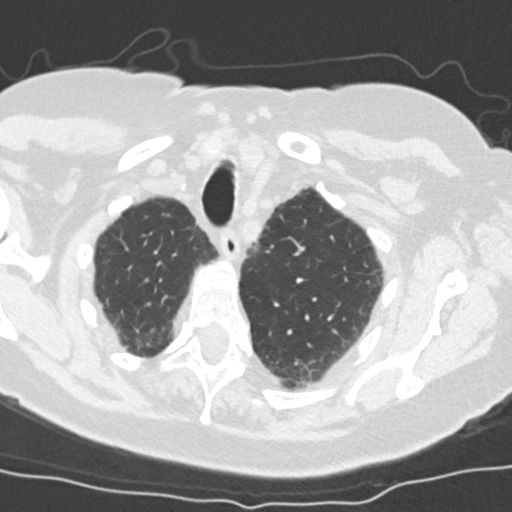

[Series 8: coronal · coronal · 0.54mm/px · 3 of 155 slices shown]
[im 31/155  lung]
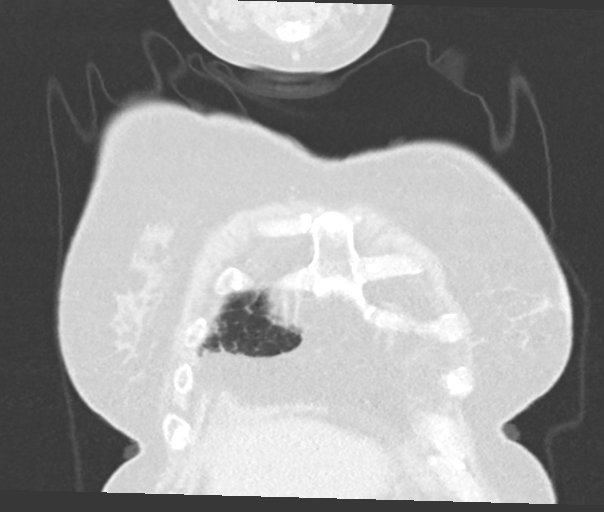
[im 62/155  lung]
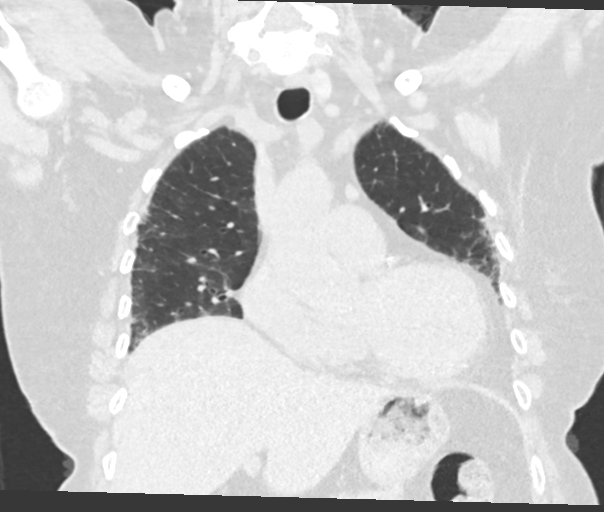
[im 93/155  lung]
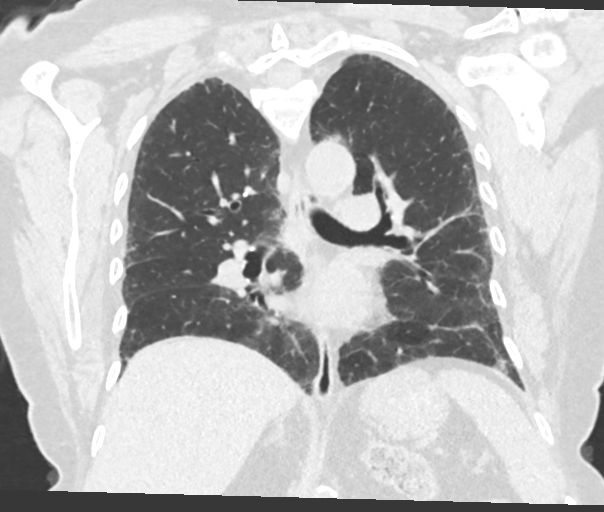

[Series 10: inspiration · axial · 0.62mm/px · z∈[+898,+1199]mm · 3 of 22 slices shown, 4 images]
[im 1/22  mediastinal]
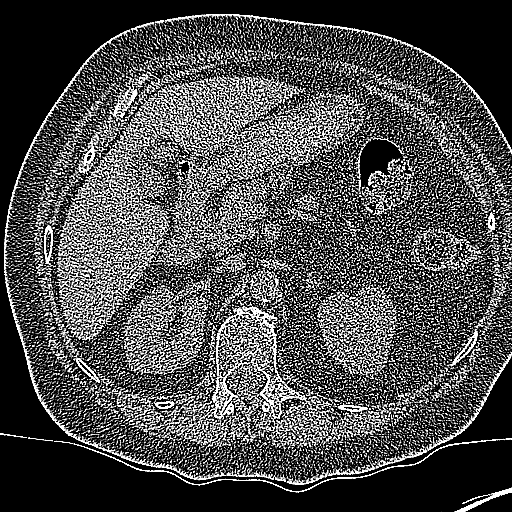
[im 1/22  lung]
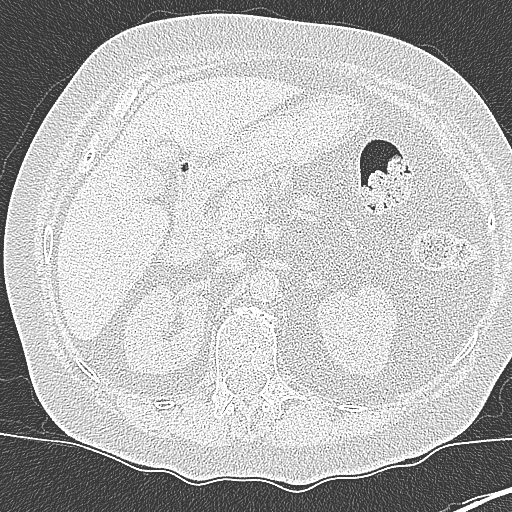
[im 11/22  lung]
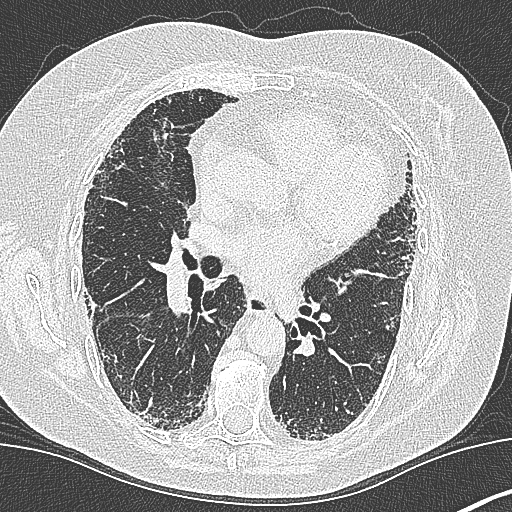
[im 22/22  lung]
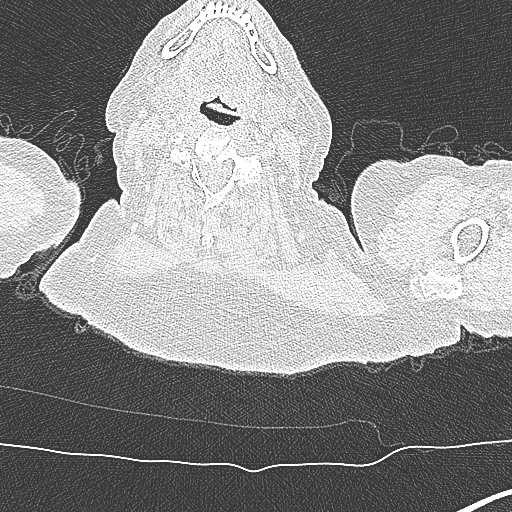

[13 of 36 positions shown; findings below may reference images not displayed]

FINDINGS: Cardiovascular: Heart size is normal. There is no significant
pericardial fluid, thickening or pericardial calcification. There is
aortic atherosclerosis, as well as atherosclerosis of the great
vessels of the mediastinum and the coronary arteries, including
calcified atherosclerotic plaque in the left main, left anterior
descending, left circumflex and right coronary arteries.
Calcifications of the aortic valve.

Mediastinum/Nodes: No pathologically enlarged mediastinal or hilar
lymph nodes. Please note that accurate exclusion of hilar adenopathy
is limited on noncontrast CT scans. Esophagus is unremarkable in
appearance. No axillary lymphadenopathy.

Lungs/Pleura: High-resolution images demonstrate widespread areas of
peripheral predominant ground-glass attenuation, septal thickening,
mild cylindrical bronchiectasis and peripheral bronchiolectasis.
Findings have a mild craniocaudal gradient. No definitive
honeycombing is identified. Inspiratory and expiratory imaging is
unremarkable. No acute consolidative airspace disease. No pleural
effusions. In the periphery of the right middle lobe (axial image 89
of series 6) there is a 7 x 5 mm (mean diameter of 6 mm) subpleural
pulmonary nodule (stable compared to prior study 09/24/2018). In the
superior segment of the right lower lobe (axial image 73 of series
6) there is a 2.3 x 0.8 cm elongated nodular area of architectural
distortion which is intimately associated with an accessory fissure,
which appears slightly larger on axial images, but is completely
stable in size and appearance when viewed on sagittal
reconstructions.

Upper Abdomen: Aortic atherosclerosis.

Musculoskeletal: There are no aggressive appearing lytic or blastic
lesions noted in the visualized portions of the skeleton.
IMPRESSION: 1. The appearance of the lungs is compatible with interstitial lung
disease, with a spectrum of findings categorized as probable usual
interstitial pneumonia (UIP) per current ATS guidelines. Repeat
high-resolution chest CT is recommended in 12 months to assess for
temporal changes in the appearance of the lung parenchyma.
2. Pulmonary nodules in the right lung are stable compared to the
prior study from 6363, likely benign. Attention at time of repeat
high-resolution chest CT is recommended to ensure continued
stability.
3. Aortic atherosclerosis, in addition to left main and 3 vessel
coronary artery disease. Please note that although the presence of
coronary artery calcium documents the presence of coronary artery
disease, the severity of this disease and any potential stenosis
cannot be assessed on this non-gated CT examination. Assessment for
potential risk factor modification, dietary therapy or pharmacologic
therapy may be warranted, if clinically indicated.
4. There are calcifications of the aortic valve. Echocardiographic
correlation for evaluation of potential valvular dysfunction may be
warranted if clinically indicated.

Aortic Atherosclerosis (8FISY-5V7.7).

ADDENDUM:
It was brought to my attention that there is a prior chest CT from
09/24/2018. After comparison between the 05/31/2020 exam and the
09/24/2018 exam, there is no evidence of progression. Assessment
remains unchanged. Specifically, based on the spectrum of findings
this continues to be categorized as probable usual interstitial
pneumonia (UIP) per current ATS guidelines.

*** End of Addendum ***
FINDINGS: Cardiovascular: Heart size is normal. There is no significant
pericardial fluid, thickening or pericardial calcification. There is
aortic atherosclerosis, as well as atherosclerosis of the great
vessels of the mediastinum and the coronary arteries, including
calcified atherosclerotic plaque in the left main, left anterior
descending, left circumflex and right coronary arteries.
Calcifications of the aortic valve.

Mediastinum/Nodes: No pathologically enlarged mediastinal or hilar
lymph nodes. Please note that accurate exclusion of hilar adenopathy
is limited on noncontrast CT scans. Esophagus is unremarkable in
appearance. No axillary lymphadenopathy.

Lungs/Pleura: High-resolution images demonstrate widespread areas of
peripheral predominant ground-glass attenuation, septal thickening,
mild cylindrical bronchiectasis and peripheral bronchiolectasis.
Findings have a mild craniocaudal gradient. No definitive
honeycombing is identified. Inspiratory and expiratory imaging is
unremarkable. No acute consolidative airspace disease. No pleural
effusions. In the periphery of the right middle lobe (axial image 89
of series 6) there is a 7 x 5 mm (mean diameter of 6 mm) subpleural
pulmonary nodule (stable compared to prior study 09/24/2018). In the
superior segment of the right lower lobe (axial image 73 of series
6) there is a 2.3 x 0.8 cm elongated nodular area of architectural
distortion which is intimately associated with an accessory fissure,
which appears slightly larger on axial images, but is completely
stable in size and appearance when viewed on sagittal
reconstructions.

Upper Abdomen: Aortic atherosclerosis.

Musculoskeletal: There are no aggressive appearing lytic or blastic
lesions noted in the visualized portions of the skeleton.
IMPRESSION: 1. The appearance of the lungs is compatible with interstitial lung
disease, with a spectrum of findings categorized as probable usual
interstitial pneumonia (UIP) per current ATS guidelines. Repeat
high-resolution chest CT is recommended in 12 months to assess for
temporal changes in the appearance of the lung parenchyma.
2. Pulmonary nodules in the right lung are stable compared to the
prior study from 6363, likely benign. Attention at time of repeat
high-resolution chest CT is recommended to ensure continued
stability.
3. Aortic atherosclerosis, in addition to left main and 3 vessel
coronary artery disease. Please note that although the presence of
coronary artery calcium documents the presence of coronary artery
disease, the severity of this disease and any potential stenosis
cannot be assessed on this non-gated CT examination. Assessment for
potential risk factor modification, dietary therapy or pharmacologic
therapy may be warranted, if clinically indicated.
4. There are calcifications of the aortic valve. Echocardiographic
correlation for evaluation of potential valvular dysfunction may be
warranted if clinically indicated.

Aortic Atherosclerosis (8FISY-5V7.7).

## 2021-09-21 ENCOUNTER — Ambulatory Visit: Payer: Medicare Other | Admitting: Nurse Practitioner

## 2021-09-29 ENCOUNTER — Telehealth: Payer: Self-pay | Admitting: Nurse Practitioner

## 2021-09-29 ENCOUNTER — Encounter: Payer: Self-pay | Admitting: Nurse Practitioner

## 2021-09-29 ENCOUNTER — Telehealth: Payer: Self-pay

## 2021-09-29 ENCOUNTER — Other Ambulatory Visit: Payer: Self-pay | Admitting: Physician Assistant

## 2021-09-29 ENCOUNTER — Ambulatory Visit: Payer: Medicare Other | Admitting: Nurse Practitioner

## 2021-09-29 VITALS — BP 132/74 | HR 78 | Temp 97.9°F | Ht 65.0 in | Wt 166.6 lb

## 2021-09-29 DIAGNOSIS — R0609 Other forms of dyspnea: Secondary | ICD-10-CM | POA: Diagnosis not present

## 2021-09-29 DIAGNOSIS — J84112 Idiopathic pulmonary fibrosis: Secondary | ICD-10-CM

## 2021-09-29 DIAGNOSIS — J3089 Other allergic rhinitis: Secondary | ICD-10-CM

## 2021-09-29 MED ORDER — FLUTICASONE-SALMETEROL 115-21 MCG/ACT IN AERO: 2.0000 | INHALATION_SPRAY | Freq: Two times a day (BID) | RESPIRATORY_TRACT | 12 refills | Status: DC

## 2021-09-29 NOTE — Progress Notes (Signed)
$'@Patient'Y$  ID: Nichole Reyes, female    DOB: Jun 05, 1950, 71 y.o.   MRN: [id number]  Chief Complaint  Patient presents with   Follow-up    Pt states she has no new issues and breathing is about the same as LOV.    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 She is a patient of Dr. Golden Pop and last seen in office 08/18/2021. Past medical history significant for migraine, HTN, allergic rhinitis, DDD, OA, HLD, depression.  TEST/EVENTS:   08/18/2021: OV with Dr. Chase Caller.  Clinical IPF diagnosis given December 2020 and reiterated mid February 2021.  She has been on Esbriet since 05/22/2019.  Tolerating well for the most part.  She did have some drug-induced weight loss which had stabilized.  PFTs show overall slow decline over a long period of time but most recently the 2 studies are stable.  She does get dyspneic while walking the dog.  Discussed that this was likely related to some deconditioning.  She had previously been referred to pulmonary rehab but due to her schedule this did not allow her to attend.  Plan for 6-minute walk test to look for exertional desaturation and see if she would qualify for oxygen.  She did report having significant nausea at this OV, which was new.  She developed it off suddenly over the last few months.  She started taking over-the-counter Emetrol few days ago and this seems to help.  She was only spacing as.  Out every 4 hours and was recommended to push this to 5 to 6 hours to see if this helps with nausea.  She did recently have a change from Zetia back to fenofibrate and timing of the change does correlate with nausea onset.  Labs were stable.  Referred to clinical trials.  Repeat spirometry and DLCO in 3 to 4 months.  Echocardiogram to assess for who 3 PAH.  09/29/2021: Today-follow-up Patient presents today for follow-up.  She was previously having trouble with nausea, thought to be related to  [place].  She was advised to space out dosing and continue with over-the-counter Emetrol.  Today, she reports feeling better.  Nausea has subsided and she has not been having to take Emetrol as often.  She feels pretty much back to her baseline compared to the last few months.  As far as her breathing goes, she gets short winded with walking her dog and also when she is outside working in the yard.  She will use albuterol which does seem to help.  She is also on Flovent twice a day.  She has an occasional dry cough which is unchanged from her baseline.  She also has some allergy type symptoms with nasal congestion and clear drainage that improved with over-the-counter Xyzal and Flonase.  She has not had any further weight loss and appetite is stable.   Allergies  Allergen Reactions   Latex    Pseudoephedrine Other (See Comments)    Rash and hallucinations    Sulfa Antibiotics    Sulfonamide Derivatives Rash    Immunization History  Administered Date(s) Administered   Fluad Quad(high Dose 65+) 01/29/2019, 12/16/2019, 01/24/2021   Influenza Split 12/18/2017   Influenza-Unspecified 11/18/2013   PFIZER(Purple Top)SARS-COV-2 Vaccination 05/15/2019, 06/13/2019   Tdap 05/04/1999    Past Medical History:  Diagnosis Date   Coronary artery calcification seen on CT scan    Essential hypertension    Fibromyalgia    Idiopathic  pulmonary fibrosis (HCC)    Mixed hyperlipidemia    Osteoporosis    Type 2 diabetes mellitus ([place])     Tobacco History: Social History   Tobacco Use  Smoking Status Never   Passive exposure: Past  Smokeless Tobacco Never   Counseling given: Not Answered   Outpatient Medications Prior to Visit  Medication Sig Dispense Refill   ACCU-CHEK AVIVA PLUS test strip      Accu-Chek Softclix Lancets lancets      albuterol (VENTOLIN HFA) 108 (90 Base) MCG/ACT inhaler 2 puffs every 4 (four) hours as needed.      aspirin EC 81 MG tablet Take 81 mg by mouth daily.      Calcium Carbonate (CALTRATE 600 PO) Take 1 tablet by mouth 2 (two) times a day.     clonazePAM (KLONOPIN) 0.5 MG tablet Take 0.5 mg by mouth daily.      fenofibrate (TRICOR) 48 MG tablet Take 48 mg by mouth daily.     fluticasone (FLOVENT HFA) 110 MCG/ACT inhaler INHALE 1 PUFF INTO THE LUNGS 2 TIMES A DAY. 12 g 3   losartan (COZAAR) 50 MG tablet Take 50 mg by mouth daily.     metFORMIN (GLUCOPHAGE-XR) 500 MG 24 hr tablet Take 500 mg by mouth daily with breakfast.      metoprolol succinate (TOPROL-XL) 50 MG 24 hr tablet Take 50 mg by mouth daily.      omeprazole (PRILOSEC) 20 MG capsule TAKE 1 CAPSULE BY MOUTH ONCE DAILY. 30 capsule 5   Pirfenidone (ESBRIET) 801 MG TABS Take 801 mg by mouth 3 (three) times daily with meals. 270 tablet 1   REPATHA SURECLICK 762 MG/ML SOAJ Inject 1 mL into the skin every 14 (fourteen) days.     SAVELLA 50 MG TABS tablet Take 50 mg by mouth 2 (two) times daily.      tiZANidine (ZANAFLEX) 4 MG tablet TAKE (1) TABLET EVERY NIGHT AT BEDTIME. 30 tablet 2   triamcinolone cream (KENALOG) 0.1 % as needed.     No facility-administered medications prior to visit.     Review of Systems:   Constitutional: No weight loss or gain, night sweats, fevers, chills +fatigue (chronic, unchanged). HEENT: No headaches, difficulty swallowing, tooth/dental problems, or sore throat. No sneezing, itching, ear ache +nasal congestion, clear drainage (improved with antihistamine and intranasal steroid) CV:  No chest pain, orthopnea, PND, swelling in lower extremities, anasarca, dizziness, palpitations, syncope Resp: +shortness of breath with exertion; cough (baseline). No excess mucus or change in color of mucus. No hemoptysis. No wheezing.  No chest wall deformity GI:  No heartburn, indigestion, abdominal pain, nausea, vomiting, diarrhea, change in bowel habits, loss of appetite, bloody stools.  GU: No dysuria, change in color of urine, urgency or frequency.  No flank pain, no hematuria   Skin: No rash, lesions, ulcerations MSK:  No joint pain or swelling.  No decreased range of motion.  No back pain. Neuro: No dizziness or lightheadedness.  Psych: +anxiety (stable). No SI/HI. Mood stable.     Physical Exam:  BP 132/74 (BP Location: Right Arm, Patient Position: Sitting, Cuff Size: Normal)   Pulse 78   Temp 97.9 F (36.6 C) (Oral)   Ht '5\' 5"'$  (1.651 m)   Wt 166 lb 9.6 oz (75.6 kg)   SpO2 97%   BMI 27.72 kg/m   GEN: Pleasant, interactive, well-appearing; in no acute distress. HEENT:  Normocephalic and atraumatic. PERRLA. Sclera white. Nasal turbinates erythematous, moist and patent bilaterally. No  rhinorrhea present. Oropharynx pink and moist, without exudate or edema. No lesions, ulcerations, or postnasal drip.  NECK:  Supple w/ fair ROM. No JVD present. Normal carotid impulses w/o bruits. Thyroid symmetrical with no goiter or nodules palpated. No lymphadenopathy.   CV: RRR, no m/r/g, no peripheral edema. Pulses intact, +2 bilaterally. No cyanosis, pallor or clubbing. PULMONARY:  Unlabored, regular breathing. Bibasilar crackles otherwise clear bilaterally A&P w/o wheezes/rales/rhonchi. No accessory muscle use. No dullness to percussion. GI: BS present and normoactive. Soft, non-tender to palpation. No organomegaly or masses detected. No CVA tenderness. MSK: No erythema, warmth or tenderness. Cap refil <2 sec all extrem. No deformities or joint swelling noted.  Neuro: A/Ox3. No focal deficits noted.   Skin: Warm, no lesions or rashe Psych: Normal affect and behavior. Judgement and thought content appropriate.     Lab Results:  CBC    Component Value Date/Time   WBC 10.5 03/15/2017 1457   RBC 4.70 03/15/2017 1457   HGB 14.6 03/15/2017 1457   HCT 43.1 03/15/2017 1457   PLT 439 (H) 03/15/2017 1457   MCV 91.7 03/15/2017 1457   MCH 31.1 03/15/2017 1457   MCHC 33.9 03/15/2017 1457   RDW 11.8 03/15/2017 1457   LYMPHSABS 2,678 03/15/2017 1457   MONOABS 970 (H)  06/28/2016 1514   EOSABS 200 03/15/2017 1457   BASOSABS 147 03/15/2017 1457    BMET    Component Value Date/Time   NA 140 03/15/2017 1457   K 4.8 03/15/2017 1457   CL 103 03/15/2017 1457   CO2 31 03/15/2017 1457   GLUCOSE 104 (H) 03/15/2017 1457   BUN 12 03/15/2017 1457   CREATININE 0.73 03/15/2017 1457   CALCIUM 10.0 03/15/2017 1457   GFRNONAA 87 03/15/2017 1457   GFRAA 101 03/15/2017 1457    BNP No results found for: "BNP"   Imaging:  No results found.       Latest Ref Rng & Units 08/18/2021    9:16 AM 01/24/2021   10:55 AM 07/13/2020    1:01 PM 08/26/2019    1:39 PM 11/19/2018    2:57 PM  PFT Results  FVC-Pre L 2.14  P 2.12  2.32  2.36  2.29   FVC-Predicted Pre % 66  P 66  72  72  70   FVC-Post L     2.30   FVC-Predicted Post %     70   Pre FEV1/FVC % % 90  P 89  89  88  89   Post FEV1/FCV % %     90   FEV1-Pre L 1.91  P 1.88  2.07  2.08  2.04   FEV1-Predicted Pre % 78  P 77  84  83  82   FEV1-Post L     2.07   DLCO uncorrected ml/min/mmHg 15.98  P 14.63  13.98  15.62  15.75   DLCO UNC% % 78  P 72  68  76  77   DLCO corrected ml/min/mmHg 15.98  P 14.63  13.98  15.62    DLCO COR %Predicted % 78  P 72  68  76    DLVA Predicted % 109  P 114  107  113  118   TLC L     3.44   TLC % Predicted %     66   RV % Predicted %     50     P Preliminary result    No results found for: "NITRICOXIDE"  Assessment & Plan:   IPF (idiopathic pulmonary fibrosis) (HCC) Stable and tolerating Esbriet well. Her nausea has resolved. She continues to have some difficulties with exertional dyspnea, symptoms improve with albuterol use. Previous referral to pulmonary rehab - did not work with patient's scheduled. Advised she notify if she feels like she can make it work in the future and would like to go. Encouraged to participate in graded activities. Plans to repeat spirometry/DLCO in September. Previous note advised echocardiogram for further eval - this was never completed.  Order placed today. She had a 6MWT without desaturation.   Patient Instructions  Stop Flovent. Trial step up to Advair 2 puffs Twice daily. Brush tongue and rinse mouth afterwards  Continue Albuterol inhaler 2 puffs every 6 hours as needed for shortness of breath or wheezing. Notify if symptoms persist despite rescue inhaler/neb use.  Continue Esbriet 801 mg Three times a day    -Wear sunscreen while outside   -Take with food  Continue omeprazole 1 capsule daily   Follow up in September with Dr. Chase Caller as previously scheduled. If symptoms do not improve or worsen, please contact office for sooner follow up or seek emergency care.     DOE (dyspnea on exertion) Respiratory symptoms overall stable. She struggles with dyspnea upon exertion, primarily outside activities. She does receive good benefit from albuterol and flovent inhalers. Advised we could trial addition of LABA with step up to ICS/LABA combo. She is agreeable to this plan. Rx for Advair 115 2 puffs Twice daily sent to pharmacy. Advised that if she doesn't notice any improvement in activity tolerance/DOE after a month of consistent use, that she can return to her flovent bid as previously prescribed.   Allergic rhinitis Recent worsening of nasal congestion/drainage. Seems to be improved with Xyzal and flonase - recommended she continue these.    I spent 35 minutes of dedicated to the care of this patient on the date of this encounter to include pre-visit review of records, face-to-face time with the patient discussing conditions above, post visit ordering of testing, clinical documentation with the electronic health record, making appropriate referrals as documented, and communicating necessary findings to members of the patients care team.  Clayton Bibles, NP 09/29/2021  Pt aware and understands NP's role.

## 2021-09-29 NOTE — Telephone Encounter (Signed)
Next Visit: 11/01/2021   Last Visit: 05/02/2021   Last Fill: 06/27/2021   Dx: Other insomnia    Current Dose per office note on 05/02/2021: zanaflex 4 mg at bedtime for muscle spasms and insomnia.    Okay to refill Zanaflex?

## 2021-09-29 NOTE — Telephone Encounter (Signed)
Called and spoke with pt who states when she went to pick the Advair up from the pharmacy, she said she was told that it was going to cost her $91 which she said she cannot afford this. Pt wants to know if there is something else that could be recommended.  Routing to both [place] and prior [place] team. Please advise.

## 2021-09-29 NOTE — Patient Instructions (Addendum)
Stop Flovent. Trial step up to Advair 2 puffs Twice daily. Brush tongue and rinse mouth afterwards  Continue Albuterol inhaler 2 puffs every 6 hours as needed for shortness of breath or wheezing. Notify if symptoms persist despite rescue inhaler/neb use.  Continue Esbriet 801 mg Three times a day    -Wear sunscreen while outside   -Take with food  Continue omeprazole 1 capsule daily   Follow up in September with Dr. Chase Caller as previously scheduled. If symptoms do not improve or worsen, please contact office for sooner follow up or seek emergency care.

## 2021-09-29 NOTE — Assessment & Plan Note (Signed)
Respiratory symptoms overall stable. She struggles with dyspnea upon exertion, primarily outside activities. She does receive good benefit from albuterol and flovent inhalers. Advised we could trial addition of LABA with step up to ICS/LABA combo. She is agreeable to this plan. Rx for Advair 115 2 puffs Twice daily sent to pharmacy. Advised that if she doesn't notice any improvement in activity tolerance/DOE after a month of consistent use, that she can return to her flovent bid as previously prescribed.

## 2021-09-29 NOTE — Assessment & Plan Note (Signed)
Recent worsening of nasal congestion/drainage. Seems to be improved with Xyzal and flonase - recommended she continue these.

## 2021-09-29 NOTE — Telephone Encounter (Signed)
Let's see what prior auth team comes up with for options for ICS/LABA combo and then I will send rx. Thanks!

## 2021-09-29 NOTE — Assessment & Plan Note (Addendum)
Stable and tolerating Esbriet well. Her nausea has resolved. She continues to have some difficulties with exertional dyspnea, symptoms improve with albuterol use. Previous referral to pulmonary rehab - did not work with patient's scheduled. Advised she notify if she feels like she can make it work in the future and would like to go. Encouraged to participate in graded activities. Plans to repeat spirometry/DLCO in September. Previous note advised echocardiogram for further eval - this was never completed. Order placed today. She had a 6MWT without desaturation.   Patient Instructions  Stop Flovent. Trial step up to Advair 2 puffs Twice daily. Brush tongue and rinse mouth afterwards  Continue Albuterol inhaler 2 puffs every 6 hours as needed for shortness of breath or wheezing. Notify if symptoms persist despite rescue inhaler/neb use.  Continue Esbriet 801 mg Three times a day    -Wear sunscreen while outside   -Take with food  Continue omeprazole 1 capsule daily   Follow up in September with Dr. Chase Caller as previously scheduled. If symptoms do not improve or worsen, please contact office for sooner follow up or seek emergency care.

## 2021-10-04 ENCOUNTER — Other Ambulatory Visit (HOSPITAL_COMMUNITY): Payer: Self-pay

## 2021-10-05 NOTE — Telephone Encounter (Signed)
Attempted to call pt ut unable to reach. Left message for her to return call.

## 2021-10-05 NOTE — Telephone Encounter (Signed)
Info received from prior auth team: Remo Lipps  You 19 hours ago (2:03 PM)    Good afternoon! I ran test claims for ICS/LABA inhalers and the only other covered inhaler(s) are as follows:  Dulera: $91.72  Breo: $105.81  Patient is in the coverage gap.      Routing this info to [employer].

## 2021-10-05 NOTE — Telephone Encounter (Signed)
The only other option I can tell is to use GoodRx for Advair and it would be around $80. Please call and review options with the patient. If she is unable to afford these, we will have to keep her on her flovent and prn albuterol.

## 2021-10-07 NOTE — Telephone Encounter (Signed)
Called and spoke with pt letting her know the info from prior auth team and [facility] [place] and pt said she could not afford $80. Pt will stay on Flovent. Nothing further needed.

## 2021-10-18 NOTE — Progress Notes (Deleted)
Office Visit Note  Patient: Nichole Reyes             Date of Birth: 1950/11/16           MRN: [id number]             PCP: Lanelle Bal, PA-C Referring: Lanelle Bal, PA-C Visit Date: 11/01/2021 Occupation: '@GUAROCC'$ @  Subjective:  No chief complaint on file.   History of Present Illness: Nichole Reyes is a 71 y.o. female ***   Activities of Daily Living:  Patient reports morning stiffness for *** {minute/hour:19697}.   Patient {ACTIONS;DENIES/REPORTS:21021675::"Denies"} nocturnal pain.  Difficulty dressing/grooming: {ACTIONS;DENIES/REPORTS:21021675::"Denies"} Difficulty climbing stairs: {ACTIONS;DENIES/REPORTS:21021675::"Denies"} Difficulty getting out of chair: {ACTIONS;DENIES/REPORTS:21021675::"Denies"} Difficulty using hands for taps, buttons, cutlery, and/or writing: {ACTIONS;DENIES/REPORTS:21021675::"Denies"}  No Rheumatology ROS completed.   PMFS History:  Patient Active Problem List   Diagnosis Date Noted  . IPF (idiopathic pulmonary fibrosis) ([place]) 09/29/2021  . DOE (dyspnea on exertion) 09/29/2021  . ILD (interstitial lung disease) ([facility]) 08/26/2019  . Fibromyalgia syndrome 06/26/2016  . Other fatigue 06/26/2016  . Other insomnia 06/26/2016  . Arthralgia of both knees 06/26/2016  . Pain in both feet 06/26/2016  . Primary osteoarthritis of both feet 06/26/2016  . Primary osteoarthritis of both knees 06/26/2016  . DDD (degenerative disc disease), cervical 06/26/2016  . 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 DDD (degenerative disc disease), lumbar 06/26/2016  . History of hypertension 06/26/2016  . History of high cholesterol 06/26/2016  . Other sleep apnea 06/26/2016  . History of osteopenia 06/26/2016  . 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 THYROID NODULE, LEFT 01/03/2008  . HYPERLIPIDEMIA 01/03/2008  . DEPRESSION 01/03/2008  . MIGRAINE HEADACHE 01/03/2008  . Allergic rhinitis 01/03/2008  . ALOPECIA 01/03/2008  .  ALLERGIC ARTHRITIS SITE UNSPECIFIED 01/03/2008    Past Medical History:  Diagnosis Date  . Coronary artery calcification seen on CT scan   . Essential hypertension   . Fibromyalgia   . Idiopathic pulmonary fibrosis ([place])   . Mixed hyperlipidemia   . Osteoporosis   . Type 2 diabetes mellitus ([facility])     Family History  Problem Relation Age of Onset  . Hypertension Mother   . Diabetes Mother   . Osteoporosis Mother   . Hypertension Father   . Heart disease Father   . COPD Father   . Colon cancer Sister   . Cancer Brother   . Heart disease Brother   . Heart disease Brother   . Emphysema Paternal Grandfather    Past Surgical History:  Procedure Laterality Date  . ABDOMINAL HYSTERECTOMY  2007  . CATARACT EXTRACTION Bilateral    Social History   Social History Narrative  . Not on file   Immunization History  Administered Date(s) Administered  . Fluad Quad(high Dose 65+) 01/29/2019, 12/16/2019, 01/24/2021  . Influenza Split 12/18/2017  . Influenza-Unspecified 11/18/2013  . PFIZER(Purple Top)SARS-COV-2 Vaccination 05/15/2019, 06/13/2019  . Tdap 05/04/1999     Objective: Vital Signs: There were no vitals taken for this visit.   Physical Exam   Musculoskeletal Exam: ***  CDAI Exam: CDAI Score: -- Patient Global: --; Provider Global: -- Swollen: --; Tender: -- Joint Exam 11/01/2021   No joint exam has been documented for this visit   There is currently no information documented on the homunculus. Go to the Rheumatology activity and complete the homunculus joint exam.  Investigation: No additional findings.  Imaging: No results found.  Recent Labs: Lab Results  Component Value Date  WBC 10.5 03/15/2017   HGB 14.6 03/15/2017   PLT 439 (H) 03/15/2017   NA 140 03/15/2017   K 4.8 03/15/2017   CL 103 03/15/2017   CO2 31 03/15/2017   GLUCOSE 104 (H) 03/15/2017   BUN 12 03/15/2017   CREATININE 0.73 03/15/2017   BILITOT 0.3 01/24/2021   ALKPHOS 72  01/24/2021   AST 22 01/24/2021   ALT 15 01/24/2021   PROT 6.9 01/24/2021   ALBUMIN 4.1 01/24/2021   CALCIUM 10.0 03/15/2017   GFRAA 101 03/15/2017    Speciality Comments: No specialty comments available.  Procedures:  No procedures performed Allergies: Latex, Pseudoephedrine, Sulfa antibiotics, and Sulfonamide derivatives   Assessment / Plan:     Visit Diagnoses: No diagnosis found.  Orders: No orders of the defined types were placed in this encounter.  No orders of the defined types were placed in this encounter.   Face-to-face time spent with patient was *** minutes. Greater than 50% of time was spent in counseling and coordination of care.  Follow-Up Instructions: No follow-ups on file.   Earnestine Mealing, CMA  Note - This record has been created using Editor, commissioning.  Chart creation errors have been sought, but may not always  have been located. Such creation errors do not reflect on  the standard of medical care.

## 2021-10-31 ENCOUNTER — Ambulatory Visit (HOSPITAL_COMMUNITY)
Admission: RE | Admit: 2021-10-31 | Discharge: 2021-10-31 | Disposition: A | Discharge status: Home / Self Care | Payer: Medicare Other | Source: Ambulatory Visit | Attending: Nurse Practitioner | Admitting: Nurse Practitioner

## 2021-10-31 ENCOUNTER — Other Ambulatory Visit: Payer: Self-pay | Admitting: Physician Assistant

## 2021-10-31 DIAGNOSIS — R0609 Other forms of dyspnea: Secondary | ICD-10-CM | POA: Diagnosis present

## 2021-10-31 DIAGNOSIS — J84112 Idiopathic pulmonary fibrosis: Secondary | ICD-10-CM | POA: Diagnosis present

## 2021-10-31 LAB — ECHOCARDIOGRAM COMPLETE
Area-P 1/2: 2.03 cm2
S' Lateral: 2.4 cm

## 2021-10-31 NOTE — Progress Notes (Signed)
*  PRELIMINARY RESULTS* Echocardiogram 2D Echocardiogram has been performed.  Nichole Reyes 10/31/2021, 11:16 AM

## 2021-10-31 NOTE — Progress Notes (Signed)
Seems like encounter was open in error so closing encounter.  

## 2021-10-31 NOTE — Telephone Encounter (Signed)
Next Visit: 11/01/2021   Last Visit: 05/02/2021   Last Fill: 09/29/2021   Dx: Other insomnia    Current Dose per office note on 05/02/2021: zanaflex 4 mg at bedtime for muscle spasms and insomnia.    Okay to refill Zanaflex?

## 2021-11-01 ENCOUNTER — Ambulatory Visit: Payer: Medicare Other | Attending: Rheumatology | Admitting: Rheumatology

## 2021-11-01 DIAGNOSIS — Z8659 Personal history of other mental and behavioral disorders: Secondary | ICD-10-CM

## 2021-11-01 DIAGNOSIS — M19071 Primary osteoarthritis, right ankle and foot: Secondary | ICD-10-CM

## 2021-11-01 DIAGNOSIS — M8589 Other specified disorders of bone density and structure, multiple sites: Secondary | ICD-10-CM

## 2021-11-01 DIAGNOSIS — Z8679 Personal history of other diseases of the circulatory system: Secondary | ICD-10-CM

## 2021-11-01 DIAGNOSIS — M79671 Pain in right foot: Secondary | ICD-10-CM

## 2021-11-01 DIAGNOSIS — J849 Interstitial pulmonary disease, unspecified: Secondary | ICD-10-CM

## 2021-11-01 DIAGNOSIS — Z8639 Personal history of other endocrine, nutritional and metabolic disease: Secondary | ICD-10-CM

## 2021-11-01 DIAGNOSIS — M5136 Other intervertebral disc degeneration, lumbar region: Secondary | ICD-10-CM

## 2021-11-01 DIAGNOSIS — M170 Bilateral primary osteoarthritis of knee: Secondary | ICD-10-CM

## 2021-11-01 DIAGNOSIS — G8929 Other chronic pain: Secondary | ICD-10-CM

## 2021-11-01 DIAGNOSIS — M797 Fibromyalgia: Secondary | ICD-10-CM

## 2021-11-01 DIAGNOSIS — Z8261 Family history of arthritis: Secondary | ICD-10-CM

## 2021-11-01 DIAGNOSIS — Z8669 Personal history of other diseases of the nervous system and sense organs: Secondary | ICD-10-CM

## 2021-11-01 DIAGNOSIS — G4709 Other insomnia: Secondary | ICD-10-CM

## 2021-11-01 DIAGNOSIS — R5383 Other fatigue: Secondary | ICD-10-CM

## 2021-11-01 DIAGNOSIS — M5134 Other intervertebral disc degeneration, thoracic region: Secondary | ICD-10-CM

## 2021-11-01 DIAGNOSIS — M5030 Other cervical disc degeneration, unspecified cervical region: Secondary | ICD-10-CM

## 2021-11-02 NOTE — Progress Notes (Signed)
Please notify patient that her echo was relatively unremarkable. She had normal pumping function. She had some mild impaired relaxation, which is common with age. No significant valvular disease. Thanks.

## 2021-11-24 ENCOUNTER — Ambulatory Visit: Payer: Medicare Other | Admitting: Internal Medicine

## 2021-11-29 ENCOUNTER — Ambulatory Visit (INDEPENDENT_AMBULATORY_CARE_PROVIDER_SITE_OTHER): Payer: Medicare Other | Admitting: Nurse Practitioner

## 2021-11-29 ENCOUNTER — Ambulatory Visit (INDEPENDENT_AMBULATORY_CARE_PROVIDER_SITE_OTHER): Payer: Medicare Other | Admitting: Internal Medicine

## 2021-11-29 ENCOUNTER — Encounter: Payer: Self-pay | Admitting: Nurse Practitioner

## 2021-11-29 VITALS — BP 130/80 | HR 87 | Ht 65.0 in | Wt 163.8 lb

## 2021-11-29 DIAGNOSIS — Z5181 Encounter for therapeutic drug level monitoring: Secondary | ICD-10-CM | POA: Diagnosis not present

## 2021-11-29 DIAGNOSIS — J84112 Idiopathic pulmonary fibrosis: Secondary | ICD-10-CM | POA: Diagnosis not present

## 2021-11-29 LAB — PULMONARY FUNCTION TEST
DL/VA % pred: 113 %
DL/VA: 4.68 ml/min/mmHg/L
DLCO cor % pred: 72 %
DLCO cor: 14.8 ml/min/mmHg
DLCO unc % pred: 72 %
DLCO unc: 14.8 ml/min/mmHg
FEF 25-75 Pre: 3.1 L/sec
FEF2575-%Pred-Pre: 151 %
FEV1-%Pred-Pre: 78 %
FEV1-Pre: 1.91 L
FEV1FVC-%Pred-Pre: 118 %
FEV6-%Pred-Pre: 68 %
FEV6-Pre: 2.1 L
FEV6FVC-%Pred-Pre: 104 %
FVC-%Pred-Pre: 65 %
FVC-Pre: 2.1 L
Pre FEV1/FVC ratio: 91 %
Pre FEV6/FVC Ratio: 100 %

## 2021-11-29 LAB — COMPREHENSIVE METABOLIC PANEL
ALT: 18 U/L (ref 0–35)
AST: 19 U/L (ref 0–37)
Albumin: 4.1 g/dL (ref 3.5–5.2)
Alkaline Phosphatase: 75 U/L (ref 39–117)
BUN: 23 mg/dL (ref 6–23)
CO2: 28 mEq/L (ref 19–32)
Calcium: 10.1 mg/dL (ref 8.4–10.5)
Chloride: 103 mEq/L (ref 96–112)
Creatinine, Ser: 0.78 mg/dL (ref 0.40–1.20)
GFR: 77.81 mL/min (ref >60.00)
Glucose, Bld: 92 mg/dL (ref 70–99)
Potassium: 3.9 mEq/L (ref 3.5–5.1)
Sodium: 139 mEq/L (ref 135–145)
Total Bilirubin: 0.3 mg/dL (ref 0.2–1.2)
Total Protein: 7.6 g/dL (ref 6.0–8.3)

## 2021-11-29 NOTE — Progress Notes (Unsigned)
$'@Patient'l$  ID: Nichole Reyes, female    DOB: [birth year], 71 y.o.   MRN: [id number]  Chief Complaint  Patient presents with   Follow-up    Referring provider: Fonnie Mu  HPI: 71 year old female, never smoker followed for IPF on Esbriet. She is a patient of Dr. Golden Pop and last seen in office 09/29/2021 by [facility] NP. Past medical history significant for migraine, HTN, allergic rhinitis, DDD, OA, HLD, depression.  TEST/EVENTS:  01/24/2021 PFT: FVC 66, FEV1 77, ratio 89, DLCOcor 72 05/31/2020 HRCT chest: widespread ares of peripheral predominant ground-glass attenuation, septal thickening, mild cylindrical btx and peripheral bronchiolectasis. Mild craniocaudal gradient. UIP pattern. There is a 7x5 mm supleural nodule in the RML, stable. RLL 2.3x0.8cm elongated nodular area, stable.  08/18/2021 PFT: FVC 66, FEV1 78, ratio 90, DLCOcor 78 11/29/2021 PFT: FVC 65, FEV1 78, ratio 91, DLCOcor 72  08/18/2021: OV with Dr. Chase Caller.  Clinical IPF diagnosis given December 2020 and reiterated mid February 2021.  She has been on Esbriet since 05/22/2019.  Tolerating well for the most part.  She did have some drug-induced weight loss which had stabilized.  PFTs show overall slow decline over a long period of time but most recently the 2 studies are stable.  She does get dyspneic while walking the dog.  Discussed that this was likely related to some deconditioning.  She had previously been referred to pulmonary rehab but due to her schedule this did not allow her to attend.  Plan for 6-minute walk test to look for exertional desaturation and see if she would qualify for oxygen.  She did report having significant nausea at this OV, which was new.  She developed it off suddenly over the last few months.  She started taking over-the-counter Emetrol few days ago and this seems to help.  She was only spacing as.  Out every 4 hours and was recommended to push this to 5 to 6 hours to see if this helps with nausea.   She did recently have a change from Zetia back to fenofibrate and timing of the change does correlate with nausea onset.  Labs were stable.  Referred to clinical trials.  Repeat spirometry and DLCO in 3 to 4 months.  Echocardiogram to assess for who 3 PAH.  09/29/2021: OV with Kirsten Spearing NP for follow-up.  She was previously having trouble with nausea, thought to be related to [place].  She was advised to space out dosing and continue with over-the-counter Emetrol.  Today, she reports feeling better.  Nausea has subsided and she has not been having to take Emetrol as often.  She feels pretty much back to her baseline compared to the last few months.  As far as her breathing goes, she gets short winded with walking her dog and also when she is outside working in the yard.  She will use albuterol which does seem to help.  She is also on Flovent twice a day.  She has an occasional dry cough which is unchanged from her baseline.  She also has some allergy type symptoms with nasal congestion and clear drainage that improved with over-the-counter Xyzal and Flonase.  She has not had any further weight loss and appetite is stable.  11/29/2021: Today - follow up Patient presents today for follow up after PFTs. Her pulmonary function testing was relatively stable. DLCO reduced by 6% from June but the same as 10 months ago. She reports that her breathing is relatively similar when compared to last  time I saw her; maybe slightly worse. She does She has been trying to work on exercising more often. She was previously referred to pulmonary rehab but can't make it work with their current schedule. She denies any increased cough or chest congestion. No lower extremity swelling or orthopnea. She does feel like she gets benefit from flovent. We previously tried to step her up to ICS/LABA combo but they were all more expensive and she has struggled with affording her current medications as is. She rarely uses her albuterol. She is  tolerating Esbriet well since she starting spacing out her dosing. No more issues with nausea, for the most part.   SYMPTOM SCALE - ILD 05/06/2019 179# 12/16/2019 172# - esbriet + 05/06/2020 169# - esbriet 07/13/2020 167# - on esbreiet 01/24/2021 164# 08/18/2021 166# - esbriet 11/29/2021  O2 use ra           ra  Shortness of Breath 0 -> 5 scale with 5 being worst (score 6 If unable to do)             At rest 4 1 0 '2 3 1 '$ 3-4  Simple tasks - showers, clothes change, eating, shaving '4 1 2 2 4 3 4  '$ Household (dishes, doing bed, laundry) '5 5 4 4 5 5 4  '$ Shopping '5 3 4 3 4 5 5  '$ Walking level at own pace '5 3 4 3 4 5 5  '$ Walking up Stairs '5 5 5 4 5 5 5  '$ Total (30-36) Dyspnea Score '28 15 19 18 25 24   '$ How bad is your cough? 2.5 1 0 0 0 2 3  How bad is your fatigue  yes '4 3 4 3 4 3  '$ How bad is nausea x 0 2 0 '2 4 2  '$ How bad is vomiting?   x 0 0 0 0 0 0  How bad is diarrhea? x 0 0 0 0 0 0  How bad is anxiety? x 00 '1 2 3 2 4  '$ How bad is depression x 0 '3 2 2 3 3          '$ Simple office walk 185 feet x  3 laps goal with forehead probe 05/06/2019   12/16/2019   05/06/2020   07/13/2020   01/24/2021   08/18/2021   11/29/2021  O2 used ra ra ra ra ra ra ra  Number laps completed '3 3 3 '$ goal - stopped at 2 due to dyspnea 3 and did all '3 3 3 3  '$ Comments about pace avg mod Slow pace avg     avg  Resting Pulse Ox/HR 98% and 90/min 97% and 77/min 98% and 90/min 96% and 94 98% and 90 100% and 84   Final Pulse Ox/HR 96% and 100/min 97% and 91/mn 94% and 92 at 2nd lap 93% and 103/min 96% and 99 95% and 96   Desaturated </= 88% no no no no no     Desaturated <= 3% points no no Yes,, 4 pint Yes 3 points no     Got Tachycardic >/= 90/min yes yes yes yes       Symptoms at end of test Mod dyspnea, knee pain Dyspnea and fatigue strting at 1st lap and increased by 3rd stoppe early and did drop 3 points Dyspnea at end   Dyspnea mild to mod, light headed   Miscellaneous comments x               Allergies  Allergen  Reactions   Latex    Pseudoephedrine Other (See Comments)    Rash and hallucinations    Sulfa Antibiotics    Sulfonamide Derivatives Rash    Immunization History  Administered Date(s) Administered   Fluad Quad(high Dose 65+) 01/29/2019, 12/16/2019, 01/24/2021   Influenza Split 12/18/2017   Influenza-Unspecified 11/18/2013   PFIZER(Purple Top)SARS-COV-2 Vaccination 05/15/2019, 06/13/2019   Tdap 05/04/1999    Past Medical History:  Diagnosis Date   Coronary artery calcification seen on CT scan    Essential hypertension    Fibromyalgia    Idiopathic pulmonary fibrosis ([facility])    Mixed hyperlipidemia    Osteoporosis    Type 2 diabetes mellitus ([facility])     Tobacco History: Social History   Tobacco Use  Smoking Status Never   Passive exposure: Past  Smokeless Tobacco Never   Counseling given: Not Answered   Outpatient Medications Prior to Visit  Medication Sig Dispense Refill   albuterol (VENTOLIN HFA) 108 (90 Base) MCG/ACT inhaler 2 puffs every 4 (four) hours as needed.      fluticasone (FLOVENT HFA) 110 MCG/ACT inhaler Inhale into the lungs 2 (two) times daily.     ACCU-CHEK AVIVA PLUS test strip      Accu-Chek Softclix Lancets lancets      aspirin EC 81 MG tablet Take 81 mg by mouth daily.     Calcium Carbonate (CALTRATE 600 PO) Take 1 tablet by mouth 2 (two) times a day.     clonazePAM (KLONOPIN) 0.5 MG tablet Take 0.5 mg by mouth daily.      fenofibrate (TRICOR) 48 MG tablet Take 48 mg by mouth daily.     fluticasone (FLOVENT HFA) 110 MCG/ACT inhaler INHALE 1 PUFF INTO THE LUNGS 2 TIMES A DAY. 12 g 3   losartan (COZAAR) 50 MG tablet Take 50 mg by mouth daily.     metFORMIN (GLUCOPHAGE-XR) 500 MG 24 hr tablet Take 500 mg by mouth daily with breakfast.      metoprolol succinate (TOPROL-XL) 50 MG 24 hr tablet Take 50 mg by mouth daily.      omeprazole (PRILOSEC) 20 MG capsule TAKE 1 CAPSULE BY MOUTH ONCE DAILY. 30 capsule 5   Pirfenidone (ESBRIET) 801 MG TABS Take 801 mg  by mouth 3 (three) times daily with meals. 270 tablet 1   REPATHA SURECLICK 409 MG/ML SOAJ Inject 1 mL into the skin every 14 (fourteen) days.     SAVELLA 50 MG TABS tablet Take 50 mg by mouth 2 (two) times daily.      triamcinolone cream (KENALOG) 0.1 % as needed.     tiZANidine (ZANAFLEX) 4 MG tablet TAKE (1) TABLET EVERY NIGHT AT BEDTIME. 30 tablet 0   No facility-administered medications prior to visit.     Review of Systems:   Constitutional: No weight loss or gain, night sweats, fevers, chills +fatigue (chronic, unchanged). HEENT: No headaches, difficulty swallowing, tooth/dental problems, or sore throat. No sneezing, itching, ear ache +nasal congestion, clear drainage (improved with antihistamine and intranasal steroid) CV:  No chest pain, orthopnea, PND, swelling in lower extremities, anasarca, dizziness, palpitations, syncope Resp: +shortness of breath with exertion; cough (baseline). No excess mucus or change in color of mucus. No hemoptysis. No wheezing.  No chest wall deformity GI:  No heartburn, indigestion, abdominal pain, nausea, vomiting, diarrhea, change in bowel habits, loss of appetite, bloody stools.  GU: No dysuria, change in color of urine, urgency or frequency.  No flank pain, no hematuria  Skin: No rash,  lesions, ulcerations MSK:  No joint pain or swelling.  No decreased range of motion.  No back pain. Neuro: No dizziness or lightheadedness.  Psych: +anxiety (stable). No SI/HI. Mood stable.     Physical Exam:  BP 130/80 (BP Location: Left Arm)   Pulse 87   Ht '5\' 5"'$  (1.651 m)   Wt 163 lb 12.8 oz (74.3 kg)   SpO2 94%   BMI 27.26 kg/m   GEN: Pleasant, interactive, well-appearing; in no acute distress. HEENT:  Normocephalic and atraumatic. PERRLA. Sclera white. Nasal turbinates erythematous, moist and patent bilaterally. No rhinorrhea present. Oropharynx pink and moist, without exudate or edema. No lesions, ulcerations, or postnasal drip.  NECK:  Supple w/  fair ROM. No JVD present. Normal carotid impulses w/o bruits. Thyroid symmetrical with no goiter or nodules palpated. No lymphadenopathy.   CV: RRR, no m/r/g, no peripheral edema. Pulses intact, +2 bilaterally. No cyanosis, pallor or clubbing. PULMONARY:  Unlabored, regular breathing. Bibasilar crackles otherwise clear bilaterally A&P w/o wheezes/rales/rhonchi. No accessory muscle use. No dullness to percussion. GI: BS present and normoactive. Soft, non-tender to palpation. No organomegaly or masses detected. No CVA tenderness. MSK: No erythema, warmth or tenderness. Cap refil <2 sec all extrem. No deformities or joint swelling noted.  Neuro: A/Ox3. No focal deficits noted.   Skin: Warm, no lesions or rashe Psych: Normal affect and behavior. Judgement and thought content appropriate.     Lab Results:  CBC    Component Value Date/Time   WBC 10.5 03/15/2017 1457   RBC 4.70 03/15/2017 1457   HGB 14.6 03/15/2017 1457   HCT 43.1 03/15/2017 1457   PLT 439 (H) 03/15/2017 1457   MCV 91.7 03/15/2017 1457   MCH 31.1 03/15/2017 1457   MCHC 33.9 03/15/2017 1457   RDW 11.8 03/15/2017 1457   LYMPHSABS 2,678 03/15/2017 1457   MONOABS 970 (H) 06/28/2016 1514   EOSABS 200 03/15/2017 1457   BASOSABS 147 03/15/2017 1457    BMET    Component Value Date/Time   NA 139 11/29/2021 1244   K 3.9 11/29/2021 1244   CL 103 11/29/2021 1244   CO2 28 11/29/2021 1244   GLUCOSE 92 11/29/2021 1244   BUN 23 11/29/2021 1244   CREATININE 0.78 11/29/2021 1244   CREATININE 0.73 03/15/2017 1457   CALCIUM 10.1 11/29/2021 1244   GFRNONAA 87 03/15/2017 1457   GFRAA 101 03/15/2017 1457    BNP No results found for: "BNP"   Imaging:  No results found.       Latest Ref Rng & Units 11/29/2021   11:32 AM 08/18/2021    9:16 AM 01/24/2021   10:55 AM 07/13/2020    1:01 PM 08/26/2019    1:39 PM 11/19/2018    2:57 PM  PFT Results  FVC-Pre L 2.10  P 2.14  2.12  2.32  2.36  2.29   FVC-Predicted Pre % 65  P 66  66   72  72  70   FVC-Post L      2.30   FVC-Predicted Post %      70   Pre FEV1/FVC % % 91  P 90  89  89  88  89   Post FEV1/FCV % %      90   FEV1-Pre L 1.91  P 1.91  1.88  2.07  2.08  2.04   FEV1-Predicted Pre % 78  P 78  77  84  83  82   FEV1-Post L  2.07   DLCO uncorrected ml/min/mmHg 14.80  P 15.98  14.63  13.98  15.62  15.75   DLCO UNC% % 72  P 78  72  68  76  77   DLCO corrected ml/min/mmHg 14.80  P 15.98  14.63  13.98  15.62    DLCO COR %Predicted % 72  P 78  72  68  76    DLVA Predicted % 113  P 109  114  107  113  118   TLC L      3.44   TLC % Predicted %      66   RV % Predicted %      50     P Preliminary result    No results found for: "NITRICOXIDE"      Assessment & Plan:   IPF (idiopathic pulmonary fibrosis) ([place]) Her pulmonary function testing showed stable lung function; slight reduction in DLCO when compared to 20 August 2021 but stable from November 2022. Symptom score was slightly worse. Given this, we will repeat HRCT for further evaluation. Her last one was 06/2020. She will continue on antifibrotic therapy with Esbriet. Declined pulmonary rehab. Walking oximetry today without desaturation.   Patient Instructions  Continue flovent 2 puffs Twice daily. Brush tongue and rinse mouth afterwards  Continue Albuterol inhaler 2 puffs every 6 hours as needed for shortness of breath or wheezing. Notify if symptoms persist despite rescue inhaler/neb use.  Continue Esbriet 801 mg Three times a day    -Wear sunscreen while outside   -Take with food  Continue omeprazole 1 capsule daily   Keep up the good work on exercising  High resolution CT chest ordered - someone will contact you for scheduling   Follow up in 3 months with Dr. Chase Caller and spiro/DLCO beforehand. If symptoms do not improve or worsen, please contact office for sooner follow up or seek emergency care.     I spent 35 minutes of dedicated to the care of this patient on the date of this encounter to  include pre-visit review of records, face-to-face time with the patient discussing conditions above, post visit ordering of testing, clinical documentation with the electronic health record, making appropriate referrals as documented, and communicating necessary findings to members of the patients care team.  Clayton Bibles, NP 12/02/2021  Pt aware and understands NP's role.

## 2021-11-29 NOTE — Patient Instructions (Signed)
Spirometry and DLCO Performed Today.  

## 2021-11-29 NOTE — Patient Instructions (Addendum)
Continue flovent 2 puffs Twice daily. Brush tongue and rinse mouth afterwards  Continue Albuterol inhaler 2 puffs every 6 hours as needed for shortness of breath or wheezing. Notify if symptoms persist despite rescue inhaler/neb use.  Continue Esbriet 801 mg Three times a day    -Wear sunscreen while outside   -Take with food  Continue omeprazole 1 capsule daily   Keep up the good work on exercising  High resolution CT chest ordered - someone will contact you for scheduling   Follow up in 3 months with Dr. Chase Caller and spiro/DLCO beforehand. If symptoms do not improve or worsen, please contact office for sooner follow up or seek emergency care.

## 2021-11-29 NOTE — Assessment & Plan Note (Signed)
Her pulmonary function testing showed stable lung function; slight reduction in DLCO when compared to 20 August 2021 but stable from November 2022. Symptom score was slightly worse. Given this, we will repeat HRCT for further evaluation. Her last one was 06/2020. She will continue on antifibrotic therapy with Esbriet. Declined pulmonary rehab. Walking oximetry today without desaturation.   Patient Instructions  Continue flovent 2 puffs Twice daily. Brush tongue and rinse mouth afterwards  Continue Albuterol inhaler 2 puffs every 6 hours as needed for shortness of breath or wheezing. Notify if symptoms persist despite rescue inhaler/neb use.  Continue Esbriet 801 mg Three times a day    -Wear sunscreen while outside   -Take with food  Continue omeprazole 1 capsule daily   Keep up the good work on exercising  High resolution CT chest ordered - someone will contact you for scheduling   Follow up in 3 months with Dr. Chase Caller and spiro/DLCO beforehand. If symptoms do not improve or worsen, please contact office for sooner follow up or seek emergency care.

## 2021-11-29 NOTE — Progress Notes (Signed)
Spirometry and DLCO Performed Today.  

## 2021-12-01 ENCOUNTER — Other Ambulatory Visit: Payer: Self-pay | Admitting: Physician Assistant

## 2021-12-01 NOTE — Telephone Encounter (Signed)
Next Visit: 01/10/2022   Last Visit: 05/02/2021   Last Fill: 10/31/2021   Dx: Other insomnia    Current Dose per office note on 05/02/2021: zanaflex 4 mg at bedtime for muscle spasms and insomnia.    Okay to refill Zanaflex?

## 2021-12-02 ENCOUNTER — Encounter: Payer: Self-pay | Admitting: Nurse Practitioner

## 2021-12-27 NOTE — Progress Notes (Signed)
Office Visit Note  Patient: Nichole Reyes             Date of Birth: 1950/05/19           MRN: [id number]             PCP: Lanelle Bal, PA-C Referring: Lanelle Bal, PA-C Visit Date: 01/10/2022 Occupation: '@GUAROCC'$ @  Subjective:  Pain in multiple joints  History of Present Illness: Nichole Reyes is a 71 y.o. female with history of osteoarthritis and fibromyalgia syndrome.  She states since the weather has been colder she has been experiencing increased pain and discomfort in multiple joints and muscles.  She complains of discomfort in her both hips, knees and her feet.  She also continues to have discomfort in her neck and lower back.  She has not noticed any joint swelling.  She complains of generalized pain and discomfort from fibromyalgia.  She still has difficulty sleeping and experiences fatigue.  She has been taking Zanaflex 4 mg p.o. nightly which has been helpful.  She continues to have some shortness of breath which has been stable on pirfenidone.  She has been followed by pulmonologist on a regular basis.  Activities of Daily Living:  Patient reports morning stiffness for all day. Patient Reports nocturnal pain.  Difficulty dressing/grooming: Denies Difficulty climbing stairs: Reports Difficulty getting out of chair: Reports Difficulty using hands for taps, buttons, cutlery, and/or writing: Denies  Review of Systems  Constitutional:  Positive for fatigue.  HENT:  Positive for mouth dryness. Negative for mouth sores.   Eyes:  Negative for dryness.  Respiratory:  Positive for shortness of breath.   Cardiovascular:  Negative for chest pain and palpitations.  Gastrointestinal:  Negative for blood in stool, constipation and diarrhea.  Endocrine: Positive for increased urination.  Genitourinary:  Negative for involuntary urination.  Musculoskeletal:  Positive for joint pain, joint pain and morning stiffness. Negative for gait problem, joint swelling, myalgias, muscle  weakness, muscle tenderness and myalgias.  Skin:  Negative for color change, rash, hair loss and sensitivity to sunlight.  Allergic/Immunologic: Negative for susceptible to infections.  Neurological:  Negative for dizziness and headaches.  Hematological:  Negative for swollen glands.  Psychiatric/Behavioral:  Positive for depressed mood and sleep disturbance. The patient is not nervous/anxious.     PMFS History:  Patient Active Problem List   Diagnosis Date Noted   IPF (idiopathic pulmonary fibrosis) ([place]) 09/29/2021   DOE (dyspnea on exertion) 09/29/2021   ILD (interstitial lung disease) ([place]) 08/26/2019   Fibromyalgia syndrome 06/26/2016   Other fatigue 06/26/2016   Other insomnia 06/26/2016   Arthralgia of both knees 06/26/2016   Pain in both feet 06/26/2016   Primary osteoarthritis of both feet 06/26/2016   Primary osteoarthritis of both knees 06/26/2016   DDD (degenerative disc disease), cervical 06/26/2016   DDD (degenerative disc disease), thoracic 06/26/2016   DDD (degenerative disc disease), lumbar 06/26/2016   History of hypertension 06/26/2016   History of high cholesterol 06/26/2016   Other sleep apnea 06/26/2016   History of osteopenia 06/26/2016   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   THYROID NODULE, LEFT 01/03/2008   HYPERLIPIDEMIA 01/03/2008   DEPRESSION 01/03/2008   MIGRAINE HEADACHE 01/03/2008   Allergic rhinitis 01/03/2008   ALOPECIA 01/03/2008   ALLERGIC ARTHRITIS SITE UNSPECIFIED 01/03/2008    Past Medical History:  Diagnosis Date   Coronary artery calcification seen on CT scan    Essential hypertension    Fibromyalgia    Idiopathic pulmonary  fibrosis ([place])    Mixed hyperlipidemia    Osteoporosis    Type 2 diabetes mellitus ([place])     Family History  Problem Relation Age of Onset   Hypertension Mother    Diabetes Mother    Osteoporosis Mother    Hypertension Father    Heart disease Father    COPD Father    Colon cancer Sister     Cancer Brother    Heart disease Brother    Heart disease Brother    Emphysema Paternal Grandfather    Past Surgical History:  Procedure Laterality Date   ABDOMINAL HYSTERECTOMY  2007   CATARACT EXTRACTION Bilateral    Social History   Social History Narrative   Not on file   Immunization History  Administered Date(s) Administered   Fluad Quad(high Dose 65+) 01/29/2019, 12/16/2019, 01/24/2021   Influenza Split 12/18/2017   Influenza-Unspecified 11/18/2013   PFIZER(Purple Top)SARS-COV-2 Vaccination 05/15/2019, 06/13/2019   Tdap 05/04/1999     Objective: Vital Signs: BP 116/77 (BP Location: Left Arm, Patient Position: Sitting, Cuff Size: Normal)   Pulse 76   Resp 17   Ht '5\' 5"'$  (1.651 m)   Wt 164 lb (74.4 kg)   BMI 27.29 kg/m    Physical Exam Vitals and nursing note reviewed.  Constitutional:      Appearance: She is well-developed.  HENT:     Head: Normocephalic and atraumatic.  Eyes:     Conjunctiva/sclera: Conjunctivae normal.  Cardiovascular:     Rate and Rhythm: Normal rate and regular rhythm.     Heart sounds: Normal heart sounds.  Pulmonary:     Effort: Pulmonary effort is normal.     Breath sounds: Normal breath sounds.  Abdominal:     General: Bowel sounds are normal.     Palpations: Abdomen is soft.  Musculoskeletal:     Cervical back: Normal range of motion.  Lymphadenopathy:     Cervical: No cervical adenopathy.  Skin:    General: Skin is warm and dry.     Capillary Refill: Capillary refill takes less than 2 seconds.  Neurological:     Mental Status: She is alert and oriented to person, place, and time.  Psychiatric:        Behavior: Behavior normal.      Musculoskeletal Exam: She had limited lateral rotation of the cervical spine.  She had significant thoracic kyphosis without any tenderness.  She had limited mobility in thoracic and lumbar region.  Shoulder joints, elbow joints, wrist joints with good range of motion.  There was no MCP PIP or  DIP thickening.  She had discomfort with range of motion of bilateral hip joints but no limitation was noted.  Knee joints in good range of motion.  There was no tenderness over ankles or MTPs.  She generalized hyperalgesia and tender points.  CDAI Exam: CDAI Score: -- Patient Global: --; Provider Global: -- Swollen: --; Tender: -- Joint Exam 01/10/2022   No joint exam has been documented for this visit   There is currently no information documented on the homunculus. Go to the Rheumatology activity and complete the homunculus joint exam.  Investigation: No additional findings.  Imaging: No results found.  Recent Labs: Lab Results  Component Value Date   WBC 10.5 03/15/2017   HGB 14.6 03/15/2017   PLT 439 (H) 03/15/2017   NA 139 11/29/2021   K 3.9 11/29/2021   CL 103 11/29/2021   CO2 28 11/29/2021   GLUCOSE 92 11/29/2021  BUN 23 11/29/2021   CREATININE 0.78 11/29/2021   BILITOT 0.3 11/29/2021   ALKPHOS 75 11/29/2021   AST 19 11/29/2021   ALT 18 11/29/2021   PROT 7.6 11/29/2021   ALBUMIN 4.1 11/29/2021   CALCIUM 10.1 11/29/2021   GFRAA 101 03/15/2017    Speciality Comments: No specialty comments available.  Procedures:  No procedures performed Allergies: Latex, Pseudoephedrine, Sulfa antibiotics, and Sulfonamide derivatives   Assessment / Plan:     Visit Diagnoses: Chronic pain of both hips-she has been experiencing increased pain in her bilateral hips since the weather has become colder.  Good range of motion of bilateral hip joints.  Stretching exercises were emphasized.  Primary osteoarthritis of both knees-she has off-and-on discomfort in her knee joints.  No warmth swelling or effusion was noted.  Primary osteoarthritis of both feet-she has chronic discomfort in her feet.  No tenderness was noted on the examination.  DDD (degenerative disc disease), cervical-she had limitation with range of motion of the cervical spine without discomfort.  Postural  kyphosis of thoracic region-she had postural kyphosis.  Stretching exercises were demonstrated and discussed.  DDD (degenerative disc disease), thoracic - Thoracic kyphosis noted.    DDD (degenerative disc disease), lumbar-she had neck lower back pain.  ILD (interstitial lung disease) ([place]) - Dyspnea started summer 2020.  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  Followed  by Dr. Chase Caller.  I reviewed the note from November 29, 2021 her PFTs were stable.  She is on pirfenidone.  Fibromyalgia-she continues to have generalized pain and discomfort which is getting worse with the weather change.  Regular exercise and stretching was discussed.  Other fatigue-related to insomnia and fibromyalgia.  Other insomnia -she takes zanaflex 4 mg at bedtime for muscle spasms and insomnia.   Osteopenia of multiple sites - DEXA updated on 08/12/19: L1-L4 BMD 0.813 with T-score -2.1. DXA ordered by PCP. Thoracic kyphosis noted. No history of fractures.  Advised her to get repeat DEXA scan with her GYN.  Family history of rheumatoid arthritis - Maternal grandmother.  History of hypertension-blood pressure was normal today.  History of hypercholesterolemia  History of migraine  History of depression  History of sleep apnea  Orders: No orders of the defined types were placed in this encounter.  No orders of the defined types were placed in this encounter.    Follow-Up Instructions: Return in about 6 months (around 07/12/2022) for Osteoarthritis.   Bo Merino, MD  Note - This record has been created using Editor, commissioning.  Chart creation errors have been sought, but may not always  have been located. Such creation errors do not reflect on  the standard of medical care.

## 2021-12-28 ENCOUNTER — Other Ambulatory Visit: Payer: Self-pay | Admitting: Rheumatology

## 2021-12-29 ENCOUNTER — Other Ambulatory Visit: Payer: Self-pay | Admitting: Rheumatology

## 2021-12-29 NOTE — Telephone Encounter (Signed)
Next Visit: 01/10/2022   Last Visit: 05/02/2021   Last Fill: 12/01/2021   Dx: Other insomnia    Current Dose per office note on 05/02/2021: zanaflex 4 mg at bedtime for muscle spasms and insomnia.    Okay to refill Zanaflex?

## 2022-01-03 ENCOUNTER — Ambulatory Visit (HOSPITAL_COMMUNITY): Payer: Medicare Other

## 2022-01-06 ENCOUNTER — Ambulatory Visit (HOSPITAL_BASED_OUTPATIENT_CLINIC_OR_DEPARTMENT_OTHER): Payer: Medicare Other | Admitting: Internal Medicine

## 2022-01-10 ENCOUNTER — Ambulatory Visit: Payer: Medicare Other | Attending: Rheumatology | Admitting: Rheumatology

## 2022-01-10 ENCOUNTER — Encounter: Payer: Self-pay | Admitting: Rheumatology

## 2022-01-10 VITALS — BP 116/77 | HR 76 | Resp 17 | Ht 65.0 in | Wt 164.0 lb

## 2022-01-10 DIAGNOSIS — G4709 Other insomnia: Secondary | ICD-10-CM

## 2022-01-10 DIAGNOSIS — M5030 Other cervical disc degeneration, unspecified cervical region: Secondary | ICD-10-CM | POA: Diagnosis not present

## 2022-01-10 DIAGNOSIS — Z8659 Personal history of other mental and behavioral disorders: Secondary | ICD-10-CM

## 2022-01-10 DIAGNOSIS — Z8679 Personal history of other diseases of the circulatory system: Secondary | ICD-10-CM

## 2022-01-10 DIAGNOSIS — M5136 Other intervertebral disc degeneration, lumbar region: Secondary | ICD-10-CM

## 2022-01-10 DIAGNOSIS — J849 Interstitial pulmonary disease, unspecified: Secondary | ICD-10-CM

## 2022-01-10 DIAGNOSIS — G8929 Other chronic pain: Secondary | ICD-10-CM

## 2022-01-10 DIAGNOSIS — Z8669 Personal history of other diseases of the nervous system and sense organs: Secondary | ICD-10-CM

## 2022-01-10 DIAGNOSIS — M25552 Pain in left hip: Secondary | ICD-10-CM

## 2022-01-10 DIAGNOSIS — M170 Bilateral primary osteoarthritis of knee: Secondary | ICD-10-CM

## 2022-01-10 DIAGNOSIS — Z8639 Personal history of other endocrine, nutritional and metabolic disease: Secondary | ICD-10-CM

## 2022-01-10 DIAGNOSIS — M25551 Pain in right hip: Secondary | ICD-10-CM | POA: Diagnosis not present

## 2022-01-10 DIAGNOSIS — M5134 Other intervertebral disc degeneration, thoracic region: Secondary | ICD-10-CM

## 2022-01-10 DIAGNOSIS — R5383 Other fatigue: Secondary | ICD-10-CM

## 2022-01-10 DIAGNOSIS — M8589 Other specified disorders of bone density and structure, multiple sites: Secondary | ICD-10-CM

## 2022-01-10 DIAGNOSIS — M4004 Postural kyphosis, thoracic region: Secondary | ICD-10-CM

## 2022-01-10 DIAGNOSIS — Z8261 Family history of arthritis: Secondary | ICD-10-CM

## 2022-01-10 DIAGNOSIS — M19071 Primary osteoarthritis, right ankle and foot: Secondary | ICD-10-CM

## 2022-01-10 DIAGNOSIS — M79671 Pain in right foot: Secondary | ICD-10-CM

## 2022-01-10 DIAGNOSIS — M51369 Other intervertebral disc degeneration, lumbar region without mention of lumbar back pain or lower extremity pain: Secondary | ICD-10-CM

## 2022-01-10 DIAGNOSIS — M19072 Primary osteoarthritis, left ankle and foot: Secondary | ICD-10-CM

## 2022-01-10 DIAGNOSIS — M797 Fibromyalgia: Secondary | ICD-10-CM

## 2022-01-10 NOTE — Patient Instructions (Signed)

## 2022-01-30 ENCOUNTER — Other Ambulatory Visit: Payer: Self-pay | Admitting: Rheumatology

## 2022-01-30 NOTE — Telephone Encounter (Signed)
Next Visit: 07/18/2022  Last Visit: 01/10/2022  Last Fill: 12/29/2021  Dx: Other insomnia   Current Dose per office note on 01/10/2022: zanaflex 4 mg at bedtime for muscle spasms and insomnia.   Okay to refill Zanaflex?

## 2022-02-14 ENCOUNTER — Telehealth: Payer: Self-pay | Admitting: Internal Medicine

## 2022-02-14 DIAGNOSIS — J84112 Idiopathic pulmonary fibrosis: Secondary | ICD-10-CM

## 2022-02-15 MED ORDER — ESBRIET 801 MG PO TABS: 801.0000 mg | ORAL_TABLET | Freq: Three times a day (TID) | ORAL | 0 refills | Status: DC

## 2022-02-15 NOTE — Telephone Encounter (Signed)
Refill sent for [place] to [facility] ([place]) for [place]: [phone]  Dose: 801 mg three times daily  Last OV: 11/29/21 Provider: Dr. Chase Caller  Next OV: 02/28/22  Last LFTs on 11/29/21 wnl  Jennye Boroughs Wilhemina Bonito, PharmD, MPH, BCPS Clinical Pharmacist (Rheumatology and Pulmonology)

## 2022-02-17 DIAGNOSIS — S42309A Unspecified fracture of shaft of humerus, unspecified arm, initial encounter for closed fracture: Secondary | ICD-10-CM

## 2022-02-17 HISTORY — DX: Unspecified fracture of shaft of humerus, unspecified arm, initial encounter for closed fracture: S42.309A

## 2022-02-21 ENCOUNTER — Other Ambulatory Visit: Payer: Self-pay | Admitting: Internal Medicine

## 2022-02-21 DIAGNOSIS — J849 Interstitial pulmonary disease, unspecified: Secondary | ICD-10-CM

## 2022-02-21 DIAGNOSIS — K219 Gastro-esophageal reflux disease without esophagitis: Secondary | ICD-10-CM

## 2022-02-21 DIAGNOSIS — R0609 Other forms of dyspnea: Secondary | ICD-10-CM

## 2022-02-27 ENCOUNTER — Other Ambulatory Visit: Payer: Self-pay | Admitting: Internal Medicine

## 2022-02-27 DIAGNOSIS — J84112 Idiopathic pulmonary fibrosis: Secondary | ICD-10-CM

## 2022-02-28 ENCOUNTER — Encounter: Payer: Self-pay | Admitting: Internal Medicine

## 2022-02-28 ENCOUNTER — Ambulatory Visit: Payer: Medicare Other | Admitting: Internal Medicine

## 2022-02-28 ENCOUNTER — Other Ambulatory Visit: Payer: Self-pay | Admitting: Internal Medicine

## 2022-02-28 ENCOUNTER — Other Ambulatory Visit (HOSPITAL_BASED_OUTPATIENT_CLINIC_OR_DEPARTMENT_OTHER): Payer: Medicare Other | Admitting: Radiology

## 2022-02-28 ENCOUNTER — Ambulatory Visit (INDEPENDENT_AMBULATORY_CARE_PROVIDER_SITE_OTHER): Payer: Medicare Other | Admitting: Internal Medicine

## 2022-02-28 VITALS — BP 124/68 | HR 88 | Temp 98.2°F | Ht 65.0 in | Wt 162.2 lb

## 2022-02-28 DIAGNOSIS — J84112 Idiopathic pulmonary fibrosis: Secondary | ICD-10-CM

## 2022-02-28 DIAGNOSIS — K219 Gastro-esophageal reflux disease without esophagitis: Secondary | ICD-10-CM | POA: Diagnosis not present

## 2022-02-28 DIAGNOSIS — R0609 Other forms of dyspnea: Secondary | ICD-10-CM

## 2022-02-28 DIAGNOSIS — Z7185 Encounter for immunization safety counseling: Secondary | ICD-10-CM

## 2022-02-28 DIAGNOSIS — Z5181 Encounter for therapeutic drug level monitoring: Secondary | ICD-10-CM | POA: Diagnosis not present

## 2022-02-28 DIAGNOSIS — Z006 Encounter for examination for normal comparison and control in clinical research program: Secondary | ICD-10-CM

## 2022-02-28 LAB — PHOSPHORUS: Phosphorus: 3.9 mg/dL (ref 2.3–4.6)

## 2022-02-28 LAB — ALKALINE PHOSPHATASE: Alkaline Phosphatase: 75 U/L (ref 39–117)

## 2022-02-28 LAB — VITAMIN D 25 HYDROXY (VIT D DEFICIENCY, FRACTURES): VITD: 49.16 ng/mL (ref 30.00–100.00)

## 2022-02-28 LAB — CALCIUM: Calcium: 9.8 mg/dL (ref 8.4–10.5)

## 2022-02-28 NOTE — Patient Instructions (Addendum)
IPF (idiopathic pulmonary fibrosis) ([facility]) Medication monitoring encounter Gastroesophageal reflux disease, unspecified whether esophagitis present Dyspnea on exertion Vaccine counseling  -IPF 7% worse sept 2020 -> Nov 2022 (-> stable through June 2023 -> dec 2023  -Nausea and treated with pirfenidone after adequate spacing  -Liver function test normal with primary care physician November 2023  -Normal echocardiogram August 2023  Plan  -Continue acid reflux medications -Continue pirfenidone per schedule   -At this point in time benefit outweighs risk  -Continue space 5-6h between meals and apply sunscreen when going out -We discussed clinical trials as a care option and appreciate your interest  -meet LeighAnn of [facility] to enroll in bone  scan DEXA study if you qualif  -Do high-resolution CT chest supine and prone in March 2024  -Do repeat spirometry and DLCO in 3 -4 months  -Glad you are getting RSV and flu vaccine tomorrow on your own - LFT every 3 months  Drug-induced weight loss  - continued weight loss slowly with pirfenidone and recent COVID  -At this point in time is still are technically overweight but you are getting closer to normal weight -Therefore we will leverage pirfenidone and helping you with your weight loss -We will continue to monitor -Do talk to primary care physician about other reasons for weight loss   Followup -Return to see  DR Chase Caller in 3-4 months - 30 min visit but after spiro/dlco amnd HRcT

## 2022-02-28 NOTE — Progress Notes (Signed)
OV 11/19/2018: Nichole Reyes is a 71 year old woman referred for evaluation of dyspnea for the past several months.  In the same time interval she is also noticed fatigue.  She has previously undergone a negative cardiac evaluation and an outside CT scan of her chest.  Per report, the CT demonstrates fibrotic changes. She is a never- smoker.  She has previously been evaluated by rheumatology, Dr. Estanislado Pandy, for fibromyalgia and osteoarthritis.  She began to notice dyspnea on exertion when exercising in February 2020.  She had previously been able to walk 35 to 40 minutes, but decreased to about 20 minutes at a time due to dyspnea.  She was hospitalized in [place] in June 2020 for dyspnea. At that time she was COVID negative.  She was discharged on albuterol, which she has been using up to 4 times a day, which improves her symptoms.  There is no family history of fibrotic lung disease; multiple family members had COPD.  Her maternal grandmother had rheumatoid arthritis, but otherwise there is no family history of rheumatologic disease.  In addition to dyspnea on exertion with occasional dyspnea at rest she endorses cough and sputum production.  She denies wheezing, postnasal drip, coughing or choking when eating, Raynaud's, muscle weakness, rashes, dry eyes or mouth, joint swelling, prolonged morning stiffness.  She has occasional hand stiffness throughout the day, not associated with rest.  She has a hiatal hernia with occasional GERD with symptoms less than once a week, associated with certain foods.  She is a never smoker or vaper.  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  She has never worked in [employer] or shipyards.  She does not have any dusty hobbies.  She has a Programmer, systems, but has never had pet birds.  She does not regularly use hot tubs.  She has never had exposure to medical radiation, methotrexate,  Macrobid, amiodarone, bleomycin.  Her only new medication was Zetia last month for hypertriglyceridemia.  Otherwise she has not had medication changes in greater than a year.  Last month she has had 6 episodes of epistaxis, which has improved with use of intranasal steroids prescribed by her PCP.     OV 05/06/2019  Subjective:  Patient ID: Nichole Reyes, female , DOB: Sep 07, 1950 , age 11 y.o. , MRN: [id number] , ADDRESS: [place] [address]   05/06/2019 -   Chief Complaint  Patient presents with   Follow-up    Pt states she has both good days and bad days with breathing. Pt states she will occ have to use her rescue inhaler at least 1-2 times with activities.     Nichole Nichole Reyes 71 y.o. -referred by Dr. 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  She was first seen by Dr. Carlis Abbott in September 2020 noted above.  She followed up in December 2020 and had pulmonary function test.  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  Due to insurance delays she has not started the nintedanib yet.  She is wondering about this.  She has specific questions about taking Covid vaccine.  She is also worried about her significant cough.  Review shows she is on ACE inhibitor.  She has a tickle in the throat.  She has acid reflux and is on Prilosec.  She has a history of  hiatal hernia.  She is taken a decision on starting nintedanib but she is interested in hearing about the other antifibrotic pirfenidone because of insurance delay she has not started on anything as yet.   [facility] Comprehensive ILD Questionnaire  Symptoms:  -Insidious onset of shortness of breath 7 months ago.  Since it started it has been the same.  She also has episodic amount of dyspnea.  The severity classification is below.  She does not know when her cough started but she does have a cough associated with clearing of the throat.  It is  moderate in intensity.  She does cough at night.  She does bring up phlegm.  The phlegm is usually white but sometimes yellow.  She does feel a tickle in her throat but does not wake up in the middle of the night because of the cough.  There is no wheezing.  Cough is associated with ACE inhibitor intake.    Past Medical History : Positive for fibromyalgia, hiatal hernia, sleep apnea both for the last few years.  There is also borderline diabetes..  And hyperlipidemia   she denies any asthma or COPD or heart failure rheumatoid arthritis or scleroderma or lupus.  Denies any polymyositis.  Denies Sjogren's.  Denies HIV.  Denies pulmonary hypertension.  Denies thyroid disease or stroke.  Denies seizures or mononucleosis or hepatitis or tuberculosis.  Denies kidney disease.  Denies pneumonia.  Denies history of blood clots or heart disease or pleurisy.  Autoimmune serology sept 2020  - normal Results for [name], [name] (MRN [id number]) as of 05/06/2019 12:01  Ref. Range 11/19/2018 14:58  SEE BELOW Unknown Comment  Anti Nuclear Antibody (ANA) Latest Ref Range: Negative  Negative  Anti JO-1 Latest Ref Range: 0.0 - 0.9 AI <0.2  CENTROMERE AB SCREEN Latest Ref Range: 0.0 - 0.9 AI <0.2  dsDNA Ab Latest Ref Range: 0 - 9 IU/mL <1  ENA RNP Ab Latest Ref Range: 0.0 - 0.9 AI <0.2  ENA SSA (RO) Ab Latest Ref Range: 0.0 - 0.9 AI <0.2  ENA SSB (LA) Ab Latest Ref Range: 0.0 - 0.9 AI <0.2  ENA SM Ab Ser-aCnc Latest Ref Range: 0.0 - 0.9 AI <0.2  Chromatin Ab SerPl-aCnc Latest Ref Range: 0.0 - 0.9 AI <0.2  Anti-Jo-1 Ab (RDL) Latest Ref Range: <20 Units <20  Anti-PL-7 Ab (RDL) Unknown CANCELED  Anti-PL-12 Ab (RDL) Unknown CANCELED  Anti-EJ Ab (RDL) Unknown CANCELED  Anti-OJ Ab (RDL) Unknown CANCELED  Anti-SRP Ab (RDL) Unknown CANCELED  Anti-Mi-2 Ab (RDL) Unknown CANCELED  Anti-TIF-1gamma Ab (RDL) Latest Units: Units CANCELED  Anti-MDA-5 Ab (CADM-140)(RDL) Latest Units: Units CANCELED  Anti-NXP-2 (P140) Ab  (RDL) Latest Units: Units CANCELED  Anti-SAE1 Ab, IgG (RDL) Latest Units: Units CANCELED  Anti-PM/Scl-100 Ab (RDL) Latest Ref Range: <20 Units <20  Anti-Ku Ab (RDL) Unknown CANCELED  Anti-SS-A 52kD Ab, IgG (RDL) Latest Ref Range: <20 Units <20  Anti-U1 RNP Ab (RDL) Latest Units: Units CANCELED    ROS: She has fatigue for the last several years and also arthralgia.  She attributes this to fibromyalgia.  She also has pain in her fingers which she attributes due to fibromyalgia.  She has heartburn from hiatal hernia she says.  She also snores because of her sleep apnea and she has a rash related to eczema.  But otherwise negative.   FAMILY HISTORY of LUNG DISEASE: She thinks her dad might have had COPD and her grandfather.  However nobody with pulmonary fibrosis or  other lung diseases.   EXPOSURE HISTORY: Denies ever having smoked cigarettes although she grew up in a home with her dad smoked.  Therefore positive for passive smoking as a child.  Denies cigar use.  Denies marijuana use denies cocaine use denies IV drug use.  Denies vaping.   HOME and HOBBY DETAILS : [place]  She is lived in this home for 22 years.  The home is 69 years old.  She has noticed mildew periodically in the shower curtain and in the bathroom.  She does use a CPAP mask but there is no water circuit mold or mildew in it.  She does do gardening with the flower garden she does use a steam iron but there is no mold or mildew in it.  There is no pet birds.  No feather pillow or duvet.  No pet gerbils.  No mold in the [facility] duct.  Does not play any wind instruments.  Does not use a humidifier.  Does not have a Jacuzzi in the house.   OCCUPATIONAL HISTORY (122 questions) : Essentially negative.  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   PULMONARY TOXICITY HISTORY (27 items): Negative except prednisone use    Results for MELENIE, MINNIEAR (MRN [id number]) as of 05/06/2019 12:01  Ref. Range 11/19/2018 but ? Done 02/28/2019 14:57  FVC-Pre Latest Units: L 2.29  FVC-%Pred-Pre Latest Units: % 70   Results for CAILEN, TEXEIRA (MRN [id number]) as of 05/06/2019 12:01  Ref. Range 11/19/2018 but ? Done 02/28/2019 14:57  DLCO unc Latest Units: ml/min/mmHg 15.75  DLCO unc % pred Latest Units: % 77    She had a high-resolution CT chest September 24, 2018 at [facility].  This was read by our radiologist Dr Eddie Candle..  I do not have the image to visualized but I trust this report.  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  In addition is described a 1.4 cm right upper lobe posterior segment inferior part nodule that is subpleural and also 8 mm subpleural right middle lobe nodule.  He says these findings are unchanged compared to 2008 and are benign.  OV 06/03/2019 -telephone visit.  Patient identified with 2 person identifier.  Risks, benefits and limitations of telephone visit explained.  Subjective:  Patient ID: Nichole Reyes, female , DOB: November 07, 1950 , age 60 y.o. , MRN: [id number] , ADDRESS: [address] [place] [address]   Clinical IPF diagnosis given dec 2020 and reiterated Mid Feb 2021:  based on your age greater than 65, clubbing on exam, CT scan with high probability for this condition, negative history on your questionnaire and negative blood test on your serology exams   06/03/2019 -  Fu IPF - focus on esbriet monitoring and any other questions she might have   Nichole Nichole Reyes 71 y.o. - on Esbriet now. Started it 05/22/2019 Thursday. Currently on 2 pills tid. Taking it with food. Spacing 5-6h between dosing. No side effects at this point.  From a dyspnea standpoint she is stable.  She had questions about being able to do gardening.  We discussed the fact this could cause mold  exposure and organic dust exposure that could irritate her IPF.  She says she can do it with masking.  Gardening gives her purpose so I supported it.  She also plan [place] last week and was quite short of breath and took an inhaler.  I have suggested that she use a pulse ox meter and monitor her pulse ox and call us if her pulse ox below 88% so we can order portable oxygen [obviously will have to do stress testing submaximal 6-minute walk test] otherwise doing well.    OV 12/16/2019   Subjective:  Patient ID: Nichole Reyes, female , DOB: 1950-08-26, age 71 y.o. years. , MRN: [id number],  ADDRESS: [place] [address] PCP  Rory Percy, MD Providers : Treatment Team:  Attending Provider: Brand Males, MD   Chief Complaint  Patient presents with   Follow-up    Tired all the time. redness/itching to bilateral nick and insides of  arms.  stress/heat?     Clinical IPF diagnosis given dec 2020 and reiterated Mid Feb 2021:  based on your age greater than 65, clubbing on exam, CT scan with high probability for this condition, negative history on your questionnaire and negative blood test on your serology exams . - on Esbriet now. Started it 05/22/2019   GERD/Hiatal Hernia  Nichole Nichole Reyes 71 y.o. -returns for follow-up.  Last seen in early part of 2021.  After that she see nurse practitioner 2 times once or at least telephone visit.  She tried it in pulmonary rehabilitation for IPF but she had logistical issues.  Early morning she has to get her husband ready for work.  Late in the afternoon she has to pick up her granddaughter.  She also has knee issues from a fibromyalgia.  Therefore is not able to do pulmonary rehabilitation.  She is very compliant with her pirfenidone.  She feels she is tolerating it fine but she did admit to low appetite and also has a 7 pound weight loss.  She is also fatigued.  She thinks the fatigue is present for the last few months.  She  thinks it might be related to her daughter's wedding but then the fatigue has been there for the last few months while the daughter's wedding happened a few weeks ago.  She is not on oxygen.  Walking desaturation test is stable.  Most recent pulmonary function test is stable.  Symptom score other than fatigue is also stable.  She continues PPI for her acid reflux.      OV 05/06/2020  Subjective:  Patient ID: Nichole Reyes, female , DOB: Mar 15, 1951 , age 71 y.o. , MRN: [id number] , ADDRESS: [place] [address] PCP Rory Percy, MD Patient Care Team: Rory Percy, MD as PCP - General (Family Medicine) Herminio Commons, MD (Inactive) as PCP - Cardiology (Cardiology)  This Provider for this visit: Treatment Team:  Attending Provider: Brand Males, MD    05/06/2020 -   Chief Complaint  Patient presents with   Follow-up    SOB with exertion    Clinical IPF diagnosis given dec 2020 and reiterated Mid Feb 2021:  based on your age greater than 65, clubbing on exam, CT scan with high probability for this condition, negative history on your questionnaire and negative blood test on your serology exams . - on Esbriet now. Started it 05/22/2019   GERD/Hiatal Hernia  Last CT scan of the chest July 2020 at [facility] with probable UIP Last PFT June 2021  Nichole Nichole Reyes 71 y.o. -presents for follow-up.  Last seen in  September 2021.  Since then she continues to deal with her right knee issue.  She is seen Dr. Bo Merino for this.  Apparently a knee MRI is pending.  She is continuing to lose weight.  She has lost 10 pounds in the last 1 year.  This correlates with starting pirfenidone.  Technically she is still overweight.  She is also reporting associated fatigue and metallic taste.  A few months ago she made a call and we told her to hold the pirfenidone.  After that the metallic taste largely resolved but it does come back although to a much lesser severity.   At this point in time it does not bother her too much so she think she will continue to take pirfenidone.  From a respiratory standpoint she feels stable although she would say that in the last 1 year she feels the disease might be slowly progressive.  Her symptom score shows slight worsening especially with shortness of breath and weight loss but same fatigue.  Some nausea with pirfenidone.  He was supposed to pulmonary function testing today but for some reason has not happened.  Last PFT was June 2021.  Last CT scan of the chest was July 2020 I do not have echocardiogram in our system.  She did have a low risk nuclear medicine stress test with 59% in June 2020   [place] 07/13/2020  Subjective:  Patient ID: Nichole Reyes, female , DOB: Sep 25, 1950 , age 30 y.o. , MRN: [id number] , ADDRESS: [place] [address] PCP Lanelle Bal, PA-C Patient Care Team: Lanelle Bal, PA-C as PCP - General (Family Medicine) Herminio Commons, MD (Inactive) as PCP - Cardiology (Cardiology)  This Provider for this visit: Treatment Team:  Attending Provider: Brand Males, MD    07/13/2020 -   Chief Complaint  Patient presents with   Follow-up    No concerns, here to get PFT results.    Clinical IPF diagnosis given dec 2020 and reiterated Mid Feb 2021:  based on your age greater than 65, clubbing on exam, CT scan with high probability for this condition, negative history on your questionnaire and negative blood test on serology exams . - Oon Esbriet since 05/22/2019   GERD/Hiatal Hernia  Last PFT April 2022 Last CT scan of the chest March 2022   Nichole Nichole Reyes 71 y.o. -returns for follow-up to discuss her test results.  She definitely has more shortness of breath now than she did in September 2021.  She continues to lose weight.  But her BMI is still 27.  No metallic taste no nausea vomiting or diarrhea.  No chest pains.  Her pulmonary function test shows slight decline in DLCO but  FVC is stable.  Walking desaturation to stable.  High-resolution CT chest shows stability in ILD compared to 2020.  Therefore overall she is probably stable.  There is evidence of coronary artery calcification and also aortic valve calcifications on the CT.  Her echo showed mild diastolic dysfunction that could be contributing to the dyspnea.  There is also mild aortic valve sclerosis.  There is no chest pain with exertion.  Her last cardiologist in [place] is no longer in the health system.  She is not seen a cardiologist in 7 years.  Does not recollect when her last stress test was.   WE discussed   1. Scientific Purpose  Clinical research is designed to produce generalizable knowledge and to answer questions about the safety and efficacy  of intervention(s) under study in order to determine whether or not they may be useful for the care of future patients.  2. Study Procedures  Participation in a trial may involve procedures or tests, in addition to the intervention(s) under study, that are intended only or primarily to generate scientific knowledge and that are otherwise not necessary for patient care.   3. Uncertainty  For intervention(s) under study in clinical research, there often is less knowledge and more uncertainty about the risks and benefits to a population of trial participants than there is when a doctor offers a patient standard interventions.   4. Adherence to Protocol  Administration of the intervention(s) under study is typically based on a strict protocol with defined dose, scheduling, and use or avoidance of concurrent medications, compared to administration of standard interventions.  5. Clinician as Investigator  Clinicians who are in health care settings provide treatment; in a clinical trial setting, they are also investigating safety and efficacy of an intervention. In otherwise your doctor or nurse practitioner can be wearing 2 hats - one as care giver another as  Company secretary  6. Patient as Visual merchandiser Subject  Patients participating in research trials are research subjects or volunteers. In other words participating in research is 100% voluntary and at one's own free weill. The decision to participate or not participate will NOT affect patient care and the doctor-patient relationship in any way   ECHO 06/29/20  IMPRESSIONS     1. Left ventricular ejection fraction, by estimation, is 60 to 65%. The  left ventricle has normal function. The left ventricle has no regional  wall motion abnormalities. There is mild left ventricular hypertrophy.  Left ventricular diastolic parameters  are consistent with Grade I diastolic dysfunction (impaired relaxation).   2. Right ventricular systolic function is normal. The right ventricular  size is normal. There is normal pulmonary artery systolic pressure. The  estimated right ventricular systolic pressure is 41.9 mmHg.   3. The mitral valve is grossly normal. Trivial mitral valve  regurgitation.   4. The aortic valve is tricuspid. Aortic valve regurgitation is not  visualized. Mild aortic valve sclerosis is present, with no evidence of  aortic valve stenosis.   5. The inferior vena cava is normal in size with greater than 50%  respiratory variability, suggesting right atrial pressure of 3 mmHg.   Comparison(s): No prior Echocardiogram.    CT Chest data march 22022  Narrative & Impression  CLINICAL DATA:  71 year old female with history of chronic dyspnea. Fibrotic changes noted on prior CT examination. Follow-up study.   EXAM: CT CHEST WITHOUT CONTRAST   TECHNIQUE: Multidetector CT imaging of the chest was performed following the standard protocol without intravenous contrast. High resolution imaging of the lungs, as well as inspiratory and expiratory imaging, was performed.   COMPARISON:  No priors available for comparison.   FINDINGS: Cardiovascular: Heart size is normal.  There is no significant pericardial fluid, thickening or pericardial calcification. There is aortic atherosclerosis, as well as atherosclerosis of the great vessels of the mediastinum and the coronary arteries, including calcified atherosclerotic plaque in the left main, left anterior descending, left circumflex and right coronary arteries. Calcifications of the aortic valve.   Mediastinum/Nodes: No pathologically enlarged mediastinal or hilar lymph nodes. Please note that accurate exclusion of hilar adenopathy is limited on noncontrast CT scans. Esophagus is unremarkable in appearance. No axillary lymphadenopathy.   Lungs/Pleura: High-resolution images demonstrate widespread areas of peripheral predominant ground-glass attenuation, septal thickening,  mild cylindrical bronchiectasis and peripheral bronchiolectasis. Findings have a mild craniocaudal gradient. No definitive honeycombing is identified. Inspiratory and expiratory imaging is unremarkable. No acute consolidative airspace disease. No pleural effusions. In the periphery of the right middle lobe (axial image 89 of series 6) there is a 7 x 5 mm (mean diameter of 6 mm) subpleural pulmonary nodule (stable compared to prior study 09/24/2018). In the superior segment of the right lower lobe (axial image 73 of series 6) there is a 2.3 x 0.8 cm elongated nodular area of architectural distortion which is intimately associated with an accessory fissure, which appears slightly larger on axial images, but is completely stable in size and appearance when viewed on sagittal reconstructions.   Upper Abdomen: Aortic atherosclerosis.   Musculoskeletal: There are no aggressive appearing lytic or blastic lesions noted in the visualized portions of the skeleton.   IMPRESSION: 1. 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 Repeat high-resolution chest CT is recommended in 12 months to assess for temporal changes in the appearance of the lung parenchyma. 2. Pulmonary nodules in the right lung are stable compared to the prior study from 2020, likely benign. Attention at time of repeat high-resolution chest CT is recommended to ensure continued stability. 3. Aortic atherosclerosis, in addition to left main and 3 vessel coronary artery disease. Please note that although the presence of coronary artery calcium documents the presence of coronary artery disease, the severity of this disease and any potential stenosis cannot be assessed on this non-gated CT examination. Assessment for potential risk factor modification, dietary therapy or pharmacologic therapy may be warranted, if clinically indicated. 4. There are calcifications of the aortic valve. Echocardiographic correlation for evaluation of potential valvular dysfunction may be warranted if clinically indicated.   Aortic Atherosclerosis (ICD10-I70.0).   Electronically Signed: By: Vinnie Langton M.D. On: 06/01/2020 09:51  ADDENDUM REPORT: 06/21/2020 14:33   ADDENDUM: It was brought to my attention that there is a prior chest CT from 09/24/2018. After comparison between the 05/31/2020 exam and the 09/24/2018 exam, there is no evidence of progression. Assessment remains unchanged. Specifically, based on the spectrum of findings this continues to be categorized as probable usual interstitial pneumonia (UIP) per current ATS guidelines.     Electronically Signed   By: Vinnie Langton M.D.   On: 06/21/2020 14:33        OV 01/24/2021  Subjective:  Patient ID: Nichole Reyes, female , DOB: [birth year] , age 23 y.o. , MRN: [id number] , ADDRESS: [address] [place] [address] PCP Lanelle Bal, PA-C Patient Care Team: Lanelle Bal, PA-C as PCP - General (Family Medicine) Satira Sark, MD as PCP - Cardiology  (Cardiology)  This Provider for this visit: Treatment Team:  Attending Provider: Brand Males, MD    01/24/2021 -   Chief Complaint  Patient presents with   Follow-up    F/U after PFT. States her breathing has been stable since last visit with SOB with exertion.        Nichole Nichole Reyes 71 y.o. -returns for follow-up.  In this visit she has had pulmonary function test.  It shows a 7% decline in FVC and DLCO in 2 years.  Is roughly 3.5 %/year.  She is actually having more shortness of breath.  She did see cardiology and had a normal Myoview stress test.  She has been reassured  from a cardiology standpoint.  But has dyspnea score is worse.  She is unable to do pulmonary rehabilitation because of issues with family and also because she has osteoarthritis of her knees.  She is seeing rheumatology for this.  She says she has never seen orthopedic for this.  Did indicate to her to talk to rheumatology and see if at this time to do a knee replacement and if she needs an orthopedic referral.  Did indicate that at some point the ILD could get worse and she would be quite dyspneic and unable to mobilize.  But at this point from a safety standpoint would be a good point in time to do a knee replacement if the knees are really bad.  She understood this.  We did discuss worsening.  She is on maximal standard of care.  We did discuss clinical trials as a care option she is interested.  She has been losing weight with pirfenidone there is more weight loss.  Her BMI is 27 and technically she is still overweight.  We decided to continue with pirfenidone.  She will have a liver function test today.  She will have a high-dose flu shot today.     OV 08/18/2021  Subjective:  Patient ID: Nichole Reyes, female , DOB: [birth year] , age 40 y.o. , MRN: [id number] , ADDRESS: [address] [place] [address] PCP Lanelle Bal, PA-C Patient Care Team: Lanelle Bal, PA-C as PCP - General (Family  Medicine) Satira Sark, MD as PCP - Cardiology (Cardiology)  This Provider for this visit: Treatment Team:  Attending Provider: Brand Males, MD    08/18/2021 -   Chief Complaint  Patient presents with   Follow-up    PFT performed today.  Pt states she has been doing okay since last visit. States she gets nauseous every time she takes her Esbriet and also will become SOB with exertion.    Hyperlipidemia  Nichole Reyes 71 y.o. -returns for follow-up.  At this visit she continues to be stable in terms of previous weight loss but also dyspnea on exertion although this time she tells me that the dyspnea is bothering her though the symptom score is stable.  Her pulmonary function test shows overall slow decline over a long period of time but most recently the 2 studies are stable.  She appears to get dyspneic while walking the dog.  She is looking for measures to help her.  Did indicate to her about deconditioning.  She has previously been to pulmonary rehabilitation.  Her schedule a does not allow her to go to rehab.  We talked about doing a 6-minute walk test to look for exertional desaturation and seeing if she would qualify for oxygen.  She is willing to do this.    The more important thing is that she is having significant nausea.  In fact the nausea is worse now.  All of a sudden for the last few months.  She started taking over-the-counter Emetrol a few days ago and this seems to help.  She told me that she is spacing her pirfenidone only every 4 hours.  This is what she thought she needed to do.  I did indicate to her that she needs to space it out at least 5 to 6 hours apart.  We will see if this helps the nausea.  She does drink enough fluids.  We went over her medication list.  This been significant changes to her anticholesterol medications.  Since earlier this year she has been on Repatha.  Then a few days ago or a few weeks ago her Zetia got changed back to fenofibrate.   The timing of this medication does not fit in with nausea.  Nevertheless she says she has hyperlipidemia for over 30 years.  Managed with primary care.  She says it is very difficult to control.  Never been seen by lipid specialist.  Willing to see Dr. Kenneth Mali Hilty.   Of note she has been interested in clinical trials but given the current nausea she wants to hold off at this point  CT Chest data   OV 02/28/2022  Subjective:  Patient ID: Nichole Reyes, female , DOB: 1950/06/30 , age 26 y.o. , MRN: [id number] , ADDRESS: [address] [place] [address] PCP Lanelle Bal, PA-C Patient Care Team: Lanelle Bal, PA-C as PCP - General (Family Medicine) Satira Sark, MD as PCP - Cardiology (Cardiology)  This Provider for this visit: Treatment Team:  Attending Provider: Brand Males, MD    02/28/2022 -   Chief Complaint  Patient presents with   Follow-up    PFT performed today. Pt states she has been doing okay since last visit.     Clinical IPF diagnosis given dec 2020 and reiterated Mid Feb 2021:  based on your age greater than 65, clubbing on exam, CT scan with high probability for this condition, negative history on your questionnaire and negative blood test on serology exams .  - Oon Esbriet since 05/22/2019 -    GERD/Hiatal Hernia  Last PFT  June 2023 Last CT scan of the chest March 2022   DJD/OA knee  Drug induced weihgt loss -due to pirfenidone  Grade 1 diastolic dysfunction with normal pulmonary artery pressure  -Last echo April 2022  History of COVID-19 Thanksgiving 2023 treated with outpatient Paxlovid  -Fatigue related syncope along with polypharmacy  Nichole Nichole Reyes 71 y.o. -returns for follow-up.  Since last seeing me in summer 2023 she is stable.  She tells me that Thanksgiving 2023 she did have outpatient COVID-19.  Treated with Paxlovid.  4 days into this she was extremely tired and then suddenly passed out.  Since then she has  been feeling well.  She felt it was because of dehydration and polypharmacy.  However from a respiratory standpoint and pirfenidone tolerance standpoint she is doing really well.  No changes in symptoms.  Pulmonary function test is actually stable since the last 1 year [results below].  She had a normal echocardiogram in the summer 2023.  Results reviewed.  Her acid reflux under control.  In the past we have discussed clinical trials as a care option.  Today discussed the potential for osteoporosis risk with pulmonary fibrosis patients.  This study going on by [employer].  It involves getting a DEXA bone scan and she is willing to participate if she qualifies.       SYMPTOM SCALE - ILD 05/06/2019 179# 12/16/2019 172# - esbriet + 05/06/2020 169# - esbriet 07/13/2020 167# - on esbreiet 01/24/2021 164# 08/18/2021 166# - esbriet 02/28/2022 162#  O2 use ra        Shortness of Breath 0 -> 5 scale with 5 being worst (score 6 If unable to do)        At rest 4 1 0 '2 3 1 1  '$ Simple tasks - showers, clothes change, eating, shaving '4 1 2 2 4 3 4  '$ Household (dishes, doing bed, laundry) 5  $'5 4 4 5 5 5  'a$ Shopping '5 3 4 3 4 5 5  '$ Walking level at own pace '5 3 4 3 4 5 5  '$ Walking up Stairs '5 5 5 4 5 5 5  '$ Total (30-36) Dyspnea Score '28 15 19 18 25 24 25  '$ How bad is your cough? 2.5 1 0 0 0 2 3  How bad is your fatigue  yes '4 3 4 3 4 4  '$ How bad is nausea x 0 2 0 '2 4 1  '$ How bad is vomiting?  x 0 0 0 0 0 0  How bad is diarrhea? x 0 0 0 0 0 0  How bad is anxiety? x 00 '1 2 3 2 4  '$ How bad is depression x 0 '3 2 2 3 4      '$ Simple office walk 185 feet x  3 laps goal with forehead probe 05/06/2019  12/16/2019  05/06/2020  07/13/2020  01/24/2021  08/18/2021   O2 used ra ra ra ra ra ra  Number laps completed '3 3 3 '$ goal - stopped at 2 due to dyspnea 3 and did all '3 3 3  '$ Comments about pace avg mod Slow pace avg    Resting Pulse Ox/HR 98% and 90/min 97% and 77/min 98% and 90/min 96% and 94 98% and 90 100% and 84   Final Pulse Ox/HR 96% and 100/min 97% and 91/mn 94% and 92 at 2nd lap 93% and 103/min 96% and 99 95% and 96  Desaturated </= 88% no no no no no   Desaturated <= 3% points no no Yes,, 4 pint Yes 3 points no   Got Tachycardic >/= 90/min yes yes yes yes    Symptoms at end of test Mod dyspnea, knee pain Dyspnea and fatigue strting at 1st lap and increased by 3rd stoppe early and did drop 3 points Dyspnea at end  Dyspnea mild to mod, light headed  Miscellaneous comments x         PFT     Latest Ref Rng & Units 11/29/2021   11:32 AM 08/18/2021    9:16 AM 01/24/2021   10:55 AM 07/13/2020    1:01 PM 08/26/2019    1:39 PM 11/19/2018    2:57 PM  PFT Results  FVC-Pre L 2.10  2.14  2.12  2.32  2.36  2.29   FVC-Predicted Pre % 65  66  66  72  72  70   FVC-Post L      2.30   FVC-Predicted Post %      70   Pre FEV1/FVC % % 91  90  89  89  88  89   Post FEV1/FCV % %      90   FEV1-Pre L 1.91  1.91  1.88  2.07  2.08  2.04   FEV1-Predicted Pre % 78  78  77  84  83  82   FEV1-Post L      2.07   DLCO uncorrected ml/min/mmHg 14.80  15.98  14.63  13.98  15.62  15.75   DLCO UNC% % 72  78  72  68  76  77   DLCO corrected ml/min/mmHg 14.80  15.98  14.63  13.98  15.62    DLCO COR %Predicted % 72  78  72  68  76    DLVA Predicted % 113  109  114  107  113  118   TLC L  3.44   TLC % Predicted %      66   RV % Predicted %      50        has a past medical history of Coronary artery calcification seen on CT scan, Essential hypertension, Fibromyalgia, Idiopathic pulmonary fibrosis (HCC), Mixed hyperlipidemia, Osteoporosis, and Type 2 diabetes mellitus ([place]).   reports that she has never smoked. She has been exposed to tobacco smoke. She has never used smokeless tobacco.  Past Surgical History:  Procedure Laterality Date   ABDOMINAL HYSTERECTOMY  2007   CATARACT EXTRACTION Bilateral     Allergies  Allergen Reactions   Latex    Pseudoephedrine Other (See Comments)    Rash and hallucinations     Sulfa Antibiotics    Sulfonamide Derivatives Rash    Immunization History  Administered Date(s) Administered   Fluad Quad(high Dose 65+) 01/29/2019, 12/16/2019, 01/24/2021   Influenza Split 12/18/2017   Influenza-Unspecified 11/18/2013   PFIZER(Purple Top)SARS-COV-2 Vaccination 05/15/2019, 06/13/2019   Tdap 05/04/1999    Family History  Problem Relation Age of Onset   Hypertension Mother    Diabetes Mother    Osteoporosis Mother    Hypertension Father    Heart disease Father    COPD Father    Colon cancer Sister    Cancer Brother    Heart disease Brother    Heart disease Brother    Emphysema Paternal Grandfather      Current Outpatient Medications:    ACCU-CHEK AVIVA PLUS test strip, , Disp: , Rfl:    Accu-Chek Softclix Lancets lancets, , Disp: , Rfl:    albuterol (VENTOLIN HFA) 108 (90 Base) MCG/ACT inhaler, 2 puffs every 4 (four) hours as needed. , Disp: , Rfl:    aspirin EC 81 MG tablet, Take 81 mg by mouth daily., Disp: , Rfl:    Calcium Carbonate (CALTRATE 600 PO), Take 1 tablet by mouth 2 (two) times a day., Disp: , Rfl:    clonazePAM (KLONOPIN) 0.5 MG tablet, Take 0.5 mg by mouth daily. , Disp: , Rfl:    ESBRIET 801 MG TABS, Take 801 mg by mouth 3 (three) times daily with meals., Disp: 270 tablet, Rfl: 0   fenofibrate (TRICOR) 48 MG tablet, Take 48 mg by mouth daily., Disp: , Rfl:    fluticasone (FLOVENT HFA) 110 MCG/ACT inhaler, INHALE 1 PUFF INTO THE LUNGS 2 TIMES A DAY., Disp: 12 g, Rfl: 3   fluticasone (FLOVENT HFA) 110 MCG/ACT inhaler, Inhale into the lungs 2 (two) times daily., Disp: , Rfl:    losartan (COZAAR) 50 MG tablet, Take 50 mg by mouth daily., Disp: , Rfl:    metFORMIN (GLUCOPHAGE-XR) 500 MG 24 hr tablet, Take 500 mg by mouth daily with breakfast. , Disp: , Rfl:    metoprolol succinate (TOPROL-XL) 50 MG 24 hr tablet, Take 50 mg by mouth daily. , Disp: , Rfl:    omeprazole (PRILOSEC) 20 MG capsule, TAKE 1 CAPSULE BY MOUTH ONCE DAILY., Disp: 30 capsule,  Rfl: 5   REPATHA SURECLICK 761 MG/ML SOAJ, Inject 1 mL into the skin every 14 (fourteen) days., Disp: , Rfl:    SAVELLA 50 MG TABS tablet, Take 50 mg by mouth 2 (two) times daily. , Disp: , Rfl:    tiZANidine (ZANAFLEX) 4 MG tablet, TAKE (1) TABLET EVERY NIGHT AT BEDTIME., Disp: 30 tablet, Rfl: 0   triamcinolone cream (KENALOG) 0.1 %, as needed., Disp: , Rfl:       Objective:  Vitals:   02/28/22 1322  BP: 124/68  Pulse: 88  Temp: 98.2 F (36.8 C)  TempSrc: Oral  SpO2: 93%  Weight: 162 lb 3.2 oz (73.6 kg)  Height: '5\' 5"'$  (1.651 m)    Estimated body mass index is 26.99 kg/m as calculated from the following:   Height as of this encounter: '5\' 5"'$  (1.651 m).   Weight as of this encounter: 162 lb 3.2 oz (73.6 kg).  '@WEIGHTCHANGE'$ @  [employer]   02/28/22 1322  Weight: 162 lb 3.2 oz (73.6 kg)     Physical Exam    General: No distress. Looks well Neuro: Alert and Oriented x 3. GCS 15. Speech normal Psych: Pleasant Resp:  Barrel Chest - no.  Wheeze - no, Crackles - maybe. Not sure, No overt respiratory distress CVS: Normal heart sounds. Murmurs - no Ext: Stigmata of Connective Tissue Disease - no HEENT: Normal upper airway. PEERL +. No post nasal drip        Assessment:       ICD-10-CM   1. IPF (idiopathic pulmonary fibrosis) (HCC)  F35.456 Pulmonary function test    2. Medication monitoring encounter  Z51.81     3. Gastroesophageal reflux disease, unspecified whether esophagitis present  K21.9     4. Vaccine counseling  Z71.85     5. DOE (dyspnea on exertion)  R06.09          Plan:     Patient Instructions  IPF (idiopathic pulmonary fibrosis) ([place]) Medication monitoring encounter Gastroesophageal reflux disease, unspecified whether esophagitis present Dyspnea on exertion Vaccine counseling  -IPF 7% worse sept 2020 -> Nov 2022 (-> stable through June 2023 -> dec 2023  -Nausea and treated with pirfenidone after adequate spacing  -Liver function  test normal with primary care physician November 2023  -Normal echocardiogram August 2023  Plan  -Continue acid reflux medications -Continue pirfenidone per schedule   -At this point in time benefit outweighs risk  -Continue space 5-6h between meals and apply sunscreen when going out -We discussed clinical trials as a care option and appreciate your interest  -meet LeighAnn of [facility] to enroll in bone  scan DEXA study if you qualif  -Do high-resolution CT chest supine and prone in March 2024  -Do repeat spirometry and DLCO in 3 -4 months  -Glad you are getting RSV and flu vaccine tomorrow on your own  Drug-induced weight loss  - continued weight loss slowly with pirfenidone and recent COVID  -At this point in time is still are technically overweight but you are getting closer to normal weight -Therefore we will leverage pirfenidone and helping you with your weight loss -We will continue to monitor -Do talk to primary care physician about other reasons for weight loss   Followup -Return to see  DR Chase Caller in 3-4 months - 30 min visit but after spiro/dlco amnd HRcT  High complex medical condition requiring high risk prescription with intensive therapeutic monitoring requirement   SIGNATURE    Dr. Brand Males, M.D., F.C.C.P,  Pulmonary and Critical Care Medicine Staff Physician, [place] Director - Interstitial Lung Disease  Program  [facility] [place], [place], [address]  Pager: [phone], If no answer or between  15:00h - 7:00h: call [phone] Telephone: [phone]  2:04 PM 02/28/2022

## 2022-02-28 NOTE — Progress Notes (Signed)
Spirometry and Dlco done today. 

## 2022-03-02 ENCOUNTER — Ambulatory Visit (HOSPITAL_BASED_OUTPATIENT_CLINIC_OR_DEPARTMENT_OTHER): Payer: Medicare Other

## 2022-03-02 ENCOUNTER — Other Ambulatory Visit: Payer: Self-pay | Admitting: *Deleted

## 2022-03-02 ENCOUNTER — Ambulatory Visit (HOSPITAL_BASED_OUTPATIENT_CLINIC_OR_DEPARTMENT_OTHER): Payer: Medicare Other | Admitting: Radiology

## 2022-03-02 MED ORDER — TIZANIDINE HCL 4 MG PO TABS: ORAL_TABLET | ORAL | 0 refills | Status: DC

## 2022-03-02 NOTE — Telephone Encounter (Signed)
Next Visit: 07/18/2022  Last Visit: 01/10/2022  Last Fill: 01/30/2022  Dx: Chronic pain of both hips   Current Dose per office note on 01/10/2022: not mentioned  Okay to refill Zanaflex?

## 2022-03-09 ENCOUNTER — Other Ambulatory Visit: Payer: Self-pay | Admitting: Pharmacist

## 2022-03-09 ENCOUNTER — Emergency Department (HOSPITAL_COMMUNITY): Payer: Medicare Other

## 2022-03-09 ENCOUNTER — Emergency Department (HOSPITAL_COMMUNITY)
Admission: EM | Admit: 2022-03-09 | Discharge: 2022-03-09 | Disposition: A | Discharge status: Home / Self Care | Payer: Medicare Other | Attending: Emergency Medicine | Admitting: Emergency Medicine

## 2022-03-09 DIAGNOSIS — Y93K1 Activity, walking an animal: Secondary | ICD-10-CM | POA: Diagnosis not present

## 2022-03-09 DIAGNOSIS — Z79899 Other long term (current) drug therapy: Secondary | ICD-10-CM | POA: Diagnosis not present

## 2022-03-09 DIAGNOSIS — Z7982 Long term (current) use of aspirin: Secondary | ICD-10-CM | POA: Insufficient documentation

## 2022-03-09 DIAGNOSIS — S42222A 2-part displaced fracture of surgical neck of left humerus, initial encounter for closed fracture: Secondary | ICD-10-CM | POA: Insufficient documentation

## 2022-03-09 DIAGNOSIS — W541XXA Struck by dog, initial encounter: Secondary | ICD-10-CM | POA: Diagnosis not present

## 2022-03-09 DIAGNOSIS — I10 Essential (primary) hypertension: Secondary | ICD-10-CM | POA: Insufficient documentation

## 2022-03-09 DIAGNOSIS — Z91040 Latex allergy status: Secondary | ICD-10-CM | POA: Diagnosis not present

## 2022-03-09 DIAGNOSIS — J84112 Idiopathic pulmonary fibrosis: Secondary | ICD-10-CM

## 2022-03-09 DIAGNOSIS — S42302A Unspecified fracture of shaft of humerus, left arm, initial encounter for closed fracture: Secondary | ICD-10-CM | POA: Diagnosis present

## 2022-03-09 MED ORDER — FENTANYL CITRATE PF 50 MCG/ML IJ SOSY
50.0000 ug | PREFILLED_SYRINGE | Freq: Once | INTRAMUSCULAR | Status: AC
  Administered 2022-03-09: 50 ug via INTRAMUSCULAR

## 2022-03-09 MED ORDER — HYDROMORPHONE HCL 1 MG/ML IJ SOLN
1.0000 mg | Freq: Once | INTRAMUSCULAR | Status: AC
  Administered 2022-03-09: 1 mg via INTRAMUSCULAR
  Filled 2022-03-09: qty 1

## 2022-03-09 MED ORDER — OXYCODONE-ACETAMINOPHEN 7.5-325 MG PO TABS: 1.0000 | ORAL_TABLET | ORAL | 0 refills | Status: DC | PRN

## 2022-03-09 MED ORDER — FENTANYL CITRATE PF 50 MCG/ML IJ SOSY
50.0000 ug | PREFILLED_SYRINGE | Freq: Once | INTRAMUSCULAR | Status: DC
  Filled 2022-03-09: qty 1

## 2022-03-09 MED ORDER — ESBRIET 801 MG PO TABS: 801.0000 mg | ORAL_TABLET | Freq: Three times a day (TID) | ORAL | 1 refills | Status: DC

## 2022-03-09 MED ORDER — OXYCODONE-ACETAMINOPHEN 5-325 MG PO TABS
1.0000 | ORAL_TABLET | Freq: Once | ORAL | Status: AC
  Administered 2022-03-09: 1 via ORAL
  Filled 2022-03-09: qty 1

## 2022-03-09 NOTE — ED Provider Notes (Signed)
[facility] EMERGENCY DEPARTMENT Provider Note   CSN: [id number] Arrival date & time: 03/09/22  1231     History  Chief Complaint  Patient presents with   Nichole Reyes is a 71 y.o. female with history of hyperlipidemia, depression, migraines, fibromyalgia, hypertension, pulmonary fibrosis who presents to the emergency department complaining of left shoulder pain after a fall. Patient states she was walking her dog and the dog pulled forward on the leash, causing her to fall directly onto her left shoulder. EMS placed patient in [place] sling and swathe. No head trauma or LOC. Not on blood thinners.    Fall       Home Medications Prior to Admission medications   Medication Sig Start Date End Date Taking? Authorizing Provider  albuterol (VENTOLIN HFA) 108 (90 Base) MCG/ACT inhaler 2 puffs every 4 (four) hours as needed.  02/27/19  Yes [provider]  aspirin EC 81 MG tablet Take 81 mg by mouth daily.   Yes [provider]  Calcium Carbonate (CALTRATE 600 PO) Take 1 tablet by mouth 2 (two) times a day.   Yes [provider]  clonazePAM (KLONOPIN) 0.5 MG tablet Take 0.5 mg by mouth daily.  06/15/16  Yes [provider]  cyanocobalamin (VITAMIN B12) 1000 MCG tablet Take 1,000 mcg by mouth daily.   Yes [provider]  ESBRIET 801 MG TABS Take 801 mg by mouth 3 (three) times daily with meals. 03/09/22  Yes Brand Males, MD  fenofibrate (TRICOR) 48 MG tablet Take 48 mg by mouth daily. 06/02/16  Yes [provider]  fluticasone (FLOVENT HFA) 110 MCG/ACT inhaler INHALE 1 PUFF INTO THE LUNGS 2 TIMES A DAY. Patient taking differently: Inhale 1 puff into the lungs 2 (two) times daily. 07/15/21  Yes Brand Males, MD  losartan (COZAAR) 50 MG tablet Take 50 mg by mouth daily. 09/23/20  Yes [provider]  metFORMIN (GLUCOPHAGE-XR) 500 MG 24 hr tablet Take 500 mg by mouth daily with breakfast.  08/30/17  Yes  [provider]  metoprolol succinate (TOPROL-XL) 50 MG 24 hr tablet Take 50 mg by mouth daily.  06/15/16  Yes [provider]  omeprazole (PRILOSEC) 20 MG capsule TAKE 1 CAPSULE BY MOUTH ONCE DAILY. 02/22/22  Yes Brand Males, MD  oxyCODONE-acetaminophen (PERCOCET) 7.5-325 MG tablet Take 1 tablet by mouth every 4 (four) hours as needed for severe pain. 03/09/22  Yes Tamella Tuccillo T, PA-C  REPATHA SURECLICK 350 MG/ML SOAJ Inject 1 mL into the skin every 14 (fourteen) days. 04/25/21  Yes [provider]  SAVELLA 50 MG TABS tablet Take 50 mg by mouth 2 (two) times daily.  06/25/16  Yes [provider]  tiZANidine (ZANAFLEX) 4 MG tablet TAKE (1) TABLET EVERY NIGHT AT BEDTIME. 03/02/22  Yes Ofilia Neas, PA-C  ACCU-CHEK AVIVA PLUS test strip  07/17/18   [provider]  Accu-Chek Softclix Lancets lancets  05/07/18   [provider]  fluticasone (FLOVENT HFA) 110 MCG/ACT inhaler Inhale into the lungs 2 (two) times daily. Patient not taking: Reported on 03/09/2022    [provider]      Allergies    Latex, Pseudoephedrine, Sulfa antibiotics, and Sulfonamide derivatives    Review of Systems   Review of Systems  Musculoskeletal:  Positive for arthralgias.  All other systems reviewed and are negative.   Physical Exam Updated Vital Signs BP (!) 164/92 (BP Location: Right Leg)   Pulse 79  Temp 97.7 F (36.5 C) (Oral)   Resp 16   SpO2 95%  Physical Exam Vitals and nursing note reviewed.  Constitutional:      Appearance: Normal appearance.  HENT:     Head: Normocephalic and atraumatic.  Eyes:     Conjunctiva/sclera: Conjunctivae normal.  Cardiovascular:     Pulses:          Radial pulses are 2+ on the right side and 2+ on the left side.  Pulmonary:     Effort: Pulmonary effort is normal. No respiratory distress.  Musculoskeletal:     Comments: Significant tenderness to L shoulder joint. Bony deformity with anterior  protrusion. Limited ROM due to pain. Normal grip strength in hand.   Skin:    General: Skin is warm and dry.  Neurological:     Mental Status: She is alert.  Psychiatric:        Mood and Affect: Mood normal.        Behavior: Behavior normal.     ED Results / Procedures / Treatments   Labs (all labs ordered are listed, but only abnormal results are displayed) Labs Reviewed - No data to display  EKG None  Radiology CT Shoulder Left Wo Contrast  Result Date: 03/09/2022 CLINICAL DATA:  Fall. EXAM: CT OF THE UPPER LEFT EXTREMITY WITHOUT CONTRAST TECHNIQUE: Multidetector CT imaging of the upper left extremity was performed according to the standard protocol. RADIATION DOSE REDUCTION: This exam was performed according to the departmental dose-optimization program which includes automated exposure control, adjustment of the mA and/or kV according to patient size and/or use of iterative reconstruction technique. COMPARISON:  Left shoulder x-rays from same day. FINDINGS: Bones/Joint/Cartilage Acute oblique fracture of the proximal humerus surgical neck with 1.2 cm anterior displacement and 1.7 cm of overriding. Nondisplaced longitudinal component extending into the greater tuberosity. No involvement of the lesser tuberosity. No additional fracture. No dislocation. Joint spaces are preserved. No significant joint effusion. Ligaments Ligaments are suboptimally evaluated by CT. Muscles and Tendons Grossly intact.  No muscle atrophy. Soft tissue Soft tissue swelling around the fracture extending into the axilla. No fluid collection or hematoma. No soft tissue mass. IMPRESSION: 1. Acute displaced and impacted fracture of proximal humerus surgical neck, with nondisplaced extension into the greater tuberosity. Electronically Signed   By: Titus Dubin M.D.   On: 03/09/2022 17:02   DG Shoulder Left  Result Date: 03/09/2022 CLINICAL DATA:  Trauma, fall, pain EXAM: LEFT SHOULDER - 2+ VIEW COMPARISON:  None  Available. FINDINGS: Study is technically less than optimal In the AP supine image, there is angulation in the neck of the proximal left humerus. There is no definite demonstrable fracture line. There is decreased distance between humeral head and acromion. In the lateral view, proximal humerus is not visualized for evaluation. IMPRESSION: Technically limited examination. There is angulation in the neck of the left humerus. The study is inconclusive to evaluate for fracture or dislocation. Repeat radiographic examination or CT may be considered for further evaluation. Electronically Signed   By: Elmer Picker M.D.   On: 03/09/2022 13:48    Procedures Procedures    Medications Ordered in ED Medications  oxyCODONE-acetaminophen (PERCOCET/ROXICET) 5-325 MG per tablet 1 tablet (1 tablet Oral Given 03/09/22 1351)  fentaNYL (SUBLIMAZE) injection 50 mcg (50 mcg Intramuscular Given 03/09/22 1655)  HYDROmorphone (DILAUDID) injection 1 mg (1 mg Intramuscular Given 03/09/22 1846)    ED Course/ Medical Decision Making/ A&P  Medical Decision Making Amount and/or Complexity of Data Reviewed Radiology: ordered.  Risk Prescription drug management.  This patient is a 71 y.o. female  who presents to the ED for concern of left shoulder pain after fall.   Differential diagnoses prior to evaluation: The emergent differential diagnosis includes, but is not limited to,  fracture, dislocation, ligamentous injury, muscle strain. This is not an exhaustive differential.   Past Medical History / Co-morbidities: hyperlipidemia, depression, migraines, fibromyalgia, hypertension, pulmonary fibrosis  Physical Exam: Physical exam performed. The pertinent findings include: significant tenderness and deformity noted to L shoulder. Neurovascularly intact in BUE.   Lab Tests/Imaging studies: I personally interpreted labs/imaging and the pertinent results include:  x-ray and CT of left  shoulder. X-ray imaging inconclusive, CT imaging with oblique fracture of proximal humeral neck with anterior displacement. I agree with the radiologist interpretation.  Consultations obtained: I consulted with on call orthopedic surgeon Dr Aline Brochure who recommended placing patient in an immobilizer, providing pain control, and calling his office tomorrow to schedule follow up and surgery.   Medications: I ordered medication including pain medication.  I have reviewed the patients home medicines and have made adjustments as needed.   Disposition: After consideration of the diagnostic results and the patients response to treatment, I feel that emergency department workup does not suggest an emergent condition requiring admission or immediate intervention beyond what has been performed at this time. The plan is: discharge to home with shoulder immobilizer and pain control. The patient is safe for discharge and has been instructed to return immediately for worsening symptoms, change in symptoms or any other concerns.  Final Clinical Impression(s) / ED Diagnoses Final diagnoses:  Closed 2-part displaced fracture of surgical neck of left humerus, initial encounter    Rx / DC Orders ED Discharge Orders          Ordered    oxyCODONE-acetaminophen (PERCOCET) 7.5-325 MG tablet  Every 4 hours PRN        03/09/22 1904           Portions of this report may have been transcribed using voice recognition software. Every effort was made to ensure accuracy; however, inadvertent computerized transcription errors may be present.    Estill Cotta 03/09/22 2059    Isla Pence, MD 03/09/22 2325

## 2022-03-09 NOTE — ED Notes (Signed)
Sweatshirt removed and pt placed in gown

## 2022-03-09 NOTE — Telephone Encounter (Signed)
Refill sent for ESBRIET to [facility] ([facility]) for Esbriet: [phone]  Dose: 801 mg three times daily  Last OV: 02/28/2022 Provider: Dr. Chase Caller  Next OV: 3-4 months (not yet scheduled)  Labs Nov 2023 wnl  Knox Saliva, PharmD, MPH, BCPS Clinical Pharmacist (Rheumatology and Pulmonology)

## 2022-03-09 NOTE — Discharge Instructions (Addendum)
You were seen in the emergency department for shoulder pain after a fall.  As we discussed, your broke your upper humerus. You will need to follow up with the orthopedic surgeon Dr Aline Brochure by calling his office first thing tomorrow morning. I've attached his information below.   In the mean time, you are to wear the shoulder immobilizer and I've written you a prescription for pain medication.   Continue to monitor how you're doing and return to the ER for new or worsening symptoms.

## 2022-03-09 NOTE — ED Triage Notes (Signed)
BIB [facility]- pt was walking her dog and he jerked her, causing her to eventually fall. C/o left arm/shoulder pain. Arrives with triangle bandage sling and swath to left arm

## 2022-03-10 ENCOUNTER — Encounter: Payer: Self-pay | Admitting: Orthopedic Surgery

## 2022-03-10 ENCOUNTER — Ambulatory Visit: Payer: Medicare Other | Admitting: Orthopedic Surgery

## 2022-03-10 VITALS — BP 150/73 | HR 90 | Ht 65.0 in | Wt 160.0 lb

## 2022-03-10 DIAGNOSIS — S42295A Other nondisplaced fracture of upper end of left humerus, initial encounter for closed fracture: Secondary | ICD-10-CM | POA: Diagnosis not present

## 2022-03-10 MED ORDER — PROMETHAZINE HCL 12.5 MG PO TABS: 12.5000 mg | ORAL_TABLET | Freq: Four times a day (QID) | ORAL | 0 refills | Status: DC | PRN

## 2022-03-10 MED ORDER — OXYCODONE HCL 10 MG PO TABS: 10.0000 mg | ORAL_TABLET | ORAL | 0 refills | Status: AC | PRN

## 2022-03-10 NOTE — Patient Instructions (Signed)
Take the oxycodone 10 mg as directed and   Tylenol 500 mg every 6 hrs   Wear the sling al the time

## 2022-03-10 NOTE — Progress Notes (Signed)
Chief Complaint  Patient presents with   Arm Injury    Left arm pain, s/p fall while walking the dog on side of the road.  Proximal left humerus fracture, in sling.  Taking oxycodone q 4 hr with relief.    HPI: 71 year old female with coronary artery calcification seen on CT scan, essential hypertension fibromyalgia idiopathic pulmonary fibrosis type 2 diabetes presents with left proximal humerus pain after falling while walking her dog.  She is also nauseous  Her pain is not controlled with oxycodone 7.5/325 Tylenol.  She cannot take ibuprofen.  She was told not to.  Past Medical History:  Diagnosis Date   Coronary artery calcification seen on CT scan    Essential hypertension    Fibromyalgia    Idiopathic pulmonary fibrosis (HCC)    Mixed hyperlipidemia    Osteoporosis    Type 2 diabetes mellitus (HCC)     BP (!) 150/73   Pulse 90   Ht '5\' 5"'$  (1.651 m)   Wt 160 lb (72.6 kg)   BMI 26.63 kg/m    General appearance: Well-developed well-nourished no gross deformities  Cardiovascular normal pulse and perfusion normal color without edema  Neurologically no sensation loss or deficits or pathologic reflexes  Psychological: Awake alert and oriented x3 mood and affect normal  Skin no lacerations or ulcerations no nodularity no palpable masses, no erythema or nodularity  Musculoskeletal: The patient did walk in but she seemed very nauseous when we checked her shoulder.  She did have normal sensation over the lateral deltoid and distally she had normal hand function and motion.  She was tender over the proximal humerus with swelling.  Imaging CT scan and plain films were obtained.  I will give my interpretation.  1.  The proximal humerus fracture is noted on the plain film the lateral x-ray the fracture fragments cannot be seen or adequately imaged  2.  CT scan does show that the head and neck are in alignment on the AP with slight instillation of maybe 1 to 2 mm  on the lateral  view she is apex anterior angulation approximately 87 degrees.  On both images the head and neck fragments are intact  A/P  71 year old female with multiple medical problems as listed above including idiopathic pulmonary fibrosis and diabetes presents with a proximal humerus fracture which for the most part meets the near criteria for nonoperative treatment  I did discuss both options with the patient and recommended that this be treated with nonoperative treatment for 3 weeks in a sling followed by 8 weeks of physical therapy with the corollary of losing some motion on the left side  I changed her pain medication from oxycodone 7.5 325 to oxycodone 10 with 500 mg Tylenol supplemented in between and I gave her some Phenergan as she became nauseous in the office  Meds ordered this encounter  Medications   Oxycodone HCl 10 MG TABS    Sig: Take 1 tablet (10 mg total) by mouth every 4 (four) hours as needed for up to 5 days.    Dispense:  30 tablet    Refill:  0   promethazine (PHENERGAN) 12.5 MG tablet    Sig: Take 1 tablet (12.5 mg total) by mouth every 6 (six) hours as needed for nausea or vomiting.    Dispense:  30 tablet    Refill:  0   Xrays in 2 weeks

## 2022-03-14 ENCOUNTER — Telehealth: Payer: Self-pay | Admitting: Pharmacist

## 2022-03-14 NOTE — Telephone Encounter (Signed)
Received fax from [facility] to reverify bnefits and insurance information for patient. Spoke with patient. States she broke her shoulder and has bee nunable to call [facility] back.  Completed form over phone with patient today and faxed back to [facility]  Fax: [phone] Phone: [phone] Patient ID: [id number]  Knox Saliva, PharmD, MPH, BCPS, CPP Clinical Pharmacist (Rheumatology and Pulmonology)

## 2022-03-16 ENCOUNTER — Telehealth: Payer: Self-pay | Admitting: Orthopedic Surgery

## 2022-03-16 NOTE — Telephone Encounter (Signed)
Left message for her to call back

## 2022-03-16 NOTE — Telephone Encounter (Signed)
Patient called, lvm stating she saw Dr. Aline Brochure last week and her arm is in a sling.  She would like to speak w/someone, she has some questions about the sling.  Pt's # [phone]

## 2022-03-17 ENCOUNTER — Inpatient Hospital Stay (HOSPITAL_COMMUNITY)
Admission: EM | Admit: 2022-03-17 | Discharge: 2022-03-20 | DRG: 175 | Disposition: A | Discharge status: Home / Self Care | Payer: Medicare Other | Attending: Family Medicine | Admitting: Family Medicine

## 2022-03-17 ENCOUNTER — Other Ambulatory Visit: Payer: Self-pay

## 2022-03-17 ENCOUNTER — Encounter (HOSPITAL_COMMUNITY): Payer: Self-pay

## 2022-03-17 ENCOUNTER — Emergency Department (HOSPITAL_COMMUNITY): Payer: Medicare Other

## 2022-03-17 DIAGNOSIS — Z7982 Long term (current) use of aspirin: Secondary | ICD-10-CM

## 2022-03-17 DIAGNOSIS — G4739 Other sleep apnea: Secondary | ICD-10-CM | POA: Diagnosis present

## 2022-03-17 DIAGNOSIS — Z7951 Long term (current) use of inhaled steroids: Secondary | ICD-10-CM

## 2022-03-17 DIAGNOSIS — J449 Chronic obstructive pulmonary disease, unspecified: Secondary | ICD-10-CM | POA: Diagnosis present

## 2022-03-17 DIAGNOSIS — E785 Hyperlipidemia, unspecified: Secondary | ICD-10-CM | POA: Diagnosis present

## 2022-03-17 DIAGNOSIS — J9601 Acute respiratory failure with hypoxia: Secondary | ICD-10-CM | POA: Diagnosis present

## 2022-03-17 DIAGNOSIS — J84112 Idiopathic pulmonary fibrosis: Secondary | ICD-10-CM | POA: Diagnosis present

## 2022-03-17 DIAGNOSIS — Z7984 Long term (current) use of oral hypoglycemic drugs: Secondary | ICD-10-CM | POA: Diagnosis not present

## 2022-03-17 DIAGNOSIS — G4730 Sleep apnea, unspecified: Secondary | ICD-10-CM | POA: Diagnosis present

## 2022-03-17 DIAGNOSIS — M797 Fibromyalgia: Secondary | ICD-10-CM | POA: Diagnosis present

## 2022-03-17 DIAGNOSIS — K219 Gastro-esophageal reflux disease without esophagitis: Secondary | ICD-10-CM | POA: Diagnosis present

## 2022-03-17 DIAGNOSIS — Z91040 Latex allergy status: Secondary | ICD-10-CM | POA: Diagnosis not present

## 2022-03-17 DIAGNOSIS — S42301A Unspecified fracture of shaft of humerus, right arm, initial encounter for closed fracture: Secondary | ICD-10-CM | POA: Diagnosis present

## 2022-03-17 DIAGNOSIS — Z8249 Family history of ischemic heart disease and other diseases of the circulatory system: Secondary | ICD-10-CM | POA: Diagnosis not present

## 2022-03-17 DIAGNOSIS — J849 Interstitial pulmonary disease, unspecified: Secondary | ICD-10-CM | POA: Diagnosis present

## 2022-03-17 DIAGNOSIS — Z888 Allergy status to other drugs, medicaments and biological substances status: Secondary | ICD-10-CM

## 2022-03-17 DIAGNOSIS — W1830XA Fall on same level, unspecified, initial encounter: Secondary | ICD-10-CM | POA: Diagnosis present

## 2022-03-17 DIAGNOSIS — E119 Type 2 diabetes mellitus without complications: Secondary | ICD-10-CM | POA: Diagnosis present

## 2022-03-17 DIAGNOSIS — Z23 Encounter for immunization: Secondary | ICD-10-CM

## 2022-03-17 DIAGNOSIS — E782 Mixed hyperlipidemia: Secondary | ICD-10-CM | POA: Diagnosis present

## 2022-03-17 DIAGNOSIS — I2609 Other pulmonary embolism with acute cor pulmonale: Principal | ICD-10-CM | POA: Diagnosis present

## 2022-03-17 DIAGNOSIS — Z79899 Other long term (current) drug therapy: Secondary | ICD-10-CM | POA: Diagnosis not present

## 2022-03-17 DIAGNOSIS — I10 Essential (primary) hypertension: Secondary | ICD-10-CM

## 2022-03-17 DIAGNOSIS — Z90710 Acquired absence of both cervix and uterus: Secondary | ICD-10-CM | POA: Diagnosis not present

## 2022-03-17 DIAGNOSIS — Z8616 Personal history of COVID-19: Secondary | ICD-10-CM | POA: Diagnosis not present

## 2022-03-17 DIAGNOSIS — Z882 Allergy status to sulfonamides status: Secondary | ICD-10-CM

## 2022-03-17 DIAGNOSIS — I2699 Other pulmonary embolism without acute cor pulmonale: Secondary | ICD-10-CM | POA: Diagnosis not present

## 2022-03-17 DIAGNOSIS — E1100 Type 2 diabetes mellitus with hyperosmolarity without nonketotic hyperglycemic-hyperosmolar coma (NKHHC): Secondary | ICD-10-CM | POA: Diagnosis not present

## 2022-03-17 DIAGNOSIS — I2510 Atherosclerotic heart disease of native coronary artery without angina pectoris: Secondary | ICD-10-CM | POA: Diagnosis present

## 2022-03-17 DIAGNOSIS — M810 Age-related osteoporosis without current pathological fracture: Secondary | ICD-10-CM | POA: Diagnosis present

## 2022-03-17 LAB — URINALYSIS, ROUTINE W REFLEX MICROSCOPIC
Bacteria, UA: NONE SEEN
Bilirubin Urine: NEGATIVE
Glucose, UA: NEGATIVE mg/dL
Ketones, ur: NEGATIVE mg/dL
Leukocytes,Ua: NEGATIVE
Nitrite: NEGATIVE
Protein, ur: NEGATIVE mg/dL
Specific Gravity, Urine: 1.01 (ref 1.005–1.030)
pH: 6 (ref 5.0–8.0)

## 2022-03-17 LAB — BASIC METABOLIC PANEL
Anion gap: 9 (ref 5–15)
BUN: 11 mg/dL (ref 8–23)
CO2: 26 mmol/L (ref 22–32)
Calcium: 9.6 mg/dL (ref 8.9–10.3)
Chloride: 104 mmol/L (ref 98–111)
Creatinine, Ser: 0.66 mg/dL (ref 0.44–1.00)
GFR, Estimated: >60 mL/min (ref >60)
Glucose, Bld: 133 mg/dL — ABNORMAL HIGH (ref 70–99)
Potassium: 3.4 mmol/L — ABNORMAL LOW (ref 3.5–5.1)
Sodium: 139 mmol/L (ref 135–145)

## 2022-03-17 LAB — CBC
HCT: 38.9 % (ref 36.0–46.0)
Hemoglobin: 12.7 g/dL (ref 12.0–15.0)
MCH: 31.9 pg (ref 26.0–34.0)
MCHC: 32.6 g/dL (ref 30.0–36.0)
MCV: 97.7 fL (ref 80.0–100.0)
Platelets: 357 10*3/uL (ref 150–400)
RBC: 3.98 MIL/uL (ref 3.87–5.11)
RDW: 13.2 % (ref 11.5–15.5)
WBC: 14.8 10*3/uL — ABNORMAL HIGH (ref 4.0–10.5)
nRBC: 0 % (ref 0.0–0.2)

## 2022-03-17 LAB — APTT: aPTT: 28 seconds (ref 24–36)

## 2022-03-17 LAB — RESP PANEL BY RT-PCR (RSV, FLU A&B, COVID)  RVPGX2
Influenza A by PCR: NEGATIVE
Influenza B by PCR: NEGATIVE
Resp Syncytial Virus by PCR: NEGATIVE
SARS Coronavirus 2 by RT PCR: POSITIVE — AB

## 2022-03-17 LAB — BRAIN NATRIURETIC PEPTIDE: B Natriuretic Peptide: 60 pg/mL (ref 0.0–100.0)

## 2022-03-17 LAB — PROTIME-INR
INR: 1 (ref 0.8–1.2)
Prothrombin Time: 12.9 seconds (ref 11.4–15.2)

## 2022-03-17 LAB — TROPONIN I (HIGH SENSITIVITY)
Troponin I (High Sensitivity): 158 ng/L (ref <18)
Troponin I (High Sensitivity): 151 ng/L (ref <18)

## 2022-03-17 MED ORDER — IPRATROPIUM-ALBUTEROL 0.5-2.5 (3) MG/3ML IN SOLN
3.0000 mL | Freq: Once | RESPIRATORY_TRACT | Status: AC
  Administered 2022-03-17: 3 mL via RESPIRATORY_TRACT
  Filled 2022-03-17: qty 3

## 2022-03-17 MED ORDER — IOHEXOL 350 MG/ML SOLN
75.0000 mL | Freq: Once | INTRAVENOUS | Status: AC | PRN
  Administered 2022-03-17: 75 mL via INTRAVENOUS

## 2022-03-17 MED ORDER — METHYLPREDNISOLONE SODIUM SUCC 125 MG IJ SOLR
80.0000 mg | Freq: Once | INTRAMUSCULAR | Status: AC
  Administered 2022-03-17: 80 mg via INTRAVENOUS
  Filled 2022-03-17: qty 2

## 2022-03-17 MED ORDER — HEPARIN (PORCINE) 25000 UT/250ML-% IV SOLN
1300.0000 [IU]/h | INTRAVENOUS | Status: DC
  Administered 2022-03-17 – 2022-03-18 (×2): 1200 [IU]/h via INTRAVENOUS
  Administered 2022-03-19: 1300 [IU]/h via INTRAVENOUS
  Filled 2022-03-17 (×4): qty 250

## 2022-03-17 MED ORDER — HEPARIN BOLUS VIA INFUSION
4000.0000 [IU] | Freq: Once | INTRAVENOUS | Status: AC
  Administered 2022-03-17: 4000 [IU] via INTRAVENOUS

## 2022-03-17 MED ORDER — HEPARIN BOLUS VIA INFUSION: 4500.0000 [IU] | Freq: Once | INTRAVENOUS | Status: DC

## 2022-03-17 NOTE — ED Triage Notes (Signed)
Pt reports she woke up with chest tightness and feeling like she "can't get enough air".  Pt reports she tried to eat to see if that helped as it has helped with these symptoms in the past but it did not.

## 2022-03-17 NOTE — Progress Notes (Signed)
03/17/2022 Case reviewed with EDP. Class III intermediate risk PE. Exaggerated RV response due to chronic lung disease.  - BR x 24h - Get echo, BNP, trops - DO not think will need advanced intervention based on initial review of data unless echo extremely compromised or clot in transit  Available PRN  Erskine Emery MD PCCM

## 2022-03-17 NOTE — ED Provider Notes (Cosign Needed)
[facility] EMERGENCY DEPARTMENT Provider Note   CSN: [id number] Arrival date & time: 03/17/22  1359     History  Chief Complaint  Patient presents with   Chest Pain    KASHAY CAVENAUGH is a 71 y.o. female.   Chest Pain Associated symptoms: shortness of breath   Associated symptoms: no abdominal pain, no back pain, no fever, no headache, no nausea, no vomiting and no weakness        JOLAINE FRYBERGER is a 71 y.o. female with past medical history of pulmonary fibrosis, hypertension, fibromyalgia, type 2 diabetes who presents to the Emergency Department complaining of sudden onset of shortness of breath this morning upon waking.  She describes having a tightness across her upper chest.  States that she is having labored breathing and does not feel like she is taking in enough air.  She was seen recently after mechanical fall in which she suffered a fracture of her left humerus.  She has been taking 10 mg oxycodone for her pain.  She took her last pain pill last evening has not taken any today.  She denies any recent fever, chills, cough or nasal congestion.  No known sick contacts.  She has used her maintenance medications as directed. Diagnosed with COVID in mid November, she feels that she had recovered from it.    Home Medications Prior to Admission medications   Medication Sig Start Date End Date Taking? Authorizing Provider  ACCU-CHEK AVIVA PLUS test strip  07/17/18   [provider]  Accu-Chek Softclix Lancets lancets  05/07/18   [provider]  albuterol (VENTOLIN HFA) 108 (90 Base) MCG/ACT inhaler 2 puffs every 4 (four) hours as needed.  02/27/19   [provider]  aspirin EC 81 MG tablet Take 81 mg by mouth daily.    [provider]  Calcium Carbonate (CALTRATE 600 PO) Take 1 tablet by mouth 2 (two) times a day.    [provider]  clonazePAM (KLONOPIN) 0.5 MG tablet Take 0.5 mg by mouth daily.  06/15/16   [provider]  cyanocobalamin (VITAMIN B12) 1000 MCG tablet Take 1,000 mcg by mouth daily.    [provider]  ESBRIET 801 MG TABS Take 801 mg by mouth 3 (three) times daily with meals. 03/09/22   Brand Males, MD  fenofibrate (TRICOR) 48 MG tablet Take 48 mg by mouth daily. 06/02/16   [provider]  fluticasone (FLOVENT HFA) 110 MCG/ACT inhaler INHALE 1 PUFF INTO THE LUNGS 2 TIMES A DAY. Patient taking differently: Inhale 1 puff into the lungs 2 (two) times daily. 07/15/21   Brand Males, MD  fluticasone (FLOVENT HFA) 110 MCG/ACT inhaler Inhale into the lungs 2 (two) times daily.    [provider]  losartan (COZAAR) 50 MG tablet Take 50 mg by mouth daily. 09/23/20   [provider]  metFORMIN (GLUCOPHAGE-XR) 500 MG 24 hr tablet Take 500 mg by mouth daily with breakfast.  08/30/17   [provider]  metoprolol succinate (TOPROL-XL) 50 MG 24 hr tablet Take 50 mg by mouth daily.  06/15/16   [provider]  omeprazole (PRILOSEC) 20 MG capsule TAKE 1 CAPSULE BY MOUTH ONCE DAILY. 02/22/22   Brand Males, MD  oxyCODONE-acetaminophen (PERCOCET) 7.5-325 MG tablet Take 1 tablet by mouth every 4 (four) hours as needed for severe pain. 03/09/22   Roemhildt, Lorin T, PA-C  promethazine (PHENERGAN) 12.5 MG tablet Take 1 tablet (12.5 mg total) by mouth every 6 (  six) hours as needed for nausea or vomiting. 03/10/22   Carole Civil, MD  REPATHA SURECLICK 470 MG/ML SOAJ Inject 1 mL into the skin every 14 (fourteen) days. 04/25/21   [provider]  SAVELLA 50 MG TABS tablet Take 50 mg by mouth 2 (two) times daily.  06/25/16   [provider]  tiZANidine (ZANAFLEX) 4 MG tablet TAKE (1) TABLET EVERY NIGHT AT BEDTIME. 03/02/22   Ofilia Neas, PA-C      Allergies    Latex, Pseudoephedrine, Sulfa antibiotics, and Sulfonamide derivatives    Review of Systems   Review of Systems  Constitutional:  Negative for appetite change, chills and  fever.  HENT:  Negative for congestion and sore throat.   Respiratory:  Positive for chest tightness and shortness of breath.   Cardiovascular:  Positive for chest pain. Negative for leg swelling.  Gastrointestinal:  Negative for abdominal pain, nausea and vomiting.  Genitourinary:  Negative for dysuria and flank pain.  Musculoskeletal:  Negative for arthralgias and back pain.  Skin:  Negative for rash.  Neurological:  Negative for syncope, weakness and headaches.    Physical Exam Updated Vital Signs BP 135/64 (BP Location: Right Arm)   Pulse (!) 107   Temp 98.2 F (36.8 C) (Oral)   Resp 20   Ht '5\' 5"'$  (1.651 m)   Wt 72.6 kg   SpO2 (!) 83%   BMI 26.63 kg/m  Physical Exam Vitals and nursing note reviewed.  Constitutional:      General: She is not in acute distress.    Appearance: Normal appearance. She is not toxic-appearing.  HENT:     Nose: No rhinorrhea.     Mouth/Throat:     Mouth: Mucous membranes are moist.     Pharynx: Oropharynx is clear.  Eyes:     Conjunctiva/sclera: Conjunctivae normal.  Cardiovascular:     Rate and Rhythm: Normal rate and regular rhythm.     Pulses: Normal pulses.  Pulmonary:     Effort: Respiratory distress present.     Breath sounds: No wheezing or rales.     Comments: Patient with labored breathing, lung sounds are diminished bilaterally. Abdominal:     General: There is no distension.     Palpations: Abdomen is soft.     Tenderness: There is no abdominal tenderness.  Musculoskeletal:     Cervical back: Normal range of motion.     Right lower leg: No edema.     Left lower leg: No edema.     Comments: Patient has left arm in a sling.  There is old appearing bruising along the upper arm.  Skin:    General: Skin is warm.     Capillary Refill: Capillary refill takes less than 2 seconds.  Neurological:     General: No focal deficit present.     Mental Status: She is alert.     Sensory: No sensory deficit.     Motor: No weakness.      ED Results / Procedures / Treatments   Labs (all labs ordered are listed, but only abnormal results are displayed) Labs Reviewed  RESP PANEL BY RT-PCR (RSV, FLU A&B, COVID)  RVPGX2 - Abnormal; Notable for the following components:      Result Value   SARS Coronavirus 2 by RT PCR POSITIVE (*)    All other components within normal limits  BASIC METABOLIC PANEL - Abnormal; Notable for the following components:   Potassium 3.4 (*)  Glucose, Bld 133 (*)    All other components within normal limits  CBC - Abnormal; Notable for the following components:   WBC 14.8 (*)    All other components within normal limits  URINALYSIS, ROUTINE W REFLEX MICROSCOPIC - Abnormal; Notable for the following components:   Hgb urine dipstick SMALL (*)    All other components within normal limits  TROPONIN I (HIGH SENSITIVITY) - Abnormal; Notable for the following components:   Troponin I (High Sensitivity) 158 (*)    All other components within normal limits  TROPONIN I (HIGH SENSITIVITY) - Abnormal; Notable for the following components:   Troponin I (High Sensitivity) 151 (*)    All other components within normal limits  BRAIN NATRIURETIC PEPTIDE  APTT  PROTIME-INR    EKG EKG Interpretation  Date/Time:  Friday March 17 2022 14:06:05 EST Ventricular Rate:  107 PR Interval:  144 QRS Duration: 98 QT Interval:  362 QTC Calculation: 483 R Axis:   -25 Text Interpretation: Sinus tachycardia Left ventricular hypertrophy with repolarization abnormality ( R in aVL , Cornell product , Romhilt-Estes ) Abnormal ECG No previous ECGs available Confirmed by Garnette Gunner [id number]) on 03/17/2022 2:12:08 PM  Radiology CT Angio Chest PE W and/or Wo Contrast  Addendum Date: 03/17/2022   ADDENDUM REPORT: 03/17/2022 17:37 ADDENDUM: These results were called by telephone at the time of interpretation on 03/17/2022 at 5:37 pm to provider Roderic Palau, MD, who verbally acknowledged these results. Electronically  Signed   By: Fidela Salisbury M.D.   On: 03/17/2022 17:37   Result Date: 03/17/2022 CLINICAL DATA:  Pulmonary embolism (PE) suspected, high prob. COVID pneumonia, chest pain EXAM: CT ANGIOGRAPHY CHEST WITH CONTRAST TECHNIQUE: Multidetector CT imaging of the chest was performed using the standard protocol during bolus administration of intravenous contrast. Multiplanar CT image reconstructions and MIPs were obtained to evaluate the vascular anatomy. RADIATION DOSE REDUCTION: This exam was performed according to the departmental dose-optimization program which includes automated exposure control, adjustment of the mA and/or kV according to patient size and/or use of iterative reconstruction technique. CONTRAST:  68m OMNIPAQUE IOHEXOL 350 MG/ML SOLN COMPARISON:  05/31/2020 FINDINGS: Cardiovascular: Multiple branching intraluminal filling defects are seen within the lobar and segmental pulmonary arteries bilaterally in keeping with acute pulmonary embolism. The overall embolic burden is moderate. The central pulmonary arteries are of normal caliber. However, there is dilation of the right ventricle with reversal of the normal RV/LV ratio which is approximally 1.47. Global cardiac size is within normal limits. Moderate coronary artery calcification. No pericardial effusion. Mild atherosclerotic calcification within the thoracic aorta. No aortic aneurysm. Mediastinum/Nodes: Single borderline enlarged infrahilar lymph node measures 15 mm in short axis diameter, minimally enlarged since prior examination where this measured 12 mm. This is nonspecific. No other pathologic adenopathy within the thorax. The visualized thyroid is unremarkable. The esophagus is unremarkable. Lungs/Pleura: Subpleural pulmonary nodule within the right middle lobe, axial image # 80/6, measuring 8 mm in mean diameter demonstrates minimal enlargement when compared to prior examination. Subpleural nodule within the right lower lobe, axial image #  64/6, measuring 13 mm in mean diameter is stable since prior examination. Subpleural pulmonary fibrosis demonstrating a basilar gradient is again identified and appears stable since prior examination. No pneumothorax or pleural effusion. No central obstructing lesion. Upper Abdomen: No acute abnormality. Musculoskeletal: Acute fracture of the left humeral head is seen with fracture planes involving the greater tuberosity and surgical neck of the humerus is again seen, better seen on CT examination  of 03/09/2022. No other fracture identified. Osseous structures are otherwise age-appropriate. Review of the MIP images confirms the above findings. IMPRESSION: 1. Acute pulmonary embolism with moderate embolic burden. Positive for acute PE with CT evidence of right heart strain (RV/LV Ratio = 1.47 ) consistent with at least submassive (intermediate risk) PE. The presence of right heart strain has been associated with an increased risk of morbidity and mortality. Please refer to the "Code PE Focused" order set in EPIC. 2. Moderate coronary artery calcification. 3. Stable subpleural pulmonary fibrosis demonstrating a basilar gradient. 4. Acute fracture of the left humeral head with fracture planes involving the greater tuberosity and surgical neck of the humerus, better seen on CT examination of 03/09/2022. 5. Multiple pulmonary nodules. Evaluation is slightly limited due to motion artifact and I suspect these nodules are likely stable though repeat evaluation with nonemergent CT examination of the chest is recommended once the patient's acute issues have resolved. Aortic Atherosclerosis (ICD10-I70.0). Electronically Signed: By: Fidela Salisbury M.D. On: 03/17/2022 17:32   DG Chest 2 View  Result Date: 03/17/2022 CLINICAL DATA:  Chest pain and shortness of breath EXAM: CHEST - 2 VIEW COMPARISON:  CT 05/31/2020. FINDINGS: Lateral view degraded by patient arm position. Patient rotated to the right on the frontal, which is  reverse apical lordotic in positioning. Mild cardiomegaly. Atherosclerosis in the transverse aorta. Mild right hemidiaphragm elevation. No pleural effusion or pneumothorax. Peripheral and basilar predominant interstitial thickening likely related to the clinical history of interstitial lung disease detailed on the prior CT. This is greater right than left. No evidence of superimposed lobar consolidation or congestive failure. IMPRESSION: Asymmetric peripheral and basilar predominant interstitial thickening is likely related to the interstitial lung disease detailed on 05/31/2020 CT. No convincing evidence of acute superimposed process. Given asymmetry, a subtle or early right lower lobe pneumonia cannot be entirely excluded. Cardiomegaly without congestive failure. Aortic Atherosclerosis (ICD10-I70.0). Electronically Signed   By: Abigail Miyamoto M.D.   On: 03/17/2022 14:36    Procedures .Critical Care  Performed by: Kem Parkinson, PA-C Authorized by: Kem Parkinson, PA-C   Critical care provider statement:    Critical care time (minutes):  35   Critical care was necessary to treat or prevent imminent or life-threatening deterioration of the following conditions:  Respiratory failure, circulatory failure and cardiac failure   Critical care was time spent personally by me on the following activities:  Discussions with consultants, examination of patient, ordering and performing treatments and interventions, ordering and review of laboratory studies, ordering and review of radiographic studies, pulse oximetry and re-evaluation of patient's condition     Medications Ordered in ED Medications  ipratropium-albuterol (DUONEB) 0.5-2.5 (3) MG/3ML nebulizer solution 3 mL (has no administration in time range)  methylPREDNISolone sodium succinate (SOLU-MEDROL) 125 mg/2 mL injection 80 mg (has no administration in time range)    ED Course/ Medical Decision Making/ A&P                           Medical  Decision Making On arrival, patient noted to be hypoxic with O2 sat of 83% on room air.  She was placed on cardiac monitor and O2 sat 88 to 89% on room air.  She was placed on 2 L oxygen by nasal cannula O2 sat now 91 to 92%.  Patient here with history of pulmonary fibrosis, sudden onset of tightness of her upper chest and shortness of breath upon waking this morning.  No  history of cough or fever.  No prior ACS.  On my exam, patient's breathing appears labored, lung sounds are diminished bilaterally.  Abdomen is soft and nontender.  Differential would include but not limited to pneumonia, exacerbation of her pulmonary fibrosis, ACS, PE. PE high on the differential at this time, does not endorse fever or significant cough that would make me suspect pneumonia but also considered.  Will need labs, likely CTA and may require hospital admission.  Currently her O2 sat has improved with 2 L    Amount and/or Complexity of Data Reviewed Labs: ordered.    Details: Labs interpreted by me, leukocytosis with white count of 14,800 hemoglobin reassuring.  Chemistries without derangement.  BNP 60.  Initial troponin 158, delta troponin 151.  Respiratory panel shows positive for COVID.  Patient had positive home COVID test in late November. Radiology: ordered.    Details: Chest x-ray shows asymmetric peripheral basilar predominant interstitial thickening likely related to interstitial lung disease.  Right lower lobe pneumonia cannot be excluded.  CT angio of the chest ordered for further evaluation of patient's symptoms and concern for PE.  Contacted by radiologist with confirmation of acute PE with right heart strain. ECG/medicine tests: ordered.    Details: EKG shows sinus tachycardia with left ventricular hypertrophy Discussion of management or test interpretation with external provider(s): On recheck after nebulizer treatment and IV steroids, patient states breathing somewhat improved.  She is currently on  2 L nasal cannula and sats are in the low 90s  Radiology called with CT angio results, patient has PE with right heart strain.  Will consult with critical care IV heparin ordered per pharmacy consult  Discussed findings with intensivist, Dr. Tamala Julian who recommends patient have echocardiogram, heparin.  If echocardiogram not available here at [facility] for weekend patient will need hospitalist admission and transfer to [facility]  Discussed findings with [facility] hospitalist, Dr. Nehemiah Settle.  Patient to be admitted here, felt that echocardiogram is available for tomorrow, will admit here  Risk Prescription drug management.           Final Clinical Impression(s) / ED Diagnoses Final diagnoses:  Acute pulmonary embolism, unspecified pulmonary embolism type, unspecified whether acute cor pulmonale present [facility])    Rx / DC Orders ED Discharge Orders     None         Kem Parkinson, PA-C 03/17/22 2055

## 2022-03-17 NOTE — Progress Notes (Signed)
ANTICOAGULATION CONSULT NOTE - Initial Consult  Pharmacy Consult for heparin Indication: pulmonary embolus  Allergies  Allergen Reactions   Latex    Pseudoephedrine Other (See Comments)    Rash and hallucinations    Sulfa Antibiotics    Sulfonamide Derivatives Rash    Patient Measurements: Height: '5\' 5"'$  (165.1 cm) Weight: 72.6 kg (160 lb) IBW/kg (Calculated) : 57 Heparin Dosing Weight: 71.7 kg  Vital Signs: Temp: 98.2 F (36.8 C) (12/29 1403) Temp Source: Oral (12/29 1403) BP: 143/67 (12/29 1630) Pulse Rate: 97 (12/29 1630)  Labs: Recent Labs    03/17/22 1504 03/17/22 1510 03/17/22 1637  HGB  --  12.7  --   HCT  --  38.9  --   PLT  --  357  --   APTT 28  --   --   LABPROT 12.9  --   --   INR 1.0  --   --   CREATININE  --  0.66  --   TROPONINIHS  --  158* 151*    Estimated Creatinine Clearance: 66.2 mL/min (by C-G formula based on SCr of 0.66 mg/dL).   Medical History: Past Medical History:  Diagnosis Date   Coronary artery calcification seen on CT scan    Essential hypertension    Fibromyalgia    Idiopathic pulmonary fibrosis (HCC)    Mixed hyperlipidemia    Osteoporosis    Type 2 diabetes mellitus (HCC)     Medications:  (Not in a hospital admission)  Scheduled:   heparin  4,000 Units Intravenous Once   Infusions:   heparin 1,200 Units/hr (03/17/22 1836)    Assessment: Pt admitted with chest tightness and feeling like she "can't get enough air". Pt has pulmonary fibrosis, diabetes, HTN, and fibromyalgia with proximal left humerus fracture from fall last week. Pt only on aspirin 81 mg daily, no oral anticoagulant on admission.  Goal of Therapy:  Heparin level 0.3-0.7 units/ml Monitor platelets by anticoagulation protocol: Yes   Plan:  Give 4000  units bolus x 1 Heparin 1200 units/hr.  Heparin level in 6 hours.  Blenda Nicely 03/17/2022,6:37 PM

## 2022-03-17 NOTE — Telephone Encounter (Signed)
Discussed shower with patient, advised shower chair sling on getting in and out of the shower and be careful not to fall or have something that will make her slip getting in and out She voiced understanding.

## 2022-03-17 NOTE — H&P (Signed)
History and Physical    Patient: Nichole Reyes EHM:[id number] DOB: [birth year] DOA: 03/17/2022 DOS: the patient was seen and examined on 03/17/2022 PCP: Lanelle Bal, PA-C  Patient coming from: Home  Chief Complaint:  Chief Complaint  Patient presents with   Chest Pain   HPI: Nichole Reyes is a 71 y.o. female with medical history significant of coronary artery disease, essential hypertension, diabetes type 2, hyperlipidemia.  Patient seen for chest pain and shortness of breath that started this morning.  Patient was feeling very short of breath while moving around.  Denies fevers, chills, nausea, vomiting.  Review of Systems: As mentioned in the history of present illness. All other systems reviewed and are negative. Past Medical History:  Diagnosis Date   Coronary artery calcification seen on CT scan    Essential hypertension    Fibromyalgia    Idiopathic pulmonary fibrosis ([place])    Mixed hyperlipidemia    Osteoporosis    Type 2 diabetes mellitus ([place])    Past Surgical History:  Procedure Laterality Date   ABDOMINAL HYSTERECTOMY  2007   CATARACT EXTRACTION Bilateral    Social History:  reports that she has never smoked. She has been exposed to tobacco smoke. She has never used smokeless tobacco. She reports current alcohol use. She reports that she does not use drugs.  Allergies  Allergen Reactions   Latex    Pseudoephedrine Other (See Comments)    Rash and hallucinations    Sulfa Antibiotics    Sulfonamide Derivatives Rash    Family History  Problem Relation Age of Onset   Hypertension Mother    Diabetes Mother    Osteoporosis Mother    Hypertension Father    Heart disease Father    COPD Father    Colon cancer Sister    Cancer Brother    Heart disease Brother    Heart disease Brother    Emphysema Paternal Grandfather     Prior to Admission medications   Medication Sig Start Date End Date Taking? Authorizing Provider  albuterol (VENTOLIN HFA) 108  (90 Base) MCG/ACT inhaler Inhale 2 puffs into the lungs every 4 (four) hours as needed for wheezing or shortness of breath. 02/27/19  Yes [provider]  aspirin EC 81 MG tablet Take 81 mg by mouth daily.   Yes [provider]  Calcium Carbonate (CALTRATE 600 PO) Take 1 tablet by mouth 2 (two) times a day.   Yes [provider]  clonazePAM (KLONOPIN) 0.5 MG tablet Take 0.5 mg by mouth daily.  06/15/16  Yes [provider]  cyanocobalamin (VITAMIN B12) 1000 MCG tablet Take 1,000 mcg by mouth daily.   Yes [provider]  ESBRIET 801 MG TABS Take 801 mg by mouth 3 (three) times daily with meals. 03/09/22  Yes Brand Males, MD  fenofibrate (TRICOR) 48 MG tablet Take 48 mg by mouth daily. 06/02/16  Yes [provider]  fluticasone (FLOVENT HFA) 110 MCG/ACT inhaler INHALE 1 PUFF INTO THE LUNGS 2 TIMES A DAY. Patient taking differently: Inhale 1 puff into the lungs 2 (two) times daily. 07/15/21  Yes Brand Males, MD  losartan (COZAAR) 50 MG tablet Take 50 mg by mouth daily. 09/23/20  Yes [provider]  metFORMIN (GLUCOPHAGE-XR) 500 MG 24 hr tablet Take 500 mg by mouth daily with breakfast.  08/30/17  Yes [provider]  metoprolol succinate (TOPROL-XL) 50 MG 24 hr tablet Take 50 mg by mouth daily.  06/15/16  Yes [provider]  omeprazole (PRILOSEC) 20 MG capsule TAKE 1 CAPSULE BY MOUTH ONCE DAILY. Patient taking differently: Take 20 mg by mouth daily. 02/22/22  Yes Brand Males, MD  Oxycodone HCl 10 MG TABS Take 10 mg by mouth daily as needed (severe pain).   Yes [provider]  promethazine (PHENERGAN) 12.5 MG tablet Take 1 tablet (12.5 mg total) by mouth every 6 (six) hours as needed for nausea or vomiting. 03/10/22  Yes Carole Civil, MD  REPATHA SURECLICK 175 MG/ML SOAJ Inject 1 mL into the skin every 14 (fourteen) days. 04/25/21  Yes [provider]  SAVELLA 50 MG TABS tablet Take 50  mg by mouth 2 (two) times daily.  06/25/16  Yes [provider]  tiZANidine (ZANAFLEX) 4 MG tablet TAKE (1) TABLET EVERY NIGHT AT BEDTIME. Patient taking differently: Take 4 mg by mouth at bedtime. TAKE (1) TABLET EVERY NIGHT AT BEDTIME. 03/02/22  Yes Ofilia Neas, PA-C  ACCU-CHEK AVIVA PLUS test strip  07/17/18   [provider]  Accu-Chek Softclix Lancets lancets  05/07/18   [provider]  oxyCODONE-acetaminophen (PERCOCET) 7.5-325 MG tablet Take 1 tablet by mouth every 4 (four) hours as needed for severe pain. Patient not taking: Reported on 03/17/2022 03/09/22   Estill Cotta    Physical Exam: Vitals:   03/17/22 1600 03/17/22 1630 03/17/22 1738 03/17/22 1833  BP: (!) 152/74 (!) 143/67  (!) 153/79  Pulse: 96 97  100  Resp: (!) 22 (!) 28  20  Temp:    98.2 F (36.8 C)  TempSrc:    Oral  SpO2: 92% 93% (!) 80% 93%  Weight:      Height:       General: Elderly female. Awake and alert and oriented x3. No acute cardiopulmonary distress.  HEENT: Normocephalic atraumatic.  Right and left ears normal in appearance.  Pupils equal, round, reactive to light. Extraocular muscles are intact. Sclerae anicteric and noninjected.  Moist mucosal membranes. No mucosal lesions.  Neck: Neck supple without lymphadenopathy. No carotid bruits. No masses palpated.  Cardiovascular: Regular rate with normal S1-S2 sounds. No murmurs, rubs, gallops auscultated. No JVD.  Respiratory: Good respiratory effort with no wheezes, rales, rhonchi. Lungs clear to auscultation bilaterally.  No accessory muscle use. Abdomen: Soft, nontender, nondistended. Active bowel sounds. No masses or hepatosplenomegaly  Skin: No rashes, lesions, or ulcerations.  Dry, warm to touch. 2+ dorsalis pedis and radial pulses. Musculoskeletal: No calf or leg pain. All major joints not erythematous nontender.  No upper or lower joint deformation.  Good ROM.  No contractures  Psychiatric: Intact judgment and  insight. Pleasant and cooperative. Neurologic: No focal neurological deficits. Strength is 5/5 and symmetric in upper and lower extremities.  Cranial nerves II through XII are grossly intact.  Data Reviewed: Results for orders placed or performed during the hospital encounter of 03/17/22 (from the past 24 hour(s))  Resp panel by RT-PCR (RSV, Flu A&B, Covid) Anterior Nasal Swab     Status: Abnormal   Collection Time: 03/17/22  2:05 PM   Specimen: Anterior Nasal Swab  Result Value Ref Range   SARS Coronavirus 2 by RT PCR POSITIVE (A) NEGATIVE   Influenza A by PCR NEGATIVE NEGATIVE   Influenza B by PCR NEGATIVE NEGATIVE   Resp Syncytial Virus by PCR NEGATIVE NEGATIVE  Urinalysis, Routine w reflex microscopic Urine, Clean Catch     Status: Abnormal   Collection Time: 03/17/22  2:49 PM  Result Value Ref Range  Color, Urine YELLOW YELLOW   APPearance CLEAR CLEAR   Specific Gravity, Urine 1.010 1.005 - 1.030   pH 6.0 5.0 - 8.0   Glucose, UA NEGATIVE NEGATIVE mg/dL   Hgb urine dipstick SMALL (A) NEGATIVE   Bilirubin Urine NEGATIVE NEGATIVE   Ketones, ur NEGATIVE NEGATIVE mg/dL   Protein, ur NEGATIVE NEGATIVE mg/dL   Nitrite NEGATIVE NEGATIVE   Leukocytes,Ua NEGATIVE NEGATIVE   RBC / HPF 0-5 0 - 5 RBC/hpf   WBC, UA 0-5 0 - 5 WBC/hpf   Bacteria, UA NONE SEEN NONE SEEN   Squamous Epithelial / LPF 0-5 0 - 5 /HPF   Mucus PRESENT    Hyaline Casts, UA PRESENT   APTT     Status: None   Collection Time: 03/17/22  3:04 PM  Result Value Ref Range   aPTT 28 24 - 36 seconds  Protime-INR     Status: None   Collection Time: 03/17/22  3:04 PM  Result Value Ref Range   Prothrombin Time 12.9 11.4 - 15.2 seconds   INR 1.0 0.8 - 1.2  Basic metabolic panel     Status: Abnormal   Collection Time: 03/17/22  3:10 PM  Result Value Ref Range   Sodium 139 135 - 145 mmol/L   Potassium 3.4 (L) 3.5 - 5.1 mmol/L   Chloride 104 98 - 111 mmol/L   CO2 26 22 - 32 mmol/L   Glucose, Bld 133 (H) 70 - 99 mg/dL    BUN 11 8 - 23 mg/dL   Creatinine, Ser 0.66 0.44 - 1.00 mg/dL   Calcium 9.6 8.9 - 10.3 mg/dL   GFR, Estimated >60 >60 mL/min   Anion gap 9 5 - 15  CBC     Status: Abnormal   Collection Time: 03/17/22  3:10 PM  Result Value Ref Range   WBC 14.8 (H) 4.0 - 10.5 K/uL   RBC 3.98 3.87 - 5.11 MIL/uL   Hemoglobin 12.7 12.0 - 15.0 g/dL   HCT 38.9 36.0 - 46.0 %   MCV 97.7 80.0 - 100.0 fL   MCH 31.9 26.0 - 34.0 pg   MCHC 32.6 30.0 - 36.0 g/dL   RDW 13.2 11.5 - 15.5 %   Platelets 357 150 - 400 K/uL   nRBC 0.0 0.0 - 0.2 %  Troponin I (High Sensitivity)     Status: Abnormal   Collection Time: 03/17/22  3:10 PM  Result Value Ref Range   Troponin I (High Sensitivity) 158 (HH) <18 ng/L  Troponin I (High Sensitivity)     Status: Abnormal   Collection Time: 03/17/22  4:37 PM  Result Value Ref Range   Troponin I (High Sensitivity) 151 (HH) <18 ng/L  Brain natriuretic peptide     Status: None   Collection Time: 03/17/22  4:37 PM  Result Value Ref Range   B Natriuretic Peptide 60.0 0.0 - 100.0 pg/mL   CT Angio Chest PE W and/or Wo Contrast  Addendum Date: 03/17/2022   ADDENDUM REPORT: 03/17/2022 17:37 ADDENDUM: These results were called by telephone at the time of interpretation on 03/17/2022 at 5:37 pm to provider Roderic Palau, MD, who verbally acknowledged these results. Electronically Signed   By: Fidela Salisbury M.D.   On: 03/17/2022 17:37   Result Date: 03/17/2022 CLINICAL DATA:  Pulmonary embolism (PE) suspected, high prob. COVID pneumonia, chest pain EXAM: CT ANGIOGRAPHY CHEST WITH CONTRAST TECHNIQUE: Multidetector CT imaging of the chest was performed using the standard protocol during bolus administration of intravenous contrast. Multiplanar  CT image reconstructions and MIPs were obtained to evaluate the vascular anatomy. RADIATION DOSE REDUCTION: This exam was performed according to the departmental dose-optimization program which includes automated exposure control, adjustment of the mA and/or kV  according to patient size and/or use of iterative reconstruction technique. CONTRAST:  99m OMNIPAQUE IOHEXOL 350 MG/ML SOLN COMPARISON:  05/31/2020 FINDINGS: Cardiovascular: Multiple branching intraluminal filling defects are seen within the lobar and segmental pulmonary arteries bilaterally in keeping with acute pulmonary embolism. The overall embolic burden is moderate. The central pulmonary arteries are of normal caliber. However, there is dilation of the right ventricle with reversal of the normal RV/LV ratio which is approximally 1.47. Global cardiac size is within normal limits. Moderate coronary artery calcification. No pericardial effusion. Mild atherosclerotic calcification within the thoracic aorta. No aortic aneurysm. Mediastinum/Nodes: Single borderline enlarged infrahilar lymph node measures 15 mm in short axis diameter, minimally enlarged since prior examination where this measured 12 mm. This is nonspecific. No other pathologic adenopathy within the thorax. The visualized thyroid is unremarkable. The esophagus is unremarkable. Lungs/Pleura: Subpleural pulmonary nodule within the right middle lobe, axial image # 80/6, measuring 8 mm in mean diameter demonstrates minimal enlargement when compared to prior examination. Subpleural nodule within the right lower lobe, axial image # 64/6, measuring 13 mm in mean diameter is stable since prior examination. Subpleural pulmonary fibrosis demonstrating a basilar gradient is again identified and appears stable since prior examination. No pneumothorax or pleural effusion. No central obstructing lesion. Upper Abdomen: No acute abnormality. Musculoskeletal: Acute fracture of the left humeral head is seen with fracture planes involving the greater tuberosity and surgical neck of the humerus is again seen, better seen on CT examination of 03/09/2022. No other fracture identified. Osseous structures are otherwise age-appropriate. Review of the MIP images confirms the  above findings. IMPRESSION: 1. Acute pulmonary embolism with moderate embolic burden. Positive for acute PE with CT evidence of right heart strain (RV/LV Ratio = 1.47 ) consistent with at least submassive (intermediate risk) PE. The presence of right heart strain has been associated with an increased risk of morbidity and mortality. Please refer to the "Code PE Focused" order set in EPIC. 2. Moderate coronary artery calcification. 3. Stable subpleural pulmonary fibrosis demonstrating a basilar gradient. 4. Acute fracture of the left humeral head with fracture planes involving the greater tuberosity and surgical neck of the humerus, better seen on CT examination of 03/09/2022. 5. Multiple pulmonary nodules. Evaluation is slightly limited due to motion artifact and I suspect these nodules are likely stable though repeat evaluation with nonemergent CT examination of the chest is recommended once the patient's acute issues have resolved. Aortic Atherosclerosis (ICD10-I70.0). Electronically Signed: By: AFidela SalisburyM.D. On: 03/17/2022 17:32   DG Chest 2 View  Result Date: 03/17/2022 CLINICAL DATA:  Chest pain and shortness of breath EXAM: CHEST - 2 VIEW COMPARISON:  CT 05/31/2020. FINDINGS: Lateral view degraded by patient arm position. Patient rotated to the right on the frontal, which is reverse apical lordotic in positioning. Mild cardiomegaly. Atherosclerosis in the transverse aorta. Mild right hemidiaphragm elevation. No pleural effusion or pneumothorax. Peripheral and basilar predominant interstitial thickening likely related to the clinical history of interstitial lung disease detailed on the prior CT. This is greater right than left. No evidence of superimposed lobar consolidation or congestive failure. IMPRESSION: Asymmetric peripheral and basilar predominant interstitial thickening is likely related to the interstitial lung disease detailed on 05/31/2020 CT. No convincing evidence of acute superimposed  process. Given asymmetry,  a subtle or early right lower lobe pneumonia cannot be entirely excluded. Cardiomegaly without congestive failure. Aortic Atherosclerosis (ICD10-I70.0). Electronically Signed   By: Abigail Miyamoto M.D.   On: 03/17/2022 14:36     Assessment and Plan: No notes have been filed under this hospital service. Service: Hospitalist  Principal Problem:   Acute pulmonary embolism (HCC) Active Problems:   Hyperlipidemia   Other sleep apnea   ILD (interstitial lung disease) (HCC)   Type 2 diabetes mellitus ([place])   Essential hypertension   Coronary artery calcification seen on CT scan  Acute pulmonary embolism Heparin Echocardiogram in the morning Repeat CBC and CMP Interstitial lung disease Continue Flovent Continue Esbriet Type 2 diabetes Hold metformin Sliding scale insulin to cover Hypertension Continue antihypertensives Hyperlipidemia   Advance Care Planning:   Code Status: Full Code full code confirmed by patient  Consults: None  Family Communication: Husband present during interview and exam  Severity of Illness: The appropriate patient status for this patient is INPATIENT. Inpatient status is judged to be reasonable and necessary in order to provide the required intensity of service to ensure the patient's safety. The patient's presenting symptoms, physical exam findings, and initial radiographic and laboratory data in the context of their chronic comorbidities is felt to place them at high risk for further clinical deterioration. Furthermore, it is not anticipated that the patient will be medically stable for discharge from the hospital within 2 midnights of admission.   * I certify that at the point of admission it is my clinical judgment that the patient will require inpatient hospital care spanning beyond 2 midnights from the point of admission due to high intensity of service, high risk for further deterioration and high frequency of surveillance  required.*  Author: Truett Mainland, DO 03/17/2022 8:09 PM  For on call review [email or web address].

## 2022-03-18 DIAGNOSIS — E1100 Type 2 diabetes mellitus with hyperosmolarity without nonketotic hyperglycemic-hyperosmolar coma (NKHHC): Secondary | ICD-10-CM | POA: Diagnosis not present

## 2022-03-18 DIAGNOSIS — I10 Essential (primary) hypertension: Secondary | ICD-10-CM | POA: Diagnosis not present

## 2022-03-18 DIAGNOSIS — I2699 Other pulmonary embolism without acute cor pulmonale: Secondary | ICD-10-CM | POA: Diagnosis not present

## 2022-03-18 LAB — BASIC METABOLIC PANEL
Anion gap: 9 (ref 5–15)
BUN: 12 mg/dL (ref 8–23)
CO2: 23 mmol/L (ref 22–32)
Calcium: 9.5 mg/dL (ref 8.9–10.3)
Chloride: 107 mmol/L (ref 98–111)
Creatinine, Ser: 0.56 mg/dL (ref 0.44–1.00)
GFR, Estimated: >60 mL/min (ref >60)
Glucose, Bld: 153 mg/dL — ABNORMAL HIGH (ref 70–99)
Potassium: 3.5 mmol/L (ref 3.5–5.1)
Sodium: 139 mmol/L (ref 135–145)

## 2022-03-18 LAB — CBC
HCT: 35.7 % — ABNORMAL LOW (ref 36.0–46.0)
Hemoglobin: 11.5 g/dL — ABNORMAL LOW (ref 12.0–15.0)
MCH: 31.3 pg (ref 26.0–34.0)
MCHC: 32.2 g/dL (ref 30.0–36.0)
MCV: 97 fL (ref 80.0–100.0)
Platelets: 386 10*3/uL (ref 150–400)
RBC: 3.68 MIL/uL — ABNORMAL LOW (ref 3.87–5.11)
RDW: 13.4 % (ref 11.5–15.5)
WBC: 15.1 10*3/uL — ABNORMAL HIGH (ref 4.0–10.5)
nRBC: 0 % (ref 0.0–0.2)

## 2022-03-18 LAB — HEPARIN LEVEL (UNFRACTIONATED)
Heparin Unfractionated: 0.41 IU/mL (ref 0.30–0.70)
Heparin Unfractionated: 0.32 IU/mL (ref 0.30–0.70)

## 2022-03-18 LAB — CBG MONITORING, ED
Glucose-Capillary: 163 mg/dL — ABNORMAL HIGH (ref 70–99)
Glucose-Capillary: 121 mg/dL — ABNORMAL HIGH (ref 70–99)
Glucose-Capillary: 149 mg/dL — ABNORMAL HIGH (ref 70–99)

## 2022-03-18 LAB — GLUCOSE, CAPILLARY
Glucose-Capillary: 100 mg/dL — ABNORMAL HIGH (ref 70–99)
Glucose-Capillary: 105 mg/dL — ABNORMAL HIGH (ref 70–99)

## 2022-03-18 LAB — HIV ANTIBODY (ROUTINE TESTING W REFLEX): HIV Screen 4th Generation wRfx: NONREACTIVE

## 2022-03-18 LAB — HEMOGLOBIN A1C
Hgb A1c MFr Bld: 6 % — ABNORMAL HIGH (ref 4.8–5.6)
Mean Plasma Glucose: 125.5 mg/dL

## 2022-03-18 MED ORDER — OXYCODONE HCL 5 MG PO TABS: 2.5000 mg | ORAL_TABLET | ORAL | Status: DC | PRN

## 2022-03-18 MED ORDER — PNEUMOCOCCAL 20-VAL CONJ VACC 0.5 ML IM SUSY
0.5000 mL | PREFILLED_SYRINGE | INTRAMUSCULAR | Status: DC
  Administered 2022-03-20: 0.5 mL via INTRAMUSCULAR

## 2022-03-18 MED ORDER — PANTOPRAZOLE SODIUM 40 MG PO TBEC
40.0000 mg | DELAYED_RELEASE_TABLET | Freq: Every day | ORAL | Status: DC
  Administered 2022-03-18 – 2022-03-20 (×3): 40 mg via ORAL
  Filled 2022-03-18 (×3): qty 1

## 2022-03-18 MED ORDER — OXYCODONE-ACETAMINOPHEN 5-325 MG PO TABS: 1.0000 | ORAL_TABLET | ORAL | Status: DC | PRN

## 2022-03-18 MED ORDER — FLUTICASONE PROPIONATE HFA 110 MCG/ACT IN AERO: 1.0000 | INHALATION_SPRAY | Freq: Two times a day (BID) | RESPIRATORY_TRACT | Status: DC

## 2022-03-18 MED ORDER — BUDESONIDE 0.25 MG/2ML IN SUSP
0.2500 mg | Freq: Two times a day (BID) | RESPIRATORY_TRACT | Status: DC
  Administered 2022-03-18 – 2022-03-20 (×5): 0.25 mg via RESPIRATORY_TRACT
  Filled 2022-03-18 (×5): qty 2

## 2022-03-18 MED ORDER — METOPROLOL SUCCINATE ER 50 MG PO TB24
50.0000 mg | ORAL_TABLET | Freq: Every day | ORAL | Status: DC
  Administered 2022-03-18 – 2022-03-20 (×3): 50 mg via ORAL
  Filled 2022-03-18 (×3): qty 1

## 2022-03-18 MED ORDER — OXYCODONE HCL 5 MG PO TABS
10.0000 mg | ORAL_TABLET | Freq: Every day | ORAL | Status: DC | PRN
  Administered 2022-03-18 – 2022-03-19 (×3): 10 mg via ORAL
  Filled 2022-03-18 (×3): qty 2

## 2022-03-18 MED ORDER — CHLORHEXIDINE GLUCONATE CLOTH 2 % EX PADS
6.0000 | MEDICATED_PAD | Freq: Every day | CUTANEOUS | Status: DC
  Administered 2022-03-18 – 2022-03-20 (×3): 6 via TOPICAL

## 2022-03-18 MED ORDER — TIZANIDINE HCL 2 MG PO TABS
4.0000 mg | ORAL_TABLET | Freq: Every day | ORAL | Status: DC
  Administered 2022-03-18 – 2022-03-19 (×2): 4 mg via ORAL
  Filled 2022-03-18 (×2): qty 2

## 2022-03-18 MED ORDER — FENOFIBRATE 54 MG PO TABS
54.0000 mg | ORAL_TABLET | Freq: Every day | ORAL | Status: DC
  Administered 2022-03-18: 54 mg via ORAL
  Filled 2022-03-18: qty 1

## 2022-03-18 MED ORDER — LOSARTAN POTASSIUM 50 MG PO TABS
50.0000 mg | ORAL_TABLET | Freq: Every day | ORAL | Status: DC
  Administered 2022-03-18 – 2022-03-20 (×3): 50 mg via ORAL
  Filled 2022-03-18: qty 2
  Filled 2022-03-18 (×2): qty 1

## 2022-03-18 MED ORDER — CLONAZEPAM 0.5 MG PO TABS
0.5000 mg | ORAL_TABLET | Freq: Every day | ORAL | Status: DC
  Administered 2022-03-18: 0.5 mg via ORAL
  Filled 2022-03-18: qty 1

## 2022-03-18 MED ORDER — FENOFIBRATE 54 MG PO TABS
54.0000 mg | ORAL_TABLET | Freq: Every day | ORAL | Status: DC
  Administered 2022-03-19: 54 mg via ORAL
  Filled 2022-03-18 (×3): qty 1

## 2022-03-18 MED ORDER — CLONAZEPAM 0.5 MG PO TABS
0.5000 mg | ORAL_TABLET | Freq: Every day | ORAL | Status: DC
  Administered 2022-03-19: 0.5 mg via ORAL
  Filled 2022-03-18: qty 1

## 2022-03-18 MED ORDER — ALBUTEROL SULFATE HFA 108 (90 BASE) MCG/ACT IN AERS: 2.0000 | INHALATION_SPRAY | RESPIRATORY_TRACT | Status: DC | PRN

## 2022-03-18 MED ORDER — ONDANSETRON HCL 4 MG PO TABS
4.0000 mg | ORAL_TABLET | Freq: Four times a day (QID) | ORAL | Status: DC | PRN
  Administered 2022-03-19: 4 mg via ORAL
  Filled 2022-03-18: qty 1

## 2022-03-18 MED ORDER — INSULIN ASPART 100 UNIT/ML IJ SOLN: 0.0000 [IU] | Freq: Every day | INTRAMUSCULAR | Status: DC

## 2022-03-18 MED ORDER — PIRFENIDONE 801 MG PO TABS
801.0000 mg | ORAL_TABLET | Freq: Three times a day (TID) | ORAL | Status: DC
  Administered 2022-03-19 – 2022-03-20 (×4): 801 mg via ORAL

## 2022-03-18 MED ORDER — HYDRALAZINE HCL 20 MG/ML IJ SOLN
10.0000 mg | Freq: Four times a day (QID) | INTRAMUSCULAR | Status: DC | PRN
  Administered 2022-03-18: 10 mg via INTRAVENOUS
  Filled 2022-03-18: qty 1

## 2022-03-18 MED ORDER — OXYCODONE-ACETAMINOPHEN 7.5-325 MG PO TABS: 1.0000 | ORAL_TABLET | ORAL | Status: DC | PRN

## 2022-03-18 MED ORDER — INSULIN ASPART 100 UNIT/ML IJ SOLN
0.0000 [IU] | Freq: Three times a day (TID) | INTRAMUSCULAR | Status: DC
  Administered 2022-03-18 (×2): 2 [IU] via SUBCUTANEOUS
  Filled 2022-03-18: qty 1

## 2022-03-18 MED ORDER — ONDANSETRON HCL 4 MG/2ML IJ SOLN: 4.0000 mg | Freq: Four times a day (QID) | INTRAMUSCULAR | Status: DC | PRN

## 2022-03-18 MED ORDER — MILNACIPRAN HCL 50 MG PO TABS
50.0000 mg | ORAL_TABLET | Freq: Two times a day (BID) | ORAL | Status: DC
  Administered 2022-03-18 – 2022-03-20 (×4): 50 mg via ORAL
  Filled 2022-03-18 (×9): qty 1

## 2022-03-18 NOTE — Progress Notes (Signed)
ANTICOAGULATION CONSULT NOTE   Pharmacy Consult for heparin Indication: pulmonary embolus  Allergies  Allergen Reactions   Latex    Pseudoephedrine Other (See Comments)    Rash and hallucinations    Sulfa Antibiotics    Sulfonamide Derivatives Rash    Patient Measurements: Height: '5\' 5"'$  (165.1 cm) Weight: 72.6 kg (160 lb) IBW/kg (Calculated) : 57 Heparin Dosing Weight: 71.7 kg  Vital Signs: Temp: 98.4 F (36.9 C) (12/30 0640) Temp Source: Oral (12/30 0640) BP: 147/62 (12/30 1000) Pulse Rate: 96 (12/30 1000)  Labs: Recent Labs    03/17/22 1504 03/17/22 1510 03/17/22 1637 03/18/22 0204 03/18/22 0605 03/18/22 1006  HGB  --  12.7  --   --  11.5*  --   HCT  --  38.9  --   --  35.7*  --   PLT  --  357  --   --  386  --   APTT 28  --   --   --   --   --   LABPROT 12.9  --   --   --   --   --   INR 1.0  --   --   --   --   --   HEPARINUNFRC  --   --   --  0.41  --  0.32  CREATININE  --  0.66  --   --  0.56  --   TROPONINIHS  --  158* 151*  --   --   --      Estimated Creatinine Clearance: 66.2 mL/min (by C-G formula based on SCr of 0.56 mg/dL).   Medical History: Past Medical History:  Diagnosis Date   Coronary artery calcification seen on CT scan    Essential hypertension    Fibromyalgia    Idiopathic pulmonary fibrosis (HCC)    Mixed hyperlipidemia    Osteoporosis    Type 2 diabetes mellitus (HCC)     Medications:  (Not in a hospital admission) Scheduled:   budesonide (PULMICORT) nebulizer solution  0.25 mg Nebulization BID   [START ON 03/19/2022] clonazePAM  0.5 mg Oral QHS   [START ON 03/19/2022] fenofibrate  54 mg Oral QHS   insulin aspart  0-15 Units Subcutaneous TID WC   insulin aspart  0-5 Units Subcutaneous QHS   losartan  50 mg Oral Daily   metoprolol succinate  50 mg Oral Daily   Milnacipran  50 mg Oral BID   pantoprazole  40 mg Oral Daily   Pirfenidone  801 mg Oral TID WC   tiZANidine  4 mg Oral QHS   Infusions:   heparin 1,200  Units/hr (03/18/22 0859)    Assessment: Pt admitted with chest tightness and feeling like she "can't get enough air". Pt has pulmonary fibrosis, diabetes, HTN, and fibromyalgia with proximal left humerus fracture from fall last week. Pt only on aspirin 81 mg daily, no oral anticoagulant on admission. CTA show acute PE with moderate embolic burden and right heart strain. (RV/LV Ratio =1.47 )   Heparin level 0.32 is therapeutic on low end. Will increase slightly  Goal of Therapy:  Heparin level 0.3-0.7 units/ml Monitor platelets by anticoagulation protocol: Yes   Plan:  Increase heparin 1300 units/hr Heparin level daily Monitor for S/S of bleeding F/U transition to po Tx  Isac Sarna, BS Pharm D, BCPS Clinical Pharmacist

## 2022-03-18 NOTE — TOC Progression Note (Signed)
  Transition of Care [facility]) Screening Note   Patient Details  Name: [name] Date of Birth: 11-02-1950   Transition of Care [facility]) CM/SW Contact:    Boneta Lucks, RN Phone Number: 03/18/2022, 12:42 PM    Transition of Care Department [facility]) has reviewed patient and no TOC needs have been identified at this time. We will continue to monitor patient advancement through interdisciplinary progression rounds. If new patient transition needs arise, please place a TOC consult.      Barriers to Discharge: Continued Medical Work up  Expected Discharge Plan and Services       Living arrangements for the past 2 months: Single Family Home                   Social Determinants of Health (SDOH) Interventions SDOH Screenings   Depression (PHQ2-9): Medium Risk (08/13/2019)  Tobacco Use: Low Risk  (03/17/2022)    Readmission Risk Interventions     No data to display

## 2022-03-18 NOTE — Progress Notes (Signed)
ANTICOAGULATION CONSULT NOTE   Pharmacy Consult for heparin Indication: pulmonary embolus  Allergies  Allergen Reactions   Latex    Pseudoephedrine Other (See Comments)    Rash and hallucinations    Sulfa Antibiotics    Sulfonamide Derivatives Rash    Patient Measurements: Height: '5\' 5"'$  (165.1 cm) Weight: 72.6 kg (160 lb) IBW/kg (Calculated) : 57 Heparin Dosing Weight: 71.7 kg  Vital Signs: Temp: 98.1 F (36.7 C) (12/30 0239) Temp Source: Oral (12/30 0239) BP: 153/77 (12/30 0239) Pulse Rate: 94 (12/30 0239)  Labs: Recent Labs    03/17/22 1504 03/17/22 1510 03/17/22 1637 03/18/22 0204  HGB  --  12.7  --   --   HCT  --  38.9  --   --   PLT  --  357  --   --   APTT 28  --   --   --   LABPROT 12.9  --   --   --   INR 1.0  --   --   --   HEPARINUNFRC  --   --   --  0.41  CREATININE  --  0.66  --   --   TROPONINIHS  --  158* 151*  --      Estimated Creatinine Clearance: 66.2 mL/min (by C-G formula based on SCr of 0.66 mg/dL).   Medical History: Past Medical History:  Diagnosis Date   Coronary artery calcification seen on CT scan    Essential hypertension    Fibromyalgia    Idiopathic pulmonary fibrosis (HCC)    Mixed hyperlipidemia    Osteoporosis    Type 2 diabetes mellitus (HCC)     Medications:  (Not in a hospital admission) Scheduled:   budesonide (PULMICORT) nebulizer solution  0.25 mg Nebulization BID   clonazePAM  0.5 mg Oral Daily   fenofibrate  54 mg Oral Daily   insulin aspart  0-15 Units Subcutaneous TID WC   insulin aspart  0-5 Units Subcutaneous QHS   losartan  50 mg Oral Daily   metoprolol succinate  50 mg Oral Daily   Milnacipran  50 mg Oral BID   pantoprazole  40 mg Oral Daily   Pirfenidone  801 mg Oral TID WC   tiZANidine  4 mg Oral QHS   Infusions:   heparin 1,200 Units/hr (03/17/22 2031)    Assessment: Pt admitted with chest tightness and feeling like she "can't get enough air". Pt has pulmonary fibrosis, diabetes, HTN, and  fibromyalgia with proximal left humerus fracture from fall last week. Pt only on aspirin 81 mg daily, no oral anticoagulant on admission.  12/30 AM update:  Heparin level therapeutic  Goal of Therapy:  Heparin level 0.3-0.7 units/ml Monitor platelets by anticoagulation protocol: Yes   Plan:  Cont heparin 1200 units/hr 1000 heparin level  Narda Bonds, PharmD, BCPS Clinical Pharmacist Phone: [phone]

## 2022-03-18 NOTE — Progress Notes (Signed)
PROGRESS NOTE     Nichole Reyes, is a 71 y.o. female, DOB - 02/14/1951, MHD:[id number]  Admit date - 03/17/2022   Admitting Physician Truett Mainland, DO  Outpatient Primary MD for the patient is Lanelle Bal, PA-C  LOS - 1  Chief Complaint  Patient presents with   Chest Pain       Brief Narrative:  71 y.o. female with medical history significant for HTN, DM2, HLD and CAD admitted on 03/17/22 with acute hypoxic respiratory failure secondary to acute PE -Patient was diagnosed with COVID around 02/10/2022 she was recovering from [place] when suddenly became short of breath around 03/16/2022, found to have acute PE    -Assessment and Plan: 1) acute hypoxic respiratory failure secondary to acute PE-- -CTA chest consistent with Acute pulmonary embolism with moderate embolic burden. Positive for acute PE with CT evidence of right heart strain (RV/LV Ratio = 1.47 ) consistent with at least submassive (intermediate risk) PE. -Lower extremity venous Dopplers and echocardiogram pending -Continue IV heparin -Supplemental oxygen  2) recent COVID-19 infection--- initially tested positive on 02/10/2022 -Shortness of breath most likely from acute PE rather than COVID per se -Supportive care  3)DM2-A1c 6.0 reflecting excellent diabetic control PTA -Hold metformin -Use Novolog/Humalog Sliding scale insulin with Accu-Cheks/Fingersticks as ordered  4)HLD-continue Repatha as outpatient  5)HTN--continue Toprol-XL 50 mg daily, continue losartan -IV hydralazine as needed elevated BP  6)GERD--continue PPI  7) history of COPD/interstitial lung disease--- continue bronchodilators  Status is: Inpatient   Disposition: The patient is from: Home              Anticipated d/c is to: Home              Anticipated d/c date is: 2 days              Patient currently is not medically stable to d/c. Barriers: Not Clinically Stable-   Code Status :  -  Code Status: Full Code   Family  Communication:    NA (patient is alert, awake and coherent)   DVT Prophylaxis  :   - iv Heparin     Lab Results  Component Value Date   PLT 386 03/18/2022    Inpatient Medications  Scheduled Meds:  budesonide (PULMICORT) nebulizer solution  0.25 mg Nebulization BID   Chlorhexidine Gluconate Cloth  6 each Topical Daily   [START ON 03/19/2022] clonazePAM  0.5 mg Oral QHS   [START ON 03/19/2022] fenofibrate  54 mg Oral QHS   insulin aspart  0-15 Units Subcutaneous TID WC   insulin aspart  0-5 Units Subcutaneous QHS   losartan  50 mg Oral Daily   metoprolol succinate  50 mg Oral Daily   Milnacipran  50 mg Oral BID   pantoprazole  40 mg Oral Daily   Pirfenidone  801 mg Oral TID WC   [START ON 03/19/2022] pneumococcal 20-valent conjugate vaccine  0.5 mL Intramuscular Tomorrow-1000   tiZANidine  4 mg Oral QHS   Continuous Infusions:  heparin 1,300 Units/hr (03/18/22 1112)   PRN Meds:.albuterol, hydrALAZINE, ondansetron **OR** ondansetron (ZOFRAN) IV, oxyCODONE, oxyCODONE-acetaminophen **AND** oxyCODONE   Anti-infectives (From admission, onward)    None         Subjective: Nichole Reyes today has no fevers, no emesis,   Hypoxia and dyspnea on exertion and dyspnea at rest persist   Objective: Vitals:   03/18/22 1530 03/18/22 1537 03/18/22 1555 03/18/22 1600  BP: (!) 148/60  (!) 168/62 (!) 160/61  Pulse:  93  94 90  Resp: (!) '23  20 19  '$ Temp:  97.9 F (36.6 C)    TempSrc:  Oral    SpO2: 97%  96% 96%  Weight:    72.4 kg  Height:        Intake/Output Summary (Last 24 hours) at 03/18/2022 1616 Last data filed at 03/18/2022 1611 Gross per 24 hour  Intake 339.8 ml  Output --  Net 339.8 ml   Filed Weights   03/17/22 1407 03/18/22 1600  Weight: 72.6 kg 72.4 kg    Physical Exam  Gen:- Awake Alert, dyspnea on exertion but no conversational dyspnea HEENT:- [place].AT, No sclera icterus Nose- [place] 2L/min Neck-Supple Neck,No JVD,.  Lungs-no wheezing, somewhat  diminished breath sounds  CV- S1, S2 normal, regular  Abd-  +ve B.Sounds, Abd Soft, No tenderness,    Extremity/Skin:- No  edema, pedal pulses present  Psych-affect is appropriate, oriented x3 Neuro-no new focal deficits, no tremors  Data Reviewed: I have personally reviewed following labs and imaging studies  CBC: Recent Labs  Lab 03/17/22 1510 03/18/22 0605  WBC 14.8* 15.1*  HGB 12.7 11.5*  HCT 38.9 35.7*  MCV 97.7 97.0  PLT 357 852   Basic Metabolic Panel: Recent Labs  Lab 03/17/22 1510 03/18/22 0605  NA 139 139  K 3.4* 3.5  CL 104 107  CO2 26 23  GLUCOSE 133* 153*  BUN 11 12  CREATININE 0.66 0.56  CALCIUM 9.6 9.5   GFR: Estimated Creatinine Clearance: 66.2 mL/min (by C-G formula based on SCr of 0.56 mg/dL).  HbA1C: Recent Labs    03/17/22 1504  HGBA1C 6.0*    Recent Results (from the past 240 hour(s))  Resp panel by RT-PCR (RSV, Flu A&B, Covid) Anterior Nasal Swab     Status: Abnormal   Collection Time: 03/17/22  2:05 PM   Specimen: Anterior Nasal Swab  Result Value Ref Range Status   SARS Coronavirus 2 by RT PCR POSITIVE (A) NEGATIVE Final    Comment: (NOTE) SARS-CoV-2 target nucleic acids are DETECTED.  The SARS-CoV-2 RNA is generally detectable in upper respiratory specimens during the acute phase of infection. Positive results are indicative of the presence of the identified virus, but do not rule out bacterial infection or co-infection with other pathogens not detected by the test. Clinical correlation with patient history and other diagnostic information is necessary to determine patient infection status. The expected result is Negative.  Fact Sheet for Patients: EntrepreneurPulse.com.au  Fact Sheet for Healthcare Providers: IncredibleEmployment.be  This test is not yet approved or cleared by the [place] FDA and  has been authorized for detection and/or diagnosis of SARS-CoV-2 by FDA under an  Emergency Use Authorization (EUA).  This EUA will remain in effect (meaning this test can be used) for the duration of  the COVID-19 declaration under Section 564(b)(1) of the A ct, 21 U.S.C. section 360bbb-3(b)(1), unless the authorization is terminated or revoked sooner.     Influenza A by PCR NEGATIVE NEGATIVE Final   Influenza B by PCR NEGATIVE NEGATIVE Final    Comment: (NOTE) The Xpert Xpress SARS-CoV-2/FLU/RSV plus assay is intended as an aid in the diagnosis of influenza from Nasopharyngeal swab specimens and should not be used as a sole basis for treatment. Nasal washings and aspirates are unacceptable for Xpert Xpress SARS-CoV-2/FLU/RSV testing.  Fact Sheet for Patients: EntrepreneurPulse.com.au  Fact Sheet for Healthcare Providers: IncredibleEmployment.be  This test is not yet approved or cleared by the [place] and  has been authorized for detection and/or diagnosis of SARS-CoV-2 by FDA under an Emergency Use Authorization (EUA). This EUA will remain in effect (meaning this test can be used) for the duration of the COVID-19 declaration under Section 564(b)(1) of the Act, 21 U.S.C. section 360bbb-3(b)(1), unless the authorization is terminated or revoked.     Resp Syncytial Virus by PCR NEGATIVE NEGATIVE Final    Comment: (NOTE) Fact Sheet for Patients: EntrepreneurPulse.com.au  Fact Sheet for Healthcare Providers: IncredibleEmployment.be  This test is not yet approved or cleared by the [place] FDA and has been authorized for detection and/or diagnosis of SARS-CoV-2 by FDA under an Emergency Use Authorization (EUA). This EUA will remain in effect (meaning this test can be used) for the duration of the COVID-19 declaration under Section 564(b)(1) of the Act, 21 U.S.C. section 360bbb-3(b)(1), unless the authorization is terminated or revoked.  Performed at [facility],  [address], [place], [place] [address]     Radiology Studies: CT Angio Chest PE W and/or Wo Contrast  Addendum Date: 03/17/2022   ADDENDUM REPORT: 03/17/2022 17:37 ADDENDUM: These results were called by telephone at the time of interpretation on 03/17/2022 at 5:37 pm to provider Roderic Palau, MD, who verbally acknowledged these results. Electronically Signed   By: Fidela Salisbury M.D.   On: 03/17/2022 17:37   Result Date: 03/17/2022 CLINICAL DATA:  Pulmonary embolism (PE) suspected, high prob. COVID pneumonia, chest pain EXAM: CT ANGIOGRAPHY CHEST WITH CONTRAST TECHNIQUE: Multidetector CT imaging of the chest was performed using the standard protocol during bolus administration of intravenous contrast. Multiplanar CT image reconstructions and MIPs were obtained to evaluate the vascular anatomy. RADIATION DOSE REDUCTION: This exam was performed according to the departmental dose-optimization program which includes automated exposure control, adjustment of the mA and/or kV according to patient size and/or use of iterative reconstruction technique. CONTRAST:  74m OMNIPAQUE IOHEXOL 350 MG/ML SOLN COMPARISON:  05/31/2020 FINDINGS: Cardiovascular: Multiple branching intraluminal filling defects are seen within the lobar and segmental pulmonary arteries bilaterally in keeping with acute pulmonary embolism. The overall embolic burden is moderate. The central pulmonary arteries are of normal caliber. However, there is dilation of the right ventricle with reversal of the normal RV/LV ratio which is approximally 1.47. Global cardiac size is within normal limits. Moderate coronary artery calcification. No pericardial effusion. Mild atherosclerotic calcification within the thoracic aorta. No aortic aneurysm. Mediastinum/Nodes: Single borderline enlarged infrahilar lymph node measures 15 mm in short axis diameter, minimally enlarged since prior examination where this measured 12 mm. This is nonspecific. No other pathologic  adenopathy within the thorax. The visualized thyroid is unremarkable. The esophagus is unremarkable. Lungs/Pleura: Subpleural pulmonary nodule within the right middle lobe, axial image # 80/6, measuring 8 mm in mean diameter demonstrates minimal enlargement when compared to prior examination. Subpleural nodule within the right lower lobe, axial image # 64/6, measuring 13 mm in mean diameter is stable since prior examination. Subpleural pulmonary fibrosis demonstrating a basilar gradient is again identified and appears stable since prior examination. No pneumothorax or pleural effusion. No central obstructing lesion. Upper Abdomen: No acute abnormality. Musculoskeletal: Acute fracture of the left humeral head is seen with fracture planes involving the greater tuberosity and surgical neck of the humerus is again seen, better seen on CT examination of 03/09/2022. No other fracture identified. Osseous structures are otherwise age-appropriate. Review of the MIP images confirms the above findings. IMPRESSION: 1. Acute pulmonary embolism with moderate embolic burden. Positive for acute PE with CT evidence of right  heart strain (RV/LV Ratio = 1.47 ) consistent with at least submassive (intermediate risk) PE. The presence of right heart strain has been associated with an increased risk of morbidity and mortality. Please refer to the "Code PE Focused" order set in EPIC. 2. Moderate coronary artery calcification. 3. Stable subpleural pulmonary fibrosis demonstrating a basilar gradient. 4. Acute fracture of the left humeral head with fracture planes involving the greater tuberosity and surgical neck of the humerus, better seen on CT examination of 03/09/2022. 5. Multiple pulmonary nodules. Evaluation is slightly limited due to motion artifact and I suspect these nodules are likely stable though repeat evaluation with nonemergent CT examination of the chest is recommended once the patient's acute issues have resolved. Aortic  Atherosclerosis (ICD10-I70.0). Electronically Signed: By: Fidela Salisbury M.D. On: 03/17/2022 17:32   DG Chest 2 View  Result Date: 03/17/2022 CLINICAL DATA:  Chest pain and shortness of breath EXAM: CHEST - 2 VIEW COMPARISON:  CT 05/31/2020. FINDINGS: Lateral view degraded by patient arm position. Patient rotated to the right on the frontal, which is reverse apical lordotic in positioning. Mild cardiomegaly. Atherosclerosis in the transverse aorta. Mild right hemidiaphragm elevation. No pleural effusion or pneumothorax. Peripheral and basilar predominant interstitial thickening likely related to the clinical history of interstitial lung disease detailed on the prior CT. This is greater right than left. No evidence of superimposed lobar consolidation or congestive failure. IMPRESSION: Asymmetric peripheral and basilar predominant interstitial thickening is likely related to the interstitial lung disease detailed on 05/31/2020 CT. No convincing evidence of acute superimposed process. Given asymmetry, a subtle or early right lower lobe pneumonia cannot be entirely excluded. Cardiomegaly without congestive failure. Aortic Atherosclerosis (ICD10-I70.0). Electronically Signed   By: Abigail Miyamoto M.D.   On: 03/17/2022 14:36     Scheduled Meds:  budesonide (PULMICORT) nebulizer solution  0.25 mg Nebulization BID   Chlorhexidine Gluconate Cloth  6 each Topical Daily   [START ON 03/19/2022] clonazePAM  0.5 mg Oral QHS   [START ON 03/19/2022] fenofibrate  54 mg Oral QHS   insulin aspart  0-15 Units Subcutaneous TID WC   insulin aspart  0-5 Units Subcutaneous QHS   losartan  50 mg Oral Daily   metoprolol succinate  50 mg Oral Daily   Milnacipran  50 mg Oral BID   pantoprazole  40 mg Oral Daily   Pirfenidone  801 mg Oral TID WC   [START ON 03/19/2022] pneumococcal 20-valent conjugate vaccine  0.5 mL Intramuscular Tomorrow-1000   tiZANidine  4 mg Oral QHS   Continuous Infusions:  heparin 1,300 Units/hr  (03/18/22 1112)     LOS: 1 day    Roxan Hockey M.D on 03/18/2022 at 4:16 PM  Go to [email or web address] - for contact info  [facility] - Office  [phone]  If 7PM-7AM, please contact night-coverage [email or web address] 03/18/2022, 4:16 PM

## 2022-03-18 NOTE — ED Notes (Signed)
Maudie Mercury, RT made aware of pt order for pulmicort neb

## 2022-03-18 NOTE — ED Notes (Signed)
Pt given frozen dinner tray per request.

## 2022-03-18 NOTE — ED Notes (Addendum)
Pt spouse given blanket

## 2022-03-19 ENCOUNTER — Inpatient Hospital Stay (HOSPITAL_COMMUNITY): Payer: Medicare Other

## 2022-03-19 ENCOUNTER — Other Ambulatory Visit (HOSPITAL_COMMUNITY): Payer: Self-pay | Admitting: *Deleted

## 2022-03-19 DIAGNOSIS — I2699 Other pulmonary embolism without acute cor pulmonale: Secondary | ICD-10-CM | POA: Diagnosis not present

## 2022-03-19 DIAGNOSIS — J849 Interstitial pulmonary disease, unspecified: Secondary | ICD-10-CM | POA: Diagnosis not present

## 2022-03-19 DIAGNOSIS — I2609 Other pulmonary embolism with acute cor pulmonale: Secondary | ICD-10-CM

## 2022-03-19 LAB — GLUCOSE, CAPILLARY
Glucose-Capillary: 111 mg/dL — ABNORMAL HIGH (ref 70–99)
Glucose-Capillary: 124 mg/dL — ABNORMAL HIGH (ref 70–99)
Glucose-Capillary: 98 mg/dL (ref 70–99)
Glucose-Capillary: 110 mg/dL — ABNORMAL HIGH (ref 70–99)

## 2022-03-19 LAB — CBC
HCT: 36.3 % (ref 36.0–46.0)
Hemoglobin: 11.3 g/dL — ABNORMAL LOW (ref 12.0–15.0)
MCH: 31.3 pg (ref 26.0–34.0)
MCHC: 31.1 g/dL (ref 30.0–36.0)
MCV: 100.6 fL — ABNORMAL HIGH (ref 80.0–100.0)
Platelets: 270 10*3/uL (ref 150–400)
RBC: 3.61 MIL/uL — ABNORMAL LOW (ref 3.87–5.11)
RDW: 13.8 % (ref 11.5–15.5)
WBC: 11.2 10*3/uL — ABNORMAL HIGH (ref 4.0–10.5)
nRBC: 0 % (ref 0.0–0.2)

## 2022-03-19 LAB — ECHOCARDIOGRAM LIMITED
Height: 65 in
S' Lateral: 2.5 cm
Weight: 2536.17 oz

## 2022-03-19 LAB — HEPARIN LEVEL (UNFRACTIONATED): Heparin Unfractionated: 0.34 IU/mL (ref 0.30–0.70)

## 2022-03-19 LAB — MRSA NEXT GEN BY PCR, NASAL: MRSA by PCR Next Gen: NOT DETECTED

## 2022-03-19 NOTE — Progress Notes (Signed)
*  PRELIMINARY RESULTS* Echocardiogram Very Limited 2-D Echocardiogram  has been performed. Patient has a spiral fracture of left humerus per patient. Is in a immobilizer sling. Patient can NOT move arm at all without extreme pain even with nurse help could only obtain very limited echo.  Nichole Reyes 03/19/2022, 4:38 PM

## 2022-03-19 NOTE — Progress Notes (Signed)
ANTICOAGULATION CONSULT NOTE   Pharmacy Consult for heparin Indication: pulmonary embolus  Allergies  Allergen Reactions   Latex    Pseudoephedrine Other (See Comments)    Rash and hallucinations    Sulfa Antibiotics    Sulfonamide Derivatives Rash    Patient Measurements: Height: '5\' 5"'$  (165.1 cm) Weight: 71.9 kg (158 lb 8.2 oz) IBW/kg (Calculated) : 57 Heparin Dosing Weight: 71.7 kg  Vital Signs: Temp: 98.4 F (36.9 C) (12/31 0743) Temp Source: Oral (12/31 0743) BP: 114/93 (12/31 0734) Pulse Rate: 88 (12/31 0743)  Labs: Recent Labs    03/17/22 1504 03/17/22 1510 03/17/22 1510 03/17/22 1637 03/18/22 0204 03/18/22 0605 03/18/22 1006 03/19/22 0358  HGB  --  12.7   < >  --   --  11.5*  --  11.3*  HCT  --  38.9  --   --   --  35.7*  --  36.3  PLT  --  357  --   --   --  386  --  270  APTT 28  --   --   --   --   --   --   --   LABPROT 12.9  --   --   --   --   --   --   --   INR 1.0  --   --   --   --   --   --   --   HEPARINUNFRC  --   --   --   --  0.41  --  0.32 0.34  CREATININE  --  0.66  --   --   --  0.56  --   --   TROPONINIHS  --  158*  --  151*  --   --   --   --    < > = values in this interval not displayed.     Estimated Creatinine Clearance: 66 mL/min (by C-G formula based on SCr of 0.56 mg/dL).   Medical History: Past Medical History:  Diagnosis Date   Coronary artery calcification seen on CT scan    Essential hypertension    Fibromyalgia    Idiopathic pulmonary fibrosis (HCC)    Mixed hyperlipidemia    Osteoporosis    Type 2 diabetes mellitus (HCC)     Medications:  Medications Prior to Admission  Medication Sig Dispense Refill Last Dose   albuterol (VENTOLIN HFA) 108 (90 Base) MCG/ACT inhaler Inhale 2 puffs into the lungs every 4 (four) hours as needed for wheezing or shortness of breath.   unknown   aspirin EC 81 MG tablet Take 81 mg by mouth daily.   03/17/2022   Calcium Carbonate (CALTRATE 600 PO) Take 1 tablet by mouth 2 (two)  times a day.   03/17/2022   clonazePAM (KLONOPIN) 0.5 MG tablet Take 0.5 mg by mouth at bedtime.   03/17/2022   cyanocobalamin (VITAMIN B12) 1000 MCG tablet Take 1,000 mcg by mouth daily.   03/17/2022   ESBRIET 801 MG TABS Take 801 mg by mouth 3 (three) times daily with meals. 270 tablet 1 03/17/2022   fenofibrate (TRICOR) 48 MG tablet Take 48 mg by mouth at bedtime.   03/17/2022   fluticasone (FLOVENT HFA) 110 MCG/ACT inhaler INHALE 1 PUFF INTO THE LUNGS 2 TIMES A DAY. (Patient taking differently: Inhale 1 puff into the lungs 2 (two) times daily.) 12 g 3 03/17/2022   losartan (COZAAR) 50 MG tablet Take 50 mg by mouth daily.  03/17/2022   metFORMIN (GLUCOPHAGE-XR) 500 MG 24 hr tablet Take 500 mg by mouth daily with breakfast.    03/17/2022   metoprolol succinate (TOPROL-XL) 50 MG 24 hr tablet Take 50 mg by mouth daily.    03/17/2022 at 1030   omeprazole (PRILOSEC) 20 MG capsule TAKE 1 CAPSULE BY MOUTH ONCE DAILY. (Patient taking differently: Take 20 mg by mouth daily.) 30 capsule 5 03/17/2022   Oxycodone HCl 10 MG TABS Take 10 mg by mouth daily as needed (severe pain).   03/16/2022   promethazine (PHENERGAN) 12.5 MG tablet Take 1 tablet (12.5 mg total) by mouth every 6 (six) hours as needed for nausea or vomiting. 30 tablet 0 unknown   REPATHA SURECLICK 505 MG/ML SOAJ Inject 1 mL into the skin every 14 (fourteen) days.   unknown   SAVELLA 50 MG TABS tablet Take 50 mg by mouth 2 (two) times daily.    03/17/2022   tiZANidine (ZANAFLEX) 4 MG tablet TAKE (1) TABLET EVERY NIGHT AT BEDTIME. (Patient taking differently: Take 4 mg by mouth at bedtime. TAKE (1) TABLET EVERY NIGHT AT BEDTIME.) 30 tablet 0 03/16/2022   ACCU-CHEK AVIVA PLUS test strip       Accu-Chek Softclix Lancets lancets       oxyCODONE-acetaminophen (PERCOCET) 7.5-325 MG tablet Take 1 tablet by mouth every 4 (four) hours as needed for severe pain. (Patient not taking: Reported on 03/17/2022) 10 tablet 0 Not Taking   Scheduled:    budesonide (PULMICORT) nebulizer solution  0.25 mg Nebulization BID   Chlorhexidine Gluconate Cloth  6 each Topical Daily   clonazePAM  0.5 mg Oral QHS   fenofibrate  54 mg Oral QHS   insulin aspart  0-15 Units Subcutaneous TID WC   insulin aspart  0-5 Units Subcutaneous QHS   losartan  50 mg Oral Daily   metoprolol succinate  50 mg Oral Daily   Milnacipran  50 mg Oral BID   pantoprazole  40 mg Oral Daily   Pirfenidone  801 mg Oral TID WC   pneumococcal 20-valent conjugate vaccine  0.5 mL Intramuscular Tomorrow-1000   tiZANidine  4 mg Oral QHS   Infusions:   heparin 1,300 Units/hr (03/18/22 1112)    Assessment: Pt admitted with chest tightness and feeling like she "can't get enough air". Pt has pulmonary fibrosis, diabetes, HTN, and fibromyalgia with proximal left humerus fracture from fall last week. Pt only on aspirin 81 mg daily, no oral anticoagulant on admission. CTA show acute PE with moderate embolic burden and right heart strain. (RV/LV Ratio =1.47 )   Heparin level 0.34 is therapeutic   Goal of Therapy:  Heparin level 0.3-0.7 units/ml Monitor platelets by anticoagulation protocol: Yes   Plan:  Continue heparin at 1300 units/hr Heparin level daily Monitor for S/S of bleeding F/U transition to po Tx  Isac Sarna, BS Pharm D, BCPS Clinical Pharmacist

## 2022-03-19 NOTE — Progress Notes (Signed)
PROGRESS NOTE     Nichole Reyes, is a 71 y.o. female, DOB - 1951/03/11, ZOX:[id number]  Admit date - 03/17/2022   Admitting Physician Truett Mainland, DO  Outpatient Primary MD for the patient is Lanelle Bal, PA-C  LOS - 2  Chief Complaint  Patient presents with   Chest Pain       Brief Narrative:  71 y.o. female with medical history significant for HTN, DM2, HLD and CAD admitted on 03/17/22 with acute hypoxic respiratory failure secondary to acute PE -Patient was diagnosed with COVID around 02/10/2022 she was recovering from [place] when suddenly became short of breath around 03/16/2022, found to have acute PE    -Assessment and Plan: 1) acute hypoxic respiratory failure secondary to acute PE-- -CTA chest consistent with Acute pulmonary embolism with moderate embolic burden. Positive for acute PE with CT evidence of right heart strain (RV/LV Ratio = 1.47 ) consistent with at least submassive (intermediate risk) PE. -Lower extremity venous Dopplers and echocardiogram pending -Continue IV heparin 03/19/22 -Attempted to wean off oxygen the patient desaturated continue oxygen supplementation at 2 L per nasal cannula -Hoping to transition to p.o. Eliquis on 03/20/2022  2)Recent COVID-19 infection--- initially tested positive on 02/10/2022 -Shortness of breath most likely from acute PE rather than COVID per se -Supportive care  3)DM2-A1c 6.0 reflecting excellent diabetic control PTA -Hold metformin -Use Novolog/Humalog Sliding scale insulin with Accu-Cheks/Fingersticks as ordered  4)HLD-continue Repatha as outpatient  5)HTN--continue Toprol-XL 50 mg daily, continue losartan -IV hydralazine as needed elevated BP  6)GERD--continue PPI  7) history of COPD/interstitial lung disease/pulmonary fibrosis ---  -PTA patient was on Pirfenidone for ILD/IPF -continue bronchodilators  Status is: Inpatient   Disposition: The patient is from: Home              Anticipated d/c is to:  Home              Anticipated d/c date is: 2 days              Patient currently is not medically stable to d/c. Barriers: Not Clinically Stable-   Code Status :  -  Code Status: Full Code   Family Communication:    (patient is alert, awake and coherent)  Discussed with husband at bedside  DVT Prophylaxis  :   - iv Heparin     Lab Results  Component Value Date   PLT 270 03/19/2022    Inpatient Medications  Scheduled Meds:  budesonide (PULMICORT) nebulizer solution  0.25 mg Nebulization BID   Chlorhexidine Gluconate Cloth  6 each Topical Daily   clonazePAM  0.5 mg Oral QHS   fenofibrate  54 mg Oral QHS   insulin aspart  0-15 Units Subcutaneous TID WC   insulin aspart  0-5 Units Subcutaneous QHS   losartan  50 mg Oral Daily   metoprolol succinate  50 mg Oral Daily   Milnacipran  50 mg Oral BID   pantoprazole  40 mg Oral Daily   Pirfenidone  801 mg Oral TID WC   pneumococcal 20-valent conjugate vaccine  0.5 mL Intramuscular Tomorrow-1000   tiZANidine  4 mg Oral QHS   Continuous Infusions:  heparin 1,300 Units/hr (03/19/22 1131)   PRN Meds:.albuterol, hydrALAZINE, ondansetron **OR** ondansetron (ZOFRAN) IV, oxyCODONE, oxyCODONE-acetaminophen **AND** oxyCODONE   Anti-infectives (From admission, onward)    None         Subjective: Nichole Reyes today has no fevers, no emesis,   03/19/22 -Attempted to wean off oxygen the  patient desaturated continue oxygen supplementation at 2 L per nasal cannula -Husband at bedside questions answered -No chest pains dyspnea on exertion persist   Objective: Vitals:   03/19/22 0900 03/19/22 1000 03/19/22 1003 03/19/22 1115  BP: (!) 188/63 (!) 145/55    Pulse: 88 92  87  Resp: (!) '26 15  18  '$ Temp:    98 F (36.7 C)  TempSrc:    Oral  SpO2: 97% 96% 96% 97%  Weight:      Height:        Intake/Output Summary (Last 24 hours) at 03/19/2022 1205 Last data filed at 03/19/2022 1131 Gross per 24 hour  Intake 707.37 ml   Output --  Net 707.37 ml   Filed Weights   03/17/22 1407 03/18/22 1600 03/19/22 0500  Weight: 72.6 kg 72.4 kg 71.9 kg    Physical Exam  Gen:- Awake Alert, dyspnea on exertion but no conversational dyspnea HEENT:- [place].AT, No sclera icterus Nose- [place] 2L/min Neck-Supple Neck,No JVD,.  Lungs-no wheezing, improving air movement CV- S1, S2 normal, regular  Abd-  +ve B.Sounds, Abd Soft, No tenderness,    Extremity/Skin:- No  edema, pedal pulses present  Psych-affect is appropriate, oriented x3 Neuro-no new focal deficits, no tremors  Data Reviewed: I have personally reviewed following labs and imaging studies  CBC: Recent Labs  Lab 03/17/22 1510 03/18/22 0605 03/19/22 0358  WBC 14.8* 15.1* 11.2*  HGB 12.7 11.5* 11.3*  HCT 38.9 35.7* 36.3  MCV 97.7 97.0 100.6*  PLT 357 386 824   Basic Metabolic Panel: Recent Labs  Lab 03/17/22 1510 03/18/22 0605  NA 139 139  K 3.4* 3.5  CL 104 107  CO2 26 23  GLUCOSE 133* 153*  BUN 11 12  CREATININE 0.66 0.56  CALCIUM 9.6 9.5   GFR: Estimated Creatinine Clearance: 66 mL/min (by C-G formula based on SCr of 0.56 mg/dL).  HbA1C: Recent Labs    03/17/22 1504  HGBA1C 6.0*    Recent Results (from the past 240 hour(s))  Resp panel by RT-PCR (RSV, Flu A&B, Covid) Anterior Nasal Swab     Status: Abnormal   Collection Time: 03/17/22  2:05 PM   Specimen: Anterior Nasal Swab  Result Value Ref Range Status   SARS Coronavirus 2 by RT PCR POSITIVE (A) NEGATIVE Final    Comment: (NOTE) SARS-CoV-2 target nucleic acids are DETECTED.  The SARS-CoV-2 RNA is generally detectable in upper respiratory specimens during the acute phase of infection. Positive results are indicative of the presence of the identified virus, but do not rule out bacterial infection or co-infection with other pathogens not detected by the test. Clinical correlation with patient history and other diagnostic information is necessary to determine patient infection  status. The expected result is Negative.  Fact Sheet for Patients: EntrepreneurPulse.com.au  Fact Sheet for Healthcare Providers: IncredibleEmployment.be  This test is not yet approved or cleared by the [place] FDA and  has been authorized for detection and/or diagnosis of SARS-CoV-2 by FDA under an Emergency Use Authorization (EUA).  This EUA will remain in effect (meaning this test can be used) for the duration of  the COVID-19 declaration under Section 564(b)(1) of the A ct, 21 U.S.C. section 360bbb-3(b)(1), unless the authorization is terminated or revoked sooner.     Influenza A by PCR NEGATIVE NEGATIVE Final   Influenza B by PCR NEGATIVE NEGATIVE Final    Comment: (NOTE) The Xpert Xpress SARS-CoV-2/FLU/RSV plus assay is intended as an aid in the diagnosis of  influenza from Nasopharyngeal swab specimens and should not be used as a sole basis for treatment. Nasal washings and aspirates are unacceptable for Xpert Xpress SARS-CoV-2/FLU/RSV testing.  Fact Sheet for Patients: EntrepreneurPulse.com.au  Fact Sheet for Healthcare Providers: IncredibleEmployment.be  This test is not yet approved or cleared by the [place] FDA and has been authorized for detection and/or diagnosis of SARS-CoV-2 by FDA under an Emergency Use Authorization (EUA). This EUA will remain in effect (meaning this test can be used) for the duration of the COVID-19 declaration under Section 564(b)(1) of the Act, 21 U.S.C. section 360bbb-3(b)(1), unless the authorization is terminated or revoked.     Resp Syncytial Virus by PCR NEGATIVE NEGATIVE Final    Comment: (NOTE) Fact Sheet for Patients: EntrepreneurPulse.com.au  Fact Sheet for Healthcare Providers: IncredibleEmployment.be  This test is not yet approved or cleared by the [place] FDA and has been authorized for detection  and/or diagnosis of SARS-CoV-2 by FDA under an Emergency Use Authorization (EUA). This EUA will remain in effect (meaning this test can be used) for the duration of the COVID-19 declaration under Section 564(b)(1) of the Act, 21 U.S.C. section 360bbb-3(b)(1), unless the authorization is terminated or revoked.  Performed at [facility], [address], [place], [place] [address]   MRSA Next Gen by PCR, Nasal     Status: None   Collection Time: 03/18/22  3:47 PM   Specimen: Nasal Mucosa; Nasal Swab  Result Value Ref Range Status   MRSA by PCR Next Gen NOT DETECTED NOT DETECTED Final    Comment: (NOTE) The GeneXpert MRSA Assay (FDA approved for NASAL specimens only), is one component of a comprehensive MRSA colonization surveillance program. It is not intended to diagnose MRSA infection nor to guide or monitor treatment for MRSA infections. Test performance is not FDA approved in patients less than 16 years old. Performed at [facility], [address], [place], [place] [address]     Radiology Studies: CT Angio Chest PE W and/or Wo Contrast  Addendum Date: 03/17/2022   ADDENDUM REPORT: 03/17/2022 17:37 ADDENDUM: These results were called by telephone at the time of interpretation on 03/17/2022 at 5:37 pm to provider Roderic Palau, MD, who verbally acknowledged these results. Electronically Signed   By: Fidela Salisbury M.D.   On: 03/17/2022 17:37   Result Date: 03/17/2022 CLINICAL DATA:  Pulmonary embolism (PE) suspected, high prob. COVID pneumonia, chest pain EXAM: CT ANGIOGRAPHY CHEST WITH CONTRAST TECHNIQUE: Multidetector CT imaging of the chest was performed using the standard protocol during bolus administration of intravenous contrast. Multiplanar CT image reconstructions and MIPs were obtained to evaluate the vascular anatomy. RADIATION DOSE REDUCTION: This exam was performed according to the departmental dose-optimization program which includes automated exposure control, adjustment of the  mA and/or kV according to patient size and/or use of iterative reconstruction technique. CONTRAST:  53m OMNIPAQUE IOHEXOL 350 MG/ML SOLN COMPARISON:  05/31/2020 FINDINGS: Cardiovascular: Multiple branching intraluminal filling defects are seen within the lobar and segmental pulmonary arteries bilaterally in keeping with acute pulmonary embolism. The overall embolic burden is moderate. The central pulmonary arteries are of normal caliber. However, there is dilation of the right ventricle with reversal of the normal RV/LV ratio which is approximally 1.47. Global cardiac size is within normal limits. Moderate coronary artery calcification. No pericardial effusion. Mild atherosclerotic calcification within the thoracic aorta. No aortic aneurysm. Mediastinum/Nodes: Single borderline enlarged infrahilar lymph node measures 15 mm in short axis diameter, minimally enlarged since prior examination where this measured 12  mm. This is nonspecific. No other pathologic adenopathy within the thorax. The visualized thyroid is unremarkable. The esophagus is unremarkable. Lungs/Pleura: Subpleural pulmonary nodule within the right middle lobe, axial image # 80/6, measuring 8 mm in mean diameter demonstrates minimal enlargement when compared to prior examination. Subpleural nodule within the right lower lobe, axial image # 64/6, measuring 13 mm in mean diameter is stable since prior examination. Subpleural pulmonary fibrosis demonstrating a basilar gradient is again identified and appears stable since prior examination. No pneumothorax or pleural effusion. No central obstructing lesion. Upper Abdomen: No acute abnormality. Musculoskeletal: Acute fracture of the left humeral head is seen with fracture planes involving the greater tuberosity and surgical neck of the humerus is again seen, better seen on CT examination of 03/09/2022. No other fracture identified. Osseous structures are otherwise age-appropriate. Review of the MIP images  confirms the above findings. IMPRESSION: 1. Acute pulmonary embolism with moderate embolic burden. Positive for acute PE with CT evidence of right heart strain (RV/LV Ratio = 1.47 ) consistent with at least submassive (intermediate risk) PE. The presence of right heart strain has been associated with an increased risk of morbidity and mortality. Please refer to the "Code PE Focused" order set in EPIC. 2. Moderate coronary artery calcification. 3. Stable subpleural pulmonary fibrosis demonstrating a basilar gradient. 4. Acute fracture of the left humeral head with fracture planes involving the greater tuberosity and surgical neck of the humerus, better seen on CT examination of 03/09/2022. 5. Multiple pulmonary nodules. Evaluation is slightly limited due to motion artifact and I suspect these nodules are likely stable though repeat evaluation with nonemergent CT examination of the chest is recommended once the patient's acute issues have resolved. Aortic Atherosclerosis (ICD10-I70.0). Electronically Signed: By: Fidela Salisbury M.D. On: 03/17/2022 17:32   DG Chest 2 View  Result Date: 03/17/2022 CLINICAL DATA:  Chest pain and shortness of breath EXAM: CHEST - 2 VIEW COMPARISON:  CT 05/31/2020. FINDINGS: Lateral view degraded by patient arm position. Patient rotated to the right on the frontal, which is reverse apical lordotic in positioning. Mild cardiomegaly. Atherosclerosis in the transverse aorta. Mild right hemidiaphragm elevation. No pleural effusion or pneumothorax. Peripheral and basilar predominant interstitial thickening likely related to the clinical history of interstitial lung disease detailed on the prior CT. This is greater right than left. No evidence of superimposed lobar consolidation or congestive failure. IMPRESSION: Asymmetric peripheral and basilar predominant interstitial thickening is likely related to the interstitial lung disease detailed on 05/31/2020 CT. No convincing evidence of acute  superimposed process. Given asymmetry, a subtle or early right lower lobe pneumonia cannot be entirely excluded. Cardiomegaly without congestive failure. Aortic Atherosclerosis (ICD10-I70.0). Electronically Signed   By: Abigail Miyamoto M.D.   On: 03/17/2022 14:36     Scheduled Meds:  budesonide (PULMICORT) nebulizer solution  0.25 mg Nebulization BID   Chlorhexidine Gluconate Cloth  6 each Topical Daily   clonazePAM  0.5 mg Oral QHS   fenofibrate  54 mg Oral QHS   insulin aspart  0-15 Units Subcutaneous TID WC   insulin aspart  0-5 Units Subcutaneous QHS   losartan  50 mg Oral Daily   metoprolol succinate  50 mg Oral Daily   Milnacipran  50 mg Oral BID   pantoprazole  40 mg Oral Daily   Pirfenidone  801 mg Oral TID WC   pneumococcal 20-valent conjugate vaccine  0.5 mL Intramuscular Tomorrow-1000   tiZANidine  4 mg Oral QHS   Continuous Infusions:  heparin  1,300 Units/hr (03/19/22 1131)     LOS: 2 days    Roxan Hockey M.D on 03/19/2022 at 12:05 PM  Go to [email or web address] - for contact info  [facility] - Office  [phone]  If 7PM-7AM, please contact night-coverage [email or web address] 03/19/2022, 12:05 PM

## 2022-03-19 NOTE — Progress Notes (Signed)
Received a message from pharmacy asking to request family to bring in Pirfenidone as the hospital do not carry the medication. Call placed to patient's spouse, no answer, vm left requesting a call back.

## 2022-03-20 DIAGNOSIS — I2699 Other pulmonary embolism without acute cor pulmonale: Secondary | ICD-10-CM | POA: Diagnosis not present

## 2022-03-20 DIAGNOSIS — J849 Interstitial pulmonary disease, unspecified: Secondary | ICD-10-CM | POA: Diagnosis not present

## 2022-03-20 DIAGNOSIS — E1100 Type 2 diabetes mellitus with hyperosmolarity without nonketotic hyperglycemic-hyperosmolar coma (NKHHC): Secondary | ICD-10-CM | POA: Diagnosis not present

## 2022-03-20 DIAGNOSIS — I10 Essential (primary) hypertension: Secondary | ICD-10-CM | POA: Diagnosis not present

## 2022-03-20 LAB — CBC
HCT: 34.9 % — ABNORMAL LOW (ref 36.0–46.0)
Hemoglobin: 11.3 g/dL — ABNORMAL LOW (ref 12.0–15.0)
MCH: 32 pg (ref 26.0–34.0)
MCHC: 32.4 g/dL (ref 30.0–36.0)
MCV: 98.9 fL (ref 80.0–100.0)
Platelets: 353 10*3/uL (ref 150–400)
RBC: 3.53 MIL/uL — ABNORMAL LOW (ref 3.87–5.11)
RDW: 13.6 % (ref 11.5–15.5)
WBC: 9.5 10*3/uL (ref 4.0–10.5)
nRBC: 0 % (ref 0.0–0.2)

## 2022-03-20 LAB — HEPARIN LEVEL (UNFRACTIONATED): Heparin Unfractionated: 0.26 IU/mL — ABNORMAL LOW (ref 0.30–0.70)

## 2022-03-20 LAB — GLUCOSE, CAPILLARY
Glucose-Capillary: 111 mg/dL — ABNORMAL HIGH (ref 70–99)
Glucose-Capillary: 104 mg/dL — ABNORMAL HIGH (ref 70–99)

## 2022-03-20 MED ORDER — ELIQUIS DVT/PE STARTER PACK 5 MG PO TBPK: ORAL_TABLET | ORAL | 0 refills | Status: DC

## 2022-03-20 MED ORDER — APIXABAN 5 MG PO TABS: 5.0000 mg | ORAL_TABLET | Freq: Two times a day (BID) | ORAL | Status: DC

## 2022-03-20 MED ORDER — APIXABAN 5 MG PO TABS
10.0000 mg | ORAL_TABLET | Freq: Two times a day (BID) | ORAL | Status: DC
  Administered 2022-03-20: 10 mg via ORAL
  Filled 2022-03-20: qty 2

## 2022-03-20 MED ORDER — PNEUMOCOCCAL 20-VAL CONJ VACC 0.5 ML IM SUSY
0.5000 mL | PREFILLED_SYRINGE | INTRAMUSCULAR | Status: DC
  Filled 2022-03-20: qty 0.5

## 2022-03-20 MED ORDER — APIXABAN 5 MG PO TABS: 5.0000 mg | ORAL_TABLET | Freq: Two times a day (BID) | ORAL | 4 refills | Status: DC

## 2022-03-20 NOTE — Progress Notes (Signed)
ANTICOAGULATION CONSULT NOTE   Pharmacy Consult for heparin=> eliquis Indication: pulmonary embolus  Allergies  Allergen Reactions   Latex    Pseudoephedrine Other (See Comments)    Rash and hallucinations    Sulfa Antibiotics    Sulfonamide Derivatives Rash    Patient Measurements: Height: '5\' 5"'$  (165.1 cm) Weight: 71.9 kg (158 lb 8.2 oz) IBW/kg (Calculated) : 57 Heparin Dosing Weight: 71.7 kg  Vital Signs: Temp: 98 F (36.7 C) (01/01 0755) Temp Source: Oral (01/01 0755) BP: 166/72 (01/01 0600) Pulse Rate: 81 (01/01 0600)  Labs: Recent Labs     0000 03/17/22 1504 03/17/22 1510 03/17/22 1637 03/18/22 0204 03/18/22 0605 03/18/22 1006 03/19/22 0358 03/20/22 0434  HGB   < >  --  12.7  --   --  11.5*  --  11.3* 11.3*  HCT   < >  --  38.9  --   --  35.7*  --  36.3 34.9*  PLT   < >  --  357  --   --  386  --  270 353  APTT  --  28  --   --   --   --   --   --   --   LABPROT  --  12.9  --   --   --   --   --   --   --   INR  --  1.0  --   --   --   --   --   --   --   HEPARINUNFRC  --   --   --   --    < >  --  0.32 0.34 0.26*  CREATININE  --   --  0.66  --   --  0.56  --   --   --   TROPONINIHS  --   --  158* 151*  --   --   --   --   --    < > = values in this interval not displayed.     Estimated Creatinine Clearance: 66 mL/min (by C-G formula based on SCr of 0.56 mg/dL).   Medical History: Past Medical History:  Diagnosis Date   Coronary artery calcification seen on CT scan    Essential hypertension    Fibromyalgia    Idiopathic pulmonary fibrosis (HCC)    Mixed hyperlipidemia    Osteoporosis    Type 2 diabetes mellitus (HCC)     Medications:  Medications Prior to Admission  Medication Sig Dispense Refill Last Dose   albuterol (VENTOLIN HFA) 108 (90 Base) MCG/ACT inhaler Inhale 2 puffs into the lungs every 4 (four) hours as needed for wheezing or shortness of breath.   unknown   aspirin EC 81 MG tablet Take 81 mg by mouth daily.   03/17/2022    Calcium Carbonate (CALTRATE 600 PO) Take 1 tablet by mouth 2 (two) times a day.   03/17/2022   clonazePAM (KLONOPIN) 0.5 MG tablet Take 0.5 mg by mouth at bedtime.   03/17/2022   cyanocobalamin (VITAMIN B12) 1000 MCG tablet Take 1,000 mcg by mouth daily.   03/17/2022   ESBRIET 801 MG TABS Take 801 mg by mouth 3 (three) times daily with meals. 270 tablet 1 03/17/2022   fenofibrate (TRICOR) 48 MG tablet Take 48 mg by mouth at bedtime.   03/17/2022   fluticasone (FLOVENT HFA) 110 MCG/ACT inhaler INHALE 1 PUFF INTO THE LUNGS 2 TIMES A DAY. (Patient taking differently:  Inhale 1 puff into the lungs 2 (two) times daily.) 12 g 3 03/17/2022   losartan (COZAAR) 50 MG tablet Take 50 mg by mouth daily.   03/17/2022   metFORMIN (GLUCOPHAGE-XR) 500 MG 24 hr tablet Take 500 mg by mouth daily with breakfast.    03/17/2022   metoprolol succinate (TOPROL-XL) 50 MG 24 hr tablet Take 50 mg by mouth daily.    03/17/2022 at 1030   omeprazole (PRILOSEC) 20 MG capsule TAKE 1 CAPSULE BY MOUTH ONCE DAILY. (Patient taking differently: Take 20 mg by mouth daily.) 30 capsule 5 03/17/2022   Oxycodone HCl 10 MG TABS Take 10 mg by mouth daily as needed (severe pain).   03/16/2022   promethazine (PHENERGAN) 12.5 MG tablet Take 1 tablet (12.5 mg total) by mouth every 6 (six) hours as needed for nausea or vomiting. 30 tablet 0 unknown   REPATHA SURECLICK 161 MG/ML SOAJ Inject 1 mL into the skin every 14 (fourteen) days.   unknown   SAVELLA 50 MG TABS tablet Take 50 mg by mouth 2 (two) times daily.    03/17/2022   tiZANidine (ZANAFLEX) 4 MG tablet TAKE (1) TABLET EVERY NIGHT AT BEDTIME. (Patient taking differently: Take 4 mg by mouth at bedtime. TAKE (1) TABLET EVERY NIGHT AT BEDTIME.) 30 tablet 0 03/16/2022   ACCU-CHEK AVIVA PLUS test strip       Accu-Chek Softclix Lancets lancets       oxyCODONE-acetaminophen (PERCOCET) 7.5-325 MG tablet Take 1 tablet by mouth every 4 (four) hours as needed for severe pain. (Patient not taking:  Reported on 03/17/2022) 10 tablet 0 Not Taking   Scheduled:   budesonide (PULMICORT) nebulizer solution  0.25 mg Nebulization BID   Chlorhexidine Gluconate Cloth  6 each Topical Daily   clonazePAM  0.5 mg Oral QHS   fenofibrate  54 mg Oral QHS   insulin aspart  0-15 Units Subcutaneous TID WC   insulin aspart  0-5 Units Subcutaneous QHS   losartan  50 mg Oral Daily   metoprolol succinate  50 mg Oral Daily   Milnacipran  50 mg Oral BID   pantoprazole  40 mg Oral Daily   Pirfenidone  801 mg Oral TID WC   pneumococcal 20-valent conjugate vaccine  0.5 mL Intramuscular Tomorrow-1000   tiZANidine  4 mg Oral QHS   Infusions:   heparin 1,300 Units/hr (03/19/22 2249)    Assessment: Pt admitted with chest tightness and feeling like she "can't get enough air". Pt has pulmonary fibrosis, diabetes, HTN, and fibromyalgia with proximal left humerus fracture from fall last week. Pt only on aspirin 81 mg daily, no oral anticoagulant on admission. CTA show acute PE with moderate embolic burden and right heart strain. (RV/LV Ratio =1.47 )   Transition to po Eliquis  Goal of Therapy:  Heparin level 0.3-0.7 units/ml Monitor platelets by anticoagulation protocol: Yes   Plan:  D/C heparin Eliquis '10mg'$  po bid x 7 days, then '5mg'$  po bid Monitor for S/S of bleeding Educate on eliquis  Isac Sarna, BS Pharm D, BCPS Clinical Pharmacist

## 2022-03-20 NOTE — Discharge Instructions (Signed)
1) you need Eliquis/apixaban which is a blood thinner for the next 6 months for blood clot in your legs and blood clots in your lungs 2)Avoid ibuprofen/Advil/Aleve/Motrin/Goody Powders/Naproxen/BC powders/Meloxicam/Diclofenac/Indomethacin and other Nonsteroidal anti-inflammatory medications as these will make you more likely to bleed and can cause stomach ulcers, can also cause Kidney problems.  3) follow-up with orthopedic surgeon Dr. Aline Brochure as previously scheduled for your left upper extremity fracture 4) call or return if any bleeding concerns including nose bleed, dark stools, blood in the stool or blood in urine

## 2022-03-20 NOTE — Discharge Summary (Signed)
Nichole Reyes, is a 72 y.o. female  DOB March 29, 1950  MRN [id number].  Admission date:  03/17/2022  Admitting Physician  Truett Mainland, DO  Discharge Date:  03/20/2022   Primary MD  Lanelle Bal, PA-C  Recommendations for primary care physician for things to follow:   1) you need Eliquis/apixaban which is a blood thinner for the next 6 months for blood clot in your legs and blood clots in your lungs 2)Avoid ibuprofen/Advil/Aleve/Motrin/Goody Powders/Naproxen/BC powders/Meloxicam/Diclofenac/Indomethacin and other Nonsteroidal anti-inflammatory medications as these will make you more likely to bleed and can cause stomach ulcers, can also cause Kidney problems.  3) follow-up with orthopedic surgeon Dr. Aline Brochure as previously scheduled for your left upper extremity fracture 4) call or return if any bleeding concerns including nose bleed, dark stools, blood in the stool or blood in urine  Admission Diagnosis  Acute pulmonary embolism ([place]) [I26.99] Acute pulmonary embolism, unspecified pulmonary embolism type, unspecified whether acute cor pulmonale present ([place]) [I26.99]   Discharge Diagnosis  Acute pulmonary embolism ([place]) [I26.99] Acute pulmonary embolism, unspecified pulmonary embolism type, unspecified whether acute cor pulmonale present ([place]) [I26.99]    Principal Problem:   Acute pulmonary embolism (HCC) Active Problems:   Hyperlipidemia   Other sleep apnea   ILD (interstitial lung disease) (HCC)   Type 2 diabetes mellitus ([place])   Essential hypertension   Coronary artery calcification seen on CT scan      Past Medical History:  Diagnosis Date   Coronary artery calcification seen on CT scan    Essential hypertension    Fibromyalgia    Idiopathic pulmonary fibrosis (HCC)    Mixed hyperlipidemia    Osteoporosis    Type 2 diabetes mellitus ([place])     Past Surgical History:  Procedure  Laterality Date   ABDOMINAL HYSTERECTOMY  2007   CATARACT EXTRACTION Bilateral        HPI  from the history and physical done on the day of admission:    HPI: Nichole Reyes is a 72 y.o. female with medical history significant of coronary artery disease, essential hypertension, diabetes type 2, hyperlipidemia.  Patient seen for chest pain and shortness of breath that started this morning.  Patient was feeling very short of breath while moving around.  Denies fevers, chills, nausea, vomiting.   Review of Systems: As mentioned in the history of present illness. All other systems reviewed and are negative.   Hospital Course:   Brief Narrative:  72 y.o. female with medical history significant for HTN, DM2, HLD and CAD admitted on 03/17/22 with acute hypoxic respiratory failure secondary to acute PE -Patient was diagnosed with COVID around 02/10/2022 she was recovering from [place] when suddenly became short of breath around 03/16/2022, found to have acute PE     -Assessment and Plan: 1)Acute hypoxic respiratory failure secondary to acute PE-- -CTA chest consistent with Acute pulmonary embolism with moderate embolic burden. Positive for acute PE with CT evidence of right heart strain (RV/LV Ratio = 1.47 ) consistent with at  least submassive (intermediate risk) PE. -Lower extremity venous Dopplers with right leg thrombus -Echo with EF of 60 to 65%, no regional wall motion abnormalities,, unable to assess PA pressure, no aortic stenosis -Treated with IV heparin okay to transition to p.o. Eliquis,  -Hypoxia resolved,  -Patient has been successfully weaned off oxygen   2)Recent COVID-19 infection--- initially tested positive on 02/10/2022 -Shortness of breath most likely from acute PE rather than COVID per se -Supportive care   3)DM2-A1c 6.0 reflecting excellent diabetic control PTA -Okay to restart metformin   4)HLD-continue Repatha as outpatient   5)HTN--stable, continue Toprol-XL  50 mg daily, continue losartan -   6)GERD--stable, continue PPI   7) history of COPD/interstitial lung disease/pulmonary fibrosis ---  -PTA patient was on Pirfenidone for ILD/IPF, continue same -continue bronchodilators  8) right humerus fracture--continue to keep right upper extremity in sling -Discussed with Ortho who recommends outpatient follow-up -as per Ortho this is nonoperative -Follow-up with Dr. Aline Brochure as advised    Disposition: The patient is from: Home              Anticipated d/c is to: Home  Discharge Condition: Stable without hypoxia  Follow UP--orthopedic surgeon for right humerus fracture   Follow-up Information     Lanelle Bal, PA-C Follow up in 1 month(s).   Specialty: Family Medicine Contact information: [place] [address] [phone]         Carole Civil, MD. Go on 03/24/2022.   Specialties: Orthopedic Surgery, Radiology Contact information: [address] [place] [address] [phone]                  Consults obtained -orthopedics  Diet and Activity recommendation:  As advised  Discharge Instructions    Discharge Instructions     Call MD for:  difficulty breathing, headache or visual disturbances   Complete by: As directed    Call MD for:  persistant dizziness or light-headedness   Complete by: As directed    Call MD for:  persistant nausea and vomiting   Complete by: As directed    Call MD for:  temperature >100.4   Complete by: As directed    Diet - low sodium heart healthy   Complete by: As directed    Diet Carb Modified   Complete by: As directed    Discharge instructions   Complete by: As directed    1) you need Eliquis/apixaban which is a blood thinner for the next 6 months for blood clot in your legs and blood clots in your lungs 2)Avoid ibuprofen/Advil/Aleve/Motrin/Goody Powders/Naproxen/BC powders/Meloxicam/Diclofenac/Indomethacin and other Nonsteroidal anti-inflammatory  medications as these will make you more likely to bleed and can cause stomach ulcers, can also cause Kidney problems.  3) follow-up with orthopedic surgeon Dr. Aline Brochure as previously scheduled for your left upper extremity fracture 4) call or return if any bleeding concerns including nose bleed, dark stools, blood in the stool or blood in urine   Increase activity slowly   Complete by: As directed          Discharge Medications     Allergies as of 03/20/2022       Reactions   Latex    Pseudoephedrine Other (See Comments)   Rash and hallucinations    Sulfa Antibiotics    Sulfonamide Derivatives Rash        Medication List     STOP taking these medications    aspirin EC 81 MG tablet  TAKE these medications    Accu-Chek Aviva Plus test strip Generic drug: glucose blood   Accu-Chek Softclix Lancets lancets   albuterol 108 (90 Base) MCG/ACT inhaler Commonly known as: VENTOLIN HFA Inhale 2 puffs into the lungs every 4 (four) hours as needed for wheezing or shortness of breath.   CALTRATE 600 PO Take 1 tablet by mouth 2 (two) times a day.   clonazePAM 0.5 MG tablet Commonly known as: KLONOPIN Take 0.5 mg by mouth at bedtime.   cyanocobalamin 1000 MCG tablet Commonly known as: VITAMIN B12 Take 1,000 mcg by mouth daily.   Eliquis DVT/PE Starter Pack Generic drug: Apixaban Starter Pack ('10mg'$  and '5mg'$ ) Take as directed on package: start with two-'5mg'$  tablets twice daily for 7 days. On day 8, switch to one-'5mg'$  tablet twice daily.   apixaban 5 MG Tabs tablet Commonly known as: ELIQUIS Take 1 tablet (5 mg total) by mouth 2 (two) times daily. Start in 1 month after completing initial Starter pack Start taking on: April 20, 2022   Esbriet 801 MG Tabs Generic drug: Pirfenidone Take 801 mg by mouth 3 (three) times daily with meals.   fenofibrate 48 MG tablet Commonly known as: TRICOR Take 48 mg by mouth at bedtime.   Flovent HFA 110 MCG/ACT inhaler Generic  drug: fluticasone INHALE 1 PUFF INTO THE LUNGS 2 TIMES A DAY. What changed: See the new instructions.   losartan 50 MG tablet Commonly known as: COZAAR Take 50 mg by mouth daily.   metFORMIN 500 MG 24 hr tablet Commonly known as: GLUCOPHAGE-XR Take 500 mg by mouth daily with breakfast.   metoprolol succinate 50 MG 24 hr tablet Commonly known as: TOPROL-XL Take 50 mg by mouth daily.   omeprazole 20 MG capsule Commonly known as: PRILOSEC TAKE 1 CAPSULE BY MOUTH ONCE DAILY.   Oxycodone HCl 10 MG Tabs Take 10 mg by mouth daily as needed (severe pain).   oxyCODONE-acetaminophen 7.5-325 MG tablet Commonly known as: Percocet Take 1 tablet by mouth every 4 (four) hours as needed for severe pain.   promethazine 12.5 MG tablet Commonly known as: PHENERGAN Take 1 tablet (12.5 mg total) by mouth every 6 (six) hours as needed for nausea or vomiting.   Repatha SureClick 322 MG/ML [place] Generic drug: Evolocumab Inject 1 mL into the skin every 14 (fourteen) days.   Savella 50 MG Tabs tablet Generic drug: Milnacipran Take 50 mg by mouth 2 (two) times daily.   tiZANidine 4 MG tablet Commonly known as: ZANAFLEX TAKE (1) TABLET EVERY NIGHT AT BEDTIME. What changed:  how much to take how to take this when to take this        Major procedures and Radiology Reports - PLEASE review detailed and final reports for all details, in brief -   US Venous Img Lower Bilateral (DVT)  Result Date: 03/19/2022 CLINICAL DATA:  72 year old female with history of pulmonary embolism. EXAM: BILATERAL LOWER EXTREMITY VENOUS DOPPLER ULTRASOUND TECHNIQUE: Gray-scale sonography with graded compression, as well as color Doppler and duplex ultrasound were performed to evaluate the lower extremity deep venous systems from the level of the common femoral vein and including the common femoral, femoral, profunda femoral, popliteal and calf veins including the posterior tibial, peroneal and gastrocnemius veins  when visible. The superficial great saphenous vein was also interrogated. Spectral Doppler was utilized to evaluate flow at rest and with distal augmentation maneuvers in the common femoral, femoral and popliteal veins. COMPARISON:  None Available. FINDINGS: RIGHT LOWER EXTREMITY Common  Femoral Vein: No evidence of thrombus. Normal compressibility, respiratory phasicity and response to augmentation. Saphenofemoral Junction: No evidence of thrombus. Normal compressibility and flow on color Doppler imaging. Profunda Femoral Vein: No evidence of thrombus. Normal compressibility and flow on color Doppler imaging. Femoral Vein: No evidence of thrombus. Normal compressibility, respiratory phasicity and response to augmentation. Popliteal Vein: No evidence of thrombus. Normal compressibility, respiratory phasicity and response to augmentation. Calf Veins: Acute, heterogeneously hypoechoic, expansile occlusive thrombus within the gastrocnemius vein. The posterior tibial and peroneal veins are patent. Other Findings:  None. LEFT LOWER EXTREMITY Common Femoral Vein: No evidence of thrombus. Normal compressibility, respiratory phasicity and response to augmentation. Saphenofemoral Junction: No evidence of thrombus. Normal compressibility and flow on color Doppler imaging. Profunda Femoral Vein: No evidence of thrombus. Normal compressibility and flow on color Doppler imaging. Femoral Vein: No evidence of thrombus. Normal compressibility, respiratory phasicity and response to augmentation. Popliteal Vein: No evidence of thrombus. Normal compressibility, respiratory phasicity and response to augmentation. Calf Veins: No evidence of thrombus. Normal compressibility and flow on color Doppler imaging. Other Findings:  None. IMPRESSION: 1. Acute deep vein thrombosis limited to the right gastrocnemius calf vein. No evidence of iliofemoral thrombus. 2. No evidence of left lower extremity deep vein thrombosis. Nichole Cancer, MD [facility] Radiology Electronically Signed   By: Nichole Reyes M.D.   On: 03/19/2022 19:46   ECHOCARDIOGRAM LIMITED  Result Date: 03/19/2022    ECHOCARDIOGRAM LIMITED REPORT   Patient Name:   Nichole Reyes Date of Exam: 03/19/2022 Medical Rec #:  [id number]         Height:       65.0 in Accession #:    [id number]        Weight:       158.5 lb Date of Birth:  07-06-1950         BSA:          1.792 m Patient Age:    40 years          BP:           114/93 mmHg Patient Gender: F                 HR:           88 bpm. Exam Location:  [facility] Procedure: Limited Echo and Limited Color Doppler Indications:    Pulmonary Embolus I26.09  History:        Patient has prior history of Echocardiogram examinations, most                 recent 10/31/2021. Risk Factors:Hypertension, Diabetes and                 Dyslipidemia. Positive for COVID-19 at this time.  Sonographer:    Alvino Chapel RCS Referring Phys: [id number] [facility]  Sonographer Comments: Patient has spiral left humerus fracture. Can NOT move arm at all. Could not obtain apical images. Very limited 2-D Echocardiogram. IMPRESSIONS  1. Left ventricular ejection fraction, by estimation, is 60 to 65%. The left ventricle has normal function. There is mild left ventricular hypertrophy.  2. Right ventricular systolic function is normal. The right ventricular size is normal. Tricuspid regurgitation signal is inadequate for assessing PA pressure.  3. No evidence of mitral valve regurgitation.  4. Aortic valve sclerosis/calcification is present, without any evidence of aortic stenosis.  5. The inferior vena cava is normal in size with greater than 50% respiratory  variability, suggesting right atrial pressure of 3 mmHg. Comparison(s): No significant change from prior study. FINDINGS  Left Ventricle: Left ventricular ejection fraction, by estimation, is 60 to 65%. The left ventricle has normal function. There is mild left  ventricular hypertrophy. Right Ventricle: The right ventricular size is normal. Right ventricular systolic function is normal. Tricuspid regurgitation signal is inadequate for assessing PA pressure. Left Atrium: Left atrial size was normal in size. Right Atrium: Right atrial size was normal in size. Pericardium: There is no evidence of pericardial effusion. Tricuspid Valve: Tricuspid valve regurgitation is not demonstrated. Aortic Valve: Aortic valve sclerosis/calcification is present, without any evidence of aortic stenosis. Pulmonic Valve: Pulmonic valve regurgitation is not visualized. Aorta: The aortic root and ascending aorta are structurally normal, with no evidence of dilitation. Venous: The inferior vena cava is normal in size with greater than 50% respiratory variability, suggesting right atrial pressure of 3 mmHg. IAS/Shunts: No atrial level shunt detected by color flow Doppler. LEFT VENTRICLE PLAX 2D LVIDd:         3.90 cm LVIDs:         2.50 cm LV PW:         1.00 cm LV IVS:        1.20 cm LVOT diam:     1.90 cm LVOT Area:     2.84 cm  LEFT ATRIUM         Index LA diam:    3.20 cm 1.79 cm/m   AORTA Ao Root diam: 3.40 cm  SHUNTS Systemic Diam: 1.90 cm Phineas Inches Electronically signed by Phineas Inches Signature Date/Time: 03/19/2022/5:06:53 PM    Final    CT Angio Chest PE W and/or Wo Contrast  Addendum Date: 03/17/2022   ADDENDUM REPORT: 03/17/2022 17:37 ADDENDUM: These results were called by telephone at the time of interpretation on 03/17/2022 at 5:37 pm to provider Roderic Palau, MD, who verbally acknowledged these results. Electronically Signed   By: Fidela Salisbury M.D.   On: 03/17/2022 17:37   Result Date: 03/17/2022 CLINICAL DATA:  Pulmonary embolism (PE) suspected, high prob. COVID pneumonia, chest pain EXAM: CT ANGIOGRAPHY CHEST WITH CONTRAST TECHNIQUE: Multidetector CT imaging of the chest was performed using the standard protocol during bolus administration of intravenous contrast. Multiplanar  CT image reconstructions and MIPs were obtained to evaluate the vascular anatomy. RADIATION DOSE REDUCTION: This exam was performed according to the departmental dose-optimization program which includes automated exposure control, adjustment of the mA and/or kV according to patient size and/or use of iterative reconstruction technique. CONTRAST:  89m OMNIPAQUE IOHEXOL 350 MG/ML SOLN COMPARISON:  05/31/2020 FINDINGS: Cardiovascular: Multiple branching intraluminal filling defects are seen within the lobar and segmental pulmonary arteries bilaterally in keeping with acute pulmonary embolism. The overall embolic burden is moderate. The central pulmonary arteries are of normal caliber. However, there is dilation of the right ventricle with reversal of the normal RV/LV ratio which is approximally 1.47. Global cardiac size is within normal limits. Moderate coronary artery calcification. No pericardial effusion. Mild atherosclerotic calcification within the thoracic aorta. No aortic aneurysm. Mediastinum/Nodes: Single borderline enlarged infrahilar lymph node measures 15 mm in short axis diameter, minimally enlarged since prior examination where this measured 12 mm. This is nonspecific. No other pathologic adenopathy within the thorax. The visualized thyroid is unremarkable. The esophagus is unremarkable. Lungs/Pleura: Subpleural pulmonary nodule within the right middle lobe, axial image # 80/6, measuring 8 mm in mean diameter demonstrates minimal enlargement when compared to prior examination. Subpleural nodule within the right  lower lobe, axial image # 64/6, measuring 13 mm in mean diameter is stable since prior examination. Subpleural pulmonary fibrosis demonstrating a basilar gradient is again identified and appears stable since prior examination. No pneumothorax or pleural effusion. No central obstructing lesion. Upper Abdomen: No acute abnormality. Musculoskeletal: Acute fracture of the left humeral head is seen  with fracture planes involving the greater tuberosity and surgical neck of the humerus is again seen, better seen on CT examination of 03/09/2022. No other fracture identified. Osseous structures are otherwise age-appropriate. Review of the MIP images confirms the above findings. IMPRESSION: 1. Acute pulmonary embolism with moderate embolic burden. Positive for acute PE with CT evidence of right heart strain (RV/LV Ratio = 1.47 ) consistent with at least submassive (intermediate risk) PE. The presence of right heart strain has been associated with an increased risk of morbidity and mortality. Please refer to the "Code PE Focused" order set in EPIC. 2. Moderate coronary artery calcification. 3. Stable subpleural pulmonary fibrosis demonstrating a basilar gradient. 4. Acute fracture of the left humeral head with fracture planes involving the greater tuberosity and surgical neck of the humerus, better seen on CT examination of 03/09/2022. 5. Multiple pulmonary nodules. Evaluation is slightly limited due to motion artifact and I suspect these nodules are likely stable though repeat evaluation with nonemergent CT examination of the chest is recommended once the patient's acute issues have resolved. Aortic Atherosclerosis (ICD10-I70.0). Electronically Signed: By: Fidela Salisbury M.D. On: 03/17/2022 17:32   DG Chest 2 View  Result Date: 03/17/2022 CLINICAL DATA:  Chest pain and shortness of breath EXAM: CHEST - 2 VIEW COMPARISON:  CT 05/31/2020. FINDINGS: Lateral view degraded by patient arm position. Patient rotated to the right on the frontal, which is reverse apical lordotic in positioning. Mild cardiomegaly. Atherosclerosis in the transverse aorta. Mild right hemidiaphragm elevation. No pleural effusion or pneumothorax. Peripheral and basilar predominant interstitial thickening likely related to the clinical history of interstitial lung disease detailed on the prior CT. This is greater right than left. No evidence  of superimposed lobar consolidation or congestive failure. IMPRESSION: Asymmetric peripheral and basilar predominant interstitial thickening is likely related to the interstitial lung disease detailed on 05/31/2020 CT. No convincing evidence of acute superimposed process. Given asymmetry, a subtle or early right lower lobe pneumonia cannot be entirely excluded. Cardiomegaly without congestive failure. Aortic Atherosclerosis (ICD10-I70.0). Electronically Signed   By: Abigail Miyamoto M.D.   On: 03/17/2022 14:36   CT Shoulder Left Wo Contrast  Result Date: 03/09/2022 CLINICAL DATA:  Fall. EXAM: CT OF THE UPPER LEFT EXTREMITY WITHOUT CONTRAST TECHNIQUE: Multidetector CT imaging of the upper left extremity was performed according to the standard protocol. RADIATION DOSE REDUCTION: This exam was performed according to the departmental dose-optimization program which includes automated exposure control, adjustment of the mA and/or kV according to patient size and/or use of iterative reconstruction technique. COMPARISON:  Left shoulder x-rays from same day. FINDINGS: Bones/Joint/Cartilage Acute oblique fracture of the proximal humerus surgical neck with 1.2 cm anterior displacement and 1.7 cm of overriding. Nondisplaced longitudinal component extending into the greater tuberosity. No involvement of the lesser tuberosity. No additional fracture. No dislocation. Joint spaces are preserved. No significant joint effusion. Ligaments Ligaments are suboptimally evaluated by CT. Muscles and Tendons Grossly intact.  No muscle atrophy. Soft tissue Soft tissue swelling around the fracture extending into the axilla. No fluid collection or hematoma. No soft tissue mass. IMPRESSION: 1. Acute displaced and impacted fracture of proximal humerus surgical neck, with  nondisplaced extension into the greater tuberosity. Electronically Signed   By: Titus Dubin M.D.   On: 03/09/2022 17:02   DG Shoulder Left  Result Date:  03/09/2022 CLINICAL DATA:  Trauma, fall, pain EXAM: LEFT SHOULDER - 2+ VIEW COMPARISON:  None Available. FINDINGS: Study is technically less than optimal In the AP supine image, there is angulation in the neck of the proximal left humerus. There is no definite demonstrable fracture line. There is decreased distance between humeral head and acromion. In the lateral view, proximal humerus is not visualized for evaluation. IMPRESSION: Technically limited examination. There is angulation in the neck of the left humerus. The study is inconclusive to evaluate for fracture or dislocation. Repeat radiographic examination or CT may be considered for further evaluation. Electronically Signed   By: Elmer Picker M.D.   On: 03/09/2022 13:48    Micro Results   Recent Results (from the past 240 hour(s))  Resp panel by RT-PCR (RSV, Flu A&B, Covid) Anterior Nasal Swab     Status: Abnormal   Collection Time: 03/17/22  2:05 PM   Specimen: Anterior Nasal Swab  Result Value Ref Range Status   SARS Coronavirus 2 by RT PCR POSITIVE (A) NEGATIVE Final    Comment: (NOTE) SARS-CoV-2 target nucleic acids are DETECTED.  The SARS-CoV-2 RNA is generally detectable in upper respiratory specimens during the acute phase of infection. Positive results are indicative of the presence of the identified virus, but do not rule out bacterial infection or co-infection with other pathogens not detected by the test. Clinical correlation with patient history and other diagnostic information is necessary to determine patient infection status. The expected result is Negative.  Fact Sheet for Patients: EntrepreneurPulse.com.au  Fact Sheet for Healthcare Providers: IncredibleEmployment.be  This test is not yet approved or cleared by the [place] FDA and  has been authorized for detection and/or diagnosis of SARS-CoV-2 by FDA under an Emergency Use Authorization (EUA).  This EUA  will remain in effect (meaning this test can be used) for the duration of  the COVID-19 declaration under Section 564(b)(1) of the A ct, 21 U.S.C. section 360bbb-3(b)(1), unless the authorization is terminated or revoked sooner.     Influenza A by PCR NEGATIVE NEGATIVE Final   Influenza B by PCR NEGATIVE NEGATIVE Final    Comment: (NOTE) The Xpert Xpress SARS-CoV-2/FLU/RSV plus assay is intended as an aid in the diagnosis of influenza from Nasopharyngeal swab specimens and should not be used as a sole basis for treatment. Nasal washings and aspirates are unacceptable for Xpert Xpress SARS-CoV-2/FLU/RSV testing.  Fact Sheet for Patients: EntrepreneurPulse.com.au  Fact Sheet for Healthcare Providers: IncredibleEmployment.be  This test is not yet approved or cleared by the [place] FDA and has been authorized for detection and/or diagnosis of SARS-CoV-2 by FDA under an Emergency Use Authorization (EUA). This EUA will remain in effect (meaning this test can be used) for the duration of the COVID-19 declaration under Section 564(b)(1) of the Act, 21 U.S.C. section 360bbb-3(b)(1), unless the authorization is terminated or revoked.     Resp Syncytial Virus by PCR NEGATIVE NEGATIVE Final    Comment: (NOTE) Fact Sheet for Patients: EntrepreneurPulse.com.au  Fact Sheet for Healthcare Providers: IncredibleEmployment.be  This test is not yet approved or cleared by the [place] FDA and has been authorized for detection and/or diagnosis of SARS-CoV-2 by FDA under an Emergency Use Authorization (EUA). This EUA will remain in effect (meaning this test can be used) for the duration of the COVID-19  declaration under Section 564(b)(1) of the Act, 21 U.S.C. section 360bbb-3(b)(1), unless the authorization is terminated or revoked.  Performed at [facility], [address] [place], [place] [address]   MRSA  Next Gen by PCR, Nasal     Status: None   Collection Time: 03/18/22  3:47 PM   Specimen: Nasal Mucosa; Nasal Swab  Result Value Ref Range Status   MRSA by PCR Next Gen NOT DETECTED NOT DETECTED Final    Comment: (NOTE) The GeneXpert MRSA Assay (FDA approved for NASAL specimens only), is one component of a comprehensive MRSA colonization surveillance program. It is not intended to diagnose MRSA infection nor to guide or monitor treatment for MRSA infections. Test performance is not FDA approved in patients less than 67 years old. Performed at [facility], [address] [place], [place] [address]     Today   Subjective    Cai Flott today has no new complaints No fever  Or chills   No Nausea, Vomiting or Diarrhea -- Ambulated around without dyspnea on exertion no chest pains or palpitations no dizziness O2 sats stable post ambulation            Patient has been seen and examined prior to discharge   Objective   Blood pressure (!) 146/67, pulse 87, temperature 98.5 F (36.9 C), temperature source Oral, resp. rate 13, height '5\' 5"'$  (1.651 m), weight 71.9 kg, SpO2 95 %.   Intake/Output Summary (Last 24 hours) at 03/20/2022 1410 Last data filed at 03/20/2022 1100 Gross per 24 hour  Intake 535.8 ml  Output --  Net 535.8 ml    Exam Gen:- Awake Alert, in no acute distress, no conversational dyspnea  HEENT:- [place].AT, No sclera icterus Neck-Supple Neck,No JVD,.  Lungs-overall much improved air movement, no wheezing or rales  CV- S1, S2 normal, regular  Abd-  +ve B.Sounds, Abd Soft, No tenderness,    Extremity/Skin:- No  edema, pedal pulses present  Psych-affect is appropriate, oriented x3 Neuro-no new focal deficits, no tremors MSK-right upper extremity in the sling, no neurovascular compromise concerns good cap refill good radial pulse   Data Review   CBC w Diff:  Lab Results  Component Value Date   WBC 9.5 03/20/2022   HGB 11.3 (L) 03/20/2022   HCT 34.9 (L)  03/20/2022   PLT 353 03/20/2022   LYMPHOPCT 30 06/28/2016   MONOPCT 8.6 03/15/2017   EOSPCT 1.9 03/15/2017   BASOPCT 1.4 03/15/2017    CMP:  Lab Results  Component Value Date   NA 139 03/18/2022   K 3.5 03/18/2022   CL 107 03/18/2022   CO2 23 03/18/2022   BUN 12 03/18/2022   CREATININE 0.56 03/18/2022   CREATININE 0.73 03/15/2017   PROT 7.6 11/29/2021   ALBUMIN 4.1 11/29/2021   BILITOT 0.3 11/29/2021   ALKPHOS 75 02/28/2022   AST 19 11/29/2021   ALT 18 11/29/2021  .  Total Discharge time is about 33 minutes  Roxan Hockey M.D on 03/20/2022 at 2:10 PM  Go to [email or web address] -  for contact info  [facility] - Office  [phone]

## 2022-03-20 NOTE — Care Management Important Message (Signed)
Important Message  Patient Details  Name: Nichole Reyes MRN: [id number] Date of Birth: [birth year]   Medicare Important Message Given:  N/A - LOS <3 / Initial given by admissions     Dannette Barbara 03/20/2022, 2:27 PM

## 2022-03-24 ENCOUNTER — Ambulatory Visit: Payer: Medicare Other | Admitting: Orthopedic Surgery

## 2022-03-24 ENCOUNTER — Ambulatory Visit (INDEPENDENT_AMBULATORY_CARE_PROVIDER_SITE_OTHER): Payer: Medicare Other

## 2022-03-24 ENCOUNTER — Encounter: Payer: Self-pay | Admitting: Orthopedic Surgery

## 2022-03-24 DIAGNOSIS — S42292A Other displaced fracture of upper end of left humerus, initial encounter for closed fracture: Secondary | ICD-10-CM

## 2022-03-24 DIAGNOSIS — S42295A Other nondisplaced fracture of upper end of left humerus, initial encounter for closed fracture: Secondary | ICD-10-CM

## 2022-03-24 NOTE — Progress Notes (Unsigned)
Chief Complaint  Patient presents with   Shoulder Injury    LT humerus DOI 03/09/22  fell while walking dog   The patient is following up approximately 15 days after proximal humerus humerus fracture left shoulder  Pain is much better controlled other than 1 night where she had to take an OxyContin  Otherwise she is doing well  She was able to take a shower with her daughter's assistance  Her x-ray shows that the fracture is stable with some displacement and angulation but within criteria for nonoperative treatment  We have recommended that she start physical therapy and present back to [place] in about 8 weeks

## 2022-03-27 ENCOUNTER — Other Ambulatory Visit: Payer: Self-pay | Admitting: Internal Medicine

## 2022-03-27 DIAGNOSIS — J849 Interstitial pulmonary disease, unspecified: Secondary | ICD-10-CM

## 2022-03-27 DIAGNOSIS — R0609 Other forms of dyspnea: Secondary | ICD-10-CM

## 2022-04-03 ENCOUNTER — Other Ambulatory Visit: Payer: Self-pay | Admitting: Physician Assistant

## 2022-04-03 NOTE — Telephone Encounter (Signed)
Next Visit: 07/18/2022   Last Visit: 01/10/2022   Last Fill: 03/02/2022   Dx: Chronic pain of both hips    Current Dose per office note on 01/10/2022: not mentioned   Okay to refill Zanaflex?

## 2022-04-05 DIAGNOSIS — S49092A Other physeal fracture of upper end of humerus, left arm, initial encounter for closed fracture: Secondary | ICD-10-CM | POA: Diagnosis not present

## 2022-04-06 DIAGNOSIS — S42302A Unspecified fracture of shaft of humerus, left arm, initial encounter for closed fracture: Secondary | ICD-10-CM | POA: Diagnosis not present

## 2022-04-06 DIAGNOSIS — I2699 Other pulmonary embolism without acute cor pulmonale: Secondary | ICD-10-CM | POA: Diagnosis not present

## 2022-04-07 DIAGNOSIS — S49092A Other physeal fracture of upper end of humerus, left arm, initial encounter for closed fracture: Secondary | ICD-10-CM | POA: Diagnosis not present

## 2022-04-10 DIAGNOSIS — S49092A Other physeal fracture of upper end of humerus, left arm, initial encounter for closed fracture: Secondary | ICD-10-CM | POA: Diagnosis not present

## 2022-04-11 ENCOUNTER — Ambulatory Visit: Payer: Medicare Other | Attending: Internal Medicine | Admitting: Nurse Practitioner

## 2022-04-11 ENCOUNTER — Encounter: Payer: Self-pay | Admitting: Nurse Practitioner

## 2022-04-11 VITALS — BP 130/68 | HR 68 | Ht 65.0 in | Wt 157.8 lb

## 2022-04-11 DIAGNOSIS — I2699 Other pulmonary embolism without acute cor pulmonale: Secondary | ICD-10-CM

## 2022-04-11 DIAGNOSIS — E782 Mixed hyperlipidemia: Secondary | ICD-10-CM

## 2022-04-11 DIAGNOSIS — I2510 Atherosclerotic heart disease of native coronary artery without angina pectoris: Secondary | ICD-10-CM

## 2022-04-11 DIAGNOSIS — R0602 Shortness of breath: Secondary | ICD-10-CM | POA: Diagnosis not present

## 2022-04-11 DIAGNOSIS — I82409 Acute embolism and thrombosis of unspecified deep veins of unspecified lower extremity: Secondary | ICD-10-CM

## 2022-04-11 DIAGNOSIS — J84112 Idiopathic pulmonary fibrosis: Secondary | ICD-10-CM

## 2022-04-11 DIAGNOSIS — I10 Essential (primary) hypertension: Secondary | ICD-10-CM | POA: Diagnosis not present

## 2022-04-11 DIAGNOSIS — Z79899 Other long term (current) drug therapy: Secondary | ICD-10-CM

## 2022-04-11 NOTE — Patient Instructions (Signed)
Medication Instructions:  Continue all current medications.   Labwork: CBC, BMET, BNP - orders given today   Testing/Procedures: Your physician has requested that you have an echocardiogram. Echocardiography is a painless test that uses sound waves to create images of your heart. It provides your doctor with information about the size and shape of your heart and how well your heart's chambers and valves are working. This procedure takes approximately one hour. There are no restrictions for this procedure. Please do NOT wear cologne, perfume, aftershave, or lotions (deodorant is allowed). Please arrive 15 minutes prior to your appointment time.   Follow-Up: Office will contact with results via phone, letter or mychart.    1 week   Any Other Special Instructions Will Be Listed Below (If Applicable).   If you need a refill on your cardiac medications before your next appointment, please call your pharmacy.

## 2022-04-11 NOTE — Progress Notes (Unsigned)
Cardiology Office Note:    Date:  04/11/2022  ID:  Nichole Reyes, DOB 1950/07/03, MRN [id number]  PCP:  Lanelle Bal, [place] [facility] Cardiologist:  Rozann Lesches, MD     Referring MD: [facility]   CC: Here for follow-up  History of Present Illness:    Nichole Reyes is a 72 y.o. female with a hx of the following:  Coronary artery calcification noted on CT scan History of shortness of breath Mixed hyperlipidemia, statin intolerance Hypertension Idiopathic pulmonary fibrosis (follows pulmonary) Type 2 diabetes Hx of DVT/PE  Patient is a 72 year old female with past medical history as mentioned above.  NST at [facility] in 2020 was negative for ischemia.  Last seen by Dr. Domenic Polite on September 14, 2020.  Denied any chest pain or palpitations.  Echocardiogram in April 2022 was reviewed and showed normal EF, mild diastolic dysfunction, mildly sclerotic aortic valve without stenosis.  EKG and office showed sinus rhythm, LVH, decreased R wave progression.  Myoview was ordered and arranged to evaluate ischemic burden, this was normal, low risk and negative for ischemia.  Dr. Domenic Polite discussed referral to lipid clinic potentially for evaluation of PCSK9 inhibitor and other treatment options.  No medication changes were made.  Was told to follow-up based on test results.  Had a fall and was seen at [facility] on March 09, 2022. This was a mechanical fall from her dog causing her to fall.  Was not on blood thinners at the time.  CT imaging showed oblique fracture of the proximal humeral neck with anterior displacement.  Was told to follow-up with Dr. Aline Brochure, orthopedic surgery, outpatient.  Was discharged on Percocet.  Was diagnosed with COVID around Thanksgiving 2023.after recovering from [place], she was found to be short of breath and found to have acute PE.  In the interim, she was admitted at [facility] for treatment of acute PE.  CTA chest  consistent with acute PE with moderate embolic burden, positive for acute PE with CT evidence of right heart strain, consistent with at least submassive PE.  Lower extremity venous Dopplers with right leg thrombus noted.  Echocardiogram revealed normal EF, no RWMA, unable to assess PA pressure, no aortic stenosis.  Treated with IV heparin and transition to p.o. Eliquis.  She was successfully weaned off oxygen.  She is here for follow-up. Says she is doing well, breathing is better but still notices dyspnea with exertion and also at rest while talking to me during interview. Compliant with her medications and tolerating them well. Denies any chest pain, palpitations, syncope, presyncope, dizziness, orthopnea, PND, swelling or significant weight changes, acute bleeding, or claudication. Has upcoming appts with hematology and lipid clinic. Denies any other questions or concerns today.   Past Medical History:  Diagnosis Date   Coronary artery calcification seen on CT scan    Essential hypertension    Fibromyalgia    Idiopathic pulmonary fibrosis (HCC)    Mixed hyperlipidemia    Osteoporosis    Type 2 diabetes mellitus ([place])     Past Surgical History:  Procedure Laterality Date   ABDOMINAL HYSTERECTOMY  2007   CATARACT EXTRACTION Bilateral     Current Medications: Current Meds  Medication Sig   ACCU-CHEK AVIVA PLUS test strip    Accu-Chek Softclix Lancets lancets    albuterol (VENTOLIN HFA) 108 (90 Base) MCG/ACT inhaler Inhale 2 puffs into the lungs every 4 (four) hours as needed for wheezing or shortness of  breath.   [START ON 04/20/2022] apixaban (ELIQUIS) 5 MG TABS tablet Take 1 tablet (5 mg total) by mouth 2 (two) times daily. Start in 1 month after completing initial Starter pack   Calcium Carbonate (CALTRATE 600 PO) Take 1 tablet by mouth 2 (two) times a day.   clonazePAM (KLONOPIN) 0.5 MG tablet Take 0.5 mg by mouth at bedtime.   cyanocobalamin (VITAMIN B12) 1000 MCG tablet Take 1,000  mcg by mouth daily.   ESBRIET 801 MG TABS Take 801 mg by mouth 3 (three) times daily with meals.   fenofibrate (TRICOR) 48 MG tablet Take 48 mg by mouth at bedtime.   FLOVENT HFA 110 MCG/ACT inhaler INHALE 1 PUFF INTO THE LUNGS 2 TIMES A DAY.   losartan (COZAAR) 50 MG tablet Take 50 mg by mouth daily.   metFORMIN (GLUCOPHAGE-XR) 500 MG 24 hr tablet Take 500 mg by mouth daily with breakfast.    metoprolol succinate (TOPROL-XL) 50 MG 24 hr tablet Take 50 mg by mouth daily.    omeprazole (PRILOSEC) 20 MG capsule TAKE 1 CAPSULE BY MOUTH ONCE DAILY. (Patient taking differently: Take 20 mg by mouth daily.)   REPATHA SURECLICK 174 MG/ML SOAJ Inject 1 mL into the skin every 14 (fourteen) days.   SAVELLA 50 MG TABS tablet Take 50 mg by mouth 2 (two) times daily.    tiZANidine (ZANAFLEX) 4 MG tablet TAKE 1 TABLET BY MOUTH AT BEDTIME.     Allergies:   Latex, Pseudoephedrine, Sulfa antibiotics, and Sulfonamide derivatives   Social History   Socioeconomic History   Marital status: Married    Spouse name: Not on file   Number of children: Not on file   Years of education: Not on file   Highest education level: Not on file  Occupational History   Not on file  Tobacco Use   Smoking status: Never    Passive exposure: Past   Smokeless tobacco: Never  Vaping Use   Vaping Use: Never used  Substance and Sexual Activity   Alcohol use: Not Currently    Comment: ocassional   Drug use: No   Sexual activity: Not on file  Other Topics Concern   Not on file  Social History Narrative   Not on file   Social Determinants of Health   Financial Resource Strain: Not on file  Food Insecurity: No Food Insecurity (03/18/2022)   Hunger Vital Sign    Worried About Running Out of Food in the Last Year: Never true    Ran Out of Food in the Last Year: Never true  Transportation Needs: No Transportation Needs (03/18/2022)   PRAPARE - Hydrologist (Medical): No    Lack of  Transportation (Non-Medical): No  Physical Activity: Not on file  Stress: Not on file  Social Connections: Not on file     Family History: The patient's family history includes COPD in her father; Cancer in her brother; Colon cancer in her sister; Diabetes in her mother; Emphysema in her paternal grandfather; Heart disease in her brother, brother, and father; Hypertension in her father and mother; Osteoporosis in her mother.  ROS:   Review of Systems  Constitutional: Negative.   HENT: Negative.    Eyes: Negative.   Respiratory:  Positive for shortness of breath. Negative for cough, hemoptysis, sputum production and wheezing.   Cardiovascular: Negative.   Gastrointestinal: Negative.   Genitourinary: Negative.   Musculoskeletal: Negative.   Skin: Negative.   Neurological: Negative.  Endo/Heme/Allergies: Negative.   Psychiatric/Behavioral: Negative.      Please see the history of present illness.    All other systems reviewed and are negative.  EKGs/Labs/Other Studies Reviewed:    The following studies were reviewed today:   EKG:  EKG is not ordered today.    Limited Echo on 03/19/2022: 1. Left ventricular ejection fraction, by estimation, is 60 to 65%. The  left ventricle has normal function. There is mild left ventricular  hypertrophy.   2. Right ventricular systolic function is normal. The right ventricular  size is normal. Tricuspid regurgitation signal is inadequate for assessing  PA pressure.   3. No evidence of mitral valve regurgitation.   4. Aortic valve sclerosis/calcification is present, without any evidence  of aortic stenosis.   5. The inferior vena cava is normal in size with greater than 50%  respiratory variability, suggesting right atrial pressure of 3 mmHg.   Comparison(s): No significant change from prior study.  Venous ultrasound of bilateral lower extremities on March 19, 2022: IMPRESSION: 1. Acute deep vein thrombosis limited to the right  gastrocnemius calf vein. No evidence of iliofemoral thrombus. 2. No evidence of left lower extremity deep vein thrombosis.  Complete echocardiogram on October 31, 2021: 1. Left ventricular ejection fraction, by estimation, is 60 to 65%. The  left ventricle has normal function. The left ventricle has no regional  wall motion abnormalities. There is mild left ventricular hypertrophy.  Left ventricular diastolic parameters  are consistent with Grade I diastolic dysfunction (impaired relaxation).   2. Right ventricular systolic function is normal. The right ventricular  size is normal. Tricuspid regurgitation signal is inadequate for assessing  PA pressure.   3. Left atrial size was mildly dilated.   4. The mitral valve is grossly normal. Trivial mitral valve  regurgitation.   5. The aortic valve is tricuspid. Aortic valve regurgitation is not  visualized. Aortic valve sclerosis is present, with no evidence of aortic  valve stenosis.   6. The inferior vena cava is normal in size with greater than 50%  respiratory variability, suggesting right atrial pressure of 3 mmHg.   Comparison(s): No significant change from prior study. Prior images  reviewed side by side.  Myoview on September 24, 2020: Lexiscan stress is electrically negative for ischemia Myoview scan shows normal perfusion No ischemia or scar LVEF calculated at 68% This is a low risk study. Recent Labs: 11/29/2021: ALT 18 03/17/2022: B Natriuretic Peptide 60.0 03/18/2022: BUN 12; Creatinine, Ser 0.56; Potassium 3.5; Sodium 139 03/20/2022: Hemoglobin 11.3; Platelets 353  Recent Lipid Panel No results found for: "CHOL", "TRIG", "HDL", "CHOLHDL", "VLDL", "LDLCALC", "LDLDIRECT"   Physical Exam:    VS:  BP 130/68   Pulse 68   Ht '5\' 5"'$  (1.651 m)   Wt 157 lb 12.8 oz (71.6 kg)   SpO2 97%   BMI 26.26 kg/m     Wt Readings from Last 3 Encounters:  04/11/22 157 lb 12.8 oz (71.6 kg)  03/20/22 158 lb 8.2 oz (71.9 kg)  03/10/22 160 lb  (72.6 kg)     GEN: Well nourished, well developed in no acute distress HEENT: Normal NECK: No JVD; No carotid bruits CARDIAC: S1/S2, RRR, no murmurs, rubs, gallops; 2+ pulses RESPIRATORY:  Clear to auscultation without rales, wheezing or rhonchi; increased work of breathing while talking to me during interview today.  MUSCULOSKELETAL:  No edema; No deformity  SKIN: Warm and dry NEUROLOGIC:  Alert and oriented x 3 PSYCHIATRIC:  Normal affect  ASSESSMENT:    1. Coronary artery calcification seen on CT scan   2. SOB (shortness of breath)   3. IPF (idiopathic pulmonary fibrosis) ([place])   4. Acute pulmonary embolism, unspecified pulmonary embolism type, unspecified whether acute cor pulmonale present ([place])   5. Acute deep vein thrombosis (DVT) of lower extremity, unspecified laterality, unspecified vein (HCC)   6. Mixed hyperlipidemia   7. Essential hypertension   8. Medication management    PLAN:    In order of problems listed above:  Multivessel coronary artery calcification by chest CT Denies any chest pain, hx of chronic DOE in setting of idiopathic pulmonary fibrosis. Limited echo in December 2023 showed normal EF.  Mild left ventricular hypertrophy was noted.  Continue fenofibrate, losartan, Toprol-XL, and Repatha.  She has a history of statin intolerance.  Not on aspirin because she is on Eliquis.  Heart healthy diet encouraged. ED precautions discussed.   Shortness of breath, idiopathic pulmonary fibrosis, hx of acute PE/DVT History of chronic dyspnea on exertion in setting of idiopathic pulmonary fibrosis.  She follows pulmonary.  Was admitted for acute PE in December 2023.  CTA chest consistent with acute PE with moderate embolic burden, with CT evidence of right heart strain, consistent with at least submassive PE.  Lower extremity venous Dopplers with right leg thrombus noted.  Echocardiogram revealed normal EF, no RWMA, unable to assess PA pressure, no aortic stenosis.   Treated with IV heparin and transition to p.o. Eliquis.  Tolerating Eliquis well, denies any bleeding issues.  Continue Eliquis 5 mg twice daily. She is short of breath while speaking to me, and I discussed and recommended inpatient evaluation, however she declined.  I discussed risk regarding delayed inpatient valuation, she verbalized understanding.  Will obtain 2D echocardiogram for shortness of breath as well as obtain CBC, pro BNP, and BMET. Plan to start diuretic if BNP is elevated.  Continue to follow-up with pulmonology and hematology. ED precautions discussed.   Mixed HLD History of statin intolerance.  She has been previously referred to lipid clinic and has been started on [place].  Continue to follow-up with the lipid clinic.  HTN Blood pressure stable today.  BP well-controlled at home.  Continue current medication regimen. Heart healthy diet and regular cardiovascular exercise as tolerated encouraged.   5. Disposition: Follow-up with me in 1 week or sooner if anything changes.  Medication Adjustments/Labs and Tests Ordered: Current medicines are reviewed at length with the patient today.  Concerns regarding medicines are outlined above.  Orders Placed This Encounter  Procedures   CBC   Basic metabolic panel   Brain natriuretic peptide   ECHOCARDIOGRAM COMPLETE   No orders of the defined types were placed in this encounter.   Patient Instructions  Medication Instructions:  Continue all current medications.   Labwork: CBC, BMET, BNP - orders given today   Testing/Procedures: Your physician has requested that you have an echocardiogram. Echocardiography is a painless test that uses sound waves to create images of your heart. It provides your doctor with information about the size and shape of your heart and how well your heart's chambers and valves are working. This procedure takes approximately one hour. There are no restrictions for this procedure. Please do NOT wear  cologne, perfume, aftershave, or lotions (deodorant is allowed). Please arrive 15 minutes prior to your appointment time.   Follow-Up: Office will contact with results via phone, letter or mychart.    1 week   Any Other Special Instructions  Will Be Listed Below (If Applicable).   If you need a refill on your cardiac medications before your next appointment, please call your pharmacy.    Signed, Finis Bud, NP  04/12/2022 7:18 PM    [place]

## 2022-04-13 ENCOUNTER — Ambulatory Visit: Payer: Medicare Other | Attending: Nurse Practitioner

## 2022-04-13 DIAGNOSIS — R0602 Shortness of breath: Secondary | ICD-10-CM

## 2022-04-14 LAB — ECHOCARDIOGRAM COMPLETE
AR max vel: 1.83 cm2
AV Peak grad: 8.5 mmHg
Ao pk vel: 1.46 m/s
Area-P 1/2: 2.3 cm2
Calc EF: 69 %
MV M vel: 3.33 m/s
MV Peak grad: 44.4 mmHg
S' Lateral: 2.5 cm
Single Plane A2C EF: 69.2 %
Single Plane A4C EF: 66.8 %

## 2022-04-17 ENCOUNTER — Encounter: Payer: Self-pay | Admitting: *Deleted

## 2022-04-17 DIAGNOSIS — S49092A Other physeal fracture of upper end of humerus, left arm, initial encounter for closed fracture: Secondary | ICD-10-CM | POA: Diagnosis not present

## 2022-04-18 ENCOUNTER — Encounter: Payer: Self-pay | Admitting: Nurse Practitioner

## 2022-04-18 ENCOUNTER — Ambulatory Visit: Payer: Medicare Other | Attending: Nurse Practitioner | Admitting: Nurse Practitioner

## 2022-04-18 VITALS — BP 134/80 | HR 74 | Ht 65.0 in | Wt 158.4 lb

## 2022-04-18 DIAGNOSIS — R079 Chest pain, unspecified: Secondary | ICD-10-CM

## 2022-04-18 DIAGNOSIS — R0789 Other chest pain: Secondary | ICD-10-CM

## 2022-04-18 DIAGNOSIS — I10 Essential (primary) hypertension: Secondary | ICD-10-CM

## 2022-04-18 DIAGNOSIS — R0602 Shortness of breath: Secondary | ICD-10-CM

## 2022-04-18 DIAGNOSIS — I82409 Acute embolism and thrombosis of unspecified deep veins of unspecified lower extremity: Secondary | ICD-10-CM | POA: Diagnosis not present

## 2022-04-18 DIAGNOSIS — E782 Mixed hyperlipidemia: Secondary | ICD-10-CM | POA: Diagnosis not present

## 2022-04-18 DIAGNOSIS — J84112 Idiopathic pulmonary fibrosis: Secondary | ICD-10-CM | POA: Diagnosis not present

## 2022-04-18 DIAGNOSIS — I2699 Other pulmonary embolism without acute cor pulmonale: Secondary | ICD-10-CM | POA: Diagnosis not present

## 2022-04-18 DIAGNOSIS — I2510 Atherosclerotic heart disease of native coronary artery without angina pectoris: Secondary | ICD-10-CM

## 2022-04-18 MED ORDER — SPIRONOLACTONE 25 MG PO TABS: 12.5000 mg | ORAL_TABLET | Freq: Every day | ORAL | 1 refills | Status: DC

## 2022-04-18 NOTE — Progress Notes (Unsigned)
Cardiology Office Note:    Date:  04/18/2022  ID:  Nichole Reyes, DOB [birth year], MRN [id number]  PCP:  Lanelle Bal, [place] [facility] Cardiologist:  Rozann Lesches, MD     Referring MD: Lanelle Bal, PA-C   CC: Here for follow-up  History of Present Illness:    Nichole Reyes is a 72 y.o. female with a hx of the following:  Coronary artery calcification noted on CT scan History of shortness of breath Mixed hyperlipidemia, statin intolerance Hypertension Idiopathic pulmonary fibrosis (follows pulmonary) Type 2 diabetes Hx of DVT/PE  Patient is a 72 year old female with past medical history as mentioned above.  NST at [facility] in 2020 was negative for ischemia.  Last seen by Dr. Domenic Polite on September 14, 2020.  Denied any chest pain or palpitations.  Echocardiogram in April 2022 was reviewed and showed normal EF, mild diastolic dysfunction, mildly sclerotic aortic valve without stenosis.  EKG and office showed sinus rhythm, LVH, decreased R wave progression.  Myoview was ordered and arranged to evaluate ischemic burden, this was normal, low risk and negative for ischemia.  Dr. Domenic Polite discussed referral to lipid clinic potentially for evaluation of PCSK9 inhibitor and other treatment options.  No medication changes were made.  Was told to follow-up based on test results.  Had a fall and was seen at [facility] on March 09, 2022. This was a mechanical fall from her dog causing her to fall.  Was not on blood thinners at the time.  CT imaging showed oblique fracture of the proximal humeral neck with anterior displacement.  Was told to follow-up with Dr. Aline Brochure, orthopedic surgery, outpatient.  Was discharged on Percocet.  Was diagnosed with COVID around Thanksgiving 2023.after recovering from [place], she was found to be short of breath and found to have acute PE.  In the interim, she was admitted at [facility] for treatment of acute PE.  CTA chest  consistent with acute PE with moderate embolic burden, positive for acute PE with CT evidence of right heart strain, consistent with at least submassive PE.  Lower extremity venous Dopplers with right leg thrombus noted.  Echocardiogram revealed normal EF, no RWMA, unable to assess PA pressure, no aortic stenosis.  Treated with IV heparin and transition to p.o. Eliquis.  She was successfully weaned off oxygen.  She is here for follow-up. Says she is doing well, breathing is better but still notices dyspnea with exertion and also at rest while talking to me during interview. Compliant with her medications and tolerating them well. Denies any chest pain, palpitations, syncope, presyncope, dizziness, orthopnea, PND, swelling or significant weight changes, acute bleeding, or claudication. Has upcoming appts with hematology and lipid clinic. Denies any other questions or concerns today.   Past Medical History:  Diagnosis Date   Coronary artery calcification seen on CT scan    Essential hypertension    Fibromyalgia    Idiopathic pulmonary fibrosis ([place])    Mixed hyperlipidemia    Osteoporosis    Type 2 diabetes mellitus ([place])     Past Surgical History:  Procedure Laterality Date   ABDOMINAL HYSTERECTOMY  2007   CATARACT EXTRACTION Bilateral     Current Medications: No outpatient medications have been marked as taking for the 04/18/22 encounter (Appointment) with Finis Bud, NP.     Allergies:   Latex, Pseudoephedrine, Sulfa antibiotics, and Sulfonamide derivatives   Social History   Socioeconomic History   Marital status: Married  Spouse name: Not on file   Number of children: Not on file   Years of education: Not on file   Highest education level: Not on file  Occupational History   Not on file  Tobacco Use   Smoking status: Never    Passive exposure: Past   Smokeless tobacco: Never  Vaping Use   Vaping Use: Never used  Substance and Sexual Activity   Alcohol use: Not  Currently    Comment: ocassional   Drug use: No   Sexual activity: Not on file  Other Topics Concern   Not on file  Social History Narrative   Not on file   Social Determinants of Health   Financial Resource Strain: Not on file  Food Insecurity: No Food Insecurity (03/18/2022)   Hunger Vital Sign    Worried About Running Out of Food in the Last Year: Never true    Ran Out of Food in the Last Year: Never true  Transportation Needs: No Transportation Needs (03/18/2022)   PRAPARE - Hydrologist (Medical): No    Lack of Transportation (Non-Medical): No  Physical Activity: Not on file  Stress: Not on file  Social Connections: Not on file     Family History: The patient's family history includes COPD in her father; Cancer in her brother; Colon cancer in her sister; Diabetes in her mother; Emphysema in her paternal grandfather; Heart disease in her brother, brother, and father; Hypertension in her father and mother; Osteoporosis in her mother.  ROS:   Review of Systems  Constitutional: Negative.   HENT: Negative.    Eyes: Negative.   Respiratory:  Positive for shortness of breath. Negative for cough, hemoptysis, sputum production and wheezing.   Cardiovascular: Negative.   Gastrointestinal: Negative.   Genitourinary: Negative.   Musculoskeletal: Negative.   Skin: Negative.   Neurological: Negative.   Endo/Heme/Allergies: Negative.   Psychiatric/Behavioral: Negative.      Please see the history of present illness.    All other systems reviewed and are negative.  EKGs/Labs/Other Studies Reviewed:    The following studies were reviewed today:   EKG:  EKG is ordered today and demonstrates NSR, 70 bpm, LVH, with nonspecific ST abnormality, otherwise nothing acute.   Limited Echo on 03/19/2022: 1. Left ventricular ejection fraction, by estimation, is 60 to 65%. The  left ventricle has normal function. There is mild left ventricular  hypertrophy.    2. Right ventricular systolic function is normal. The right ventricular  size is normal. Tricuspid regurgitation signal is inadequate for assessing  PA pressure.   3. No evidence of mitral valve regurgitation.   4. Aortic valve sclerosis/calcification is present, without any evidence  of aortic stenosis.   5. The inferior vena cava is normal in size with greater than 50%  respiratory variability, suggesting right atrial pressure of 3 mmHg.   Comparison(s): No significant change from prior study.  Venous ultrasound of bilateral lower extremities on March 19, 2022: IMPRESSION: 1. Acute deep vein thrombosis limited to the right gastrocnemius calf vein. No evidence of iliofemoral thrombus. 2. No evidence of left lower extremity deep vein thrombosis.  Complete echocardiogram on October 31, 2021: 1. Left ventricular ejection fraction, by estimation, is 60 to 65%. The  left ventricle has normal function. The left ventricle has no regional  wall motion abnormalities. There is mild left ventricular hypertrophy.  Left ventricular diastolic parameters  are consistent with Grade I diastolic dysfunction (impaired relaxation).   2.  Right ventricular systolic function is normal. The right ventricular  size is normal. Tricuspid regurgitation signal is inadequate for assessing  PA pressure.   3. Left atrial size was mildly dilated.   4. The mitral valve is grossly normal. Trivial mitral valve  regurgitation.   5. The aortic valve is tricuspid. Aortic valve regurgitation is not  visualized. Aortic valve sclerosis is present, with no evidence of aortic  valve stenosis.   6. The inferior vena cava is normal in size with greater than 50%  respiratory variability, suggesting right atrial pressure of 3 mmHg.   Comparison(s): No significant change from prior study. Prior images  reviewed side by side.  Myoview on September 24, 2020: Lexiscan stress is electrically negative for ischemia Myoview scan  shows normal perfusion No ischemia or scar LVEF calculated at 68% This is a low risk study. Recent Labs: 11/29/2021: ALT 18 03/17/2022: B Natriuretic Peptide 60.0 03/18/2022: BUN 12; Creatinine, Ser 0.56; Potassium 3.5; Sodium 139 03/20/2022: Hemoglobin 11.3; Platelets 353  Recent Lipid Panel No results found for: "CHOL", "TRIG", "HDL", "CHOLHDL", "VLDL", "LDLCALC", "LDLDIRECT"   Physical Exam:    VS:  There were no vitals taken for this visit.    Wt Readings from Last 3 Encounters:  04/11/22 157 lb 12.8 oz (71.6 kg)  03/20/22 158 lb 8.2 oz (71.9 kg)  03/10/22 160 lb (72.6 kg)     GEN: Well nourished, well developed in no acute distress HEENT: Normal NECK: No JVD; No carotid bruits CARDIAC: S1/S2, RRR, no murmurs, rubs, gallops; 2+ pulses RESPIRATORY:  Clear to auscultation without rales, wheezing or rhonchi; increased work of breathing while talking to me during interview today.  MUSCULOSKELETAL:  No edema; No deformity  SKIN: Warm and dry NEUROLOGIC:  Alert and oriented x 3 PSYCHIATRIC:  Normal affect   ASSESSMENT:    No diagnosis found.  PLAN:    In order of problems listed above:  Multivessel coronary artery calcification by chest CT Denies any chest pain, hx of chronic DOE in setting of idiopathic pulmonary fibrosis. Limited echo in December 2023 showed normal EF.  Mild left ventricular hypertrophy was noted.  Continue fenofibrate, losartan, Toprol-XL, and Repatha.  She has a history of statin intolerance.  Not on aspirin because she is on Eliquis.  Heart healthy diet encouraged. ED precautions discussed.   Shortness of breath, idiopathic pulmonary fibrosis, hx of acute PE/DVT History of chronic dyspnea on exertion in setting of idiopathic pulmonary fibrosis.  She follows pulmonary.  Was admitted for acute PE in December 2023.  CTA chest consistent with acute PE with moderate embolic burden, with CT evidence of right heart strain, consistent with at least submassive PE.   Lower extremity venous Dopplers with right leg thrombus noted.  Echocardiogram revealed normal EF, no RWMA, unable to assess PA pressure, no aortic stenosis.  Treated with IV heparin and transition to p.o. Eliquis.  Tolerating Eliquis well, denies any bleeding issues.  Continue Eliquis 5 mg twice daily. She is short of breath while speaking to me, and I discussed and recommended inpatient evaluation, however she declined.  I discussed risk regarding delayed inpatient valuation, she verbalized understanding.  Will obtain 2D echocardiogram for shortness of breath as well as obtain CBC, pro BNP, and BMET. Plan to start diuretic if BNP is elevated.  Continue to follow-up with pulmonology and hematology. ED precautions discussed.   Mixed HLD History of statin intolerance.  She has been previously referred to lipid clinic and has been started on  Repatha.  Continue to follow-up with the lipid clinic.  HTN Blood pressure stable today.  BP well-controlled at home.  Continue current medication regimen. Heart healthy diet and regular cardiovascular exercise as tolerated encouraged.   5. Disposition: Follow-up with me in 1 week or sooner if anything changes.  Medication Adjustments/Labs and Tests Ordered: Current medicines are reviewed at length with the patient today.  Concerns regarding medicines are outlined above.  No orders of the defined types were placed in this encounter.  No orders of the defined types were placed in this encounter.   There are no Patient Instructions on file for this visit.    Signed, Finis Bud, NP  04/18/2022 9:14 AM    [place]

## 2022-04-18 NOTE — Patient Instructions (Addendum)
Medication Instructions:  Your physician has recommended you make the following change in your medication:  Start spironolactone 12.5 mg daily Continue other medications the same  Labwork: none  Testing/Procedures: none  Follow-Up: Your physician recommends that you schedule a follow-up appointment in: 04/28/2022 with Finis Bud  Any Other Special Instructions Will Be Listed Below (If Applicable).  If you need a refill on your cardiac medications before your next appointment, please call your pharmacy.

## 2022-04-20 DIAGNOSIS — S49092A Other physeal fracture of upper end of humerus, left arm, initial encounter for closed fracture: Secondary | ICD-10-CM | POA: Diagnosis not present

## 2022-04-21 ENCOUNTER — Ambulatory Visit: Payer: Medicare Other | Admitting: Nurse Practitioner

## 2022-04-21 ENCOUNTER — Encounter: Payer: Self-pay | Admitting: Nurse Practitioner

## 2022-04-21 VITALS — BP 116/78 | HR 78 | Temp 98.1°F | Ht 65.0 in | Wt 159.6 lb

## 2022-04-21 DIAGNOSIS — J84112 Idiopathic pulmonary fibrosis: Secondary | ICD-10-CM | POA: Diagnosis not present

## 2022-04-21 DIAGNOSIS — I2699 Other pulmonary embolism without acute cor pulmonale: Secondary | ICD-10-CM | POA: Diagnosis not present

## 2022-04-21 DIAGNOSIS — I82409 Acute embolism and thrombosis of unspecified deep veins of unspecified lower extremity: Secondary | ICD-10-CM | POA: Insufficient documentation

## 2022-04-21 NOTE — Addendum Note (Signed)
Addended by: Clayton Bibles on: 04/21/2022 03:33 PM   Modules accepted: Orders

## 2022-04-21 NOTE — Patient Instructions (Addendum)
Continue flovent 2 puffs Twice daily. Brush tongue and rinse mouth afterwards  Continue Albuterol inhaler 2 puffs every 6 hours as needed for shortness of breath or wheezing. Notify if symptoms persist despite rescue inhaler/neb use.  Continue Esbriet 801 mg Three times a day    -Wear sunscreen while outside   -Take with food  Continue omeprazole 1 capsule daily  Continue Eliquis 5 mg Twice daily. Monitor for any bleeding or excessive bruising. If you have an injury where you hit your head, you develop severe sudden headache or you have sudden vision changes, go to the emergency department   High resolution CT chest for March 2024   Follow up end of March with Dr. Chase Caller (1st) or Katie Jacquese Cassarino,NP  and spiro/DLCO beforehand. If symptoms do not improve or worsen, please contact office for sooner follow up or seek emergency care.

## 2022-04-21 NOTE — Assessment & Plan Note (Signed)
IPF with stability on previous PFTs. Increased DOE correlates to clot burden from acute PE given timeframe. We will plan for repeat HRCT and PFTs in March 2024, as previously ordered. She will continue on antifibrotic therapy with Esbriet.

## 2022-04-21 NOTE — Progress Notes (Signed)
$'@Patient'j$  ID: Steva Colder, female    DOB: 22-Feb-1951, 72 y.o.   MRN: [id number]  Chief Complaint  Patient presents with   Hospitalization Follow-up    Pt states that she has had some SOB with exertion and talking.     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 She is a patient of Dr. Golden Pop and last seen in office 02/28/2022. Past medical history significant for migraine, HTN, allergic rhinitis, DDD, OA, HLD, depression.  TEST/EVENTS:  01/24/2021 PFT: FVC 66, FEV1 77, ratio 89, DLCOcor 72 05/31/2020 HRCT chest: widespread ares of peripheral predominant ground-glass attenuation, septal thickening, mild cylindrical btx and peripheral bronchiolectasis. Mild craniocaudal gradient. UIP pattern. There is a 7x5 mm supleural nodule in the RML, stable. RLL 2.3x0.8cm elongated nodular area, stable.  08/18/2021 PFT: FVC 66, FEV1 78, ratio 90, DLCOcor 78 11/29/2021 PFT: FVC 65, FEV1 78, ratio 91, DLCOcor 72 03/17/2022 CTA chest: acute PE with RV strain, consistent with at least a submassive PE. Stable subpleural pulmonary fibrosis demonstrating a basilar gradient. Acute fx of the left humeral head. Multiple pulmonary nodules; felt to be stable but there was motion artifact so not fully evaluated.   08/18/2021: OV with Dr. Chase Caller.  Clinical IPF diagnosis given December 2020 and reiterated mid February 2021.  She has been on Esbriet since 05/22/2019.  Tolerating well for the most part.  She did have some drug-induced weight loss which had stabilized.  PFTs show overall slow decline over a long period of time but most recently the 2 studies are stable.  She does get dyspneic while walking the dog.  Discussed that this was likely related to some deconditioning.  She had previously been referred to pulmonary rehab but due to her schedule this did not allow her to attend.  Plan for 6-minute walk test to look for exertional desaturation and see  if she would qualify for oxygen.  She did report having significant nausea at this OV, which was new.  She developed it off suddenly over the last few months.  She started taking over-the-counter Emetrol few days ago and this seems to help.  She was only spacing as.  Out every 4 hours and was recommended to push this to 5 to 6 hours to see if this helps with nausea.  She did recently have a change from Zetia back to fenofibrate and timing of the change does correlate with nausea onset.  Labs were stable.  Referred to clinical trials.  Repeat spirometry and DLCO in 3 to 4 months.  Echocardiogram to assess for who 3 PAH.  09/29/2021: OV with Yoshiaki Kreuser NP for follow-up.  She was previously having trouble with nausea, thought to be related to [place].  She was advised to space out dosing and continue with over-the-counter Emetrol.  Today, she reports feeling better.  Nausea has subsided and she has not been having to take Emetrol as often.  She feels pretty much back to her baseline compared to the last few months.  As far as her breathing goes, she gets short winded with walking her dog and also when she is outside working in the yard.  She will use albuterol which does seem to help.  She is also on Flovent twice a day.  She has an occasional dry cough which is unchanged from her baseline.  She also has some allergy type symptoms with nasal congestion and clear drainage that improved with over-the-counter Xyzal  and Flonase.  She has not had any further weight loss and appetite is stable.  11/29/2021: Ov with Lynix Bonine NP for follow up after PFTs. Her pulmonary function testing was relatively stable. DLCO reduced by 6% from June but the same as 10 months ago. She reports that her breathing is relatively similar when compared to last time I saw her; maybe slightly worse. She does She has been trying to work on exercising more often. She was previously referred to pulmonary rehab but can't make it work with their current  schedule. She denies any increased cough or chest congestion. No lower extremity swelling or orthopnea. She does feel like she gets benefit from flovent. We previously tried to step her up to ICS/LABA combo but they were all more expensive and she has struggled with affording her current medications as is. She rarely uses her albuterol. She is tolerating Esbriet well since she starting spacing out her dosing. No more issues with nausea, for the most part.   SYMPTOM SCALE - ILD 05/06/2019 179# 12/16/2019 172# - esbriet + 05/06/2020 169# - esbriet 07/13/2020 167# - on esbreiet 01/24/2021 164# 08/18/2021 166# - esbriet 11/29/2021  O2 use ra           ra  Shortness of Breath 0 -> 5 scale with 5 being worst (score 6 If unable to do)             At rest 4 1 0 '2 3 1 '$ 3-4  Simple tasks - showers, clothes change, eating, shaving '4 1 2 2 4 3 4  '$ Household (dishes, doing bed, laundry) '5 5 4 4 5 5 4  '$ Shopping '5 3 4 3 4 5 5  '$ Walking level at own pace '5 3 4 3 4 5 5  '$ Walking up Stairs '5 5 5 4 5 5 5  '$ Total (30-36) Dyspnea Score '28 15 19 18 25 24   '$ How bad is your cough? 2.5 1 0 0 0 2 3  How bad is your fatigue  yes '4 3 4 3 4 3  '$ How bad is nausea x 0 2 0 '2 4 2  '$ How bad is vomiting?   x 0 0 0 0 0 0  How bad is diarrhea? x 0 0 0 0 0 0  How bad is anxiety? x 00 '1 2 3 2 4  '$ How bad is depression x 0 '3 2 2 3 3          '$ Simple office walk 185 feet x  3 laps goal with forehead probe 05/06/2019   12/16/2019   05/06/2020   07/13/2020   01/24/2021   08/18/2021   11/29/2021  O2 used ra ra ra ra ra ra ra  Number laps completed '3 3 3 '$ goal - stopped at 2 due to dyspnea 3 and did all '3 3 3 3  '$ Comments about pace avg mod Slow pace avg     avg  Resting Pulse Ox/HR 98% and 90/min 97% and 77/min 98% and 90/min 96% and 94 98% and 90 100% and 84   Final Pulse Ox/HR 96% and 100/min 97% and 91/mn 94% and 92 at 2nd lap 93% and 103/min 96% and 99 95% and 96   Desaturated </= 88% no no no no no     Desaturated <= 3% points no no Yes,,  4 pint Yes 3 points no     Got Tachycardic >/= 90/min yes yes yes yes       Symptoms at  end of test Mod dyspnea, knee pain Dyspnea and fatigue strting at 1st lap and increased by 3rd stoppe early and did drop 3 points Dyspnea at end   Dyspnea mild to mod, light headed   Miscellaneous comments x              02/28/2022: OV with Dr. Chase Caller. History of COVID Thanksgiving 2023; treated with Paxlovid outpatient. Fatigue related syncope along with polypharmacy. Since then, feeling well. From a respiratory standpoint, doing well. Tolerating Esbriet well. Normal echo summer 2023. Stable PFTs. HRCT March 2024. Repeat spiro/DLCO in 3-4 months.   04/21/2022: Today - follow up Patient presents today for hospital follow-up.  She had gone to the ED on 12/21 after a fall.  She had left shoulder pain.  She was found to have left humerus fracture. She was discharged home with plans to follow up with orthopedics. She then developed sudden SOB morning of 12/29. She presented to the ED and was found to have acute PE with concern for right heart strain on CTA chest. She was admitted for further management and found to have RLE thrombus as well. She was treated with IV heparin and transitioned to PO Eliquis. Her hypoxia resolved and she was discharged home on 03/20/2022.  Today, she tells me that she is feeling better since discharge but still remains more short of breath than her baseline. She feels like it waxes and wanes from day to day. Notices it mostly with exertion. Her cough is unchanged from baseline. Denies any fevers, chills, leg swelling, hemoptysis, orthopnea. Doing well on her Eliquis; admits to adherence. She has no issues with her Esbriet. Weight is within 5 lb of her visit in December 2023. She is working with physical therapy for her humerus fracture, which is non-operative.   Allergies  Allergen Reactions   Latex    Pseudoephedrine Other (See Comments)    Rash and hallucinations    Sulfa Antibiotics     Sulfonamide Derivatives Rash    Immunization History  Administered Date(s) Administered   Fluad Quad(high Dose 65+) 01/29/2019, 12/16/2019, 01/24/2021   Influenza Split 12/18/2017   Influenza-Unspecified 11/18/2013   PFIZER(Purple Top)SARS-COV-2 Vaccination 05/15/2019, 06/13/2019   PNEUMOCOCCAL CONJUGATE-20 03/20/2022   Tdap 05/04/1999    Past Medical History:  Diagnosis Date   Coronary artery calcification seen on CT scan    Essential hypertension    Fibromyalgia    Idiopathic pulmonary fibrosis (HCC)    Mixed hyperlipidemia    Osteoporosis    Type 2 diabetes mellitus (HCC)     Tobacco History: Social History   Tobacco Use  Smoking Status Never   Passive exposure: Past  Smokeless Tobacco Never   Counseling given: Not Answered   Outpatient Medications Prior to Visit  Medication Sig Dispense Refill   ACCU-CHEK AVIVA PLUS test strip      Accu-Chek Softclix Lancets lancets      albuterol (VENTOLIN HFA) 108 (90 Base) MCG/ACT inhaler Inhale 2 puffs into the lungs every 4 (four) hours as needed for wheezing or shortness of breath.     apixaban (ELIQUIS) 5 MG TABS tablet Take 1 tablet (5 mg total) by mouth 2 (two) times daily. Start in 1 month after completing initial Starter pack 60 tablet 4   Calcium Carbonate (CALTRATE 600 PO) Take 1 tablet by mouth 2 (two) times a day.     clonazePAM (KLONOPIN) 0.5 MG tablet Take 0.5 mg by mouth at bedtime.     cyanocobalamin (VITAMIN B12) 1000  MCG tablet Take 1,000 mcg by mouth daily.     ESBRIET 801 MG TABS Take 801 mg by mouth 3 (three) times daily with meals. 270 tablet 1   fenofibrate (TRICOR) 48 MG tablet Take 48 mg by mouth at bedtime.     FLOVENT HFA 110 MCG/ACT inhaler INHALE 1 PUFF INTO THE LUNGS 2 TIMES A DAY. 12 g 0   fluticasone (FLONASE) 50 MCG/ACT nasal spray Place 2 sprays into both nostrils daily.     levocetirizine (XYZAL) 5 MG tablet Take 5 mg by mouth daily.     losartan (COZAAR) 50 MG tablet Take 50 mg by mouth  daily.     metFORMIN (GLUCOPHAGE-XR) 500 MG 24 hr tablet Take 500 mg by mouth daily with breakfast.      metoprolol succinate (TOPROL-XL) 100 MG 24 hr tablet Take 100 mg by mouth daily. Take with or immediately following a meal.     omeprazole (PRILOSEC) 20 MG capsule TAKE 1 CAPSULE BY MOUTH ONCE DAILY. (Patient taking differently: Take 20 mg by mouth daily.) 30 capsule 5   Oxycodone HCl 10 MG TABS Take 10 mg by mouth daily as needed (severe pain).     REPATHA SURECLICK 122 MG/ML SOAJ Inject 1 mL into the skin every 14 (fourteen) days.     SAVELLA 50 MG TABS tablet Take 50 mg by mouth 2 (two) times daily.      spironolactone (ALDACTONE) 25 MG tablet Take 0.5 tablets (12.5 mg total) by mouth daily. 45 tablet 1   tiZANidine (ZANAFLEX) 4 MG tablet TAKE 1 TABLET BY MOUTH AT BEDTIME. 30 tablet 0   No facility-administered medications prior to visit.     Review of Systems:   Constitutional: No weight loss or gain, night sweats, fevers, chills +fatigue (chronic, unchanged). HEENT: No headaches, difficulty swallowing, tooth/dental problems, or sore throat. No sneezing, itching, ear ache +nasal congestion/drainage (chronic; improved with antihistamine and intranasal steroid) CV:  No chest pain, orthopnea, PND, swelling in lower extremities, anasarca, dizziness, palpitations, syncope Resp: +shortness of breath with exertion (increase from baseline but stable from discharge); cough (baseline). No excess mucus or change in color of mucus. No hemoptysis. No wheezing.  No chest wall deformity GI:  No heartburn, indigestion, abdominal pain, nausea, vomiting, diarrhea, change in bowel habits, loss of appetite, bloody stools.  GU: No dysuria, change in color of urine, urgency or frequency.  Skin: No rash, lesions, ulcerations MSK:  No joint pain or swelling.   Neuro: No dizziness or lightheadedness.  Psych: +anxiety (stable). No SI/HI. Mood stable.     Physical Exam:  BP 116/78 (BP Location: Right Arm,  Patient Position: Sitting, Cuff Size: Normal)   Pulse 78   Temp 98.1 F (36.7 C) (Oral)   Ht '5\' 5"'$  (1.651 m)   Wt 159 lb 9.6 oz (72.4 kg)   SpO2 98%   BMI 26.56 kg/m   GEN: Pleasant, interactive, well-appearing; in no acute distress. HEENT:  Normocephalic and atraumatic. PERRLA. Sclera white. Nasal turbinates erythematous, moist and patent bilaterally. No rhinorrhea present. Oropharynx pink and moist, without exudate or edema. No lesions, ulcerations, or postnasal drip.  NECK:  Supple w/ fair ROM. No JVD present. Normal carotid impulses w/o bruits. Thyroid symmetrical with no goiter or nodules palpated. No lymphadenopathy.   CV: RRR, no m/r/g, no peripheral edema. Pulses intact, +2 bilaterally. No cyanosis, pallor or clubbing. PULMONARY:  Unlabored, regular breathing. Bibasilar crackles otherwise clear bilaterally A&P w/o wheezes/rales/rhonchi. No accessory muscle use. No dullness  to percussion. GI: BS present and normoactive. Soft, non-tender to palpation. No organomegaly or masses detected.  MSK: No erythema, warmth or tenderness. Cap refil <2 sec all extrem. No deformities or joint swelling noted.  Neuro: A/Ox3. No focal deficits noted.   Skin: Warm, no lesions or rashe Psych: Normal affect and behavior. Judgement and thought content appropriate.     Lab Results:  CBC    Component Value Date/Time   WBC 9.5 03/20/2022 0434   RBC 3.53 (L) 03/20/2022 0434   HGB 11.3 (L) 03/20/2022 0434   HCT 34.9 (L) 03/20/2022 0434   PLT 353 03/20/2022 0434   MCV 98.9 03/20/2022 0434   MCH 32.0 03/20/2022 0434   MCHC 32.4 03/20/2022 0434   RDW 13.6 03/20/2022 0434   LYMPHSABS 2,678 03/15/2017 1457   MONOABS 970 (H) 06/28/2016 1514   EOSABS 200 03/15/2017 1457   BASOSABS 147 03/15/2017 1457    BMET    Component Value Date/Time   NA 139 03/18/2022 0605   K 3.5 03/18/2022 0605   CL 107 03/18/2022 0605   CO2 23 03/18/2022 0605   GLUCOSE 153 (H) 03/18/2022 0605   BUN 12 03/18/2022 0605    CREATININE 0.56 03/18/2022 0605   CREATININE 0.73 03/15/2017 1457   CALCIUM 9.5 03/18/2022 0605   GFRNONAA >60 03/18/2022 0605   GFRNONAA 87 03/15/2017 1457   GFRAA 101 03/15/2017 1457    BNP    Component Value Date/Time   BNP 60.0 03/17/2022 1637     Imaging:  ECHOCARDIOGRAM COMPLETE  Result Date: 04/14/2022    ECHOCARDIOGRAM REPORT   Patient Name:   COILA WARDELL Mikkelsen Date of Exam: 04/13/2022 Medical Rec #:  [id number]         Height:       65.0 in Accession #:    [id number]        Weight:       157.8 lb Date of Birth:  07-Mar-1951         BSA:          1.789 m Patient Age:    76 years          BP:           130/68 mmHg Patient Gender: F                 HR:           76 bpm. Exam Location:  [facility] Procedure: 2D Echo, Cardiac Doppler, Color Doppler and Strain Analysis Indications:    R06.02 SOB  History:        Patient has prior history of Echocardiogram examinations, most                 recent 03/19/2022. Abnormal ECG, Covid, Pulmonary embolism,                 Signs/Symptoms:Shortness of Breath; Risk Factors:Non-Smoker,                 Hypertension, Diabetes and Dyslipidemia.  Sonographer:    Jeneen Montgomery RDMS, RVT, RDCS Referring Phys: [id number] [facility]  Sonographer Comments: Image acquisition challenging due to patient body habitus. Patient unable to raise left arm or turn on left side due to left humural fracture. IMPRESSIONS  1. Left ventricular ejection fraction, by estimation, is 60 to 65%. The left ventricle has normal function. The left ventricle has no regional wall motion abnormalities. There is mild left ventricular hypertrophy. Left ventricular diastolic parameters are consistent with  Grade I diastolic dysfunction (impaired relaxation). The average left ventricular global longitudinal strain is -19.9 %. The global longitudinal strain is normal.  2. Right ventricular systolic function is normal. The right ventricular size is normal. Tricuspid regurgitation signal is inadequate  for assessing PA pressure.  3. The mitral valve is normal in structure. Trivial mitral valve regurgitation. No evidence of mitral stenosis.  4. The aortic valve is tricuspid. There is mild calcification of the aortic valve. Aortic valve regurgitation is not visualized. No aortic stenosis is present. Comparison(s): No significant change from prior study. FINDINGS  Left Ventricle: Left ventricular ejection fraction, by estimation, is 60 to 65%. The left ventricle has normal function. The left ventricle has no regional wall motion abnormalities. The average left ventricular global longitudinal strain is -19.9 %. The global longitudinal strain is normal. The left ventricular internal cavity size was normal in size. There is mild left ventricular hypertrophy. Left ventricular diastolic parameters are consistent with Grade I diastolic dysfunction (impaired relaxation). Right Ventricle: The right ventricular size is normal. No increase in right ventricular wall thickness. Right ventricular systolic function is normal. Tricuspid regurgitation signal is inadequate for assessing PA pressure. Left Atrium: Left atrial size was normal in size. Right Atrium: Right atrial size was normal in size. Pericardium: There is no evidence of pericardial effusion. Mitral Valve: The mitral valve is normal in structure. Trivial mitral valve regurgitation. No evidence of mitral valve stenosis. Tricuspid Valve: The tricuspid valve is normal in structure. Tricuspid valve regurgitation is not demonstrated. No evidence of tricuspid stenosis. Aortic Valve: The aortic valve is tricuspid. There is mild calcification of the aortic valve. Aortic valve regurgitation is not visualized. No aortic stenosis is present. Aortic valve peak gradient measures 8.5 mmHg. Pulmonic Valve: The pulmonic valve was normal in structure. Pulmonic valve regurgitation is not visualized. No evidence of pulmonic stenosis. Aorta: The aortic root is normal in size and  structure. Venous: The inferior vena cava was not well visualized. IAS/Shunts: No atrial level shunt detected by color flow Doppler.  LEFT VENTRICLE PLAX 2D LVIDd:         4.20 cm     Diastology LVIDs:         2.50 cm     LV e' medial:    4.13 cm/s LV PW:         1.20 cm     LV E/e' medial:  17.2 LV IVS:        1.10 cm     LV e' lateral:   5.77 cm/s LVOT diam:     1.80 cm     LV E/e' lateral: 12.3 LV SV:         62 LV SV Index:   35          2D Longitudinal Strain LVOT Area:     2.54 cm    2D Strain GLS (A2C):   -23.6 %                            2D Strain GLS (A3C):   -18.6 %                            2D Strain GLS (A4C):   -17.5 % LV Volumes (MOD)           2D Strain GLS Avg:     -19.9 % LV vol d, MOD A2C: 60.1 ml  LV vol d, MOD A4C: 45.8 ml LV vol s, MOD A2C: 18.5 ml LV vol s, MOD A4C: 15.2 ml LV SV MOD A2C:     41.6 ml LV SV MOD A4C:     45.8 ml LV SV MOD BP:      38.0 ml RIGHT VENTRICLE RV S prime:     10.70 cm/s TAPSE (M-mode): 2.0 cm LEFT ATRIUM             Index        RIGHT ATRIUM           Index LA diam:        3.40 cm 1.90 cm/m   RA Area:     10.52 cm LA Vol (A2C):   51.5 ml 28.79 ml/m  RA Volume:   22.80 ml  12.75 ml/m LA Vol (A4C):   57.4 ml 32.09 ml/m LA Biplane Vol: 56.5 ml 31.59 ml/m  AORTIC VALVE AV Area (Vmax): 1.83 cm AV Vmax:        146.00 cm/s AV Peak Grad:   8.5 mmHg LVOT Vmax:      105.00 cm/s LVOT Vmean:     75.500 cm/s LVOT VTI:       0.243 m  AORTA Ao Root diam: 2.90 cm MITRAL VALVE                TRICUSPID VALVE MV Area (PHT): 2.30 cm     TR Peak grad:   15.8 mmHg MV Decel Time: 330 msec     TR Vmax:        199.00 cm/s MR Peak grad: 44.4 mmHg MR Vmax:      333.00 cm/s   SHUNTS MV E velocity: 71.10 cm/s   Systemic VTI:  0.24 m MV A velocity: 103.00 cm/s  Systemic Diam: 1.80 cm MV E/A ratio:  0.69 Vishnu Priya Mallipeddi Electronically signed by Lorelee Cover Mallipeddi Signature Date/Time: 04/14/2022/7:24:37 AM    Final    DG Shoulder Left  Result Date: 03/24/2022 Follow-up x-rays  left shoulder Proximal humerus fracture X-rays show some displacement of the head related to the shaft approximately 40% shaft width No change in position from prior x-ray Fracture appears stable Proximal humerus left shoulder with 40% displacement head related to shaft         Latest Ref Rng & Units 11/29/2021   11:32 AM 08/18/2021    9:16 AM 01/24/2021   10:55 AM 07/13/2020    1:01 PM 08/26/2019    1:39 PM 11/19/2018    2:57 PM  PFT Results  FVC-Pre L 2.10  2.14  2.12  2.32  2.36  2.29   FVC-Predicted Pre % 65  66  66  72  72  70   FVC-Post L      2.30   FVC-Predicted Post %      70   Pre FEV1/FVC % % 91  90  89  89  88  89   Post FEV1/FCV % %      90   FEV1-Pre L 1.91  1.91  1.88  2.07  2.08  2.04   FEV1-Predicted Pre % 78  78  77  84  83  82   FEV1-Post L      2.07   DLCO uncorrected ml/min/mmHg 14.80  15.98  14.63  13.98  15.62  15.75   DLCO UNC% % 72  78  72  68  76  77   DLCO corrected ml/min/mmHg 14.80  15.98  14.63  13.98  15.62    DLCO COR %Predicted % 72  78  72  68  76    DLVA Predicted % 113  109  114  107  113  118   TLC L      3.44   TLC % Predicted %      66   RV % Predicted %      50     No results found for: "NITRICOXIDE"      Assessment & Plan:   Acute pulmonary embolism (HCC) Acute PE with BLE DVT. Possibly provoked given recent fall and injury with increased immobility? She will need a minimum of six months of therapy. No evidence of right heart strain on echo. Suspect that her residual DOE is related to clot burden vs worsening IPF given stability on imaging and timing of onset. Reviewed anticoagulation therapy and red flag symptoms. Encouraged her to work on graded exercises. Walking oximetry today without desaturations. We will obtain ONO on room air to ensure hypoxia is not a contributing factor to her DOE.   Patient Instructions  Continue flovent 2 puffs Twice daily. Brush tongue and rinse mouth afterwards  Continue Albuterol inhaler 2 puffs every 6 hours  as needed for shortness of breath or wheezing. Notify if symptoms persist despite rescue inhaler/neb use.  Continue Esbriet 801 mg Three times a day    -Wear sunscreen while outside   -Take with food  Continue omeprazole 1 capsule daily  Continue Eliquis 5 mg Twice daily. Monitor for any bleeding or excessive bruising. If you have an injury where you hit your head, you develop severe sudden headache or you have sudden vision changes, go to the emergency department   High resolution CT chest for March 2024   Follow up end of March with Dr. Chase Caller (1st) or Katie Alecsander Hattabaugh,NP  and spiro/DLCO beforehand. If symptoms do not improve or worsen, please contact office for sooner follow up or seek emergency care.    IPF (idiopathic pulmonary fibrosis) (HCC) IPF with stability on previous PFTs. Increased DOE correlates to clot burden from acute PE given timeframe. We will plan for repeat HRCT and PFTs in March 2024, as previously ordered. She will continue on antifibrotic therapy with Esbriet.     I spent 45 minutes of dedicated to the care of this patient on the date of this encounter to include pre-visit review of records, face-to-face time with the patient discussing conditions above, post visit ordering of testing, clinical documentation with the electronic health record, making appropriate referrals as documented, and communicating necessary findings to members of the patients care team.  Clayton Bibles, NP 04/21/2022  Pt aware and understands NP's role.

## 2022-04-21 NOTE — Assessment & Plan Note (Addendum)
Acute PE with BLE DVT. Possibly provoked given recent fall and injury with increased immobility? She will need a minimum of six months of therapy. No evidence of right heart strain on echo. Suspect that her residual DOE is related to clot burden vs worsening IPF given stability on imaging and timing of onset. Reviewed anticoagulation therapy and red flag symptoms. Encouraged her to work on graded exercises. Walking oximetry today without desaturations. We will obtain ONO on room air to ensure hypoxia is not a contributing factor to her DOE.   Patient Instructions  Continue flovent 2 puffs Twice daily. Brush tongue and rinse mouth afterwards  Continue Albuterol inhaler 2 puffs every 6 hours as needed for shortness of breath or wheezing. Notify if symptoms persist despite rescue inhaler/neb use.  Continue Esbriet 801 mg Three times a day    -Wear sunscreen while outside   -Take with food  Continue omeprazole 1 capsule daily  Continue Eliquis 5 mg Twice daily. Monitor for any bleeding or excessive bruising. If you have an injury where you hit your head, you develop severe sudden headache or you have sudden vision changes, go to the emergency department   High resolution CT chest for March 2024   Follow up end of March with Dr. Chase Caller (1st) or Katie Leira Regino,NP  and spiro/DLCO beforehand. If symptoms do not improve or worsen, please contact office for sooner follow up or seek emergency care.

## 2022-04-24 DIAGNOSIS — S49092A Other physeal fracture of upper end of humerus, left arm, initial encounter for closed fracture: Secondary | ICD-10-CM | POA: Diagnosis not present

## 2022-04-25 ENCOUNTER — Ambulatory Visit: Payer: Medicare Other | Admitting: Internal Medicine

## 2022-04-27 DIAGNOSIS — S49092A Other physeal fracture of upper end of humerus, left arm, initial encounter for closed fracture: Secondary | ICD-10-CM | POA: Diagnosis not present

## 2022-04-28 ENCOUNTER — Encounter: Payer: Self-pay | Admitting: Nurse Practitioner

## 2022-04-28 ENCOUNTER — Ambulatory Visit: Payer: Medicare Other | Attending: Nurse Practitioner | Admitting: Nurse Practitioner

## 2022-04-28 VITALS — BP 128/70 | HR 117 | Ht 65.0 in | Wt 158.4 lb

## 2022-04-28 DIAGNOSIS — I10 Essential (primary) hypertension: Secondary | ICD-10-CM | POA: Diagnosis not present

## 2022-04-28 DIAGNOSIS — E782 Mixed hyperlipidemia: Secondary | ICD-10-CM | POA: Diagnosis not present

## 2022-04-28 DIAGNOSIS — I2699 Other pulmonary embolism without acute cor pulmonale: Secondary | ICD-10-CM

## 2022-04-28 DIAGNOSIS — R0602 Shortness of breath: Secondary | ICD-10-CM | POA: Diagnosis not present

## 2022-04-28 DIAGNOSIS — J84112 Idiopathic pulmonary fibrosis: Secondary | ICD-10-CM

## 2022-04-28 DIAGNOSIS — I824Z1 Acute embolism and thrombosis of unspecified deep veins of right distal lower extremity: Secondary | ICD-10-CM | POA: Diagnosis not present

## 2022-04-28 DIAGNOSIS — I2510 Atherosclerotic heart disease of native coronary artery without angina pectoris: Secondary | ICD-10-CM

## 2022-04-28 DIAGNOSIS — R000 Tachycardia, unspecified: Secondary | ICD-10-CM

## 2022-04-28 NOTE — Patient Instructions (Signed)
Medication Instructions:  Continue all current medications.   Labwork: none  Testing/Procedures: none  Follow-Up: 6 months   Any Other Special Instructions Will Be Listed Below (If Applicable).   If you need a refill on your cardiac medications before your next appointment, please call your pharmacy.  

## 2022-04-28 NOTE — Progress Notes (Unsigned)
Cardiology Office Note:    Date:  04/28/2022  ID:  Nichole Reyes, DOB 01-15-1951, MRN [id number]  PCP:  Lanelle Bal, [facility] Cardiologist:  Rozann Lesches, MD     Referring MD: Lanelle Bal, PA-C   CC: Here for follow-up  History of Present Illness:    Nichole Reyes is a 72 y.o. female with a hx of the following:  Coronary artery calcification noted on CT scan History of shortness of breath Mixed hyperlipidemia, statin intolerance Hypertension Idiopathic pulmonary fibrosis (follows pulmonary) Type 2 diabetes Hx of DVT/PE  Patient is a 72 year old female with past medical history as mentioned above.  NST at [facility] in 2020 was negative for ischemia.  Last seen by Dr. Domenic Polite on September 14, 2020.  Denied any chest pain or palpitations.  Echocardiogram in April 2022 was reviewed and showed normal EF, mild diastolic dysfunction, mildly sclerotic aortic valve without stenosis.  EKG and office showed sinus rhythm, LVH, decreased R wave progression.  Myoview was ordered and arranged to evaluate ischemic burden, this was normal, low risk and negative for ischemia.  Dr. Domenic Polite discussed referral to lipid clinic potentially for evaluation of PCSK9 inhibitor and other treatment options.  No medication changes were made.  Was told to follow-up based on test results.  Had a fall and was seen at [facility] on March 09, 2022. This was a mechanical fall from her dog causing her to fall.  Was not on blood thinners at the time.  CT imaging showed oblique fracture of the proximal humeral neck with anterior displacement.  Was told to follow-up with Dr. Aline Brochure, orthopedic surgery, outpatient.  Was discharged on Percocet.  Was diagnosed with COVID around Thanksgiving 2023.after recovering from [place], she was found to be short of breath and found to have acute PE.  In the interim, she was admitted at [facility] for treatment of acute PE.  CTA chest  consistent with acute PE with moderate embolic burden, positive for acute PE with CT evidence of right heart strain, consistent with at least submassive PE.  Lower extremity venous Dopplers with right leg thrombus noted.  Echocardiogram revealed normal EF, no RWMA, unable to assess PA pressure, no aortic stenosis.  Treated with IV heparin and transition to p.o. Eliquis.  She was successfully weaned off oxygen.  04/11/2022 - I saw her for follow up. Her breathing was better but noticed dyspnea with exertion and also at rest while talking to me during interview. Denied any chest pain, palpitations, syncope, presyncope, dizziness, orthopnea, PND, swelling or significant weight changes, acute bleeding, or claudication. Had upcoming appts with hematology and lipid clinic. Recommended ED eval, but she declined. proBNP was 404, BMET WNL, and CBC showed minimally elevated WBC and elevated platelets. TTE showed normal EF, mild LVH, grade 1 DD.   04/18/2022 - Saw for follow-up. Breathing was the same. Did note chest tightness, not associate with exertion. Located along the center of her chest, nonradiating, and intermittent in nature, only lasting a few minutes in duration. Low dose aldactone added to medication regimen.  04/28/2022 - Today she presents for follow-up with her husband. CC is "racing heartbeat," started this morning. Breathing has improved some since last visit. Has been out of metoprolol and losartan for a few days. Saw pulmonology last week, and was suspected that DOE was related to clot burden vs worsening IPF. Denies any chest pain, worsening shortness of breath, syncope, presyncope, dizziness, orthopnea, PND, swelling or  significant weight changes, acute bleeding, or claudication. Denies any other questions or concerns.   Past Medical History:  Diagnosis Date   Coronary artery calcification seen on CT scan    Essential hypertension    Fibromyalgia    Idiopathic pulmonary fibrosis (HCC)    Mixed  hyperlipidemia    Osteoporosis    Type 2 diabetes mellitus ([place])     Past Surgical History:  Procedure Laterality Date   ABDOMINAL HYSTERECTOMY  2007   CATARACT EXTRACTION Bilateral     Current Medications: Current Meds  Medication Sig   albuterol (VENTOLIN HFA) 108 (90 Base) MCG/ACT inhaler Inhale 2 puffs into the lungs every 4 (four) hours as needed for wheezing or shortness of breath.   [START ON 04/20/2022] apixaban (ELIQUIS) 5 MG TABS tablet Take 1 tablet (5 mg total) by mouth 2 (two) times daily. Start in 1 month after completing initial Starter pack   Calcium Carbonate (CALTRATE 600 PO) Take 1 tablet by mouth 2 (two) times a day.   clonazePAM (KLONOPIN) 0.5 MG tablet Take 0.5 mg by mouth at bedtime.   cyanocobalamin (VITAMIN B12) 1000 MCG tablet Take 1,000 mcg by mouth daily.   ESBRIET 801 MG TABS Take 801 mg by mouth 3 (three) times daily with meals.   fenofibrate (TRICOR) 48 MG tablet Take 48 mg by mouth at bedtime.   FLOVENT HFA 110 MCG/ACT inhaler INHALE 1 PUFF INTO THE LUNGS 2 TIMES A DAY.   fluticasone (FLONASE) 50 MCG/ACT nasal spray Place 2 sprays into both nostrils daily.   levocetirizine (XYZAL) 5 MG tablet Take 5 mg by mouth daily.   losartan (COZAAR) 50 MG tablet Take 50 mg by mouth daily.   metFORMIN (GLUCOPHAGE-XR) 500 MG 24 hr tablet Take 500 mg by mouth daily with breakfast.    metoprolol succinate (TOPROL-XL) 100 MG 24 hr tablet Take 100 mg by mouth daily. Take with or immediately following a meal.   omeprazole (PRILOSEC) 20 MG capsule TAKE 1 CAPSULE BY MOUTH ONCE DAILY. (Patient taking differently: Take 20 mg by mouth daily.)   Oxycodone HCl 10 MG TABS Take 10 mg by mouth daily as needed (severe pain).   REPATHA SURECLICK [id number] MG/ML SOAJ Inject 1 mL into the skin every 14 (fourteen) days.   SAVELLA 50 MG TABS tablet Take 50 mg by mouth 2 (two) times daily.    tiZANidine (ZANAFLEX) 4 MG tablet TAKE 1 TABLET BY MOUTH AT BEDTIME.     Allergies:   Latex,  Pseudoephedrine, Sulfa antibiotics, and Sulfonamide derivatives   Social History   Socioeconomic History   Marital status: Married    Spouse name: Not on file   Number of children: Not on file   Years of education: Not on file   Highest education level: Not on file  Occupational History   Not on file  Tobacco Use   Smoking status: Never    Passive exposure: Past   Smokeless tobacco: Never  Vaping Use   Vaping Use: Never used  Substance and Sexual Activity   Alcohol use: Not Currently    Comment: ocassional   Drug use: No   Sexual activity: Not on file  Other Topics Concern   Not on file  Social History Narrative   Not on file   Social Determinants of Health   Financial Resource Strain: Not on file  Food Insecurity: No Food Insecurity (03/18/2022)   Hunger Vital Sign    Worried About Running Out of Food in the Last  Year: Never true    [place] in the Last Year: Never true  Transportation Needs: No Transportation Needs (03/18/2022)   PRAPARE - Hydrologist (Medical): No    Lack of Transportation (Non-Medical): No  Physical Activity: Not on file  Stress: Not on file  Social Connections: Not on file     Family History: The patient's family history includes COPD in her father; Cancer in her brother; Colon cancer in her sister; Diabetes in her mother; Emphysema in her paternal grandfather; Heart disease in her brother, brother, and father; Hypertension in her father and mother; Osteoporosis in her mother.  ROS:   Review of Systems  Constitutional: Negative.   HENT: Negative.    Eyes: Negative.   Respiratory:  Positive for shortness of breath. Negative for cough, hemoptysis, sputum production and wheezing.        Improved since last office visit.   Cardiovascular:  Negative for chest pain, palpitations, orthopnea, claudication, leg swelling and PND.  Gastrointestinal: Negative.   Genitourinary: Negative.   Musculoskeletal: Negative.    Skin: Negative.   Neurological: Negative.   Endo/Heme/Allergies: Negative.   Psychiatric/Behavioral: Negative.      Please see the history of present illness.    All other systems reviewed and are negative.  EKGs/Labs/Other Studies Reviewed:    The following studies were reviewed today:   EKG:  EKG is ordered today and demonstrates ST, 110 bpm, LVH, with repolarization abnormality, otherwise nothing acute.   Complete echo on April 14, 2022:  1. Left ventricular ejection fraction, by estimation, is 60 to 65%. The  left ventricle has normal function. The left ventricle has no regional  wall motion abnormalities. There is mild left ventricular hypertrophy.  Left ventricular diastolic parameters  are consistent with Grade I diastolic dysfunction (impaired relaxation).  The average left ventricular global longitudinal strain is -19.9 %. The  global longitudinal strain is normal.   2. Right ventricular systolic function is normal. The right ventricular  size is normal. Tricuspid regurgitation signal is inadequate for assessing  PA pressure.   3. The mitral valve is normal in structure. Trivial mitral valve  regurgitation. No evidence of mitral stenosis.   4. The aortic valve is tricuspid. There is mild calcification of the  aortic valve. Aortic valve regurgitation is not visualized. No aortic  stenosis is present.   Comparison(s): No significant change from prior study.  Limited Echo on 03/19/2022: 1. Left ventricular ejection fraction, by estimation, is 60 to 65%. The  left ventricle has normal function. There is mild left ventricular  hypertrophy.   2. Right ventricular systolic function is normal. The right ventricular  size is normal. Tricuspid regurgitation signal is inadequate for assessing  PA pressure.   3. No evidence of mitral valve regurgitation.   4. Aortic valve sclerosis/calcification is present, without any evidence  of aortic stenosis.   5. The inferior  vena cava is normal in size with greater than 50%  respiratory variability, suggesting right atrial pressure of 3 mmHg.   Comparison(s): No significant change from prior study.  Venous ultrasound of bilateral lower extremities on March 19, 2022: IMPRESSION: 1. Acute deep vein thrombosis limited to the right gastrocnemius calf vein. No evidence of iliofemoral thrombus. 2. No evidence of left lower extremity deep vein thrombosis.  Complete echocardiogram on October 31, 2021: 1. Left ventricular ejection fraction, by estimation, is 60 to 65%. The  left ventricle has normal function. The left ventricle  has no regional  wall motion abnormalities. There is mild left ventricular hypertrophy.  Left ventricular diastolic parameters  are consistent with Grade I diastolic dysfunction (impaired relaxation).   2. Right ventricular systolic function is normal. The right ventricular  size is normal. Tricuspid regurgitation signal is inadequate for assessing  PA pressure.   3. Left atrial size was mildly dilated.   4. The mitral valve is grossly normal. Trivial mitral valve  regurgitation.   5. The aortic valve is tricuspid. Aortic valve regurgitation is not  visualized. Aortic valve sclerosis is present, with no evidence of aortic  valve stenosis.   6. The inferior vena cava is normal in size with greater than 50%  respiratory variability, suggesting right atrial pressure of 3 mmHg.   Comparison(s): No significant change from prior study. Prior images  reviewed side by side.  Myoview on September 24, 2020: Lexiscan stress is electrically negative for ischemia Myoview scan shows normal perfusion No ischemia or scar LVEF calculated at 68% This is a low risk study.  Recent Labs: 11/29/2021: ALT 18 03/17/2022: B Natriuretic Peptide 60.0 03/18/2022: BUN 12; Creatinine, Ser 0.56; Potassium 3.5; Sodium 139 03/20/2022: Hemoglobin 11.3; Platelets 353  Recent Lipid Panel No results found for: "CHOL",  "TRIG", "HDL", "CHOLHDL", "VLDL", "LDLCALC", "LDLDIRECT"   Physical Exam:    VS:  BP 128/70   Pulse (!) 117   Ht 5' 5"$  (1.651 m)   Wt 158 lb 6.4 oz (71.8 kg)   SpO2 95%   BMI 26.36 kg/m     Wt Readings from Last 3 Encounters:  04/28/22 158 lb 6.4 oz (71.8 kg)  04/21/22 159 lb 9.6 oz (72.4 kg)  04/18/22 158 lb 6.4 oz (71.8 kg)     GEN: Well nourished, well developed in no acute distress HEENT: Normal NECK: No JVD; No carotid bruits CARDIAC: S1/S2, RRR, no murmurs, rubs, gallops; 2+ pulses RESPIRATORY:  Clear to auscultation without rales, wheezing or rhonchi  MUSCULOSKELETAL:  No edema; No deformity  SKIN: Warm and dry NEUROLOGIC:  Alert and oriented x 3 PSYCHIATRIC:  Normal affect   ASSESSMENT:    1. Coronary artery calcification seen on CT scan   2. SOB (shortness of breath)   3. IPF (idiopathic pulmonary fibrosis) ([place])   4. Acute pulmonary embolism without acute cor pulmonale, unspecified pulmonary embolism type ([place])   5. Acute deep vein thrombosis (DVT) of distal vein of right lower extremity (HCC)   6. Mixed hyperlipidemia   7. Essential hypertension, benign   8. Tachycardia    PLAN:    In order of problems listed above:  Multivessel coronary artery calcification by chest CT Stable with no anginal symptoms. No indication for ischemic evaluation.  Hx of chronic DOE in setting of idiopathic pulmonary fibrosis. Updated echo 03/2022 showed normal EF.  Mild left ventricular hypertrophy was noted.  Continue fenofibrate, losartan, Toprol-XL, and Repatha.  She has a history of statin intolerance.  Not on aspirin because she is on Eliquis.  Heart healthy diet encouraged. ED precautions discussed.  Continue to follow-up with lipid clinic.  Shortness of breath, idiopathic pulmonary fibrosis, hx of acute PE/DVT Improved SHOB since last visit. History of chronic dyspnea on exertion in setting of idiopathic pulmonary fibrosis.  She follows pulmonary.  Was admitted for acute PE  in December 2023.  CTA chest consistent with acute PE with moderate embolic burden, with CT evidence of right heart strain, consistent with at least submassive PE.  Lower extremity venous Dopplers with right  leg thrombus noted.  TTE 03/2022 revealed normal EF, no RWMA, no aortic stenosis. Tolerating Eliquis well, denies any bleeding issues.  Continue Eliquis as instructed. Breathing has improved since last OV. Continue to follow-up with pulmonology and hematology. ED precautions discussed.   Mixed HLD History of statin intolerance.  She has been previously referred to lipid clinic and has been started on [place].  Continue to follow-up with the lipid clinic.  HTN, tachycardia Blood pressure today stable.  BP well-controlled at home.  Continue current medication regimen. Heart healthy diet and regular cardiovascular exercise as tolerated encouraged. HR elevated today d/t not taking to Metoprolol x several days. Recommended monitoring HR and to notify office if restarting Metoprolol does not improve HR.   5. Disposition: Follow-up with Dr. Domenic Polite or me in 6 months or sooner if anything changes.   Medication Adjustments/Labs and Tests Ordered: Current medicines are reviewed at length with the patient today.  Concerns regarding medicines are outlined above.  Orders Placed This Encounter  Procedures   EKG 12-Lead   No orders of the defined types were placed in this encounter.   Patient Instructions  Medication Instructions:  Continue all current medications.   Labwork: none  Testing/Procedures: none  Follow-Up: 6 months   Any Other Special Instructions Will Be Listed Below (If Applicable).   If you need a refill on your cardiac medications before your next appointment, please call your pharmacy.     Signed, Finis Bud, NP  04/29/2022 8:57 PM    [place]

## 2022-05-01 DIAGNOSIS — S49092A Other physeal fracture of upper end of humerus, left arm, initial encounter for closed fracture: Secondary | ICD-10-CM | POA: Diagnosis not present

## 2022-05-04 DIAGNOSIS — S49092A Other physeal fracture of upper end of humerus, left arm, initial encounter for closed fracture: Secondary | ICD-10-CM | POA: Diagnosis not present

## 2022-05-04 NOTE — Progress Notes (Signed)
[facility] [address], [place] [address]   Clinic Day:  05/05/2022  Referring physician: Lanelle Bal, PA-C  Patient Care Team: Nichole Bal, PA-C as PCP - General (Family Medicine) Nichole Sark, MD as PCP - Cardiology (Cardiology) Nichole Jack, MD as Medical Oncologist (Hematology)   ASSESSMENT & PLAN:   Assessment:  1.  DVT and pulmonary embolism, provoked: - COVID infection 02/11/2022, fever and taste altered, treated with Paxlovid - Left humerus fracture (03/09/2022) from mechanical fall while walking her dog.  After the fracture, she was wearing a sling and mostly stayed on the couch. - CT angio (03/17/2022): Acute PE with moderate embolic burden.  Evidence of right heart strain consistent with at least submassive PE. - Doppler (03/19/2022): Acute DVT limited to the right gastrocnemius calf vein.  No evidence of left lower extremity DVT.  2.  Social/family history: - Lives at home with her husband.  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  Later she went on disability secondary to fibromyalgia.  She is non-smoker. - No family history of DVTs or miscarriages.  Sister died of colon cancer.  Brother had colon cancer.  3.  Pulmonary fibrosis: - She is on pirfenidone.  She lost 10 pounds since she was started on it. - Uses Flovent twice daily and albuterol.  Plan:  1.  Provoked DVT and PE: - It appears that she has at least 2 provoking factors including COVID infection and immobility from fall and left arm fracture. - She is tolerating Eliquis very well. - Recommend testing for lupus anticoagulant, anticardiolipin antibodies, antibeta 2 glycoprotein 1 antibodies and a D-dimer.  Will also check factor V Leiden and prothrombin gene mutation. - Recommend follow-up in 4 weeks. - I have recommended anticoagulation for 6 months given the clot load.  She may have a CT scan in June and if the clot resolves, she may come off of  anticoagulation provided antiphospholipid antibodies are negative.   Orders Placed This Encounter  Procedures   Lupus anticoagulant panel    Standing Status:   Future    Number of Occurrences:   1    Standing Expiration Date:   05/05/2023   Cardiolipin antibodies, IgG, IgM, IgA    Standing Status:   Future    Number of Occurrences:   1    Standing Expiration Date:   05/05/2023   Beta-2-glycoprotein i abs, IgG/M/A    Standing Status:   Future    Number of Occurrences:   1    Standing Expiration Date:   05/05/2023   D-dimer, quantitative    Standing Status:   Future    Number of Occurrences:   1    Standing Expiration Date:   05/05/2023   Factor 5 leiden    Standing Status:   Future    Number of Occurrences:   1    Standing Expiration Date:   05/05/2023   Prothrombin gene mutation    Standing Status:   Future    Number of Occurrences:   1    Standing Expiration Date:   05/05/2023      Nichole Reyes as a scribe for Nichole Jack, MD.,have documented all relevant documentation on the behalf of Nichole Jack, MD,as directed by  Nichole Jack, MD while in the presence of Nichole Jack, MD.   I, Nichole Jack MD, have reviewed the above documentation for accuracy and completeness, and I agree with the above.  Nichole Reyes   2/16/202411:28 AM  CHIEF COMPLAINT/PURPOSE OF CONSULT:   Diagnosis: DVT  Current Therapy:  Working up  HISTORY OF PRESENT ILLNESS:   Nichole Reyes is a 72 y.o. female presenting to clinic today for evaluation of DVT at the request of PA Nichole Reyes.  Today, she states that she is doing well overall. Her appetite level is at 70%. Her energy level is at 70%.She had a left arm fracture and she had limited movement with the fracture. She has 3/10 shoulder pain.   She denies any previous blood clots. She had Covid around 02/11/2022. She had occasional shortness of breath with Covid and she could not taste her food. She  had no limited movements with Covid.   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   She denies smoking history. She denies any miscarriages.  She denies family blood clots history.  Her mom and dad had heart problems. Her older sister had colo cancer. Her older brother had colon cancer.   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  PAST MEDICAL HISTORY:   Past Medical History: Past Medical History:  Diagnosis Date   Coronary artery calcification seen on CT scan    Essential hypertension    Fibromyalgia    Idiopathic pulmonary fibrosis (HCC)    Mixed hyperlipidemia    Osteoporosis    Type 2 diabetes mellitus ([facility])     Surgical History: Past Surgical History:  Procedure Laterality Date   ABDOMINAL HYSTERECTOMY  2007   CATARACT EXTRACTION Bilateral     Social History: Social History   Socioeconomic History   Marital status: Married    Spouse name: Not on file   Number of children: Not on file   Years of education: Not on file   Highest education level: Not on file  Occupational History   Not on file  Tobacco Use   Smoking status: Never    Passive exposure: Past   Smokeless tobacco: Never  Vaping Use   Vaping Use: Never used  Substance and Sexual Activity   Alcohol use: Not Currently    Comment: ocassional   Drug use: No   Sexual activity: Not on file  Other Topics Concern   Not on file  Social History Narrative   She is retired from Scientist, 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   Social Determinants of Health   Financial Resource Strain: Not on file  Food Insecurity: No Food Insecurity (05/05/2022)   Hunger Vital Sign    Worried About Running Out of Food in the Last Year: Never true    Ran Out of Food in the Last Year: Never true  Transportation Needs: No Transportation Needs (05/05/2022)   PRAPARE - Hydrologist (Medical): No    Lack of Transportation (Non-Medical): No  Physical Activity: Not on file   Stress: Not on file  Social Connections: Not on file  Intimate Partner Violence: Not At Risk (05/05/2022)   Humiliation, Afraid, Rape, and Kick questionnaire    Fear of Current or Ex-Partner: No    Emotionally Abused: No    Physically Abused: No    Sexually Abused: No    Family History: Family History  Problem Relation Age of Onset   Hypertension Mother    Diabetes Mother    Osteoporosis Mother    Hypertension Father    Heart disease Father    COPD Father    Colon cancer Sister  Colon cancer Brother    Heart disease Brother    Heart disease Brother    Emphysema Paternal Grandfather     Current Medications:  Current Outpatient Medications:    ACCU-CHEK AVIVA PLUS test strip, , Disp: , Rfl:    Accu-Chek Softclix Lancets lancets, , Disp: , Rfl:    albuterol (VENTOLIN HFA) 108 (90 Base) MCG/ACT inhaler, Inhale 2 puffs into the lungs every 4 (four) hours as needed for wheezing or shortness of breath., Disp: , Rfl:    apixaban (ELIQUIS) 5 MG TABS tablet, Take 1 tablet (5 mg total) by mouth 2 (two) times daily. Start in 1 month after completing initial Starter pack, Disp: 60 tablet, Rfl: 4   Calcium Carbonate (CALTRATE 600 PO), Take 1 tablet by mouth 2 (two) times a day., Disp: , Rfl:    clonazePAM (KLONOPIN) 0.5 MG tablet, Take 0.5 mg by mouth at bedtime., Disp: , Rfl:    cyanocobalamin (VITAMIN B12) 1000 MCG tablet, Take 1,000 mcg by mouth daily., Disp: , Rfl:    ESBRIET 801 MG TABS, Take 801 mg by mouth 3 (three) times daily with meals., Disp: 270 tablet, Rfl: 1   fenofibrate (TRICOR) 48 MG tablet, Take 48 mg by mouth at bedtime., Disp: , Rfl:    FLOVENT HFA 110 MCG/ACT inhaler, INHALE 1 PUFF INTO THE LUNGS 2 TIMES A DAY., Disp: 12 g, Rfl: 0   fluticasone (FLONASE) 50 MCG/ACT nasal spray, Place 2 sprays into both nostrils daily., Disp: , Rfl:    levocetirizine (XYZAL) 5 MG tablet, Take 5 mg by mouth daily., Disp: , Rfl:    losartan (COZAAR) 50 MG tablet, Take 50 mg by mouth  daily., Disp: , Rfl:    metFORMIN (GLUCOPHAGE-XR) 500 MG 24 hr tablet, Take 500 mg by mouth daily with breakfast. , Disp: , Rfl:    metoprolol succinate (TOPROL-XL) 100 MG 24 hr tablet, Take 100 mg by mouth daily. Take with or immediately following a meal., Disp: , Rfl:    omeprazole (PRILOSEC) 20 MG capsule, TAKE 1 CAPSULE BY MOUTH ONCE DAILY. (Patient taking differently: Take 20 mg by mouth daily.), Disp: 30 capsule, Rfl: 5   REPATHA SURECLICK [id number] MG/ML SOAJ, Inject 1 mL into the skin every 14 (fourteen) days., Disp: , Rfl:    SAVELLA 50 MG TABS tablet, Take 50 mg by mouth 2 (two) times daily. , Disp: , Rfl:    spironolactone (ALDACTONE) 25 MG tablet, Take 0.5 tablets (12.5 mg total) by mouth daily., Disp: 45 tablet, Rfl: 1   tiZANidine (ZANAFLEX) 4 MG tablet, TAKE 1 TABLET BY MOUTH AT BEDTIME., Disp: 30 tablet, Rfl: 0   Allergies: Allergies  Allergen Reactions   Latex    Pseudoephedrine Other (See Comments)    Rash and hallucinations    Sulfa Antibiotics    Sulfonamide Derivatives Rash    REVIEW OF SYSTEMS:   Review of Systems  Constitutional:  Negative for chills, fatigue and fever.  HENT:   Negative for lump/mass, mouth sores, nosebleeds, sore throat and trouble swallowing.   Eyes:  Negative for eye problems.  Respiratory:  Positive for cough and shortness of breath.   Cardiovascular:  Positive for palpitations (At times). Negative for chest pain and leg swelling.  Gastrointestinal:  Negative for abdominal pain, constipation, diarrhea, nausea and vomiting.  Genitourinary:  Positive for frequency. Negative for bladder incontinence, difficulty urinating, dysuria, hematuria and nocturia.   Musculoskeletal:  Negative for arthralgias, back pain, flank pain, myalgias and neck pain.  Skin:  Negative for itching and rash.  Neurological:  Negative for dizziness, headaches and numbness.  Hematological:  Does not bruise/bleed easily.  Psychiatric/Behavioral:  Positive for sleep disturbance  (Staying asleep). Negative for depression and suicidal ideas. The patient is not nervous/anxious.   All other systems reviewed and are negative.    VITALS:   Blood pressure 120/68, pulse 80, temperature 98.7 F (37.1 C), temperature source Oral, resp. rate 17, height 5' 5"$  (1.651 m), weight 157 lb 11.2 oz (71.5 kg), SpO2 100 %.  Wt Readings from Last 3 Encounters:  05/05/22 157 lb 11.2 oz (71.5 kg)  04/28/22 158 lb 6.4 oz (71.8 kg)  04/21/22 159 lb 9.6 oz (72.4 kg)    Body mass index is 26.24 kg/m.   PHYSICAL EXAM:   Physical Exam Vitals and nursing note reviewed. Exam conducted with a chaperone present.  Constitutional:      Appearance: Normal appearance.  Cardiovascular:     Rate and Rhythm: Normal rate and regular rhythm.     Pulses: Normal pulses.     Heart sounds: Normal heart sounds.  Pulmonary:     Effort: Pulmonary effort is normal.     Breath sounds: Normal breath sounds.  Abdominal:     Palpations: Abdomen is soft. There is no hepatomegaly, splenomegaly or mass.     Tenderness: There is no abdominal tenderness.  Musculoskeletal:     Right lower leg: No edema.     Left lower leg: No edema.  Lymphadenopathy:     Cervical: No cervical adenopathy.     Right cervical: No superficial, deep or posterior cervical adenopathy.    Left cervical: No superficial, deep or posterior cervical adenopathy.     Upper Body:     Right upper body: No supraclavicular or axillary adenopathy.     Left upper body: No supraclavicular or axillary adenopathy.  Neurological:     General: No focal deficit present.     Mental Status: She is alert and oriented to person, place, and time.  Psychiatric:        Mood and Affect: Mood normal.        Behavior: Behavior normal.     LABS:      Latest Ref Rng & Units 03/20/2022    4:34 AM 03/19/2022    3:58 AM 03/18/2022    6:05 AM  CBC  WBC 4.0 - 10.5 K/uL 9.5  11.2  15.1   Hemoglobin 12.0 - 15.0 g/dL 11.3  11.3  11.5   Hematocrit 36.0 -  46.0 % 34.9  36.3  35.7   Platelets 150 - 400 K/uL 353  270  386       Latest Ref Rng & Units 03/18/2022    6:05 AM 03/17/2022    3:10 PM 02/28/2022    3:07 PM  CMP  Glucose 70 - 99 mg/dL 153  133    BUN 8 - 23 mg/dL 12  11    Creatinine 0.44 - 1.00 mg/dL 0.56  0.66    Sodium 135 - 145 mmol/L 139  139    Potassium 3.5 - 5.1 mmol/L 3.5  3.4    Chloride 98 - 111 mmol/L 107  104    CO2 22 - 32 mmol/L 23  26    Calcium 8.9 - 10.3 mg/dL 9.5  9.6  9.8   Alkaline Phos 39 - 117 U/L   75      No results found for: "CEA1", "CEA" / No results found  for: "CEA1", "CEA" No results found for: "PSA1" No results found for: "[id number]" No results found for: "CAN125"  No results found for: "TOTALPROTELP", "ALBUMINELP", "A1GS", "A2GS", "BETS", "BETA2SER", "GAMS", "MSPIKE", "SPEI" No results found for: "TIBC", "FERRITIN", "IRONPCTSAT" No results found for: "LDH"   STUDIES:   ECHOCARDIOGRAM COMPLETE  Result Date: 04/14/2022    ECHOCARDIOGRAM REPORT   Patient Name:   Nichole Reyes Self Date of Exam: 04/13/2022 Medical Rec #:  [id number]         Height:       65.0 in Accession #:    [id number]        Weight:       157.8 lb Date of Birth:  [birth year]         BSA:          1.789 m Patient Age:    4 years          BP:           130/68 mmHg Patient Gender: F                 HR:           76 bpm. Exam Location:  [facility] Procedure: 2D Echo, Cardiac Doppler, Color Doppler and Strain Analysis Indications:    R06.02 SOB  History:        Patient has prior history of Echocardiogram examinations, most                 recent 03/19/2022. Abnormal ECG, Covid, Pulmonary embolism,                 Signs/Symptoms:Shortness of Breath; Risk Factors:Non-Smoker,                 Hypertension, Diabetes and Dyslipidemia.  Sonographer:    Jeneen Montgomery RDMS, RVT, RDCS Referring Phys: [id number] [facility]  Sonographer Comments: Image acquisition challenging due to patient body habitus. Patient unable to raise left arm or turn on left  side due to left humural fracture. IMPRESSIONS  1. Left ventricular ejection fraction, by estimation, is 60 to 65%. The left ventricle has normal function. The left ventricle has no regional wall motion abnormalities. There is mild left ventricular hypertrophy. Left ventricular diastolic parameters are consistent with Grade I diastolic dysfunction (impaired relaxation). The average left ventricular global longitudinal strain is -19.9 %. The global longitudinal strain is normal.  2. Right ventricular systolic function is normal. The right ventricular size is normal. Tricuspid regurgitation signal is inadequate for assessing PA pressure.  3. The mitral valve is normal in structure. Trivial mitral valve regurgitation. No evidence of mitral stenosis.  4. The aortic valve is tricuspid. There is mild calcification of the aortic valve. Aortic valve regurgitation is not visualized. No aortic stenosis is present. Comparison(s): No significant change from prior study. FINDINGS  Left Ventricle: Left ventricular ejection fraction, by estimation, is 60 to 65%. The left ventricle has normal function. The left ventricle has no regional wall motion abnormalities. The average left ventricular global longitudinal strain is -19.9 %. The global longitudinal strain is normal. The left ventricular internal cavity size was normal in size. There is mild left ventricular hypertrophy. Left ventricular diastolic parameters are consistent with Grade I diastolic dysfunction (impaired relaxation). Right Ventricle: The right ventricular size is normal. No increase in right ventricular wall thickness. Right ventricular systolic function is normal. Tricuspid regurgitation signal is inadequate for assessing PA pressure. Left Atrium: Left atrial size was normal in size. Right Atrium: Right  atrial size was normal in size. Pericardium: There is no evidence of pericardial effusion. Mitral Valve: The mitral valve is normal in structure. Trivial mitral  valve regurgitation. No evidence of mitral valve stenosis. Tricuspid Valve: The tricuspid valve is normal in structure. Tricuspid valve regurgitation is not demonstrated. No evidence of tricuspid stenosis. Aortic Valve: The aortic valve is tricuspid. There is mild calcification of the aortic valve. Aortic valve regurgitation is not visualized. No aortic stenosis is present. Aortic valve peak gradient measures 8.5 mmHg. Pulmonic Valve: The pulmonic valve was normal in structure. Pulmonic valve regurgitation is not visualized. No evidence of pulmonic stenosis. Aorta: The aortic root is normal in size and structure. Venous: The inferior vena cava was not well visualized. IAS/Shunts: No atrial level shunt detected by color flow Doppler.  LEFT VENTRICLE PLAX 2D LVIDd:         4.20 cm     Diastology LVIDs:         2.50 cm     LV e' medial:    4.13 cm/s LV PW:         1.20 cm     LV E/e' medial:  17.2 LV IVS:        1.10 cm     LV e' lateral:   5.77 cm/s LVOT diam:     1.80 cm     LV E/e' lateral: 12.3 LV SV:         62 LV SV Index:   35          2D Longitudinal Strain LVOT Area:     2.54 cm    2D Strain GLS (A2C):   -23.6 %                            2D Strain GLS (A3C):   -18.6 %                            2D Strain GLS (A4C):   -17.5 % LV Volumes (MOD)           2D Strain GLS Avg:     -19.9 % LV vol d, MOD A2C: 60.1 ml LV vol d, MOD A4C: 45.8 ml LV vol s, MOD A2C: 18.5 ml LV vol s, MOD A4C: 15.2 ml LV SV MOD A2C:     41.6 ml LV SV MOD A4C:     45.8 ml LV SV MOD BP:      38.0 ml RIGHT VENTRICLE RV S prime:     10.70 cm/s TAPSE (M-mode): 2.0 cm LEFT ATRIUM             Index        RIGHT ATRIUM           Index LA diam:        3.40 cm 1.90 cm/m   RA Area:     10.52 cm LA Vol (A2C):   51.5 ml 28.79 ml/m  RA Volume:   22.80 ml  12.75 ml/m LA Vol (A4C):   57.4 ml 32.09 ml/m LA Biplane Vol: 56.5 ml 31.59 ml/m  AORTIC VALVE AV Area (Vmax): 1.83 cm AV Vmax:        146.00 cm/s AV Peak Grad:   8.5 mmHg LVOT Vmax:       105.00 cm/s LVOT Vmean:     75.500 cm/s LVOT VTI:       0.243  m  AORTA Ao Root diam: 2.90 cm MITRAL VALVE                TRICUSPID VALVE MV Area (PHT): 2.30 cm     TR Peak grad:   15.8 mmHg MV Decel Time: 330 msec     TR Vmax:        199.00 cm/s MR Peak grad: 44.4 mmHg MR Vmax:      333.00 cm/s   SHUNTS MV E velocity: 71.10 cm/s   Systemic VTI:  0.24 m MV A velocity: 103.00 cm/s  Systemic Diam: 1.80 cm MV E/A ratio:  0.69 Vishnu Priya Mallipeddi Electronically signed by Lorelee Cover Mallipeddi Signature Date/Time: 04/14/2022/7:24:37 AM    Final

## 2022-05-05 ENCOUNTER — Inpatient Hospital Stay: Payer: Medicare Other | Attending: Hematology | Admitting: Hematology

## 2022-05-05 ENCOUNTER — Encounter: Payer: Self-pay | Admitting: Hematology

## 2022-05-05 ENCOUNTER — Other Ambulatory Visit: Payer: Self-pay | Admitting: Physician Assistant

## 2022-05-05 ENCOUNTER — Inpatient Hospital Stay: Payer: Medicare Other

## 2022-05-05 VITALS — BP 120/68 | HR 80 | Temp 98.7°F | Resp 17 | Ht 65.0 in | Wt 157.7 lb

## 2022-05-05 DIAGNOSIS — Z8616 Personal history of COVID-19: Secondary | ICD-10-CM | POA: Diagnosis not present

## 2022-05-05 DIAGNOSIS — Z86711 Personal history of pulmonary embolism: Secondary | ICD-10-CM | POA: Insufficient documentation

## 2022-05-05 DIAGNOSIS — Z7951 Long term (current) use of inhaled steroids: Secondary | ICD-10-CM

## 2022-05-05 DIAGNOSIS — E119 Type 2 diabetes mellitus without complications: Secondary | ICD-10-CM

## 2022-05-05 DIAGNOSIS — I2699 Other pulmonary embolism without acute cor pulmonale: Secondary | ICD-10-CM

## 2022-05-05 DIAGNOSIS — I82461 Acute embolism and thrombosis of right calf muscular vein: Secondary | ICD-10-CM

## 2022-05-05 DIAGNOSIS — Z833 Family history of diabetes mellitus: Secondary | ICD-10-CM | POA: Insufficient documentation

## 2022-05-05 DIAGNOSIS — Z825 Family history of asthma and other chronic lower respiratory diseases: Secondary | ICD-10-CM | POA: Insufficient documentation

## 2022-05-05 DIAGNOSIS — Z90710 Acquired absence of both cervix and uterus: Secondary | ICD-10-CM | POA: Diagnosis not present

## 2022-05-05 DIAGNOSIS — Z8269 Family history of other diseases of the musculoskeletal system and connective tissue: Secondary | ICD-10-CM | POA: Insufficient documentation

## 2022-05-05 DIAGNOSIS — E782 Mixed hyperlipidemia: Secondary | ICD-10-CM | POA: Diagnosis not present

## 2022-05-05 DIAGNOSIS — J84112 Idiopathic pulmonary fibrosis: Secondary | ICD-10-CM

## 2022-05-05 DIAGNOSIS — M25519 Pain in unspecified shoulder: Secondary | ICD-10-CM

## 2022-05-05 DIAGNOSIS — I10 Essential (primary) hypertension: Secondary | ICD-10-CM | POA: Insufficient documentation

## 2022-05-05 DIAGNOSIS — I2510 Atherosclerotic heart disease of native coronary artery without angina pectoris: Secondary | ICD-10-CM

## 2022-05-05 DIAGNOSIS — Z8262 Family history of osteoporosis: Secondary | ICD-10-CM | POA: Diagnosis not present

## 2022-05-05 DIAGNOSIS — E785 Hyperlipidemia, unspecified: Secondary | ICD-10-CM

## 2022-05-05 DIAGNOSIS — Z8249 Family history of ischemic heart disease and other diseases of the circulatory system: Secondary | ICD-10-CM | POA: Diagnosis not present

## 2022-05-05 DIAGNOSIS — J8410 Pulmonary fibrosis, unspecified: Secondary | ICD-10-CM | POA: Insufficient documentation

## 2022-05-05 DIAGNOSIS — Z79899 Other long term (current) drug therapy: Secondary | ICD-10-CM | POA: Diagnosis not present

## 2022-05-05 DIAGNOSIS — R0602 Shortness of breath: Secondary | ICD-10-CM | POA: Diagnosis not present

## 2022-05-05 DIAGNOSIS — Z800 Family history of malignant neoplasm of digestive organs: Secondary | ICD-10-CM | POA: Diagnosis not present

## 2022-05-05 DIAGNOSIS — I824Z1 Acute embolism and thrombosis of unspecified deep veins of right distal lower extremity: Secondary | ICD-10-CM

## 2022-05-05 DIAGNOSIS — Z882 Allergy status to sulfonamides status: Secondary | ICD-10-CM | POA: Insufficient documentation

## 2022-05-05 LAB — D-DIMER, QUANTITATIVE: D-Dimer, Quant: 0.27 ug/mL-FEU (ref 0.00–0.50)

## 2022-05-05 NOTE — Telephone Encounter (Signed)
Next Visit: 07/18/2022  Last Visit: 01/10/2022  Last Fill:04/03/2022  Dx: Other insomnia   Current Dose per office note on 01/10/2022: zanaflex 4 mg at bedtime for muscle spasms and insomnia.   Okay to refill Zanaflex?

## 2022-05-05 NOTE — Patient Instructions (Addendum)
[facility]  Discharge Instructions  You were seen and examined today by Dr. Delton Coombes. Dr. Delton Coombes is a hematologist, meaning that he specializes in blood abnormalities. Dr. Delton Coombes discussed your past medical history, family history of cancers/blood conditions and the events that led to you being here today.  You were referred to Dr. Delton Coombes due to a PE and DVT (bloot clots in your lungs and leg). This could have been provoked by the COVID-19 infection or being sedentary after your shoulder fracture.  Continue Eliquis as prescribed.  Dr. Delton Coombes has recommended additional labs.  Follow-up as scheduled.  Thank you for choosing [facility] to provide your oncology and hematology care.   To afford each patient quality time with our provider, please arrive at least 15 minutes before your scheduled appointment time. You may need to reschedule your appointment if you arrive late (10 or more minutes). Arriving late affects you and other patients whose appointments are after yours.  Also, if you miss three or more appointments without notifying the office, you may be dismissed from the clinic at the provider's discretion.    Again, thank you for choosing [facility].  Our hope is that these requests will decrease the amount of time that you wait before being seen by our physicians.   If you have a lab appointment with the [facility] please come in thru the Main Entrance and check in at the main information desk.           _____________________________________________________________  Should you have questions after your visit to [facility], please contact our office at [phone] and follow the prompts.  Our office hours are 8:00 a.m. to 4:30 p.m. Monday - Thursday and 8:00 a.m. to 2:30 p.m. Friday.  Please note that voicemails left after 4:00 p.m. may not be returned until the following business  day.  We are closed weekends and all major holidays.  You do have access to a nurse 24-7, just call the main number to the clinic [phone] and do not press any options, hold on the line and a nurse will answer the phone.    For prescription refill requests, have your pharmacy contact our office and allow 72 hours.    Masks are optional in the cancer centers. If you would like for your care team to wear a mask while they are taking care of you, please let them know. You may have one support person who is at least 70 years old accompany you for your appointments.

## 2022-05-06 LAB — DRVVT MIX: dRVVT Mix: 53.6 s — ABNORMAL HIGH (ref 0.0–40.4)

## 2022-05-06 LAB — DRVVT CONFIRM: dRVVT Confirm: 1 ratio (ref 0.8–1.2)

## 2022-05-06 LAB — LUPUS ANTICOAGULANT PANEL
DRVVT: 69.2 s — ABNORMAL HIGH (ref 0.0–47.0)
PTT Lupus Anticoagulant: 32.7 s (ref 0.0–43.5)

## 2022-05-07 LAB — BETA-2-GLYCOPROTEIN I ABS, IGG/M/A
Beta-2 Glyco I IgG: <9 GPI IgG units (ref 0–20)
Beta-2-Glycoprotein I IgA: 10 GPI IgA units (ref 0–25)
Beta-2-Glycoprotein I IgM: <9 GPI IgM units (ref 0–32)

## 2022-05-08 ENCOUNTER — Telehealth: Payer: Self-pay

## 2022-05-08 DIAGNOSIS — S49092A Other physeal fracture of upper end of humerus, left arm, initial encounter for closed fracture: Secondary | ICD-10-CM | POA: Diagnosis not present

## 2022-05-08 LAB — CARDIOLIPIN ANTIBODIES, IGG, IGM, IGA
Anticardiolipin IgA: <9 APL U/mL (ref 0–11)
Anticardiolipin IgG: <9 GPL U/mL (ref 0–14)
Anticardiolipin IgM: 15 MPL U/mL — ABNORMAL HIGH (ref 0–12)

## 2022-05-08 NOTE — Telephone Encounter (Signed)
Received fax from [place] stating that pt's consent form on file expired on 05/05/22 and that they have been unable to contact the pt after multiple attempts.  ATC pt and LVM requesting return call to complete consent form. Direct office number provided, will await f/u.

## 2022-05-10 NOTE — Telephone Encounter (Signed)
Spoke with patient - she states she completed consent form from [employer] last week and mailed back to company directly. Nothing further needed from end.  Knox Saliva, PharmD, MPH, BCPS, CPP Clinical Pharmacist (Rheumatology and Pulmonology)

## 2022-05-15 DIAGNOSIS — S49092A Other physeal fracture of upper end of humerus, left arm, initial encounter for closed fracture: Secondary | ICD-10-CM | POA: Diagnosis not present

## 2022-05-15 LAB — FACTOR 5 LEIDEN

## 2022-05-17 LAB — PROTHROMBIN GENE MUTATION

## 2022-05-18 ENCOUNTER — Encounter: Payer: Self-pay | Admitting: Radiology

## 2022-05-19 ENCOUNTER — Ambulatory Visit: Payer: Medicare Other | Admitting: Orthopedic Surgery

## 2022-05-19 ENCOUNTER — Ambulatory Visit (INDEPENDENT_AMBULATORY_CARE_PROVIDER_SITE_OTHER): Payer: Medicare Other

## 2022-05-19 DIAGNOSIS — S42292D Other displaced fracture of upper end of left humerus, subsequent encounter for fracture with routine healing: Secondary | ICD-10-CM

## 2022-05-19 DIAGNOSIS — S42202D Unspecified fracture of upper end of left humerus, subsequent encounter for fracture with routine healing: Secondary | ICD-10-CM | POA: Insufficient documentation

## 2022-05-19 NOTE — Progress Notes (Signed)
Chief Complaint  Patient presents with   Follow-up    Recheck on left shoulder, DOI 03-09-22.   Encounter Diagnosis  Name Primary?   Other closed displaced fracture of proximal end of left humerus with routine healing, subsequent encounter 03/24/22 Yes   XRAYS TODAY  THE FRX HEALED WITH MIN DISPL AND MILD ANGUL  Left Shoulder Exam   Tenderness  The patient is experiencing no tenderness.   Range of Motion  Active abduction:  80  Passive abduction:  90  Left shoulder forward flexion: 115.   Muscle Strength  Abduction: 4/5  Internal rotation: 5/5  External rotation: 5/5   Other  Erythema: absent Sensation: normal Pulse: present       Patsye did well her x-ray shows fracture healing as stated.  She is disappointed that she does not have better range of motion but I do not see anything out of the ordinary in terms of the loss of motion.  I think she has a few degrees of flexion she could still obtain if she works on cane exercises  We are releasing her today any problems she is welcome to call us or schedule new appointment

## 2022-05-24 ENCOUNTER — Ambulatory Visit (HOSPITAL_COMMUNITY)
Admission: RE | Admit: 2022-05-24 | Discharge: 2022-05-24 | Disposition: A | Discharge status: Home / Self Care | Payer: Medicare Other | Source: Ambulatory Visit | Attending: Nurse Practitioner | Admitting: Nurse Practitioner

## 2022-05-24 DIAGNOSIS — J84112 Idiopathic pulmonary fibrosis: Secondary | ICD-10-CM | POA: Insufficient documentation

## 2022-05-24 DIAGNOSIS — J8410 Pulmonary fibrosis, unspecified: Secondary | ICD-10-CM | POA: Diagnosis not present

## 2022-05-24 DIAGNOSIS — J479 Bronchiectasis, uncomplicated: Secondary | ICD-10-CM | POA: Diagnosis not present

## 2022-05-31 NOTE — Progress Notes (Signed)
[facility] [address], [place] [address]   CLINIC:  Medical Oncology/Hematology  PCP:  Lianne Moris, PA-C [address] [place] [address] [phone]   REASON FOR VISIT:  Follow-up for provoked right leg DVT and PE  CURRENT THERAPY: Eliquis  INTERVAL HISTORY:   [name] 72 y.o. female returns for routine follow-up of her provoked right leg DVT and PE.  She was seen for initial consultation with Dr. Ellin Saba on 05/05/2022.  She continues to take Eliquis twice daily and is tolerating this well.  She has occasional epistaxis lasting less than 5 minutes, but reports that this was chronic before starting Eliquis.  She denies any hematochezia, melena, hematuria, hematemesis.  She has baseline shortness of breath and dyspnea related to her pulmonary fibrosis.  She denies any chest pain, hemoptysis, palpitations, or lightheadedness.  No unilateral leg swelling or pain.  She has 75% energy and 50% appetite. She endorses that she is maintaining a stable weight.   ASSESSMENT & PLAN:  1.  DVT and pulmonary embolism, provoked: - COVID infection 02/11/2022, fever and taste altered, treated with Paxlovid - Left humerus fracture (03/09/2022) from mechanical fall while walking her dog.  After the fracture, she was wearing a sling and mostly stayed on the couch. - CT angio (03/17/2022): Acute PE with moderate embolic burden.  Evidence of right heart strain consistent with at least submassive PE. - Doppler (03/19/2022): Acute DVT limited to the right gastrocnemius calf vein.  No evidence of left lower extremity DVT. - It appears that she has at least 2 provoking factors including COVID infection and immobility from fall and left arm fracture. - She is tolerating Eliquis very well. - Coagulopathy testing was negative (checked for lupus anticoagulant, anticardiolipin antibodies, antibeta 2 glycoprotein 1 antibodies, factor V Leiden, prothrombin gene mutation) - Per Dr.  Ellin Saba, anticoagulation for 6 months duration recommended due to clot load.  Would consider discontinuing anticoagulation if evidence of clot resolution on follow-up imaging. - PLAN: We will check RLE [place] and CTA chest in 4 months.  Labs in 4 months (CBC/D, CMP, Dimer).  Office visit after labs and imaging  2.  Pulmonary fibrosis: - She is on pirfenidone.  She lost 10 pounds since she was started on it. - Uses Flovent twice daily and albuterol.  3.  Social/family history: - Lives at home with her husband.  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  Later she went on disability secondary to fibromyalgia.  She is non-smoker. - No family history of DVTs or miscarriages.  Sister died of colon cancer.  Brother had colon cancer.  PLAN SUMMARY: >> Right leg venous US in 4 months >> CTA chest in 4 months >> Labs in 4 months = CBC/D, CMP, Dimer >> OFFICE visit 1 week after labs/imaging     REVIEW OF SYSTEMS:   Review of Systems  Constitutional:  Positive for fatigue. Negative for appetite change, chills, diaphoresis, fever and unexpected weight change.  HENT:   Negative for lump/mass and nosebleeds.   Eyes:  Negative for eye problems.  Respiratory:  Negative for cough, hemoptysis and shortness of breath.   Cardiovascular:  Negative for chest pain, leg swelling and palpitations.  Gastrointestinal:  Negative for abdominal pain, blood in stool, constipation, diarrhea, nausea and vomiting.  Genitourinary:  Positive for frequency. Negative for hematuria.   Skin: Negative.   Neurological:  Negative for dizziness, headaches and light-headedness.  Hematological:  Does not  bruise/bleed easily.  Psychiatric/Behavioral:  Positive for sleep disturbance.      PHYSICAL EXAM:  ECOG PERFORMANCE STATUS: 0 - Asymptomatic  There were no vitals filed for this visit. There were no vitals filed for this visit. Physical Exam Constitutional:      Appearance: Normal appearance. She is normal  weight.  Cardiovascular:     Heart sounds: Normal heart sounds.  Pulmonary:     Breath sounds: Normal breath sounds. Decreased air movement present.  Neurological:     General: No focal deficit present.     Mental Status: Mental status is at baseline.  Psychiatric:        Behavior: Behavior normal. Behavior is cooperative.     PAST MEDICAL/SURGICAL HISTORY:  Past Medical History:  Diagnosis Date   Coronary artery calcification seen on CT scan    Essential hypertension    Fibromyalgia    Idiopathic pulmonary fibrosis (HCC)    Mixed hyperlipidemia    Osteoporosis    Type 2 diabetes mellitus (HCC)    Past Surgical History:  Procedure Laterality Date   ABDOMINAL HYSTERECTOMY  2007   CATARACT EXTRACTION Bilateral     SOCIAL HISTORY:  Social History   Socioeconomic History   Marital status: Married    Spouse name: Not on file   Number of children: Not on file   Years of education: Not on file   Highest education level: Not on file  Occupational History   Not on file  Tobacco Use   Smoking status: Never    Passive exposure: Past   Smokeless tobacco: Never  Vaping Use   Vaping Use: Never used  Substance and Sexual Activity   Alcohol use: Not Currently    Comment: ocassional   Drug use: No   Sexual activity: Not on file  Other Topics Concern   Not on file  Social History Narrative   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   Social Determinants of Health   Financial Resource Strain: Not on file  Food Insecurity: No Food Insecurity (05/05/2022)   Hunger Vital Sign    Worried About Running Out of Food in the Last Year: Never true    Ran Out of Food in the Last Year: Never true  Transportation Needs: No Transportation Needs (05/05/2022)   PRAPARE - Administrator, Civil Service (Medical): No    Lack of Transportation (Non-Medical): No  Physical Activity: Not on file  Stress: Not on file  Social Connections: Not on file  Intimate Partner  Violence: Not At Risk (05/05/2022)   Humiliation, Afraid, Rape, and Kick questionnaire    Fear of Current or Ex-Partner: No    Emotionally Abused: No    Physically Abused: No    Sexually Abused: No    FAMILY HISTORY:  Family History  Problem Relation Age of Onset   Hypertension Mother    Diabetes Mother    Osteoporosis Mother    Hypertension Father    Heart disease Father    COPD Father    Colon cancer Sister    Colon cancer Brother    Heart disease Brother    Heart disease Brother    Emphysema Paternal Grandfather     CURRENT MEDICATIONS:  Outpatient Encounter Medications as of 06/01/2022  Medication Sig   ACCU-CHEK AVIVA PLUS test strip    Accu-Chek Softclix Lancets lancets    albuterol (VENTOLIN HFA) 108 (90 Base) MCG/ACT inhaler Inhale 2 puffs into the lungs every  4 (four) hours as needed for wheezing or shortness of breath.   apixaban (ELIQUIS) 5 MG TABS tablet Take 1 tablet (5 mg total) by mouth 2 (two) times daily. Start in 1 month after completing initial Starter pack   Calcium Carbonate (CALTRATE 600 PO) Take 1 tablet by mouth 2 (two) times a day.   clonazePAM (KLONOPIN) 0.5 MG tablet Take 0.5 mg by mouth at bedtime.   cyanocobalamin (VITAMIN B12) 1000 MCG tablet Take 1,000 mcg by mouth daily.   ESBRIET 801 MG TABS Take 801 mg by mouth 3 (three) times daily with meals.   fenofibrate (TRICOR) 48 MG tablet Take 48 mg by mouth at bedtime.   FLOVENT HFA 110 MCG/ACT inhaler INHALE 1 PUFF INTO THE LUNGS 2 TIMES A DAY.   fluticasone (FLONASE) 50 MCG/ACT nasal spray Place 2 sprays into both nostrils daily.   levocetirizine (XYZAL) 5 MG tablet Take 5 mg by mouth daily.   losartan (COZAAR) 50 MG tablet Take 50 mg by mouth daily.   metFORMIN (GLUCOPHAGE-XR) 500 MG 24 hr tablet Take 500 mg by mouth daily with breakfast.    metoprolol succinate (TOPROL-XL) 100 MG 24 hr tablet Take 100 mg by mouth daily. Take with or immediately following a meal.   omeprazole (PRILOSEC) 20 MG  capsule TAKE 1 CAPSULE BY MOUTH ONCE DAILY. (Patient taking differently: Take 20 mg by mouth daily.)   REPATHA SURECLICK 140 MG/ML SOAJ Inject 1 mL into the skin every 14 (fourteen) days.   SAVELLA 50 MG TABS tablet Take 50 mg by mouth 2 (two) times daily.    spironolactone (ALDACTONE) 25 MG tablet Take 0.5 tablets (12.5 mg total) by mouth daily.   tiZANidine (ZANAFLEX) 4 MG tablet TAKE 1 TABLET BY MOUTH AT BEDTIME.   No facility-administered encounter medications on file as of 06/01/2022.    ALLERGIES:  Allergies  Allergen Reactions   Latex    Pseudoephedrine Other (See Comments)    Rash and hallucinations    Sulfa Antibiotics    Sulfonamide Derivatives Rash    LABORATORY DATA:  I have reviewed the labs as listed.  CBC    Component Value Date/Time   WBC 9.5 03/20/2022 0434   RBC 3.53 (L) 03/20/2022 0434   HGB 11.3 (L) 03/20/2022 0434   HCT 34.9 (L) 03/20/2022 0434   PLT 353 03/20/2022 0434   MCV 98.9 03/20/2022 0434   MCH 32.0 03/20/2022 0434   MCHC 32.4 03/20/2022 0434   RDW 13.6 03/20/2022 0434   LYMPHSABS 2,678 03/15/2017 1457   MONOABS 970 (H) 06/28/2016 1514   EOSABS 200 03/15/2017 1457   BASOSABS 147 03/15/2017 1457      Latest Ref Rng & Units 03/18/2022    6:05 AM 03/17/2022    3:10 PM 02/28/2022    3:07 PM  CMP  Glucose 70 - 99 mg/dL 409  811    BUN 8 - 23 mg/dL 12  11    Creatinine 9.14 - 1.00 mg/dL 7.82  9.56    Sodium 213 - 145 mmol/L 139  139    Potassium 3.5 - 5.1 mmol/L 3.5  3.4    Chloride 98 - 111 mmol/L 107  104    CO2 22 - 32 mmol/L 23  26    Calcium 8.9 - 10.3 mg/dL 9.5  9.6  9.8   Alkaline Phos 39 - 117 U/L   75     DIAGNOSTIC IMAGING:  I have independently reviewed the relevant imaging and discussed with the  patient.   WRAP UP:  All questions were answered. The patient knows to call the clinic with any problems, questions or concerns.  Medical decision making: Low  Time spent on visit: I spent 15 minutes counseling the patient face  to face. The total time spent in the appointment was 22 minutes and more than 50% was on counseling.  Carnella Guadalajara, PA-C  06/01/22 4:47 PM

## 2022-06-01 ENCOUNTER — Ambulatory Visit: Payer: Medicare Other | Admitting: Physician Assistant

## 2022-06-01 ENCOUNTER — Inpatient Hospital Stay: Payer: Medicare Other | Attending: Hematology | Admitting: Physician Assistant

## 2022-06-01 VITALS — BP 120/71 | HR 83 | Temp 98.2°F | Resp 18 | Ht 65.0 in | Wt 159.2 lb

## 2022-06-01 DIAGNOSIS — I2510 Atherosclerotic heart disease of native coronary artery without angina pectoris: Secondary | ICD-10-CM | POA: Insufficient documentation

## 2022-06-01 DIAGNOSIS — E782 Mixed hyperlipidemia: Secondary | ICD-10-CM | POA: Insufficient documentation

## 2022-06-01 DIAGNOSIS — Z90710 Acquired absence of both cervix and uterus: Secondary | ICD-10-CM | POA: Diagnosis not present

## 2022-06-01 DIAGNOSIS — Z882 Allergy status to sulfonamides status: Secondary | ICD-10-CM | POA: Insufficient documentation

## 2022-06-01 DIAGNOSIS — Z86711 Personal history of pulmonary embolism: Secondary | ICD-10-CM | POA: Diagnosis not present

## 2022-06-01 DIAGNOSIS — E119 Type 2 diabetes mellitus without complications: Secondary | ICD-10-CM | POA: Diagnosis not present

## 2022-06-01 DIAGNOSIS — I824Z1 Acute embolism and thrombosis of unspecified deep veins of right distal lower extremity: Secondary | ICD-10-CM | POA: Diagnosis not present

## 2022-06-01 DIAGNOSIS — Z833 Family history of diabetes mellitus: Secondary | ICD-10-CM | POA: Diagnosis not present

## 2022-06-01 DIAGNOSIS — G479 Sleep disorder, unspecified: Secondary | ICD-10-CM | POA: Diagnosis not present

## 2022-06-01 DIAGNOSIS — I10 Essential (primary) hypertension: Secondary | ICD-10-CM | POA: Insufficient documentation

## 2022-06-01 DIAGNOSIS — Z7901 Long term (current) use of anticoagulants: Secondary | ICD-10-CM | POA: Insufficient documentation

## 2022-06-01 DIAGNOSIS — Z8262 Family history of osteoporosis: Secondary | ICD-10-CM | POA: Insufficient documentation

## 2022-06-01 DIAGNOSIS — Z800 Family history of malignant neoplasm of digestive organs: Secondary | ICD-10-CM | POA: Insufficient documentation

## 2022-06-01 DIAGNOSIS — Z825 Family history of asthma and other chronic lower respiratory diseases: Secondary | ICD-10-CM | POA: Diagnosis not present

## 2022-06-01 DIAGNOSIS — I2699 Other pulmonary embolism without acute cor pulmonale: Secondary | ICD-10-CM | POA: Diagnosis not present

## 2022-06-01 DIAGNOSIS — Z8249 Family history of ischemic heart disease and other diseases of the circulatory system: Secondary | ICD-10-CM | POA: Diagnosis not present

## 2022-06-01 DIAGNOSIS — J84112 Idiopathic pulmonary fibrosis: Secondary | ICD-10-CM | POA: Insufficient documentation

## 2022-06-01 DIAGNOSIS — Z79899 Other long term (current) drug therapy: Secondary | ICD-10-CM | POA: Diagnosis not present

## 2022-06-01 DIAGNOSIS — R5383 Other fatigue: Secondary | ICD-10-CM | POA: Insufficient documentation

## 2022-06-01 DIAGNOSIS — I82461 Acute embolism and thrombosis of right calf muscular vein: Secondary | ICD-10-CM | POA: Insufficient documentation

## 2022-06-01 NOTE — Patient Instructions (Signed)
[facility] **   You were seen today by Tarri Abernethy PA-C for your follow-up visit.    BLOOD CLOTS The blood clots in your legs and lungs were provoked in the setting of your COVID-19 infection and your humerus fracture. We will check ultrasound and CT scan in June to see if your blood clots have resolved.  Until that time, continue to take Eliquis twice daily.  FOLLOW-UP APPOINTMENT: Follow-up visit in June 2024, after CT scan and ultrasound  ** Thank you for trusting me with your healthcare!  I strive to provide all of my patients with quality care at each visit.  If you receive a survey for this visit, I would be so grateful to you for taking the time to provide feedback.  Thank you in advance!  ~ Ledora Delker                   Dr. Derek Jack   &   Tarri Abernethy, PA-C   - - - - - - - - - - - - - - - - - -    Thank you for choosing [facility] to provide your oncology and hematology care.  To afford each patient quality time with our provider, please arrive at least 15 minutes before your scheduled appointment time.   If you have a lab appointment with the [facility] please come in thru the Main Entrance and check in at the main information desk.  You need to re-schedule your appointment should you arrive 10 or more minutes late.  We strive to give you quality time with our providers, and arriving late affects you and other patients whose appointments are after yours.  Also, if you no show three or more times for appointments you may be dismissed from the clinic at the providers discretion.     Again, thank you for choosing [facility].  Our hope is that these requests will decrease the amount of time that you wait before being seen by our physicians.       _____________________________________________________________  Should you have questions  after your visit to [facility], please contact our office at [phone] and follow the prompts.  Our office hours are 8:00 a.m. and 4:30 p.m. Monday - Friday.  Please note that voicemails left after 4:00 p.m. may not be returned until the following business day.  We are closed weekends and major holidays.  You do have access to a nurse 24-7, just call the main number to the clinic [phone] and do not press any options, hold on the line and a nurse will answer the phone.    For prescription refill requests, have your pharmacy contact our office and allow 72 hours.

## 2022-06-05 ENCOUNTER — Ambulatory Visit: Payer: Medicare Other | Admitting: Internal Medicine

## 2022-06-05 ENCOUNTER — Other Ambulatory Visit: Payer: Self-pay

## 2022-06-05 ENCOUNTER — Encounter: Payer: Self-pay | Admitting: Internal Medicine

## 2022-06-05 ENCOUNTER — Ambulatory Visit (INDEPENDENT_AMBULATORY_CARE_PROVIDER_SITE_OTHER): Payer: Medicare Other | Admitting: Internal Medicine

## 2022-06-05 VITALS — BP 124/68 | HR 80 | Ht 65.0 in | Wt 160.0 lb

## 2022-06-05 DIAGNOSIS — Z5181 Encounter for therapeutic drug level monitoring: Secondary | ICD-10-CM | POA: Diagnosis not present

## 2022-06-05 DIAGNOSIS — J84112 Idiopathic pulmonary fibrosis: Secondary | ICD-10-CM

## 2022-06-05 DIAGNOSIS — I2699 Other pulmonary embolism without acute cor pulmonale: Secondary | ICD-10-CM

## 2022-06-05 DIAGNOSIS — I824Z1 Acute embolism and thrombosis of unspecified deep veins of right distal lower extremity: Secondary | ICD-10-CM

## 2022-06-05 LAB — HEPATIC FUNCTION PANEL
ALT: 15 U/L (ref 0–35)
AST: 19 U/L (ref 0–37)
Albumin: 4.1 g/dL (ref 3.5–5.2)
Alkaline Phosphatase: 89 U/L (ref 39–117)
Bilirubin, Direct: 0 mg/dL (ref 0.0–0.3)
Total Bilirubin: 0.2 mg/dL (ref 0.2–1.2)
Total Protein: 7.4 g/dL (ref 6.0–8.3)

## 2022-06-05 LAB — CBC WITH DIFFERENTIAL/PLATELET
Basophils Absolute: 0.1 10*3/uL (ref 0.0–0.1)
Basophils Relative: 1.1 % (ref 0.0–3.0)
Eosinophils Absolute: 0.1 10*3/uL (ref 0.0–0.7)
Eosinophils Relative: 1.4 % (ref 0.0–5.0)
HCT: 41.3 % (ref 36.0–46.0)
Hemoglobin: 13.6 g/dL (ref 12.0–15.0)
Lymphocytes Relative: 23.6 % (ref 12.0–46.0)
Lymphs Abs: 2.5 10*3/uL (ref 0.7–4.0)
MCHC: 33 g/dL (ref 30.0–36.0)
MCV: 94.8 fl (ref 78.0–100.0)
Monocytes Absolute: 1 10*3/uL (ref 0.1–1.0)
Monocytes Relative: 9.6 % (ref 3.0–12.0)
Neutro Abs: 6.7 10*3/uL (ref 1.4–7.7)
Neutrophils Relative %: 64.3 % (ref 43.0–77.0)
Platelets: 455 10*3/uL — ABNORMAL HIGH (ref 150.0–400.0)
RBC: 4.36 Mil/uL (ref 3.87–5.11)
RDW: 13.8 % (ref 11.5–15.5)
WBC: 10.5 10*3/uL (ref 4.0–10.5)

## 2022-06-05 LAB — PULMONARY FUNCTION TEST
DL/VA % pred: 94 %
DL/VA: 3.9 ml/min/mmHg/L
DLCO cor % pred: 55 %
DLCO cor: 11.14 ml/min/mmHg
DLCO unc % pred: 54 %
DLCO unc: 11.1 ml/min/mmHg
FEF 25-75 Pre: 1.38 L/sec
FEF2575-%Pred-Pre: 68 %
FEV1-%Pred-Pre: 68 %
FEV1-Pre: 1.64 L
FEV1FVC-%Pred-Pre: 103 %
FEV6-%Pred-Pre: 68 %
FEV6-Pre: 2.08 L
FEV6FVC-%Pred-Pre: 104 %
FVC-%Pred-Pre: 66 %
FVC-Pre: 2.09 L
Pre FEV1/FVC ratio: 78 %
Pre FEV6/FVC Ratio: 100 %

## 2022-06-05 NOTE — Patient Instructions (Addendum)
IPF (idiopathic pulmonary fibrosis) (HCC) Medication monitoring encounter Gastroesophageal reflux disease, unspecified whether esophagitis present Dyspnea on exertion   -Normal echocardiogram  Jan 2024 and normal BNP dec 2023 make pulmonary hypertension unlikely - According to radiology - disease is stable but according to breathing test - disesase is slowly progressive   Plan -Continue acid reflux medications -Continue pirfenidone per schedule   -At this point in time benefit outweighs risk  -Continue space 5-6h between meals and apply sunscreen when going out -We discussed clinical trials as a care option and appreciate your interest  -meet LeighAnn of [facility] to enroll in bone  scan DEXA study if you qualif - Check LFT, 06/05/2022 and then LFT every 3 months - Do spiro/dlco in 3-4 months  Drug-induced weight loss  - continued weight loss slowly with pirfenidone ; 19# in 3 yueras - weight 160# and BMI 26.63  -At this point in time is still are technically overweight but you are getting closer to normal weight -Therefore we will leverage pirfenidone and helping you with your weight loss -We will continue to monitor -Do talk to primary care physician about other reasons for weight loss   Pulmonary Embolism with Rt lower extremity DVT Dec 2023  - ? Due to covid as risk factor and shoulder injury  Plan  - continue eliquis as before;' probably reassess in summer 2024 lenth of treatment - check cbc 06/05/2022 - check d-dimer at f[facility]owup     Followup -Return to see  DR Chase Caller in 3-4 months - 30 min visit but after spiro/dlco

## 2022-06-05 NOTE — Progress Notes (Signed)
Spirometery and DLCO completed today

## 2022-06-05 NOTE — Progress Notes (Signed)
OV 11/19/2018: Nichole Reyes is a 72 year old woman referred for evaluation of dyspnea for the past several months.  In the same time interval she is also noticed fatigue.  She has previously undergone a negative cardiac evaluation and an outside CT scan of her chest.  Per report, the CT demonstrates fibrotic changes. She is a never- smoker.  She has previously been evaluated by rheumatology, Dr. Estanislado Pandy, for fibromyalgia and osteoarthritis.  She began to notice dyspnea on exertion when exercising in February 2020.  She had previously been able to walk 35 to 40 minutes, but decreased to about 20 minutes at a time due to dyspnea.  She was hospitalized in [place] in June 2020 for dyspnea. At that time she was COVID negative.  She was discharged on albuterol, which she has been using up to 4 times a day, which improves her symptoms.  There is no family history of fibrotic lung disease; multiple family members had COPD.  Her maternal grandmother had rheumatoid arthritis, but otherwise there is no family history of rheumatologic disease.  In addition to dyspnea on exertion with occasional dyspnea at rest she endorses cough and sputum production.  She denies wheezing, postnasal drip, coughing or choking when eating, Raynaud's, muscle weakness, rashes, dry eyes or mouth, joint swelling, prolonged morning stiffness.  She has occasional hand stiffness throughout the day, not associated with rest.  She has a hiatal hernia with occasional GERD with symptoms less than once a week, associated with certain foods.  She is a never smoker or vaper.  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  She has never worked in [employer] or shipyards.  She does not have any dusty hobbies.  She has a Programmer, systems, but has never had pet birds.  She does not regularly use hot tubs.  She has never had exposure to medical radiation, methotrexate,  Macrobid, amiodarone, bleomycin.  Her only new medication was Zetia last month for hypertriglyceridemia.  Otherwise she has not had medication changes in greater than a year.  Last month she has had 6 episodes of epistaxis, which has improved with use of intranasal steroids prescribed by her PCP.     OV 05/06/2019  Subjective:  Patient ID: Nichole Reyes, female , DOB: Sep 07, 1950 , age 11 y.o. , MRN: [id number] , ADDRESS: [place] [address]   05/06/2019 -   Chief Complaint  Patient presents with   Follow-up    Pt states she has both good days and bad days with breathing. Pt states she will occ have to use her rescue inhaler at least 1-2 times with activities.     HPI Nichole Reyes 72 y.o. -referred by Dr. 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  She was first seen by Dr. Carlis Abbott in September 2020 noted above.  She followed up in December 2020 and had pulmonary function test.  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  Due to insurance delays she has not started the nintedanib yet.  She is wondering about this.  She has specific questions about taking Covid vaccine.  She is also worried about her significant cough.  Review shows she is on ACE inhibitor.  She has a tickle in the throat.  She has acid reflux and is on Prilosec.  She has a history of  hiatal hernia.  She is taken a decision on starting nintedanib but she is interested in hearing about the other antifibrotic pirfenidone because of insurance delay she has not started on anything as yet.   [facility] Comprehensive ILD Questionnaire  Symptoms:  -Insidious onset of shortness of breath 7 months ago.  Since it started it has been the same.  She also has episodic amount of dyspnea.  The severity classification is below.  She does not know when her cough started but she does have a cough associated with clearing of the throat.  It is  moderate in intensity.  She does cough at night.  She does bring up phlegm.  The phlegm is usually white but sometimes yellow.  She does feel a tickle in her throat but does not wake up in the middle of the night because of the cough.  There is no wheezing.  Cough is associated with ACE inhibitor intake.    Past Medical History : Positive for fibromyalgia, hiatal hernia, sleep apnea both for the last few years.  There is also borderline diabetes..  And hyperlipidemia   she denies any asthma or COPD or heart failure rheumatoid arthritis or scleroderma or lupus.  Denies any polymyositis.  Denies Sjogren's.  Denies HIV.  Denies pulmonary hypertension.  Denies thyroid disease or stroke.  Denies seizures or mononucleosis or hepatitis or tuberculosis.  Denies kidney disease.  Denies pneumonia.  Denies history of blood clots or heart disease or pleurisy.  Autoimmune serology sept 2020  - normal Results for [name], [name] (MRN [id number]) as of 05/06/2019 12:01  Ref. Range 11/19/2018 14:58  SEE BELOW Unknown Comment  Anti Nuclear Antibody (ANA) Latest Ref Range: Negative  Negative  Anti JO-1 Latest Ref Range: 0.0 - 0.9 AI <0.2  CENTROMERE AB SCREEN Latest Ref Range: 0.0 - 0.9 AI <0.2  dsDNA Ab Latest Ref Range: 0 - 9 IU/mL <1  ENA RNP Ab Latest Ref Range: 0.0 - 0.9 AI <0.2  ENA SSA (RO) Ab Latest Ref Range: 0.0 - 0.9 AI <0.2  ENA SSB (LA) Ab Latest Ref Range: 0.0 - 0.9 AI <0.2  ENA SM Ab Ser-aCnc Latest Ref Range: 0.0 - 0.9 AI <0.2  Chromatin Ab SerPl-aCnc Latest Ref Range: 0.0 - 0.9 AI <0.2  Anti-Jo-1 Ab (RDL) Latest Ref Range: <20 Units <20  Anti-PL-7 Ab (RDL) Unknown CANCELED  Anti-PL-12 Ab (RDL) Unknown CANCELED  Anti-EJ Ab (RDL) Unknown CANCELED  Anti-OJ Ab (RDL) Unknown CANCELED  Anti-SRP Ab (RDL) Unknown CANCELED  Anti-Mi-2 Ab (RDL) Unknown CANCELED  Anti-TIF-1gamma Ab (RDL) Latest Units: Units CANCELED  Anti-MDA-5 Ab (CADM-140)(RDL) Latest Units: Units CANCELED  Anti-NXP-2 (P140) Ab  (RDL) Latest Units: Units CANCELED  Anti-SAE1 Ab, IgG (RDL) Latest Units: Units CANCELED  Anti-PM/Scl-100 Ab (RDL) Latest Ref Range: <20 Units <20  Anti-Ku Ab (RDL) Unknown CANCELED  Anti-SS-A 52kD Ab, IgG (RDL) Latest Ref Range: <20 Units <20  Anti-U1 RNP Ab (RDL) Latest Units: Units CANCELED    ROS: She has fatigue for the last several years and also arthralgia.  She attributes this to fibromyalgia.  She also has pain in her fingers which she attributes due to fibromyalgia.  She has heartburn from hiatal hernia she says.  She also snores because of her sleep apnea and she has a rash related to eczema.  But otherwise negative.   FAMILY HISTORY of LUNG DISEASE: She thinks her dad might have had COPD and her grandfather.  However nobody with pulmonary fibrosis or  other lung diseases.   EXPOSURE HISTORY: Denies ever having smoked cigarettes although she grew up in a home with her dad smoked.  Therefore positive for passive smoking as a child.  Denies cigar use.  Denies marijuana use denies cocaine use denies IV drug use.  Denies vaping.   HOME and HOBBY DETAILS : [place]  She is lived in this home for 22 years.  The home is 70 years old.  She has noticed mildew periodically in the shower curtain and in the bathroom.  She does use a CPAP mask but there is no water circuit mold or mildew in it.  She does do gardening with the flower garden she does use a steam iron but there is no mold or mildew in it.  There is no pet birds.  No feather pillow or duvet.  No pet gerbils.  No mold in the [facility] duct.  Does not play any wind instruments.  Does not use a humidifier.  Does not have a Jacuzzi in the house.   OCCUPATIONAL HISTORY (122 questions) : Essentially negative.  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   PULMONARY TOXICITY HISTORY (27 items): Negative except prednisone use    Results for MELENIE, MINNIEAR (MRN [id number]) as of 05/06/2019 12:01  Ref. Range 11/19/2018 but ? Done 02/28/2019 14:57  FVC-Pre Latest Units: L 2.29  FVC-%Pred-Pre Latest Units: % 70   Results for CAILEN, TEXEIRA (MRN [id number]) as of 05/06/2019 12:01  Ref. Range 11/19/2018 but ? Done 02/28/2019 14:57  DLCO unc Latest Units: ml/min/mmHg 15.75  DLCO unc % pred Latest Units: % 77    She had a high-resolution CT chest September 24, 2018 at [facility].  This was read by our radiologist Dr Eddie Candle..  I do not have the image to visualized but I trust this report.  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  In addition is described a 1.4 cm right upper lobe posterior segment inferior part nodule that is subpleural and also 8 mm subpleural right middle lobe nodule.  He says these findings are unchanged compared to 2008 and are benign.  OV 06/03/2019 -telephone visit.  Patient identified with 2 person identifier.  Risks, benefits and limitations of telephone visit explained.  Subjective:  Patient ID: Nichole Reyes, female , DOB: November 07, 1950 , age 60 y.o. , MRN: [id number] , ADDRESS: [address] [place] [address]   Clinical IPF diagnosis given dec 2020 and reiterated Mid Feb 2021:  based on your age greater than 65, clubbing on exam, CT scan with high probability for this condition, negative history on your questionnaire and negative blood test on your serology exams   06/03/2019 -  Fu IPF - focus on esbriet monitoring and any other questions she might have   HPI Nichole Reyes 72 y.o. - on Esbriet now. Started it 05/22/2019 Thursday. Currently on 2 pills tid. Taking it with food. Spacing 5-6h between dosing. No side effects at this point.  From a dyspnea standpoint she is stable.  She had questions about being able to do gardening.  We discussed the fact this could cause mold  exposure and organic dust exposure that could irritate her IPF.  She says she can do it with masking.  Gardening gives her purpose so I supported it.  She also plan [place] last week and was quite short of breath and took an inhaler.  I have suggested that she use a pulse ox meter and monitor her pulse ox and call us if her pulse ox below 88% so we can order portable oxygen [obviously will have to do stress testing submaximal 6-minute walk test] otherwise doing well.    OV 12/16/2019   Subjective:  Patient ID: Nichole Reyes, female , DOB: 1950-08-26, age 72 y.o. years. , MRN: [id number],  ADDRESS: [place] [address] PCP  Rory Percy, MD Providers : Treatment Team:  Attending Provider: Brand Males, MD   Chief Complaint  Patient presents with   Follow-up    Tired all the time. redness/itching to bilateral nick and insides of  arms.  stress/heat?     Clinical IPF diagnosis given dec 2020 and reiterated Mid Feb 2021:  based on your age greater than 65, clubbing on exam, CT scan with high probability for this condition, negative history on your questionnaire and negative blood test on your serology exams . - on Esbriet now. Started it 05/22/2019   GERD/Hiatal Hernia  HPI IKESHA SILLER 72 y.o. -returns for follow-up.  Last seen in early part of 2021.  After that she see nurse practitioner 2 times once or at least telephone visit.  She tried it in pulmonary rehabilitation for IPF but she had logistical issues.  Early morning she has to get her husband ready for work.  Late in the afternoon she has to pick up her granddaughter.  She also has knee issues from a fibromyalgia.  Therefore is not able to do pulmonary rehabilitation.  She is very compliant with her pirfenidone.  She feels she is tolerating it fine but she did admit to low appetite and also has a 7 pound weight loss.  She is also fatigued.  She thinks the fatigue is present for the last few months.  She  thinks it might be related to her daughter's wedding but then the fatigue has been there for the last few months while the daughter's wedding happened a few weeks ago.  She is not on oxygen.  Walking desaturation test is stable.  Most recent pulmonary function test is stable.  Symptom score other than fatigue is also stable.  She continues PPI for her acid reflux.      OV 05/06/2020  Subjective:  Patient ID: Nichole Reyes, female , DOB: Mar 15, 1951 , age 72 y.o. , MRN: [id number] , ADDRESS: [place] [address] PCP Rory Percy, MD Patient Care Team: Rory Percy, MD as PCP - General (Family Medicine) Herminio Commons, MD (Inactive) as PCP - Cardiology (Cardiology)  This Provider for this visit: Treatment Team:  Attending Provider: Brand Males, MD    05/06/2020 -   Chief Complaint  Patient presents with   Follow-up    SOB with exertion    Clinical IPF diagnosis given dec 2020 and reiterated Mid Feb 2021:  based on your age greater than 65, clubbing on exam, CT scan with high probability for this condition, negative history on your questionnaire and negative blood test on your serology exams . - on Esbriet now. Started it 05/22/2019   GERD/Hiatal Hernia  Last CT scan of the chest July 2020 at [facility] with probable UIP Last PFT June 2021  HPI JHOSELIN CRUME 72 y.o. -presents for follow-up.  Last seen in  September 2021.  Since then she continues to deal with her right knee issue.  She is seen Dr. Bo Merino for this.  Apparently a knee MRI is pending.  She is continuing to lose weight.  She has lost 10 pounds in the last 1 year.  This correlates with starting pirfenidone.  Technically she is still overweight.  She is also reporting associated fatigue and metallic taste.  A few months ago she made a call and we told her to hold the pirfenidone.  After that the metallic taste largely resolved but it does come back although to a much lesser severity.   At this point in time it does not bother her too much so she think she will continue to take pirfenidone.  From a respiratory standpoint she feels stable although she would say that in the last 1 year she feels the disease might be slowly progressive.  Her symptom score shows slight worsening especially with shortness of breath and weight loss but same fatigue.  Some nausea with pirfenidone.  He was supposed to pulmonary function testing today but for some reason has not happened.  Last PFT was June 2021.  Last CT scan of the chest was July 2020 I do not have echocardiogram in our system.  She did have a low risk nuclear medicine stress test with 59% in June 2020   [place] 07/13/2020  Subjective:  Patient ID: Nichole Reyes, female , DOB: Sep 25, 1950 , age 30 y.o. , MRN: [id number] , ADDRESS: [place] [address] PCP Lanelle Bal, PA-C Patient Care Team: Lanelle Bal, PA-C as PCP - General (Family Medicine) Herminio Commons, MD (Inactive) as PCP - Cardiology (Cardiology)  This Provider for this visit: Treatment Team:  Attending Provider: Brand Males, MD    07/13/2020 -   Chief Complaint  Patient presents with   Follow-up    No concerns, here to get PFT results.    Clinical IPF diagnosis given dec 2020 and reiterated Mid Feb 2021:  based on your age greater than 65, clubbing on exam, CT scan with high probability for this condition, negative history on your questionnaire and negative blood test on serology exams . - Oon Esbriet since 05/22/2019   GERD/Hiatal Hernia  Last PFT April 2022 Last CT scan of the chest March 2022   HPI KADI HESSION 72 y.o. -returns for follow-up to discuss her test results.  She definitely has more shortness of breath now than she did in September 2021.  She continues to lose weight.  But her BMI is still 27.  No metallic taste no nausea vomiting or diarrhea.  No chest pains.  Her pulmonary function test shows slight decline in DLCO but  FVC is stable.  Walking desaturation to stable.  High-resolution CT chest shows stability in ILD compared to 2020.  Therefore overall she is probably stable.  There is evidence of coronary artery calcification and also aortic valve calcifications on the CT.  Her echo showed mild diastolic dysfunction that could be contributing to the dyspnea.  There is also mild aortic valve sclerosis.  There is no chest pain with exertion.  Her last cardiologist in [place] is no longer in the health system.  She is not seen a cardiologist in 7 years.  Does not recollect when her last stress test was.   WE discussed   1. Scientific Purpose  Clinical research is designed to produce generalizable knowledge and to answer questions about the safety and efficacy  of intervention(s) under study in order to determine whether or not they may be useful for the care of future patients.  2. Study Procedures  Participation in a trial may involve procedures or tests, in addition to the intervention(s) under study, that are intended only or primarily to generate scientific knowledge and that are otherwise not necessary for patient care.   3. Uncertainty  For intervention(s) under study in clinical research, there often is less knowledge and more uncertainty about the risks and benefits to a population of trial participants than there is when a doctor offers a patient standard interventions.   4. Adherence to Protocol  Administration of the intervention(s) under study is typically based on a strict protocol with defined dose, scheduling, and use or avoidance of concurrent medications, compared to administration of standard interventions.  5. Clinician as Investigator  Clinicians who are in health care settings provide treatment; in a clinical trial setting, they are also investigating safety and efficacy of an intervention. In otherwise your doctor or nurse practitioner can be wearing 2 hats - one as care giver another as  Company secretary  6. Patient as Visual merchandiser Subject  Patients participating in research trials are research subjects or volunteers. In other words participating in research is 100% voluntary and at one's own free weill. The decision to participate or not participate will NOT affect patient care and the doctor-patient relationship in any way   ECHO 06/29/20  IMPRESSIONS     1. Left ventricular ejection fraction, by estimation, is 60 to 65%. The  left ventricle has normal function. The left ventricle has no regional  wall motion abnormalities. There is mild left ventricular hypertrophy.  Left ventricular diastolic parameters  are consistent with Grade I diastolic dysfunction (impaired relaxation).   2. Right ventricular systolic function is normal. The right ventricular  size is normal. There is normal pulmonary artery systolic pressure. The  estimated right ventricular systolic pressure is 41.9 mmHg.   3. The mitral valve is grossly normal. Trivial mitral valve  regurgitation.   4. The aortic valve is tricuspid. Aortic valve regurgitation is not  visualized. Mild aortic valve sclerosis is present, with no evidence of  aortic valve stenosis.   5. The inferior vena cava is normal in size with greater than 50%  respiratory variability, suggesting right atrial pressure of 3 mmHg.   Comparison(s): No prior Echocardiogram.    CT Chest data march 22022  Narrative & Impression  CLINICAL DATA:  72 year old female with history of chronic dyspnea. Fibrotic changes noted on prior CT examination. Follow-up study.   EXAM: CT CHEST WITHOUT CONTRAST   TECHNIQUE: Multidetector CT imaging of the chest was performed following the standard protocol without intravenous contrast. High resolution imaging of the lungs, as well as inspiratory and expiratory imaging, was performed.   COMPARISON:  No priors available for comparison.   FINDINGS: Cardiovascular: Heart size is normal.  There is no significant pericardial fluid, thickening or pericardial calcification. There is aortic atherosclerosis, as well as atherosclerosis of the great vessels of the mediastinum and the coronary arteries, including calcified atherosclerotic plaque in the left main, left anterior descending, left circumflex and right coronary arteries. Calcifications of the aortic valve.   Mediastinum/Nodes: No pathologically enlarged mediastinal or hilar lymph nodes. Please note that accurate exclusion of hilar adenopathy is limited on noncontrast CT scans. Esophagus is unremarkable in appearance. No axillary lymphadenopathy.   Lungs/Pleura: High-resolution images demonstrate widespread areas of peripheral predominant ground-glass attenuation, septal thickening,  mild cylindrical bronchiectasis and peripheral bronchiolectasis. Findings have a mild craniocaudal gradient. No definitive honeycombing is identified. Inspiratory and expiratory imaging is unremarkable. No acute consolidative airspace disease. No pleural effusions. In the periphery of the right middle lobe (axial image 89 of series 6) there is a 7 x 5 mm (mean diameter of 6 mm) subpleural pulmonary nodule (stable compared to prior study 09/24/2018). In the superior segment of the right lower lobe (axial image 73 of series 6) there is a 2.3 x 0.8 cm elongated nodular area of architectural distortion which is intimately associated with an accessory fissure, which appears slightly larger on axial images, but is completely stable in size and appearance when viewed on sagittal reconstructions.   Upper Abdomen: Aortic atherosclerosis.   Musculoskeletal: There are no aggressive appearing lytic or blastic lesions noted in the visualized portions of the skeleton.   IMPRESSION: 1. 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 Repeat high-resolution chest CT is recommended in 12 months to assess for temporal changes in the appearance of the lung parenchyma. 2. Pulmonary nodules in the right lung are stable compared to the prior study from 2020, likely benign. Attention at time of repeat high-resolution chest CT is recommended to ensure continued stability. 3. Aortic atherosclerosis, in addition to left main and 3 vessel coronary artery disease. Please note that although the presence of coronary artery calcium documents the presence of coronary artery disease, the severity of this disease and any potential stenosis cannot be assessed on this non-gated CT examination. Assessment for potential risk factor modification, dietary therapy or pharmacologic therapy may be warranted, if clinically indicated. 4. There are calcifications of the aortic valve. Echocardiographic correlation for evaluation of potential valvular dysfunction may be warranted if clinically indicated.   Aortic Atherosclerosis (ICD10-I70.0).   Electronically Signed: By: Vinnie Langton M.D. On: 06/01/2020 09:51  ADDENDUM REPORT: 06/21/2020 14:33   ADDENDUM: It was brought to my attention that there is a prior chest CT from 09/24/2018. After comparison between the 05/31/2020 exam and the 09/24/2018 exam, there is no evidence of progression. Assessment remains unchanged. Specifically, based on the spectrum of findings this continues to be categorized as probable usual interstitial pneumonia (UIP) per current ATS guidelines.     Electronically Signed   By: Vinnie Langton M.D.   On: 06/21/2020 14:33        OV 01/24/2021  Subjective:  Patient ID: Nichole Reyes, female , DOB: [birth year] , age 23 y.o. , MRN: [id number] , ADDRESS: [address] [place] [address] PCP Lanelle Bal, PA-C Patient Care Team: Lanelle Bal, PA-C as PCP - General (Family Medicine) Satira Sark, MD as PCP - Cardiology  (Cardiology)  This Provider for this visit: Treatment Team:  Attending Provider: Brand Males, MD    01/24/2021 -   Chief Complaint  Patient presents with   Follow-up    F/U after PFT. States her breathing has been stable since last visit with SOB with exertion.        HPI LAILANIE HASLEY 72 y.o. -returns for follow-up.  In this visit she has had pulmonary function test.  It shows a 7% decline in FVC and DLCO in 2 years.  Is roughly 3.5 %/year.  She is actually having more shortness of breath.  She did see cardiology and had a normal Myoview stress test.  She has been reassured  from a cardiology standpoint.  But has dyspnea score is worse.  She is unable to do pulmonary rehabilitation because of issues with family and also because she has osteoarthritis of her knees.  She is seeing rheumatology for this.  She says she has never seen orthopedic for this.  Did indicate to her to talk to rheumatology and see if at this time to do a knee replacement and if she needs an orthopedic referral.  Did indicate that at some point the ILD could get worse and she would be quite dyspneic and unable to mobilize.  But at this point from a safety standpoint would be a good point in time to do a knee replacement if the knees are really bad.  She understood this.  We did discuss worsening.  She is on maximal standard of care.  We did discuss clinical trials as a care option she is interested.  She has been losing weight with pirfenidone there is more weight loss.  Her BMI is 27 and technically she is still overweight.  We decided to continue with pirfenidone.  She will have a liver function test today.  She will have a high-dose flu shot today.     OV 08/18/2021  Subjective:  Patient ID: Nichole Reyes, female , DOB: [birth year] , age 40 y.o. , MRN: [id number] , ADDRESS: [address] [place] [address] PCP Lanelle Bal, PA-C Patient Care Team: Lanelle Bal, PA-C as PCP - General (Family  Medicine) Satira Sark, MD as PCP - Cardiology (Cardiology)  This Provider for this visit: Treatment Team:  Attending Provider: Brand Males, MD    08/18/2021 -   Chief Complaint  Patient presents with   Follow-up    PFT performed today.  Pt states she has been doing okay since last visit. States she gets nauseous every time she takes her Esbriet and also will become SOB with exertion.    Hyperlipidemia  HPI ANDRIANNA MANALANG 72 y.o. -returns for follow-up.  At this visit she continues to be stable in terms of previous weight loss but also dyspnea on exertion although this time she tells me that the dyspnea is bothering her though the symptom score is stable.  Her pulmonary function test shows overall slow decline over a long period of time but most recently the 2 studies are stable.  She appears to get dyspneic while walking the dog.  She is looking for measures to help her.  Did indicate to her about deconditioning.  She has previously been to pulmonary rehabilitation.  Her schedule a does not allow her to go to rehab.  We talked about doing a 6-minute walk test to look for exertional desaturation and seeing if she would qualify for oxygen.  She is willing to do this.    The more important thing is that she is having significant nausea.  In fact the nausea is worse now.  All of a sudden for the last few months.  She started taking over-the-counter Emetrol a few days ago and this seems to help.  She told me that she is spacing her pirfenidone only every 4 hours.  This is what she thought she needed to do.  I did indicate to her that she needs to space it out at least 5 to 6 hours apart.  We will see if this helps the nausea.  She does drink enough fluids.  We went over her medication list.  This been significant changes to her anticholesterol medications.  Since earlier this year she has been on Repatha.  Then a few days ago or a few weeks ago her Zetia got changed back to fenofibrate.   The timing of this medication does not fit in with nausea.  Nevertheless she says she has hyperlipidemia for over 30 years.  Managed with primary care.  She says it is very difficult to control.  Never been seen by lipid specialist.  Willing to see Dr. Kenneth Mali Hilty.   Of note she has been interested in clinical trials but given the current nausea she wants to hold off at this point  CT Chest data   OV 02/28/2022  Subjective:  Patient ID: Nichole Reyes, female , DOB: 05/05/1950 , age 68 y.o. , MRN: [id number] , ADDRESS: [address] [place] [address] PCP Lanelle Bal, PA-C Patient Care Team: Lanelle Bal, PA-C as PCP - General (Family Medicine) Satira Sark, MD as PCP - Cardiology (Cardiology)  This Provider for this visit: Treatment Team:  Attending Provider: Brand Males, MD    02/28/2022 -   Chief Complaint  Patient presents with   Follow-up    PFT performed today. Pt states she has been doing okay since last visit.     HPI JENNE HAIST 72 y.o. -returns for follow-up.  Since last seeing me in summer 2023 she is stable.  She tells me that Thanksgiving 2023 she did have outpatient COVID-19.  Treated with Paxlovid.  4 days into this she was extremely tired and then suddenly passed out.  Since then she has been feeling well.  She felt it was because of dehydration and polypharmacy.  However from a respiratory standpoint and pirfenidone tolerance standpoint she is doing really well.  No changes in symptoms.  Pulmonary function test is actually stable since the last 1 year [results below].  She had a normal echocardiogram in the summer 2023.  Results reviewed.  Her acid reflux under control.  In the past we have discussed clinical trials as a care option.  Today discussed the potential for osteoporosis risk with pulmonary fibrosis patients.  This study going on by [employer].  It involves getting a DEXA bone scan and she is willing to participate if she  qualifies.         OV 06/05/2022  Subjective:  Patient ID: Nichole Reyes, female , DOB: [birth year] , age 41 y.o. , MRN: [id number] , ADDRESS: [address] [place] [address] PCP Lanelle Bal, PA-C Patient Care Team: Lanelle Bal, PA-C as PCP - General (Family Medicine) Satira Sark, MD as PCP - Cardiology (Cardiology) Derek Jack, MD as Medical Oncologist (Hematology)  This Provider for this visit: Treatment Team:  Attending Provider: Brand Males, MD    Clinical IPF diagnosis given dec 2020 and reiterated Mid Feb 2021:  based on your age greater than 65, clubbing on exam, CT scan with high probability for this condition, negative history on your questionnaire and negative blood test on serology exams .  - Oon Esbriet since 05/22/2019 -    GERD/Hiatal Hernia  Last PFT  March 2024 Last CT scan of the chest March 2024  DJD/OA knee  Drug induced weihgt loss -due to pirfenidone  Grade 1 diastolic dysfunction with normal pulmonary artery pressure   History of COVID-19 Thanksgiving 2023 treated with outpatient Paxlovid  -Fatigue related syncope along with polypharmacy   PE 03/17/22 - 03/20/22 admission with Rt DVT - Rx Eliquis RV/LV ratio 1.47, BMP{ 60  - ECHO  Apr 13, 2022 - No RV strain and normal PAP. Gr 1 ddx  06/05/2022 -   Chief Complaint  Patient presents with   Follow-up    Pt is here for follow up for IPF. Pt states she is doing well and still taking Esbriet. No side effects noted so far while on medication      HPI HARSHITA BOOTHMAN 72 y.o. -returns for follow-up.  Presents with her husband.  I am meeting the husband for the first time.  He did get dependent history forming the fact that she does get short of breath when she does things around the house and does some minimal yard work.  He does think it slightly progressive over the last 6-12 months.  Patient herself states that after the COVID-19 and Thanksgiving she injured her left  shoulder and then in the aftermath of that after Christmas 2023 she did develop a right-sided DVT and a pulmonary embolism with RV/LV ratio of 1.47 although BNP was normal at that time.  She was discharged slightly after New Year's 2024.  2D echocardiogram done towards the end of January 2024 does not show any right heart strain and it is baseline with grade 1 diastolic dysfunction.  She believes she is slightly more dyspneic than in the past after the pulmonary embolism.  However her shortness of breath scores remained stable and walking desaturation test is stable.  The pulmonary function test though shows a decline in DLCO subsequently [see below].  Her walking desaturation test is stable today.  Lab review also shows a slight amount of anemia in the hospital.  She had high-resolution CT chest and according to the radiologist pulm fibrosis stable x 2 years.  She is tolerating the pirfenidone well.  However she is continue to lose weight.  She is still overweight technically.  SYMPTOM SCALE - ILD 05/06/2019 179# 12/16/2019 172# - esbriet + 05/06/2020 169# - esbriet 07/13/2020 167# - on esbreiet 01/24/2021 164# 08/18/2021 166# - esbriet 02/28/2022 162# 06/05/2022 160#  O2 use ra         Shortness of Breath 0 -> 5 scale with 5 being worst (score 6 If unable to do)         At rest 4 1 0 2 3 1 1 3   Simple tasks - showers, clothes change, eating, shaving 4 1 2 2 4 3 4 5   Household (dishes, doing bed, laundry) 5 5 4 4 5 5 5 4   Shopping 5 3 4 3 4 5 5 5   Walking level at own pace 5 3 4 3 4 5 5 3   Walking up Stairs 5 5 5 4 5 5 5 5   Total (30-36) Dyspnea Score 28 15 19 18 25 24 25 25   How bad is your cough? 2.5 1 0 0 0 2 3 2   How bad is your fatigue  yes 4 3 4 3 4 4 3   How bad is nausea x 0 2 0 2 4 1 1   How bad is vomiting?  x 0 0 0 0 0 0 0  How bad is diarrhea? x 0 0 0 0 0 0 0  How bad is anxiety? x 00 1 2 3 2 4 2   How bad is depression x 0 3 2 2 3 4 1       Simple office walk 185 feet x  3 laps  goal with forehead probe 05/06/2019  12/16/2019  05/06/2020  07/13/2020  01/24/2021  08/18/2021  06/05/2022  O2 used ra ra ra ra ra ra   Number laps completed 3 3 3  goal - stopped at 2 due to dyspnea 3 and did all 3 3 3    Comments about pace avg mod Slow pace avg     Resting Pulse Ox/HR 98% and 90/min 97% and 77/min 98% and 90/min 96% and 94 98% and 90 100% and 84 97% and HR 82  Final Pulse Ox/HR 96% and 100/min 97% and 91/mn 94% and 92 at 2nd lap 93% and 103/min 96% and 99 95% and 96 95% and HR 94  Desaturated </= 88% no no no no no    Desaturated <= 3% points no no Yes,, 4 pint Yes 3 points no    Got Tachycardic >/= 90/min yes yes yes yes     Symptoms at end of test Mod dyspnea, knee pain Dyspnea and fatigue strting at 1st lap and increased by 3rd stoppe early and did drop 3 points Dyspnea at end  Dyspnea mild to mod, light headed No dyspnea  Miscellaneous comments x          HRCT 05/24/22  Narrative & Impression  CLINICAL DATA:  72 year old female with history of interstitial lung disease.   EXAM: CT CHEST WITHOUT CONTRAST   TECHNIQUE: Multidetector CT imaging of the chest was performed following the standard protocol without intravenous contrast. High resolution imaging of the lungs, as well as inspiratory and expiratory imaging, was performed.   RADIATION DOSE REDUCTION: This exam was performed according to the departmental dose-optimization program which includes automated exposure control, adjustment of the mA and/or kV according to patient size and/or use of iterative reconstruction technique.   COMPARISON:  High-resolution chest CT 05/31/2020. Chest CTA 03/17/2022.   FINDINGS: Cardiovascular: Heart size is mildly enlarged. There is no significant pericardial fluid, thickening or pericardial calcification. There is aortic atherosclerosis, as well as atherosclerosis of the great vessels of the mediastinum and the coronary arteries, including calcified atherosclerotic  plaque in the left main, left anterior descending, left circumflex and right coronary arteries. Calcifications of the aortic valve.   Mediastinum/Nodes: No pathologically enlarged mediastinal or hilar lymph nodes. Please note that accurate exclusion of hilar adenopathy is limited on noncontrast CT scans. Esophagus is unremarkable in appearance. No axillary lymphadenopathy.   Lungs/Pleura: High-resolution images again demonstrate widespread areas of peripheral predominant ground-glass attenuation, septal thickening, subpleural reticulation, thickening of the peribronchovascular interstitium, cylindrical bronchiectasis and peripheral bronchiolectasis scattered throughout the lungs bilaterally. No definitive honeycombing confidently identified. No significant progression of disease compared to the prior study. Findings have a mild craniocaudal gradient. Inspiratory and expiratory imaging is unremarkable. No acute consolidative airspace disease. No pleural effusions. A few scattered tiny pulmonary nodules are noted, stable compared to the prior high-resolution chest CT 05/31/2020, largest of which measures only 3 mm in the medial right upper lobe (axial image 31 of series 5), considered definitively benign requiring no imaging follow-up. Small calcified granuloma also noted in the medial right upper lobe. No other larger more suspicious appearing pulmonary nodules or masses are noted.   Upper Abdomen: Aortic atherosclerosis.   Musculoskeletal: There are no aggressive appearing lytic or blastic lesions noted in the visualized portions of the skeleton.   IMPRESSION: 1. The appearance of the lungs remains compatible with interstitial lung disease, once again categorized as probable usual interstitial pneumonia (UIP) per current ATS guidelines. Given the lack of progression compared to prior examinations, the possibility of an alternative etiology such as fibrotic phase nonspecific  interstitial  pneumonia (NSIP) should be considered. 2. Mild cardiomegaly. 3. Aortic atherosclerosis, in addition to left main and three-vessel coronary artery disease. Please note that although the presence of coronary artery calcium documents the presence of coronary artery disease, the severity of this disease and any potential stenosis cannot be assessed on this non-gated CT examination. Assessment for potential risk factor modification, dietary therapy or pharmacologic therapy may be warranted, if clinically indicated. 4. There are calcifications of the aortic valve. Echocardiographic correlation for evaluation of potential valvular dysfunction may be warranted if clinically indicated.   Aortic Atherosclerosis (ICD10-I70.0).     Electronically Signed   By: Vinnie Langton M.D.   On: 05/24/2022 13:25    PFT     Latest Ref Rng & Units 06/05/2022   11:46 AM 11/29/2021   11:32 AM 08/18/2021    9:16 AM 01/24/2021   10:55 AM 07/13/2020    1:01 PM 08/26/2019    1:39 PM 11/19/2018    2:57 PM  PFT Results  FVC-Pre L 2.09  P 2.10  2.14  2.12  2.32  2.36  2.29   FVC-Predicted Pre % 66  P 65  66  66  72  72  70   FVC-Post L       2.30   FVC-Predicted Post %       70   Pre FEV1/FVC % % 78  P 91  90  89  89  88  89   Post FEV1/FCV % %       90   FEV1-Pre L 1.64  P 1.91  1.91  1.88  2.07  2.08  2.04   FEV1-Predicted Pre % 68  P 78  78  77  84  83  82   FEV1-Post L       2.07   DLCO uncorrected ml/min/mmHg 11.10  P 14.80  15.98  14.63  13.98  15.62  15.75   DLCO UNC% % 54  P 72  78  72  68  76  77   DLCO corrected ml/min/mmHg 11.14  P 14.80  15.98  14.63  13.98  15.62    DLCO COR %Predicted % 55  P 72  78  72  68  76    DLVA Predicted % 94  P 113  109  114  107  113  118   TLC L       3.44   TLC % Predicted %       66   RV % Predicted %       50     P Preliminary result     ECHO Jan 2024  - Gr 1 Ddx  BNP dec 2023  - 60    has a past medical history of Coronary artery  calcification seen on CT scan, Essential hypertension, Fibromyalgia, Idiopathic pulmonary fibrosis (HCC), Mixed hyperlipidemia, Osteoporosis, and Type 2 diabetes mellitus ([place]).   reports that she has never smoked. She has been exposed to tobacco smoke. She has never used smokeless tobacco.  Past Surgical History:  Procedure Laterality Date   ABDOMINAL HYSTERECTOMY  2007   CATARACT EXTRACTION Bilateral     Allergies  Allergen Reactions   Latex    Pseudoephedrine Other (See Comments)    Rash and hallucinations    Sulfa Antibiotics    Sulfonamide Derivatives Rash    Immunization History  Administered Date(s) Administered   Fluad Quad(high Dose 65+) 01/29/2019, 12/16/2019, 01/24/2021   Influenza Split 12/18/2017  Influenza-Unspecified 11/18/2013   PFIZER(Purple Top)SARS-COV-2 Vaccination 05/15/2019, 06/13/2019   PNEUMOCOCCAL CONJUGATE-20 03/20/2022   Tdap 05/04/1999    Family History  Problem Relation Age of Onset   Hypertension Mother    Diabetes Mother    Osteoporosis Mother    Hypertension Father    Heart disease Father    COPD Father    Colon cancer Sister    Colon cancer Brother    Heart disease Brother    Heart disease Brother    Emphysema Paternal Grandfather      Current Outpatient Medications:    ACCU-CHEK AVIVA PLUS test strip, , Disp: , Rfl:    Accu-Chek Softclix Lancets lancets, , Disp: , Rfl:    albuterol (VENTOLIN HFA) 108 (90 Base) MCG/ACT inhaler, Inhale 2 puffs into the lungs every 4 (four) hours as needed for wheezing or shortness of breath., Disp: , Rfl:    apixaban (ELIQUIS) 5 MG TABS tablet, Take 1 tablet (5 mg total) by mouth 2 (two) times daily. Start in 1 month after completing initial Starter pack, Disp: 60 tablet, Rfl: 4   Calcium Carbonate (CALTRATE 600 PO), Take 1 tablet by mouth 2 (two) times a day., Disp: , Rfl:    clonazePAM (KLONOPIN) 0.5 MG tablet, Take 0.5 mg by mouth at bedtime., Disp: , Rfl:    cyanocobalamin (VITAMIN B12) 1000 MCG  tablet, Take 1,000 mcg by mouth daily., Disp: , Rfl:    ESBRIET 801 MG TABS, Take 801 mg by mouth 3 (three) times daily with meals., Disp: 270 tablet, Rfl: 1   fenofibrate (TRICOR) 48 MG tablet, Take 48 mg by mouth at bedtime., Disp: , Rfl:    FLOVENT HFA 110 MCG/ACT inhaler, INHALE 1 PUFF INTO THE LUNGS 2 TIMES A DAY., Disp: 12 g, Rfl: 0   fluticasone (FLONASE) 50 MCG/ACT nasal spray, Place 2 sprays into both nostrils daily., Disp: , Rfl:    levocetirizine (XYZAL) 5 MG tablet, Take 5 mg by mouth daily., Disp: , Rfl:    losartan (COZAAR) 50 MG tablet, Take 50 mg by mouth daily., Disp: , Rfl:    metFORMIN (GLUCOPHAGE-XR) 500 MG 24 hr tablet, Take 500 mg by mouth daily with breakfast. , Disp: , Rfl:    metoprolol succinate (TOPROL-XL) 100 MG 24 hr tablet, Take 100 mg by mouth daily. Take with or immediately following a meal., Disp: , Rfl:    omeprazole (PRILOSEC) 20 MG capsule, TAKE 1 CAPSULE BY MOUTH ONCE DAILY. (Patient taking differently: Take 20 mg by mouth daily.), Disp: 30 capsule, Rfl: 5   REPATHA SURECLICK [id number] MG/ML SOAJ, Inject 1 mL into the skin every 14 (fourteen) days., Disp: , Rfl:    SAVELLA 50 MG TABS tablet, Take 50 mg by mouth 2 (two) times daily. , Disp: , Rfl:    spironolactone (ALDACTONE) 25 MG tablet, Take 0.5 tablets (12.5 mg total) by mouth daily., Disp: 45 tablet, Rfl: 1   tiZANidine (ZANAFLEX) 4 MG tablet, TAKE 1 TABLET BY MOUTH AT BEDTIME., Disp: 30 tablet, Rfl: 0      Objective:   Vitals:   06/05/22 1321  BP: 124/68  Pulse: 80  SpO2: 93%  Weight: 160 lb (72.6 kg)  Height: 5\' 5"  (1.651 m)    Estimated body mass index is 26.63 kg/m as calculated from the following:   Height as of this encounter: 5\' 5"  (1.651 m).   Weight as of this encounter: 160 lb (72.6 kg).  @WEIGHTCHANGE @  [employer]   06/05/22 1321  Weight: 160 lb (72.6 kg)     Physical ExamGeneral: No distress. Looks well Neuro: Alert and Oriented x 3. GCS 15. Speech normal Psych:  Pleasant Resp:  Barrel Chest - no.  Wheeze - no, Crackles - mild maybe, No overt respiratory distress CVS: Normal heart sounds. Murmurs - no Ext: Stigmata of Connective Tissue Disease - no HEENT: Normal upper airway. PEERL +. No post nasal drip        Assessment:       ICD-10-CM   1. IPF (idiopathic pulmonary fibrosis) (HCC)  J84.112 Hepatic function panel    CBC w/Diff    Pulmonary function test    CBC w/Diff    Hepatic function panel    2. Medication monitoring encounter  Z51.81 Hepatic function panel    CBC w/Diff    CBC w/Diff    Hepatic function panel         Plan:     Patient Instructions  IPF (idiopathic pulmonary fibrosis) ([place]) Medication monitoring encounter Gastroesophageal reflux disease, unspecified whether esophagitis present Dyspnea on exertion   -Normal echocardiogram  Jan 2024 and normal BNP dec 2023 make pulmonary hypertension unlikely - According to radiology - disease is stable but according to breathing test - disesase is slowly progressive   Plan -Continue acid reflux medications -Continue pirfenidone per schedule   -At this point in time benefit outweighs risk  -Continue space 5-6h between meals and apply sunscreen when going out -We discussed clinical trials as a care option and appreciate your interest  -meet LeighAnn of [facility] to enroll in bone  scan DEXA study if you qualif - Check LFT, 06/05/2022 and then LFT every 3 months - Do spiro/dlco in 3-4 months  Drug-induced weight loss  - continued weight loss slowly with pirfenidone ; 19# in 3 yueras - weight 160# and BMI 26.63  -At this point in time is still are technically overweight but you are getting closer to normal weight -Therefore we will leverage pirfenidone and helping you with your weight loss -We will continue to monitor -Do talk to primary care physician about other reasons for weight loss   Pulmonary EMbolism with Rt lower extremity DVT Dec 2023  - ? Due to covid as  risk factor and shoulder injury  Plan  - continue eliquis as before;' probably reassess in summer 2024 lenth of treatment - check cbc 06/05/2022 - check d-dimer at [facility]     Followup -Return to see  DR Chase Caller in 3-4 months - 30 min visit but after spiro/dlco    ( Level 05 visit: Estb 40-54 min in  visit type: on-site physical face to visit  in total care time and counseling or/and coordination of care by this undersigned MD - Dr Brand Males. This includes one or more of the following on this same day 06/05/2022: pre-charting, chart review, note writing, documentation discussion of test results, diagnostic or treatment recommendations, prognosis, risks and benefits of management options, instructions, education, compliance or risk-factor reduction. It excludes time spent by the [place] or office staff in the care of the patient. Actual time 40 min)    SIGNATURE    Dr. Brand Males, M.D., F.C.C.P,  Pulmonary and Critical Care Medicine Staff Physician, [place] Director - Interstitial Lung Disease  Program  P[facility] [place] [facility] [place], [place], [address]  Pager: [phone], If no answer or between  15:00h - 7:00h: call [phone] Telephone: [phone]  2:00 PM 06/05/2022

## 2022-06-30 DIAGNOSIS — H524 Presbyopia: Secondary | ICD-10-CM | POA: Diagnosis not present

## 2022-06-30 DIAGNOSIS — H43813 Vitreous degeneration, bilateral: Secondary | ICD-10-CM | POA: Diagnosis not present

## 2022-07-05 NOTE — Progress Notes (Deleted)
Office Visit Note  Patient: Nichole Reyes             Date of Birth: Sep 10, 1950           MRN: [id number]             PCP: Lianne Moris, PA-C Referring: Lianne Moris, PA-C Visit Date: 07/18/2022 Occupation: @  Subjective:  No chief complaint on file.   History of Present Illness: Nichole Reyes is a 72 y.o. female ***     Activities of Daily Living:  Patient reports morning stiffness for *** {minute/hour:19697}.   Patient {ACTIONS;DENIES/REPORTS:21021675::"Denies"} nocturnal pain.  Difficulty dressing/grooming: {ACTIONS;DENIES/REPORTS:21021675::"Denies"} Difficulty climbing stairs: {ACTIONS;DENIES/REPORTS:21021675::"Denies"} Difficulty getting out of chair: {ACTIONS;DENIES/REPORTS:21021675::"Denies"} Difficulty using hands for taps, buttons, cutlery, and/or writing: {ACTIONS;DENIES/REPORTS:21021675::"Denies"}  No Rheumatology ROS completed.   PMFS History:  Patient Active Problem List   Diagnosis Date Noted   Closed fracture of proximal end of left humerus with routine healing 05/19/2022   DVT (deep venous thrombosis) 04/21/2022   Acute pulmonary embolism 03/17/2022   Type 2 diabetes mellitus 03/17/2022   Essential hypertension 03/17/2022   Coronary artery calcification seen on CT scan 03/17/2022   IPF (idiopathic pulmonary fibrosis) 09/29/2021   DOE (dyspnea on exertion) 09/29/2021   ILD (interstitial lung disease) 08/26/2019   Fibromyalgia syndrome 06/26/2016   Other fatigue 06/26/2016   Other insomnia 06/26/2016   Arthralgia of both knees 06/26/2016   Pain in both feet 06/26/2016   Primary osteoarthritis of both feet 06/26/2016   Primary osteoarthritis of both knees 06/26/2016   DDD (degenerative disc disease), cervical 06/26/2016   DDD (degenerative disc disease), thoracic 06/26/2016   DDD (degenerative disc disease), lumbar 06/26/2016   History of hypertension 06/26/2016   History of high cholesterol 06/26/2016   Other sleep apnea 06/26/2016    History of osteopenia 06/26/2016   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   THYROID NODULE, LEFT 01/03/2008   Hyperlipidemia 01/03/2008   DEPRESSION 01/03/2008   MIGRAINE HEADACHE 01/03/2008   Allergic rhinitis 01/03/2008   ALOPECIA 01/03/2008   ALLERGIC ARTHRITIS SITE UNSPECIFIED 01/03/2008    Past Medical History:  Diagnosis Date   Coronary artery calcification seen on CT scan    Essential hypertension    Fibromyalgia    Idiopathic pulmonary fibrosis (HCC)    Mixed hyperlipidemia    Osteoporosis    Type 2 diabetes mellitus (HCC)     Family History  Problem Relation Age of Onset   Hypertension Mother    Diabetes Mother    Osteoporosis Mother    Hypertension Father    Heart disease Father    COPD Father    Colon cancer Sister    Colon cancer Brother    Heart disease Brother    Heart disease Brother    Emphysema Paternal Grandfather    Past Surgical History:  Procedure Laterality Date   ABDOMINAL HYSTERECTOMY  2007   CATARACT EXTRACTION Bilateral    Social History   Social History Narrative   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   Immunization History  Administered Date(s) Administered   Fluad Quad(high Dose 65+) 01/29/2019, 12/16/2019, 01/24/2021   Influenza Split 12/18/2017   Influenza-Unspecified 11/18/2013   PFIZER(Purple Top)SARS-COV-2 Vaccination 05/15/2019, 06/13/2019   PNEUMOCOCCAL CONJUGATE-20 03/20/2022   Tdap 05/04/1999     Objective: Vital Signs: There were no vitals taken for this visit.   Physical Exam   Musculoskeletal Exam: ***  CDAI Exam: CDAI Score: -- Patient Global: --; Provider  Global: -- Swollen: --; Tender: -- Joint Exam 07/18/2022   No joint exam has been documented for this visit   There is currently no information documented on the homunculus. Go to the Rheumatology activity and complete the homunculus joint exam.  Investigation: No additional findings.  Imaging: No results found.  Recent  Labs: Lab Results  Component Value Date   WBC 10.5 06/05/2022   HGB 13.6 06/05/2022   PLT 455.0 (H) 06/05/2022   NA 139 03/18/2022   K 3.5 03/18/2022   CL 107 03/18/2022   CO2 23 03/18/2022   GLUCOSE 153 (H) 03/18/2022   BUN 12 03/18/2022   CREATININE 0.56 03/18/2022   BILITOT 0.2 06/05/2022   ALKPHOS 89 06/05/2022   AST 19 06/05/2022   ALT 15 06/05/2022   PROT 7.4 06/05/2022   ALBUMIN 4.1 06/05/2022   CALCIUM 9.5 03/18/2022   GFRAA 101 03/15/2017    Speciality Comments: No specialty comments available.  Procedures:  No procedures performed Allergies: Latex, Pseudoephedrine, Sulfa antibiotics, and Sulfonamide derivatives   Assessment / Plan:     Visit Diagnoses: No diagnosis found.  Orders: No orders of the defined types were placed in this encounter.  No orders of the defined types were placed in this encounter.   Face-to-face time spent with patient was *** minutes. Greater than 50% of time was spent in counseling and coordination of care.  Follow-Up Instructions: No follow-ups on file.   Ellen Henri, CMA  Note - This record has been created using Animal nutritionist.  Chart creation errors have been sought, but may not always  have been located. Such creation errors do not reflect on  the standard of medical care.

## 2022-07-08 ENCOUNTER — Other Ambulatory Visit: Payer: Self-pay | Admitting: Physician Assistant

## 2022-07-10 NOTE — Telephone Encounter (Signed)
Last Fill: 05/05/2022  Next Visit: 07/18/2022  Last Visit: 01/10/2022   Dx: Other insomnia   Current Dose per office note on 01/10/2022: zanaflex 4 mg at bedtime for muscle spasms and insomnia.   Okay to refill Tizanidine?

## 2022-07-11 ENCOUNTER — Telehealth: Payer: Self-pay | Admitting: Cardiology

## 2022-07-11 MED ORDER — APIXABAN 5 MG PO TABS: 5.0000 mg | ORAL_TABLET | Freq: Two times a day (BID) | ORAL | 0 refills | Status: DC

## 2022-07-11 NOTE — Telephone Encounter (Signed)
Called and spoke with pt about switching from Eliquis to warfarin due to cost. She would like to stay on Eliquis if possible.  She has never applied for pt assistance for Eliquis but does get pat assistance for Repatha.  Will forward message to Carlye Grippe RN to see if she can help pt with application instructions and possible Elquis samples if needed in the mean time.

## 2022-07-11 NOTE — Telephone Encounter (Signed)
Advised that eliquis samples are available for pick up along with BMS-PAF that she is to fill out all patient sections and bring back to office with 2024 proof of income (social security benefit letter) and prescription out of pocket expense for 2024 for herself and husband. Verbalized understanding.

## 2022-07-11 NOTE — Telephone Encounter (Signed)
Pt c/o medication issue:  1. Name of Medication: apixaban (ELIQUIS) 5 MG TABS tablet   2. How are you currently taking this medication (dosage and times per day)? As prescribed  3. Are you having a reaction (difficulty breathing--STAT)? No   4. What is your medication issue? Patient is calling stating she can no longer afford the medication due to it costing over $100 for her last refill. She is requesting a callback to discuss a cheaper alternative.

## 2022-07-12 ENCOUNTER — Institutional Professional Consult (permissible substitution) (HOSPITAL_BASED_OUTPATIENT_CLINIC_OR_DEPARTMENT_OTHER): Payer: Medicare Other | Admitting: Internal Medicine

## 2022-07-18 ENCOUNTER — Ambulatory Visit: Payer: Medicare Other | Admitting: Rheumatology

## 2022-07-18 DIAGNOSIS — M797 Fibromyalgia: Secondary | ICD-10-CM

## 2022-07-18 DIAGNOSIS — Z8669 Personal history of other diseases of the nervous system and sense organs: Secondary | ICD-10-CM

## 2022-07-18 DIAGNOSIS — Z8261 Family history of arthritis: Secondary | ICD-10-CM

## 2022-07-18 DIAGNOSIS — M19071 Primary osteoarthritis, right ankle and foot: Secondary | ICD-10-CM

## 2022-07-18 DIAGNOSIS — M8589 Other specified disorders of bone density and structure, multiple sites: Secondary | ICD-10-CM

## 2022-07-18 DIAGNOSIS — M5134 Other intervertebral disc degeneration, thoracic region: Secondary | ICD-10-CM

## 2022-07-18 DIAGNOSIS — M4004 Postural kyphosis, thoracic region: Secondary | ICD-10-CM

## 2022-07-18 DIAGNOSIS — J849 Interstitial pulmonary disease, unspecified: Secondary | ICD-10-CM

## 2022-07-18 DIAGNOSIS — M5030 Other cervical disc degeneration, unspecified cervical region: Secondary | ICD-10-CM

## 2022-07-18 DIAGNOSIS — G8929 Other chronic pain: Secondary | ICD-10-CM

## 2022-07-18 DIAGNOSIS — M5136 Other intervertebral disc degeneration, lumbar region: Secondary | ICD-10-CM

## 2022-07-18 DIAGNOSIS — Z8659 Personal history of other mental and behavioral disorders: Secondary | ICD-10-CM

## 2022-07-18 DIAGNOSIS — R5383 Other fatigue: Secondary | ICD-10-CM

## 2022-07-18 DIAGNOSIS — G4709 Other insomnia: Secondary | ICD-10-CM

## 2022-07-18 DIAGNOSIS — Z8639 Personal history of other endocrine, nutritional and metabolic disease: Secondary | ICD-10-CM

## 2022-07-18 DIAGNOSIS — M170 Bilateral primary osteoarthritis of knee: Secondary | ICD-10-CM

## 2022-07-18 DIAGNOSIS — Z8679 Personal history of other diseases of the circulatory system: Secondary | ICD-10-CM

## 2022-07-18 LAB — PULMONARY FUNCTION TEST
DL/VA % pred: 96 %
DL/VA: 3.98 ml/min/mmHg/L
DLCO cor % pred: 58 %
DLCO cor: 11.75 ml/min/mmHg
DLCO unc % pred: 58 %
DLCO unc: 11.75 ml/min/mmHg
FEF 25-75 Pre: 3.42 L/sec
FEF2575-%Pred-Pre: 170 %
FEV1-%Pred-Pre: 79 %
FEV1-Pre: 1.9 L
FEV1FVC-%Pred-Pre: 118 %
FEV6-%Pred-Pre: 69 %
FEV6-Pre: 2.1 L
FEV6FVC-%Pred-Pre: 104 %
FVC-%Pred-Pre: 66 %
FVC-Pre: 2.1 L
Pre FEV1/FVC ratio: 90 %
Pre FEV6/FVC Ratio: 100 %

## 2022-07-19 NOTE — Progress Notes (Unsigned)
Office Visit Note  Patient: Nichole Reyes             Date of Birth: 08-31-1950           MRN: [id number]             PCP: Lianne Moris, PA-C Referring: Lianne Moris, PA-C Visit Date: 07/26/2022 Occupation: @GUAROCC @  Subjective:  Right knee joint pain   History of Present Illness: Nichole Reyes is a 72 y.o. female with history of osteoarthritis, DDD, and fibromyalgia.  Patient presents today with ongoing pain and stiffness in the right knee joint.  She has been experiencing a buckling sensation intermittently.  Patient states that a couple years ago she felt a popping sensation in her right knee which prompted further evaluation.  She previously had a right knee joint cortisone injection on 05/02/2021 but had no improvement in her symptoms.  She has been having difficulty performing yard work and with certain activities due to the severity of pain in her right knee.  She has noticed swelling in her right knee joint intermittently.  She has been taking Tylenol for pain relief.  She has not had any relief with topical agents, ice, bracing.  She states she has occasional discomfort and stiffness in her right hand but denies any joint swelling at this time. She continues to have generalized myalgias and muscle tenderness due to fibromyalgia.  She takes zanaflex 4 mg at bedtime as needed for muscle spasms.   She experiences coughing intermittently has occasional shortness of breath.  She remains on Esbriet as prescribed.  She is tolerating Esbriet without any side effects. She is overdue to update DEXA.    Activities of Daily Living:  Patient reports morning stiffness for 2 minutes.   Patient Reports nocturnal pain.  Difficulty dressing/grooming: Denies Difficulty climbing stairs: Reports Difficulty getting out of chair: Reports Difficulty using hands for taps, buttons, cutlery, and/or writing: Denies  Review of Systems  Constitutional:  Positive for fatigue.  HENT:  Negative for  mouth sores and mouth dryness.   Eyes:  Negative for dryness.  Respiratory:  Positive for shortness of breath.   Cardiovascular:  Negative for chest pain and palpitations.  Gastrointestinal:  Negative for blood in stool, constipation and diarrhea.  Endocrine: Positive for increased urination.  Genitourinary:  Negative for involuntary urination.  Musculoskeletal:  Positive for joint pain, gait problem, joint pain, joint swelling, myalgias, muscle weakness, morning stiffness, muscle tenderness and myalgias.  Skin:  Positive for sensitivity to sunlight. Negative for color change, rash and hair loss.  Allergic/Immunologic: Negative for susceptible to infections.  Neurological:  Positive for headaches. Negative for dizziness.  Hematological:  Negative for swollen glands.  Psychiatric/Behavioral:  Positive for depressed mood and sleep disturbance. The patient is nervous/anxious.     PMFS History:  Patient Active Problem List   Diagnosis Date Noted   Closed fracture of proximal end of left humerus with routine healing 05/19/2022   DVT (deep venous thrombosis) (HCC) 04/21/2022   Acute pulmonary embolism (HCC) 03/17/2022   Type 2 diabetes mellitus (HCC) 03/17/2022   Essential hypertension 03/17/2022   Coronary artery calcification seen on CT scan 03/17/2022   IPF (idiopathic pulmonary fibrosis) (HCC) 09/29/2021   DOE (dyspnea on exertion) 09/29/2021   ILD (interstitial lung disease) (HCC) 08/26/2019   Fibromyalgia syndrome 06/26/2016   Other fatigue 06/26/2016   Other insomnia 06/26/2016   Arthralgia of both knees 06/26/2016   Pain in both feet 06/26/2016   Primary  osteoarthritis of both feet 06/26/2016   Primary osteoarthritis of both knees 06/26/2016   DDD (degenerative disc disease), cervical 06/26/2016   DDD (degenerative disc disease), thoracic 06/26/2016   DDD (degenerative disc disease), lumbar 06/26/2016   History of hypertension 06/26/2016   History of high cholesterol  06/26/2016   Other sleep apnea 06/26/2016   History of osteopenia 06/26/2016   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   THYROID NODULE, LEFT 01/03/2008   Hyperlipidemia 01/03/2008   DEPRESSION 01/03/2008   MIGRAINE HEADACHE 01/03/2008   Allergic rhinitis 01/03/2008   ALOPECIA 01/03/2008   ALLERGIC ARTHRITIS SITE UNSPECIFIED 01/03/2008    Past Medical History:  Diagnosis Date   Broken humerus 02/2022   right   Coronary artery calcification seen on CT scan    Essential hypertension    Fibromyalgia    Idiopathic pulmonary fibrosis (HCC)    Mixed hyperlipidemia    Osteoporosis    Type 2 diabetes mellitus (HCC)     Family History  Problem Relation Age of Onset   Hypertension Mother    Diabetes Mother    Osteoporosis Mother    Hypertension Father    Heart disease Father    COPD Father    Colon cancer Sister    Colon cancer Brother    Heart disease Brother    Heart disease Brother    Emphysema Paternal Grandfather    Past Surgical History:  Procedure Laterality Date   ABDOMINAL HYSTERECTOMY  2007   CATARACT EXTRACTION Bilateral    Social History   Social History Narrative   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   Immunization History  Administered Date(s) Administered   Fluad Quad(high Dose 65+) 01/29/2019, 12/16/2019, 01/24/2021   Influenza Split 12/18/2017   Influenza-Unspecified 11/18/2013   PFIZER(Purple Top)SARS-COV-2 Vaccination 05/15/2019, 06/13/2019   PNEUMOCOCCAL CONJUGATE-20 03/20/2022   Tdap 05/04/1999     Objective: Vital Signs: BP 128/72 (BP Location: Left Arm, Patient Position: Sitting, Cuff Size: Normal)   Pulse 82   Resp 14   Ht 5\' 5"  (1.651 m)   Wt 163 lb (73.9 kg)   BMI 27.12 kg/m    Physical Exam Vitals and nursing note reviewed.  Constitutional:      Appearance: She is well-developed.  HENT:     Head: Normocephalic and atraumatic.  Eyes:     Conjunctiva/sclera: Conjunctivae normal.  Cardiovascular:      Rate and Rhythm: Normal rate and regular rhythm.     Heart sounds: Normal heart sounds.  Pulmonary:     Effort: Pulmonary effort is normal.     Breath sounds: Normal breath sounds.  Abdominal:     General: Bowel sounds are normal.     Palpations: Abdomen is soft.  Musculoskeletal:     Cervical back: Normal range of motion.  Lymphadenopathy:     Cervical: No cervical adenopathy.  Skin:    General: Skin is warm and dry.     Capillary Refill: Capillary refill takes less than 2 seconds.  Neurological:     Mental Status: She is alert and oriented to person, place, and time.  Psychiatric:        Behavior: Behavior normal.      Musculoskeletal Exam: C-spine has limited range of motion with lateral rotation.  Thoracic kyphosis noted.  Limited mobility of the lumbar spine.  Shoulder joints, elbow joints, wrist joints, MCPs, PIPs, DIPs have good range of motion with no synovitis.  PIP and DIP thickening noted.  Complete  fist formation noted bilaterally.  Hip joints have good range of motion with no groin pain.  Painful range of motion of the right knee with some fullness.  Discomfort with range of motion of the right knee joint.  Left knee joint has good range of motion with no warmth or effusion.  Ankle joints have good range of motion with no tenderness or joint swelling.  CDAI Exam: CDAI Score: -- Patient Global: --; Provider Global: -- Swollen: --; Tender: -- Joint Exam 07/26/2022   No joint exam has been documented for this visit   There is currently no information documented on the homunculus. Go to the Rheumatology activity and complete the homunculus joint exam.  Investigation: No additional findings.  Imaging: XR KNEE 3 VIEW RIGHT  Result Date: 07/26/2022 Moderate lateral compartment narrowing was noted.  Intercondylar osteophytes were noted.  Mild patellofemoral narrowing was noted.  No chondrocalcinosis was noted.  No radiographic progression was noted when compared to the  x-rays of 2023. Impression: These findings with suggestive of moderate osteoarthritis of the knee.   Recent Labs: Lab Results  Component Value Date   WBC 10.5 06/05/2022   HGB 13.6 06/05/2022   PLT 455.0 (H) 06/05/2022   NA 139 03/18/2022   K 3.5 03/18/2022   CL 107 03/18/2022   CO2 23 03/18/2022   GLUCOSE 153 (H) 03/18/2022   BUN 12 03/18/2022   CREATININE 0.56 03/18/2022   BILITOT 0.2 06/05/2022   ALKPHOS 89 06/05/2022   AST 19 06/05/2022   ALT 15 06/05/2022   PROT 7.4 06/05/2022   ALBUMIN 4.1 06/05/2022   CALCIUM 9.5 03/18/2022   GFRAA 101 03/15/2017    Speciality Comments: No specialty comments available.  Procedures:  No procedures performed Allergies: Latex, Pseudoephedrine, Sulfa antibiotics, and Sulfonamide derivatives   Assessment / Plan:     Visit Diagnoses: Primary osteoarthritis of both knees: Patient presents today with increased pain in both knee joints especially her right knee.  Several years ago she was in her kitchen and felt and heard a pop in her right knee.  Since then she has had ongoing discomfort.  On 05/02/2021 x-rays of the right knee were obtained which were consistent with moderate osteoarthritis in the right knee.  She had a right knee joint cortisone injection at that time with 0% improvement.  She has been noticing increased swelling as well as difficulty ambulating due to severity of pain in her right knee.  At times she is also had a buckling and catching sensation in her right knee.  She has had increased difficulty performing ADLs as well as yard work due to the severity of symptoms.  She has painful range of motion of the right knee on examination today.  Fullness was noted.  Some knee crepitus was noted.  Left knee has good range of motion with no warmth or effusion.  X-rays of the right knee were updated today.  MRI of the right knee will be ordered for further evaluation due to the chronicity of her pain, mechanical symptoms, and inadequate  response to a cortisone injection.   Chronic pain of right knee -As stated above the patient has been experiencing chronic pain in the right knee and has started to have intermittent mechanical symptoms.  She has tried to remain active but has had increased difficulty with ADLs and performing yard work due to the severity of symptoms.  She had an inadequate response to a cortisone injection in the past.  She declined a  repeat cortisone injection today.  MRI of the right knee ordered. plan: XR KNEE 3 VIEW RIGHT, MR KNEE RIGHT WO CONTRAST  Primary osteoarthritis of both feet: She is not experiencing any increased discomfort in her feet at this time.  DDD (degenerative disc disease), cervical: C-spine has limited range of motion.  Postural kyphosis of thoracic region  DDD (degenerative disc disease), thoracic: Thoracic kyphosis noted.  DDD (degenerative disc disease), lumbar: Not currently experiencing any increased discomfort in her lower back.  She has limited mobility of the lumbar spine.  ILD (interstitial lung disease) (HCC) - Dyspnea started summer 2020.  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  Followed  by Dr. Marchelle Gearing. Reviewed Dr. Becky Augusta office visit note from 06/05/22: Normal echocardiogram  Jan 2024 and normal BNP dec 2023 make pulmonary hypertension unlikely.  According to high resolution Chest CT--disease is stable but according to PFTs - disesase is slowly progressive.  -Continue acid reflux medications -Continue pirfenidone per schedule  - Do spiro/dlco in 3-4 months  Fibromyalgia: She has generalized hyperalgesia and positive tender points on exam.  She experiences interval myalgias and muscle tenderness due to fibromyalgia.  She takes tizanidine 4 mg at bedtime as needed for muscle spasms.  She does not need a refill at this time.  Discussed the importance of regular exercise and good sleep hygiene.  Other fatigue: Chronic, stable.    Other insomnia: She is taking tizanidine 4 mg at bedtime as needed for muscle spasms and insomnia.   Osteopenia of multiple sites - DEXA updated on 08/12/19: L1-L4 BMD 0.813 with T-score -2.1. DXA ordered by PCP. Thoracic kyphosis noted. No history of fractures.  Overdue to update DEXA.  Patient plans on reaching out to her PCP tomorrow to have an updated bone density order placed.  We can review results with the patient once completed.  Other medical conditions are listed as follows:   Family history of rheumatoid arthritis - Maternal grandmother  History of hypertension: BP was 128/72 today in the office.   History of hypercholesterolemia  History of migraine  History of sleep apnea  History of depression   Orders: Orders Placed This Encounter  Procedures   XR KNEE 3 VIEW RIGHT   MR KNEE RIGHT WO CONTRAST   No orders of the defined types were placed in this encounter.    Follow-Up Instructions: Return in about 6 months (around 01/26/2023) for Osteoarthritis, Fibromyalgia.   Gearldine Bienenstock, PA-C  Note - This record has been created using Dragon software.  Chart creation errors have been sought, but may not always  have been located. Such creation errors do not reflect on  the standard of medical care.

## 2022-07-25 DIAGNOSIS — S30860A Insect bite (nonvenomous) of lower back and pelvis, initial encounter: Secondary | ICD-10-CM | POA: Diagnosis not present

## 2022-07-25 DIAGNOSIS — W57XXXA Bitten or stung by nonvenomous insect and other nonvenomous arthropods, initial encounter: Secondary | ICD-10-CM | POA: Diagnosis not present

## 2022-07-26 ENCOUNTER — Ambulatory Visit: Payer: Medicare Other | Attending: Rheumatology | Admitting: Physician Assistant

## 2022-07-26 ENCOUNTER — Encounter: Payer: Self-pay | Admitting: Physician Assistant

## 2022-07-26 ENCOUNTER — Ambulatory Visit (INDEPENDENT_AMBULATORY_CARE_PROVIDER_SITE_OTHER): Payer: Medicare Other

## 2022-07-26 VITALS — BP 128/72 | HR 82 | Resp 14 | Ht 65.0 in | Wt 163.0 lb

## 2022-07-26 DIAGNOSIS — M25561 Pain in right knee: Secondary | ICD-10-CM

## 2022-07-26 DIAGNOSIS — M5030 Other cervical disc degeneration, unspecified cervical region: Secondary | ICD-10-CM | POA: Diagnosis not present

## 2022-07-26 DIAGNOSIS — G4709 Other insomnia: Secondary | ICD-10-CM

## 2022-07-26 DIAGNOSIS — M797 Fibromyalgia: Secondary | ICD-10-CM

## 2022-07-26 DIAGNOSIS — J849 Interstitial pulmonary disease, unspecified: Secondary | ICD-10-CM | POA: Diagnosis not present

## 2022-07-26 DIAGNOSIS — M4004 Postural kyphosis, thoracic region: Secondary | ICD-10-CM | POA: Diagnosis not present

## 2022-07-26 DIAGNOSIS — Z8659 Personal history of other mental and behavioral disorders: Secondary | ICD-10-CM

## 2022-07-26 DIAGNOSIS — M170 Bilateral primary osteoarthritis of knee: Secondary | ICD-10-CM

## 2022-07-26 DIAGNOSIS — Z8679 Personal history of other diseases of the circulatory system: Secondary | ICD-10-CM

## 2022-07-26 DIAGNOSIS — G8929 Other chronic pain: Secondary | ICD-10-CM | POA: Diagnosis not present

## 2022-07-26 DIAGNOSIS — M19071 Primary osteoarthritis, right ankle and foot: Secondary | ICD-10-CM | POA: Diagnosis not present

## 2022-07-26 DIAGNOSIS — M8589 Other specified disorders of bone density and structure, multiple sites: Secondary | ICD-10-CM

## 2022-07-26 DIAGNOSIS — M5136 Other intervertebral disc degeneration, lumbar region: Secondary | ICD-10-CM

## 2022-07-26 DIAGNOSIS — R5383 Other fatigue: Secondary | ICD-10-CM | POA: Diagnosis not present

## 2022-07-26 DIAGNOSIS — M19072 Primary osteoarthritis, left ankle and foot: Secondary | ICD-10-CM

## 2022-07-26 DIAGNOSIS — Z8261 Family history of arthritis: Secondary | ICD-10-CM

## 2022-07-26 DIAGNOSIS — M51369 Other intervertebral disc degeneration, lumbar region without mention of lumbar back pain or lower extremity pain: Secondary | ICD-10-CM

## 2022-07-26 DIAGNOSIS — M5134 Other intervertebral disc degeneration, thoracic region: Secondary | ICD-10-CM

## 2022-07-26 DIAGNOSIS — Z8669 Personal history of other diseases of the nervous system and sense organs: Secondary | ICD-10-CM

## 2022-07-26 DIAGNOSIS — Z8639 Personal history of other endocrine, nutritional and metabolic disease: Secondary | ICD-10-CM

## 2022-07-31 ENCOUNTER — Other Ambulatory Visit: Payer: Self-pay | Admitting: Internal Medicine

## 2022-07-31 DIAGNOSIS — R0609 Other forms of dyspnea: Secondary | ICD-10-CM

## 2022-07-31 DIAGNOSIS — J849 Interstitial pulmonary disease, unspecified: Secondary | ICD-10-CM

## 2022-07-31 DIAGNOSIS — K219 Gastro-esophageal reflux disease without esophagitis: Secondary | ICD-10-CM

## 2022-08-02 ENCOUNTER — Other Ambulatory Visit: Payer: Self-pay | Admitting: Internal Medicine

## 2022-08-02 DIAGNOSIS — R0609 Other forms of dyspnea: Secondary | ICD-10-CM

## 2022-08-02 DIAGNOSIS — J849 Interstitial pulmonary disease, unspecified: Secondary | ICD-10-CM

## 2022-08-02 NOTE — Telephone Encounter (Signed)
Requested Renewals   Name from pharmacy: FLOVENT HFA 110 MCG INHALER       Will file in chart as: FLOVENT HFA 110 MCG/ACT inhaler    Possible duplicate: Hover to review recent actions on this medication   Sig: INHALE 1 PUFF INTO THE LUNGS 2 TIMES A DAY.   Disp: Not specified (Pharmacy requested: 12 tablet)    Refills: 0   Start: 08/02/2022   Class: Normal   For: ILD (interstitial lung disease) (HCC), DOE (dyspnea on exertion)   To pharmacy: INSURANCE PREFERS QVAR , BRAND FLOVENT UNAVAILIBLE, GENERIC FLOVENT NOT COVERED. PLEASE SEND SCRIPT FOR QVAR   Last ordered: 2 days ago (07/31/2022) by Kalman Shan, MD   Last refill: 07/30/2022   Rx #: [id number]     To be filled at: [facility] - [place], [place] - [address]       Routing to Dr. Marchelle Gearing for review.

## 2022-08-03 ENCOUNTER — Telehealth: Payer: Self-pay | Admitting: Internal Medicine

## 2022-08-03 NOTE — Telephone Encounter (Signed)
Patient would like the nurse or doctor to call regarding her Flovent medication.  She stated she is completely out and her insurance is not covering this medication.  She would like an alternative sent in.  Please call patient to discuss further at [phone]

## 2022-08-04 NOTE — Telephone Encounter (Signed)
Patient will have to call insurance company and find out an alternative.  And let us know.  Arnuity would be an option

## 2022-08-04 NOTE — Telephone Encounter (Signed)
Spoke to pharmacy and the pharmacist stated pt's ins will not pay for the generic of fluticasone (FLOVENT HFA) 110 MCG/ACT inhaler and the brand is not available so pt needs an alternative. I will send Dr Marchelle Gearing a message for his suggestions.

## 2022-08-07 ENCOUNTER — Other Ambulatory Visit (HOSPITAL_COMMUNITY): Payer: Self-pay

## 2022-08-07 NOTE — Telephone Encounter (Signed)
Patient checking on message for inhalers. Patient phone number is [phone].

## 2022-08-07 NOTE — Telephone Encounter (Signed)
Per test claims covered ICS are:   Qvar- $53.88 Arnuity-$52.57

## 2022-08-08 NOTE — Telephone Encounter (Signed)
Arnuity is the modern version of Flovent.  She can do the 100 mcg version once daily.  This is probably the cheaper she can get an inhaler

## 2022-08-08 NOTE — Telephone Encounter (Signed)
Dr. Marchelle Gearing please see message below from PA team in regards to inhalers  Per test claims covered ICS are:    Qvar- $53.88 Arnuity-$52.57

## 2022-08-09 DIAGNOSIS — E538 Deficiency of other specified B group vitamins: Secondary | ICD-10-CM | POA: Diagnosis not present

## 2022-08-09 DIAGNOSIS — M797 Fibromyalgia: Secondary | ICD-10-CM | POA: Diagnosis not present

## 2022-08-09 DIAGNOSIS — R739 Hyperglycemia, unspecified: Secondary | ICD-10-CM | POA: Diagnosis not present

## 2022-08-09 DIAGNOSIS — E785 Hyperlipidemia, unspecified: Secondary | ICD-10-CM | POA: Diagnosis not present

## 2022-08-09 DIAGNOSIS — I10 Essential (primary) hypertension: Secondary | ICD-10-CM | POA: Diagnosis not present

## 2022-08-09 DIAGNOSIS — E039 Hypothyroidism, unspecified: Secondary | ICD-10-CM | POA: Diagnosis not present

## 2022-08-09 DIAGNOSIS — E7849 Other hyperlipidemia: Secondary | ICD-10-CM | POA: Diagnosis not present

## 2022-08-09 MED ORDER — ARNUITY ELLIPTA 100 MCG/ACT IN AEPB: 1.0000 | INHALATION_SPRAY | Freq: Every day | RESPIRATORY_TRACT | 6 refills | Status: DC

## 2022-08-09 NOTE — Telephone Encounter (Signed)
Arnuity has been sent to patients pharmacy. Patient is aware. NFN

## 2022-08-15 ENCOUNTER — Other Ambulatory Visit: Payer: Self-pay | Admitting: Nurse Practitioner

## 2022-08-25 ENCOUNTER — Ambulatory Visit (HOSPITAL_COMMUNITY)
Admission: RE | Admit: 2022-08-25 | Discharge: 2022-08-25 | Disposition: A | Discharge status: Home / Self Care | Payer: Medicare Other | Source: Ambulatory Visit | Attending: Physician Assistant | Admitting: Physician Assistant

## 2022-08-25 DIAGNOSIS — M25561 Pain in right knee: Secondary | ICD-10-CM | POA: Diagnosis not present

## 2022-08-25 DIAGNOSIS — G8929 Other chronic pain: Secondary | ICD-10-CM | POA: Diagnosis not present

## 2022-08-28 ENCOUNTER — Telehealth: Payer: Self-pay

## 2022-08-28 NOTE — Telephone Encounter (Signed)
Patient returned a call for Nichole Reyes, said she had a Scan of her knee this past Friday 08/25/22 and question best contact number is [phone]

## 2022-09-01 DIAGNOSIS — J8410 Pulmonary fibrosis, unspecified: Secondary | ICD-10-CM | POA: Diagnosis not present

## 2022-09-01 DIAGNOSIS — I10 Essential (primary) hypertension: Secondary | ICD-10-CM | POA: Diagnosis not present

## 2022-09-01 DIAGNOSIS — E7849 Other hyperlipidemia: Secondary | ICD-10-CM | POA: Diagnosis not present

## 2022-09-01 DIAGNOSIS — M542 Cervicalgia: Secondary | ICD-10-CM | POA: Diagnosis not present

## 2022-09-01 DIAGNOSIS — Z1389 Encounter for screening for other disorder: Secondary | ICD-10-CM | POA: Diagnosis not present

## 2022-09-01 DIAGNOSIS — R519 Headache, unspecified: Secondary | ICD-10-CM | POA: Diagnosis not present

## 2022-09-01 DIAGNOSIS — R739 Hyperglycemia, unspecified: Secondary | ICD-10-CM | POA: Diagnosis not present

## 2022-09-01 DIAGNOSIS — M797 Fibromyalgia: Secondary | ICD-10-CM | POA: Diagnosis not present

## 2022-09-04 NOTE — Progress Notes (Signed)
I called the patient to review MRI results of the right knee.  She has a complete tear of the anterior horn body of the lateral meniscus.  A small radial tear of the free edge of the lateral meniscus was also noted.  Discussed that I would recommend evaluation by her orthopedic surgeon.  She is under the care of Dr. Romeo Apple.  She will call to schedule an appointment Dr. Romeo Apple to further discuss the MRI results in detail as well as treatment options.  All questions were addressed.

## 2022-09-05 ENCOUNTER — Encounter: Payer: Self-pay | Admitting: Internal Medicine

## 2022-09-05 ENCOUNTER — Ambulatory Visit (INDEPENDENT_AMBULATORY_CARE_PROVIDER_SITE_OTHER): Payer: Medicare Other | Admitting: Internal Medicine

## 2022-09-05 VITALS — BP 120/76 | HR 79 | Temp 98.2°F | Ht 65.0 in | Wt 167.8 lb

## 2022-09-05 DIAGNOSIS — R0609 Other forms of dyspnea: Secondary | ICD-10-CM

## 2022-09-05 DIAGNOSIS — T50905A Adverse effect of unspecified drugs, medicaments and biological substances, initial encounter: Secondary | ICD-10-CM

## 2022-09-05 DIAGNOSIS — R634 Abnormal weight loss: Secondary | ICD-10-CM

## 2022-09-05 DIAGNOSIS — J84112 Idiopathic pulmonary fibrosis: Secondary | ICD-10-CM

## 2022-09-05 DIAGNOSIS — Z86711 Personal history of pulmonary embolism: Secondary | ICD-10-CM | POA: Diagnosis not present

## 2022-09-05 DIAGNOSIS — K219 Gastro-esophageal reflux disease without esophagitis: Secondary | ICD-10-CM

## 2022-09-05 DIAGNOSIS — Z5181 Encounter for therapeutic drug level monitoring: Secondary | ICD-10-CM

## 2022-09-05 LAB — HEPATIC FUNCTION PANEL
ALT: 16 U/L (ref 0–35)
AST: 19 U/L (ref 0–37)
Albumin: 4.1 g/dL (ref 3.5–5.2)
Alkaline Phosphatase: 83 U/L (ref 39–117)
Bilirubin, Direct: 0.1 mg/dL (ref 0.0–0.3)
Total Bilirubin: 0.2 mg/dL (ref 0.2–1.2)
Total Protein: 7.5 g/dL (ref 6.0–8.3)

## 2022-09-05 LAB — D-DIMER, QUANTITATIVE: D-Dimer, Quant: 0.26 mcg/mL FEU (ref <0.50)

## 2022-09-05 NOTE — Progress Notes (Signed)
OV 11/19/2018: Nichole Reyes is a 72 year old woman referred for evaluation of dyspnea for the past several months.  In the same time interval she is also noticed fatigue.  She has previously undergone a negative cardiac evaluation and an outside CT scan of her chest.  Per report, the CT demonstrates fibrotic changes. She is a never- smoker.  She has previously been evaluated by rheumatology, Dr. Estanislado Pandy, for fibromyalgia and osteoarthritis.  She began to notice dyspnea on exertion when exercising in February 2020.  She had previously been able to walk 35 to 40 minutes, but decreased to about 20 minutes at a time due to dyspnea.  She was hospitalized in [place] in June 2020 for dyspnea. At that time she was COVID negative.  She was discharged on albuterol, which she has been using up to 4 times a day, which improves her symptoms.  There is no family history of fibrotic lung disease; multiple family members had COPD.  Her maternal grandmother had rheumatoid arthritis, but otherwise there is no family history of rheumatologic disease.  In addition to dyspnea on exertion with occasional dyspnea at rest she endorses cough and sputum production.  She denies wheezing, postnasal drip, coughing or choking when eating, Raynaud's, muscle weakness, rashes, dry eyes or mouth, joint swelling, prolonged morning stiffness.  She has occasional hand stiffness throughout the day, not associated with rest.  She has a hiatal hernia with occasional GERD with symptoms less than once a week, associated with certain foods.  She is a never smoker or vaper.  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  She has never worked in [employer] or shipyards.  She does not have any dusty hobbies.  She has a Programmer, systems, but has never had pet birds.  She does not regularly use hot tubs.  She has never had exposure to medical radiation, methotrexate,  Macrobid, amiodarone, bleomycin.  Her only new medication was Zetia last month for hypertriglyceridemia.  Otherwise she has not had medication changes in greater than a year.  Last month she has had 6 episodes of epistaxis, which has improved with use of intranasal steroids prescribed by her PCP.     OV 05/06/2019  Subjective:  Patient ID: Nichole Reyes, female , DOB: Sep 07, 1950 , age 72 y.o. , MRN: [id number] , ADDRESS: [place] [address]   05/06/2019 -   Chief Complaint  Patient presents with   Follow-up    Pt states she has both good days and bad days with breathing. Pt states she will occ have to use her rescue inhaler at least 1-2 times with activities.     HPI Nichole Reyes 72 y.o. -referred by Dr. 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  She was first seen by Dr. Carlis Abbott in September 2020 noted above.  She followed up in December 2020 and had pulmonary function test.  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  Due to insurance delays she has not started the nintedanib yet.  She is wondering about this.  She has specific questions about taking Covid vaccine.  She is also worried about her significant cough.  Review shows she is on ACE inhibitor.  She has a tickle in the throat.  She has acid reflux and is on Prilosec.  She has a history of  hiatal hernia.  She is taken a decision on starting nintedanib but she is interested in hearing about the other antifibrotic pirfenidone because of insurance delay she has not started on anything as yet.   [facility] Comprehensive ILD Questionnaire  Symptoms:  -Insidious onset of shortness of breath 7 months ago.  Since it started it has been the same.  She also has episodic amount of dyspnea.  The severity classification is below.  She does not know when her cough started but she does have a cough associated with clearing of the throat.  It is  moderate in intensity.  She does cough at night.  She does bring up phlegm.  The phlegm is usually white but sometimes yellow.  She does feel a tickle in her throat but does not wake up in the middle of the night because of the cough.  There is no wheezing.  Cough is associated with ACE inhibitor intake.    Past Medical History : Positive for fibromyalgia, hiatal hernia, sleep apnea both for the last few years.  There is also borderline diabetes..  And hyperlipidemia   she denies any asthma or COPD or heart failure rheumatoid arthritis or scleroderma or lupus.  Denies any polymyositis.  Denies Sjogren's.  Denies HIV.  Denies pulmonary hypertension.  Denies thyroid disease or stroke.  Denies seizures or mononucleosis or hepatitis or tuberculosis.  Denies kidney disease.  Denies pneumonia.  Denies history of blood clots or heart disease or pleurisy.  Autoimmune serology sept 2020  - normal Results for Nichole, Reyes (MRN [id number]) as of 05/06/2019 12:01  Ref. Range 11/19/2018 14:58  SEE BELOW Unknown Comment  Anti Nuclear Antibody (ANA) Latest Ref Range: Negative  Negative  Anti JO-1 Latest Ref Range: 0.0 - 0.9 AI <0.2  CENTROMERE AB SCREEN Latest Ref Range: 0.0 - 0.9 AI <0.2  dsDNA Ab Latest Ref Range: 0 - 9 IU/mL <1  ENA RNP Ab Latest Ref Range: 0.0 - 0.9 AI <0.2  ENA SSA (RO) Ab Latest Ref Range: 0.0 - 0.9 AI <0.2  ENA SSB (LA) Ab Latest Ref Range: 0.0 - 0.9 AI <0.2  ENA SM Ab Ser-aCnc Latest Ref Range: 0.0 - 0.9 AI <0.2  Chromatin Ab SerPl-aCnc Latest Ref Range: 0.0 - 0.9 AI <0.2  Anti-Jo-1 Ab (RDL) Latest Ref Range: <20 Units <20  Anti-PL-7 Ab (RDL) Unknown CANCELED  Anti-PL-12 Ab (RDL) Unknown CANCELED  Anti-EJ Ab (RDL) Unknown CANCELED  Anti-OJ Ab (RDL) Unknown CANCELED  Anti-SRP Ab (RDL) Unknown CANCELED  Anti-Mi-2 Ab (RDL) Unknown CANCELED  Anti-TIF-1gamma Ab (RDL) Latest Units: Units CANCELED  Anti-MDA-5 Ab (CADM-140)(RDL) Latest Units: Units CANCELED  Anti-NXP-2 (P140) Ab  (RDL) Latest Units: Units CANCELED  Anti-SAE1 Ab, IgG (RDL) Latest Units: Units CANCELED  Anti-PM/Scl-100 Ab (RDL) Latest Ref Range: <20 Units <20  Anti-Ku Ab (RDL) Unknown CANCELED  Anti-SS-A 52kD Ab, IgG (RDL) Latest Ref Range: <20 Units <20  Anti-U1 RNP Ab (RDL) Latest Units: Units CANCELED    ROS: She has fatigue for the last several years and also arthralgia.  She attributes this to fibromyalgia.  She also has pain in her fingers which she attributes due to fibromyalgia.  She has heartburn from hiatal hernia she says.  She also snores because of her sleep apnea and she has a rash related to eczema.  But otherwise negative.   FAMILY HISTORY of LUNG DISEASE: She thinks her dad might have had COPD and her grandfather.  However nobody with pulmonary fibrosis or  other lung diseases.   EXPOSURE HISTORY: Denies ever having smoked cigarettes although she grew up in a home with her dad smoked.  Therefore positive for passive smoking as a child.  Denies cigar use.  Denies marijuana use denies cocaine use denies IV drug use.  Denies vaping.   HOME and HOBBY DETAILS : [place]  She is lived in this home for 22 years.  The home is 70 years old.  She has noticed mildew periodically in the shower curtain and in the bathroom.  She does use a CPAP mask but there is no water circuit mold or mildew in it.  She does do gardening with the flower garden she does use a steam iron but there is no mold or mildew in it.  There is no pet birds.  No feather pillow or duvet.  No pet gerbils.  No mold in the [facility] duct.  Does not play any wind instruments.  Does not use a humidifier.  Does not have a Jacuzzi in the house.   OCCUPATIONAL HISTORY (122 questions) : Essentially negative.  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   PULMONARY TOXICITY HISTORY (27 items): Negative except prednisone use    Results for MELENIE, MINNIEAR (MRN [id number]) as of 05/06/2019 12:01  Ref. Range 11/19/2018 but ? Done 02/28/2019 14:57  FVC-Pre Latest Units: L 2.29  FVC-%Pred-Pre Latest Units: % 70   Results for CAILEN, TEXEIRA (MRN [id number]) as of 05/06/2019 12:01  Ref. Range 11/19/2018 but ? Done 02/28/2019 14:57  DLCO unc Latest Units: ml/min/mmHg 15.75  DLCO unc % pred Latest Units: % 77    She had a high-resolution CT chest September 24, 2018 at [facility].  This was read by our radiologist Dr Eddie Candle..  I do not have the image to visualized but I trust this report.  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  In addition is described a 1.4 cm right upper lobe posterior segment inferior part nodule that is subpleural and also 8 mm subpleural right middle lobe nodule.  He says these findings are unchanged compared to 2008 and are benign.  OV 06/03/2019 -telephone visit.  Patient identified with 2 person identifier.  Risks, benefits and limitations of telephone visit explained.  Subjective:  Patient ID: Nichole Reyes, female , DOB: November 07, 1950 , age 60 y.o. , MRN: [id number] , ADDRESS: [address] [place] [address]   Clinical IPF diagnosis given dec 2020 and reiterated Mid Feb 2021:  based on your age greater than 65, clubbing on exam, CT scan with high probability for this condition, negative history on your questionnaire and negative blood test on your serology exams   06/03/2019 -  Fu IPF - focus on esbriet monitoring and any other questions she might have   HPI Nichole Reyes 72 y.o. - on Esbriet now. Started it 05/22/2019 Thursday. Currently on 2 pills tid. Taking it with food. Spacing 5-6h between dosing. No side effects at this point.  From a dyspnea standpoint she is stable.  She had questions about being able to do gardening.  We discussed the fact this could cause mold  exposure and organic dust exposure that could irritate her IPF.  She says she can do it with masking.  Gardening gives her purpose so I supported it.  She also plan [place] last week and was quite short of breath and took an inhaler.  I have suggested that she use a pulse ox meter and monitor her pulse ox and call us if her pulse ox below 88% so we can order portable oxygen [obviously will have to do stress testing submaximal 6-minute walk test] otherwise doing well.    OV 12/16/2019   Subjective:  Patient ID: Nichole Reyes, female , DOB: 1950-08-26, age 72 y.o. years. , MRN: [id number],  ADDRESS: [place] [address] PCP  Rory Percy, MD Providers : Treatment Team:  Attending Provider: Brand Males, MD   Chief Complaint  Patient presents with   Follow-up    Tired all the time. redness/itching to bilateral nick and insides of  arms.  stress/heat?     Clinical IPF diagnosis given dec 2020 and reiterated Mid Feb 2021:  based on your age greater than 65, clubbing on exam, CT scan with high probability for this condition, negative history on your questionnaire and negative blood test on your serology exams . - on Esbriet now. Started it 05/22/2019   GERD/Hiatal Hernia  HPI Nichole Reyes 72 y.o. -returns for follow-up.  Last seen in early part of 2021.  After that she see nurse practitioner 2 times once or at least telephone visit.  She tried it in pulmonary rehabilitation for IPF but she had logistical issues.  Early morning she has to get her husband ready for work.  Late in the afternoon she has to pick up her granddaughter.  She also has knee issues from a fibromyalgia.  Therefore is not able to do pulmonary rehabilitation.  She is very compliant with her pirfenidone.  She feels she is tolerating it fine but she did admit to low appetite and also has a 7 pound weight loss.  She is also fatigued.  She thinks the fatigue is present for the last few months.  She  thinks it might be related to her daughter's wedding but then the fatigue has been there for the last few months while the daughter's wedding happened a few weeks ago.  She is not on oxygen.  Walking desaturation test is stable.  Most recent pulmonary function test is stable.  Symptom score other than fatigue is also stable.  She continues PPI for her acid reflux.      OV 05/06/2020  Subjective:  Patient ID: Nichole Reyes, female , DOB: Mar 15, 1951 , age 72 y.o. , MRN: [id number] , ADDRESS: [place] [address] PCP Rory Percy, MD Patient Care Team: Rory Percy, MD as PCP - General (Family Medicine) Herminio Commons, MD (Inactive) as PCP - Cardiology (Cardiology)  This Provider for this visit: Treatment Team:  Attending Provider: Brand Males, MD    05/06/2020 -   Chief Complaint  Patient presents with   Follow-up    SOB with exertion    Clinical IPF diagnosis given dec 2020 and reiterated Mid Feb 2021:  based on your age greater than 65, clubbing on exam, CT scan with high probability for this condition, negative history on your questionnaire and negative blood test on your serology exams . - on Esbriet now. Started it 05/22/2019   GERD/Hiatal Hernia  Last CT scan of the chest July 2020 at [facility] with probable UIP Last PFT June 2021  HPI Nichole Reyes 72 y.o. -presents for follow-up.  Last seen in  September 2021.  Since then she continues to deal with her right knee issue.  She is seen Dr. Bo Merino for this.  Apparently a knee MRI is pending.  She is continuing to lose weight.  She has lost 10 pounds in the last 1 year.  This correlates with starting pirfenidone.  Technically she is still overweight.  She is also reporting associated fatigue and metallic taste.  A few months ago she made a call and we told her to hold the pirfenidone.  After that the metallic taste largely resolved but it does come back although to a much lesser severity.   At this point in time it does not bother her too much so she think she will continue to take pirfenidone.  From a respiratory standpoint she feels stable although she would say that in the last 1 year she feels the disease might be slowly progressive.  Her symptom score shows slight worsening especially with shortness of breath and weight loss but same fatigue.  Some nausea with pirfenidone.  He was supposed to pulmonary function testing today but for some reason has not happened.  Last PFT was June 2021.  Last CT scan of the chest was July 2020 I do not have echocardiogram in our system.  She did have a low risk nuclear medicine stress test with 59% in June 2020   [place] 07/13/2020  Subjective:  Patient ID: Nichole Reyes, female , DOB: Sep 25, 1950 , age 30 y.o. , MRN: [id number] , ADDRESS: [place] [address] PCP Lanelle Bal, PA-C Patient Care Team: Lanelle Bal, PA-C as PCP - General (Family Medicine) Herminio Commons, MD (Inactive) as PCP - Cardiology (Cardiology)  This Provider for this visit: Treatment Team:  Attending Provider: Brand Males, MD    07/13/2020 -   Chief Complaint  Patient presents with   Follow-up    No concerns, here to get PFT results.    Clinical IPF diagnosis given dec 2020 and reiterated Mid Feb 2021:  based on your age greater than 65, clubbing on exam, CT scan with high probability for this condition, negative history on your questionnaire and negative blood test on serology exams . - Oon Esbriet since 05/22/2019   GERD/Hiatal Hernia  Last PFT April 2022 Last CT scan of the chest March 2022   HPI Nichole Reyes 72 y.o. -returns for follow-up to discuss her test results.  She definitely has more shortness of breath now than she did in September 2021.  She continues to lose weight.  But her BMI is still 27.  No metallic taste no nausea vomiting or diarrhea.  No chest pains.  Her pulmonary function test shows slight decline in DLCO but  FVC is stable.  Walking desaturation to stable.  High-resolution CT chest shows stability in ILD compared to 2020.  Therefore overall she is probably stable.  There is evidence of coronary artery calcification and also aortic valve calcifications on the CT.  Her echo showed mild diastolic dysfunction that could be contributing to the dyspnea.  There is also mild aortic valve sclerosis.  There is no chest pain with exertion.  Her last cardiologist in [place] is no longer in the health system.  She is not seen a cardiologist in 7 years.  Does not recollect when her last stress test was.   WE discussed   1. Scientific Purpose  Clinical research is designed to produce generalizable knowledge and to answer questions about the safety and efficacy  of intervention(s) under study in order to determine whether or not they may be useful for the care of future patients.  2. Study Procedures  Participation in a trial may involve procedures or tests, in addition to the intervention(s) under study, that are intended only or primarily to generate scientific knowledge and that are otherwise not necessary for patient care.   3. Uncertainty  For intervention(s) under study in clinical research, there often is less knowledge and more uncertainty about the risks and benefits to a population of trial participants than there is when a doctor offers a patient standard interventions.   4. Adherence to Protocol  Administration of the intervention(s) under study is typically based on a strict protocol with defined dose, scheduling, and use or avoidance of concurrent medications, compared to administration of standard interventions.  5. Clinician as Investigator  Clinicians who are in health care settings provide treatment; in a clinical trial setting, they are also investigating safety and efficacy of an intervention. In otherwise your doctor or nurse practitioner can be wearing 2 hats - one as care giver another as  Company secretary  6. Patient as Visual merchandiser Subject  Patients participating in research trials are research subjects or volunteers. In other words participating in research is 100% voluntary and at one's own free weill. The decision to participate or not participate will NOT affect patient care and the doctor-patient relationship in any way   ECHO 06/29/20  IMPRESSIONS     1. Left ventricular ejection fraction, by estimation, is 60 to 65%. The  left ventricle has normal function. The left ventricle has no regional  wall motion abnormalities. There is mild left ventricular hypertrophy.  Left ventricular diastolic parameters  are consistent with Grade I diastolic dysfunction (impaired relaxation).   2. Right ventricular systolic function is normal. The right ventricular  size is normal. There is normal pulmonary artery systolic pressure. The  estimated right ventricular systolic pressure is 41.9 mmHg.   3. The mitral valve is grossly normal. Trivial mitral valve  regurgitation.   4. The aortic valve is tricuspid. Aortic valve regurgitation is not  visualized. Mild aortic valve sclerosis is present, with no evidence of  aortic valve stenosis.   5. The inferior vena cava is normal in size with greater than 50%  respiratory variability, suggesting right atrial pressure of 3 mmHg.   Comparison(s): No prior Echocardiogram.    CT Chest data march 22022  Narrative & Impression  CLINICAL DATA:  72 year old female with history of chronic dyspnea. Fibrotic changes noted on prior CT examination. Follow-up study.   EXAM: CT CHEST WITHOUT CONTRAST   TECHNIQUE: Multidetector CT imaging of the chest was performed following the standard protocol without intravenous contrast. High resolution imaging of the lungs, as well as inspiratory and expiratory imaging, was performed.   COMPARISON:  No priors available for comparison.   FINDINGS: Cardiovascular: Heart size is normal.  There is no significant pericardial fluid, thickening or pericardial calcification. There is aortic atherosclerosis, as well as atherosclerosis of the great vessels of the mediastinum and the coronary arteries, including calcified atherosclerotic plaque in the left main, left anterior descending, left circumflex and right coronary arteries. Calcifications of the aortic valve.   Mediastinum/Nodes: No pathologically enlarged mediastinal or hilar lymph nodes. Please note that accurate exclusion of hilar adenopathy is limited on noncontrast CT scans. Esophagus is unremarkable in appearance. No axillary lymphadenopathy.   Lungs/Pleura: High-resolution images demonstrate widespread areas of peripheral predominant ground-glass attenuation, septal thickening,  mild cylindrical bronchiectasis and peripheral bronchiolectasis. Findings have a mild craniocaudal gradient. No definitive honeycombing is identified. Inspiratory and expiratory imaging is unremarkable. No acute consolidative airspace disease. No pleural effusions. In the periphery of the right middle lobe (axial image 89 of series 6) there is a 7 x 5 mm (mean diameter of 6 mm) subpleural pulmonary nodule (stable compared to prior study 09/24/2018). In the superior segment of the right lower lobe (axial image 73 of series 6) there is a 2.3 x 0.8 cm elongated nodular area of architectural distortion which is intimately associated with an accessory fissure, which appears slightly larger on axial images, but is completely stable in size and appearance when viewed on sagittal reconstructions.   Upper Abdomen: Aortic atherosclerosis.   Musculoskeletal: There are no aggressive appearing lytic or blastic lesions noted in the visualized portions of the skeleton.   IMPRESSION: 1. 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 Repeat high-resolution chest CT is recommended in 12 months to assess for temporal changes in the appearance of the lung parenchyma. 2. Pulmonary nodules in the right lung are stable compared to the prior study from 2020, likely benign. Attention at time of repeat high-resolution chest CT is recommended to ensure continued stability. 3. Aortic atherosclerosis, in addition to left main and 3 vessel coronary artery disease. Please note that although the presence of coronary artery calcium documents the presence of coronary artery disease, the severity of this disease and any potential stenosis cannot be assessed on this non-gated CT examination. Assessment for potential risk factor modification, dietary therapy or pharmacologic therapy may be warranted, if clinically indicated. 4. There are calcifications of the aortic valve. Echocardiographic correlation for evaluation of potential valvular dysfunction may be warranted if clinically indicated.   Aortic Atherosclerosis (ICD10-I70.0).   Electronically Signed: By: Vinnie Langton M.D. On: 06/01/2020 09:51  ADDENDUM REPORT: 06/21/2020 14:33   ADDENDUM: It was brought to my attention that there is a prior chest CT from 09/24/2018. After comparison between the 05/31/2020 exam and the 09/24/2018 exam, there is no evidence of progression. Assessment remains unchanged. Specifically, based on the spectrum of findings this continues to be categorized as probable usual interstitial pneumonia (UIP) per current ATS guidelines.     Electronically Signed   By: Vinnie Langton M.D.   On: 06/21/2020 14:33        OV 01/24/2021  Subjective:  Patient ID: Nichole Reyes, female , DOB: [birth year] , age 23 y.o. , MRN: [id number] , ADDRESS: [address] [place] [address] PCP Lanelle Bal, PA-C Patient Care Team: Lanelle Bal, PA-C as PCP - General (Family Medicine) Satira Sark, MD as PCP - Cardiology  (Cardiology)  This Provider for this visit: Treatment Team:  Attending Provider: Brand Males, MD    01/24/2021 -   Chief Complaint  Patient presents with   Follow-up    F/U after PFT. States her breathing has been stable since last visit with SOB with exertion.        HPI Nichole Reyes 72 y.o. -returns for follow-up.  In this visit she has had pulmonary function test.  It shows a 7% decline in FVC and DLCO in 2 years.  Is roughly 3.5 %/year.  She is actually having more shortness of breath.  She did see cardiology and had a normal Myoview stress test.  She has been reassured  from a cardiology standpoint.  But has dyspnea score is worse.  She is unable to do pulmonary rehabilitation because of issues with family and also because she has osteoarthritis of her knees.  She is seeing rheumatology for this.  She says she has never seen orthopedic for this.  Did indicate to her to talk to rheumatology and see if at this time to do a knee replacement and if she needs an orthopedic referral.  Did indicate that at some point the ILD could get worse and she would be quite dyspneic and unable to mobilize.  But at this point from a safety standpoint would be a good point in time to do a knee replacement if the knees are really bad.  She understood this.  We did discuss worsening.  She is on maximal standard of care.  We did discuss clinical trials as a care option she is interested.  She has been losing weight with pirfenidone there is more weight loss.  Her BMI is 27 and technically she is still overweight.  We decided to continue with pirfenidone.  She will have a liver function test today.  She will have a high-dose flu shot today.     OV 08/18/2021  Subjective:  Patient ID: Nichole Reyes, female , DOB: [birth year] , age 40 y.o. , MRN: [id number] , ADDRESS: [address] [place] [address] PCP Lanelle Bal, PA-C Patient Care Team: Lanelle Bal, PA-C as PCP - General (Family  Medicine) Satira Sark, MD as PCP - Cardiology (Cardiology)  This Provider for this visit: Treatment Team:  Attending Provider: Brand Males, MD    08/18/2021 -   Chief Complaint  Patient presents with   Follow-up    PFT performed today.  Pt states she has been doing okay since last visit. States she gets nauseous every time she takes her Esbriet and also will become SOB with exertion.    Hyperlipidemia  HPI Nichole Reyes 72 y.o. -returns for follow-up.  At this visit she continues to be stable in terms of previous weight loss but also dyspnea on exertion although this time she tells me that the dyspnea is bothering her though the symptom score is stable.  Her pulmonary function test shows overall slow decline over a long period of time but most recently the 2 studies are stable.  She appears to get dyspneic while walking the dog.  She is looking for measures to help her.  Did indicate to her about deconditioning.  She has previously been to pulmonary rehabilitation.  Her schedule a does not allow her to go to rehab.  We talked about doing a 6-minute walk test to look for exertional desaturation and seeing if she would qualify for oxygen.  She is willing to do this.    The more important thing is that she is having significant nausea.  In fact the nausea is worse now.  All of a sudden for the last few months.  She started taking over-the-counter Emetrol a few days ago and this seems to help.  She told me that she is spacing her pirfenidone only every 4 hours.  This is what she thought she needed to do.  I did indicate to her that she needs to space it out at least 5 to 6 hours apart.  We will see if this helps the nausea.  She does drink enough fluids.  We went over her medication list.  This been significant changes to her anticholesterol medications.  Since earlier this year she has been on Repatha.  Then a few days ago or a few weeks ago her Zetia got changed back to fenofibrate.   The timing of this medication does not fit in with nausea.  Nevertheless she says she has hyperlipidemia for over 30 years.  Managed with primary care.  She says it is very difficult to control.  Never been seen by lipid specialist.  Willing to see Dr. Kenneth Italy Hilty.   Of note she has been interested in clinical trials but given the current nausea she wants to hold off at this point  CT Chest data   OV 02/28/2022  Subjective:  Patient ID: Nichole Reyes, female , DOB: 15-Sep-1950 , age 63 y.o. , MRN: [id number] , ADDRESS: [address] [place] [address] PCP Lianne Moris, PA-C Patient Care Team: Lianne Moris, PA-C as PCP - General (Family Medicine) Jonelle Sidle, MD as PCP - Cardiology (Cardiology)  This Provider for this visit: Treatment Team:  Attending Provider: Kalman Shan, MD    02/28/2022 -   Chief Complaint  Patient presents with   Follow-up    PFT performed today. Pt states she has been doing okay since last visit.     HPI Nichole Reyes 73 y.o. -returns for follow-up.  Since last seeing me in summer 2023 she is stable.  She tells me that Thanksgiving 2023 she did have outpatient COVID-19.  Treated with Paxlovid.  4 days into this she was extremely tired and then suddenly passed out.  Since then she has been feeling well.  She felt it was because of dehydration and polypharmacy.  However from a respiratory standpoint and pirfenidone tolerance standpoint she is doing really well.  No changes in symptoms.  Pulmonary function test is actually stable since the last 1 year [results below].  She had a normal echocardiogram in the summer 2023.  Results reviewed.  Her acid reflux under control.  In the past we have discussed clinical trials as a care option.  Today discussed the potential for osteoporosis risk with pulmonary fibrosis patients.  This study going on by [employer].  It involves getting a DEXA bone scan and she is willing to participate if she  qualifies.         OV 06/05/2022  Subjective:  Patient ID: Nichole Reyes, female , DOB: 1950-10-30 , age 72 y.o. , MRN: [id number] , ADDRESS: [address] [place] [address] PCP Lianne Moris, PA-C Patient Care Team: Lianne Moris, PA-C as PCP - General (Family Medicine) Jonelle Sidle, MD as PCP - Cardiology (Cardiology) Doreatha Massed, MD as Medical Oncologist (Hematology)  This Provider for this visit: Treatment Team:  Attending Provider: Kalman Shan, MD  06/05/2022 -   Chief Complaint  Patient presents with   Follow-up    Pt is here for follow up for IPF. Pt states she is doing well and still taking Esbriet. No side effects noted so far while on medication      HPI Nichole Reyes 72 y.o. -returns for follow-up.  Presents with her husband.  I am meeting the husband for the first time.  He did get dependent history forming the fact that she does get short of breath when she does things around the house and does some minimal yard work.  He does think it slightly progressive over the last 6-12 months.  Patient herself states that after the COVID-19 and Thanksgiving she injured her left shoulder and then in  the aftermath of that after Christmas 2023 she did develop a right-sided DVT and a pulmonary embolism with RV/LV ratio of 1.47 although BNP was normal at that time.  She was discharged slightly after New Year's 2024.  2D echocardiogram done towards the end of January 2024 does not show any right heart strain and it is baseline with grade 1 diastolic dysfunction.  She believes she is slightly more dyspneic than in the past after the pulmonary embolism.  However her shortness of breath scores remained stable and walking desaturation test is stable.  The pulmonary function test though shows a decline in DLCO subsequently [see below].  Her walking desaturation test is stable today.  Lab review also shows a slight amount of anemia in the hospital.  She had  high-resolution CT chest and according to the radiologist pulm fibrosis stable x 2 years.  She is tolerating the pirfenidone well.  However she is continue to lose weight.  She is still overweight technically.    HRCT 05/24/22  Narrative & Impression  CLINICAL DATA:  72 year old female with history of interstitial lung disease.   EXAM: CT CHEST WITHOUT CONTRAST   TECHNIQUE: Multidetector CT imaging of the chest was performed following the standard protocol without intravenous contrast. High resolution imaging of the lungs, as well as inspiratory and expiratory imaging, was performed.   RADIATION DOSE REDUCTION: This exam was performed according to the departmental dose-optimization program which includes automated exposure control, adjustment of the mA and/or kV according to patient size and/or use of iterative reconstruction technique.   COMPARISON:  High-resolution chest CT 05/31/2020. Chest CTA 03/17/2022.   FINDINGS: Cardiovascular: Heart size is mildly enlarged. There is no significant pericardial fluid, thickening or pericardial calcification. There is aortic atherosclerosis, as well as atherosclerosis of the great vessels of the mediastinum and the coronary arteries, including calcified atherosclerotic plaque in the left main, left anterior descending, left circumflex and right coronary arteries. Calcifications of the aortic valve.   Mediastinum/Nodes: No pathologically enlarged mediastinal or hilar lymph nodes. Please note that accurate exclusion of hilar adenopathy is limited on noncontrast CT scans. Esophagus is unremarkable in appearance. No axillary lymphadenopathy.   Lungs/Pleura: High-resolution images again demonstrate widespread areas of peripheral predominant ground-glass attenuation, septal thickening, subpleural reticulation, thickening of the peribronchovascular interstitium, cylindrical bronchiectasis and peripheral bronchiolectasis scattered  throughout the lungs bilaterally. No definitive honeycombing confidently identified. No significant progression of disease compared to the prior study. Findings have a mild craniocaudal gradient. Inspiratory and expiratory imaging is unremarkable. No acute consolidative airspace disease. No pleural effusions. A few scattered tiny pulmonary nodules are noted, stable compared to the prior high-resolution chest CT 05/31/2020, largest of which measures only 3 mm in the medial right upper lobe (axial image 31 of series 5), considered definitively benign requiring no imaging follow-up. Small calcified granuloma also noted in the medial right upper lobe. No other larger more suspicious appearing pulmonary nodules or masses are noted.   Upper Abdomen: Aortic atherosclerosis.   Musculoskeletal: There are no aggressive appearing lytic or blastic lesions noted in the visualized portions of the skeleton.   IMPRESSION: 1. The appearance of the lungs remains compatible with interstitial lung disease, once again categorized as probable usual interstitial pneumonia (UIP) per current ATS guidelines. Given the lack of progression compared to prior examinations, the possibility of an alternative etiology such as fibrotic phase nonspecific interstitial pneumonia (NSIP) should be considered. 2. Mild cardiomegaly. 3. Aortic atherosclerosis, in addition to left main and three-vessel coronary artery  disease. Please note that although the presence of coronary artery calcium documents the presence of coronary artery disease, the severity of this disease and any potential stenosis cannot be assessed on this non-gated CT examination. Assessment for potential risk factor modification, dietary therapy or pharmacologic therapy may be warranted, if clinically indicated. 4. There are calcifications of the aortic valve. Echocardiographic correlation for evaluation of potential valvular dysfunction may be warranted  if clinically indicated.   Aortic Atherosclerosis (ICD10-I70.0).     Electronically Signed   By: Trudie Reed M.D.   On: 05/24/2022 13:25     OV 09/05/2022  Subjective:  Patient ID: Nichole Reyes, female , DOB: [birth year] , age 55 y.o. , MRN: [id number] , ADDRESS: [address] [place] [address] PCP Lianne Moris, PA-C Patient Care Team: Lianne Moris, PA-C as PCP - General (Family Medicine) Jonelle Sidle, MD as PCP - Cardiology (Cardiology) Doreatha Massed, MD as Medical Oncologist (Hematology)  This Provider for this visit: Treatment Team:  Attending Provider: Kalman Shan, MD    Clinical IPF diagnosis given dec 2020 and reiterated Mid Feb 2021:  based on your age greater than 65, clubbing on exam, CT scan with high probability for this condition, negative history on your questionnaire and negative blood test on serology exams .  - Oon Esbriet since 05/22/2019 - Last PFT  April  2024 - Last CT scan of the chest March 2024   GERD/Hiatal Hernia    DJD/OA knee  Drug induced weihgt loss -due to pirfenidone  Grade 1 diastolic dysfunction with normal pulmonary artery pressure   History of COVID-19 Thanksgiving 2023 treated with outpatient Paxlovid  -Fatigue related syncope along with polypharmacy   PE 03/17/22 - 03/20/22 admission with Rt DVT - Rx Eliquis RV/LV ratio 1.47, BMP{ 60  - ECHO Apr 13, 2022 - No RV strain and normal PAP. Gr 1 ddx  - Didimer normal FEb 2024   09/05/2022 -   Chief Complaint  Patient presents with   Follow-up    Doing well.     HPI Nichole Reyes 72 y.o. -presents with her husband Kathlene November for follow-up.  Kathlene November is now retired.  He was the Agricultural consultant for a shoe company called [employer].  He is now accompanying her with visits.  She reports overall being stable.  Dyspnea score is stable.  She is actually gained some weight because of increased sugar intake.  She continues with the pirfenidone.  It does give her  some fatigue and nausea but she says it is well-controlled.  Was supposed to get pulmonary function test this week but unfortunately was done shortly after the last visit.  Disease appears stable between March and April 2024.  Currently the symptom score is stable.  The excess hypoxemia test results are stable.  She did get dyspneic doing exercise test.  We talked about pulm rehabilitation.  She did not attended previously because of scheduling convenience.  Decided to do another referral and referred to the rehab center at [facility].  She had a pulm embolism and right-sided DVT in December 2023: Will check a D-dimer now to decide on length of follow-up.  She is completed 6 months of treatment.  Suspect she will need to do 1 full year at full dose  Discussed clinical trials as a care option again.  She is interested.  Her husband Kathlene November had some questions.  Gave her consent form for study involving [employer].  We gave her the  date on this.  The studies called beacon.  At some point we will prescreener.  SYMPTOM SCALE - ILD 05/06/2019 179# 12/16/2019 172# - esbriet + 05/06/2020 169# - esbriet 07/13/2020 167# - on esbreiet 01/24/2021 164# 08/18/2021 166# - esbriet 02/28/2022 162# 06/05/2022 160# 09/05/2022 167#  O2 use ra          Shortness of Breath 0 -> 5 scale with 5 being worst (score 6 If unable to do)          At rest 4 1 0 2 3 1 1 3 3   Simple tasks - showers, clothes change, eating, shaving 4 1 2 2 4 3 4 5 4   Household (dishes, doing bed, laundry) 5 5 4 4 5 5 5 4 5   Shopping 5 3 4 3 4 5 5 5 4   Walking level at own pace 5 3 4 3 4 5 5 3 4   Walking up Stairs 5 5 5 4 5 5 5 5 5   Total (30-36) Dyspnea Score 28 15 19 18 25 24 25 25 25   How bad is your cough? 2.5 1 0 0 0 2 3 2 3   How bad is your fatigue  yes 4 3 4 3 4 4 3 3   How bad is nausea x 0 2 0 2 4 1 1 3   How bad is vomiting?  x 0 0 0 0 0 0 0 0  How bad is diarrhea? x 0 0 0 0 0 0 0 0  How bad is anxiety? x 00 1 2 3 2 4 2 3   How  bad is depression x 0 3 2 2 3 4 1 3       Simple office walk 185 feet x  3 laps goal with forehead probe 05/06/2019  12/16/2019  05/06/2020  07/13/2020  01/24/2021  08/18/2021  06/05/2022  09/05/2022   O2 used ra ra ra ra ra ra  ra  Number laps completed 3 3 3  goal - stopped at 2 due to dyspnea 3 and did all 3 3 3   Sit ad stand x 10  Comments about pace avg mod Slow pace avg      Resting Pulse Ox/HR 98% and 90/min 97% and 77/min 98% and 90/min 96% and 94 98% and 90 100% and 84 97% and HR 82 97% and HR 81  Final Pulse Ox/HR 96% and 100/min 97% and 91/mn 94% and 92 at 2nd lap 93% and 103/min 96% and 99 95% and 96 95% and HR 94 95% and HR 83  Desaturated </= 88% no no no no no     Desaturated <= 3% points no no Yes,, 4 pint Yes 3 points no     Got Tachycardic >/= 90/min yes yes yes yes      Symptoms at end of test Mod dyspnea, knee pain Dyspnea and fatigue strting at 1st lap and increased by 3rd stoppe early and did drop 3 points Dyspnea at end  Dyspnea mild to mod, light headed No dyspnea Level 5 dyspnea  Miscellaneous comments x             PFT     Latest Ref Rng & Units 07/18/2022    3:04 PM 06/05/2022   11:46 AM 11/29/2021   11:32 AM 08/18/2021    9:16 AM 01/24/2021   10:55 AM 07/13/2020    1:01 PM 08/26/2019    1:39 PM  PFT Results  FVC-Pre L 2.10  2.09  2.10  2.14  2.12  2.32  2.36   FVC-Predicted Pre % 66  66  65  66  66  72  72   Pre FEV1/FVC % % 90  78  91  90  89  89  88   FEV1-Pre L 1.90  1.64  1.91  1.91  1.88  2.07  2.08   FEV1-Predicted Pre % 79  68  78  78  77  84  83   DLCO uncorrected ml/min/mmHg 11.75  11.10  14.80  15.98  14.63  13.98  15.62   DLCO UNC% % 58  54  72  78  72  68  76   DLCO corrected ml/min/mmHg 11.75  11.14  14.80  15.98  14.63  13.98  15.62   DLCO COR %Predicted % 58  55  72  78  72  68  76   DLVA Predicted % 96  94  113  109  114  107  113        has a past medical history of Broken humerus (02/2022), Coronary artery calcification seen on CT scan,  Essential hypertension, Fibromyalgia, Idiopathic pulmonary fibrosis (HCC), Mixed hyperlipidemia, Osteoporosis, and Type 2 diabetes mellitus (HCC).   reports that she has never smoked. She has been exposed to tobacco smoke. She has never used smokeless tobacco.  Past Surgical History:  Procedure Laterality Date   ABDOMINAL HYSTERECTOMY  2007   CATARACT EXTRACTION Bilateral     Allergies  Allergen Reactions   Latex    Pseudoephedrine Other (See Comments)    Rash and hallucinations    Sulfa Antibiotics    Sulfonamide Derivatives Rash    Immunization History  Administered Date(s) Administered   Fluad Quad(high Dose 65+) 01/29/2019, 12/16/2019, 01/24/2021   Influenza Split 12/18/2017   Influenza-Unspecified 11/18/2013   PFIZER(Purple Top)SARS-COV-2 Vaccination 05/15/2019, 06/13/2019   PNEUMOCOCCAL CONJUGATE-20 03/20/2022   Tdap 05/04/1999    Family History  Problem Relation Age of Onset   Hypertension Mother    Diabetes Mother    Osteoporosis Mother    Hypertension Father    Heart disease Father    COPD Father    Colon cancer Sister    Colon cancer Brother    Heart disease Brother    Heart disease Brother    Emphysema Paternal Grandfather      Current Outpatient Medications:    ACCU-CHEK AVIVA PLUS test strip, , Disp: , Rfl:    Accu-Chek Softclix Lancets lancets, , Disp: , Rfl:    albuterol (VENTOLIN HFA) 108 (90 Base) MCG/ACT inhaler, Inhale 2 puffs into the lungs every 4 (four) hours as needed for wheezing or shortness of breath., Disp: , Rfl:    apixaban (ELIQUIS) 5 MG TABS tablet, Take 1 tablet (5 mg total) by mouth 2 (two) times daily. Start in 1 month after completing initial Starter pack, Disp: 56 tablet, Rfl: 0   Azelastine HCl 137 MCG/SPRAY SOLN, , Disp: , Rfl:    Calcium Carbonate (CALTRATE 600 PO), Take 1 tablet by mouth 2 (two) times a day., Disp: , Rfl:    clonazePAM (KLONOPIN) 0.5 MG tablet, Take 0.5 mg by mouth at bedtime., Disp: , Rfl:    cyanocobalamin  (VITAMIN B12) 1000 MCG tablet, Take 1,000 mcg by mouth daily., Disp: , Rfl:    ESBRIET 801 MG TABS, Take 801 mg by mouth 3 (three) times daily with meals., Disp: 270 tablet, Rfl: 1   fenofibrate (TRICOR) 48 MG tablet, Take 48 mg by mouth at  bedtime., Disp: , Rfl:    fluticasone (FLONASE) 50 MCG/ACT nasal spray, Place 2 sprays into both nostrils daily., Disp: , Rfl:    fluticasone (FLOVENT HFA) 110 MCG/ACT inhaler, INHALE 1 PUFF INTO THE LUNGS 2 TIMES A DAY., Disp: 12 g, Rfl: 3   Fluticasone Furoate (ARNUITY ELLIPTA) 100 MCG/ACT AEPB, Inhale 1 puff into the lungs daily., Disp: 30 each, Rfl: 6   levocetirizine (XYZAL) 5 MG tablet, Take 5 mg by mouth daily., Disp: , Rfl:    losartan (COZAAR) 50 MG tablet, Take 50 mg by mouth daily., Disp: , Rfl:    metFORMIN (GLUCOPHAGE-XR) 500 MG 24 hr tablet, Take 500 mg by mouth daily with breakfast. , Disp: , Rfl:    metoprolol succinate (TOPROL-XL) 100 MG 24 hr tablet, Take 100 mg by mouth daily. Take with or immediately following a meal., Disp: , Rfl:    omeprazole (PRILOSEC) 20 MG capsule, TAKE 1 CAPSULE BY MOUTH ONCE DAILY., Disp: 30 capsule, Rfl: 3   REPATHA SURECLICK 140 MG/ML SOAJ, Inject 1 mL into the skin every 14 (fourteen) days., Disp: , Rfl:    SAVELLA 50 MG TABS tablet, Take 50 mg by mouth 2 (two) times daily. , Disp: , Rfl:    spironolactone (ALDACTONE) 25 MG tablet, take (1/2) tablet by mouth daily., Disp: 45 tablet, Rfl: 2   tiZANidine (ZANAFLEX) 4 MG tablet, TAKE (1) TABLET EVERY NIGHT AT BEDTIME., Disp: 30 tablet, Rfl: 0      Objective:   Vitals:   09/05/22 1113  BP: 120/76  Pulse: 79  Temp: 98.2 F (36.8 C)  TempSrc: Oral  SpO2: 97%  Weight: 167 lb 12.8 oz (76.1 kg)  Height: 5\' 5"  (1.651 m)    Estimated body mass index is 27.92 kg/m as calculated from the following:   Height as of this encounter: 5\' 5"  (1.651 m).   Weight as of this encounter: 167 lb 12.8 oz (76.1 kg).  @WEIGHTCHANGE @  [employer]   09/05/22 1113   Weight: 167 lb 12.8 oz (76.1 kg)     Physical Exam   General: No distress. Looks well O2 at rest: no Cane present: no Sitting in wheel chair: no Frail: no Obese: no but OVERWEIGHT Neuro: Alert and Oriented x 3. GCS 15. Speech normal Psych: Pleasant Resp:  Barrel Chest - no.  Wheeze - no, Crackles - yes, No overt respiratory distress CVS: Normal heart sounds. Murmurs - no Ext: Stigmata of Connective Tissue Disease - no HEENT: Normal upper airway. PEERL +. No post nasal drip        Assessment:       ICD-10-CM   1. IPF (idiopathic pulmonary fibrosis) (HCC)  J84.112     2. Medication monitoring encounter  Z51.81     3. Gastroesophageal reflux disease, unspecified whether esophagitis present  K21.9     4. DOE (dyspnea on exertion)  R06.09     5. Drug-induced weight loss  R63.4    T50.905A     6. History of pulmonary embolism  Z86.711          Plan:     Patient Instructions  IPF (idiopathic pulmonary fibrosis) (HCC) Medication monitoring encounter Gastroesophageal reflux disease, unspecified whether esophagitis present Dyspnea on exertion    - According to symptoms, exercise hypoxemia test and radiology - disease is stable  - but according to breathing test - disesase is slowly progressive - -Normal echocardiogram  Jan 2024 and normal BNP dec 2023 make pulmonary hypertension unlikely  Plan -Continue acid reflux medications -Continue pirfenidone per schedule   -At this point in time benefit outweighs risk  -Continue space 5-6h between meals and apply sunscreen when going out - Check LFT 09/05/2022 nd then LFT every 3 months -We discussed clinical trials as a care option and appreciate your interest  -take consent form for [employer] study; can consider enrollment in Q3 or Q4 2024 -  Do spiro/dlco in 3-4 months - refer pulmonary rehab at [facility] - RSV vaccine commercially  - Hihg dose flu shot In fall  Drug-induced weight loss  - weight loss slowly  with pirfenidone ;but recently better bcause you increased your sugar intake  -At this point in time is still are technically overweight but you are getting closer to normal weight  PLan -We will leverage pirfenidone and helping you with your weight loss -We will continue to monitor -cut down on sugar intake   Pulmonary Embolism with Rt lower extremity DVT Dec 2023  - ? Due to covid as risk factor and shoulder injury  Plan  - continue eliquis as before;'  - check d-dimer 09/05/2022 and decide on length of treatment   Followup -Return to see  DR Marchelle Gearing in 4 months - 30 min visit but after spiro/dlco   ( Level 05 visit E&M 2024: Estb >= 40 min visit type: on-site physical face to visit  in total care time and counseling or/and coordination of care by this undersigned MD - Dr Kalman Shan. This includes one or more of the following on this same day 09/05/2022: pre-charting, chart review, note writing, documentation discussion of test results, diagnostic or treatment recommendations, prognosis, risks and benefits of management options, instructions, education, compliance or risk-factor reduction. It excludes time spent by the CMA or office staff in the care of the patient. Actual time 40 min)   SIGNATURE    Dr. Kalman Shan, M.D., F.C.C.P,  Pulmonary and Critical Care Medicine Staff Physician, [facility] Center Director - Interstitial Lung Disease  Program  Pulmonary Fibrosis [facility] [place], [place], [address]  Pager: [phone], If no answer or between  15:00h - 7:00h: call [phone] Telephone: [phone]  11:39 AM 09/05/2022

## 2022-09-05 NOTE — Patient Instructions (Addendum)
IPF (idiopathic pulmonary fibrosis) (HCC) Medication monitoring encounter Gastroesophageal reflux disease, unspecified whether esophagitis present Dyspnea on exertion    - According to symptoms, exercise hypoxemia test and radiology - disease is stable  - but according to breathing test - disesase is slowly progressive - -Normal echocardiogram  Jan 2024 and normal BNP dec 2023 make pulmonary hypertension unlikely   Plan -Continue acid reflux medications -Continue pirfenidone per schedule   -At this point in time benefit outweighs risk  -Continue space 5-6h between meals and apply sunscreen when going out - Check LFT 09/05/2022 nd then LFT every 3 months -We discussed clinical trials as a care option and appreciate your interest  -take consent form for BEACON study; can consider enrollment in Q3 or Q4 2024 -  Do spiro/dlco in 3-4 months - refer pulmonary rehab at [facility] - RSV vaccine commercially  - Hihg dose flu shot In fall  Drug-induced weight loss  - weight loss slowly with pirfenidone ;but recently better bcause you increased your sugar intake  -At this point in time is still are technically overweight but you are getting closer to normal weight  PLan -We will leverage pirfenidone and helping you with your weight loss -We will continue to monitor -cut down on sugar intake   Pulmonary Embolism with Rt lower extremity DVT Dec 2023  - ? Due to covid as risk factor and shoulder injury  Plan  - continue eliquis as before;'  - check d-dimer 09/05/2022 and decide on length of treatment   Followup -Return to see  DR Marchelle Gearing in 4 months - 30 min visit but after spiro/dlco

## 2022-09-25 ENCOUNTER — Other Ambulatory Visit: Payer: Self-pay | Admitting: Physician Assistant

## 2022-09-25 DIAGNOSIS — I824Z1 Acute embolism and thrombosis of unspecified deep veins of right distal lower extremity: Secondary | ICD-10-CM

## 2022-09-25 DIAGNOSIS — I2699 Other pulmonary embolism without acute cor pulmonale: Secondary | ICD-10-CM

## 2022-09-25 MED ORDER — APIXABAN 5 MG PO TABS
5.0000 mg | ORAL_TABLET | Freq: Two times a day (BID) | ORAL | 0 refills | Status: DC
Start: 2022-09-25 — End: 2022-09-25

## 2022-09-25 MED ORDER — APIXABAN 5 MG PO TABS
5.0000 mg | ORAL_TABLET | Freq: Two times a day (BID) | ORAL | 0 refills | Status: DC
Start: 2022-09-25 — End: 2022-10-10

## 2022-09-25 NOTE — Addendum Note (Signed)
Addended by: Rojelio Brenner on: 09/25/2022 03:50 PM   Modules accepted: Orders

## 2022-09-25 NOTE — Telephone Encounter (Signed)
Contacted to advised that eliquis samples and BMS-PAF application never picked up and has been ready since 07/11/2022. Reports that she ran out of eliquis last week and didn't think she needed to stay on it since it had been 6 months since starting eliquis for PE. Will forward to provider to clarify if patient should stay on eliquis or if its okay to stop it.

## 2022-09-25 NOTE — Telephone Encounter (Addendum)
Patient informed and verbalized understanding of plan. Eliquis samples restocked since never picked up by patient and application shredded. Lot# [id number] exp 09/17/2023

## 2022-09-25 NOTE — Telephone Encounter (Signed)
Thank you for letting me know.  I have sent a refill prescription for Eliquis to her pharmacy, and my nurse was able to get in touch with her today to let her know to continue taking blood thinner until I see her for follow-up visit at the end of this month. Thanks much! - Stepahnie Campo

## 2022-09-28 ENCOUNTER — Telehealth: Payer: Self-pay | Admitting: Cardiology

## 2022-09-28 NOTE — Telephone Encounter (Signed)
*  STAT* If patient is at the pharmacy, call can be transferred to refill team.   1. Which medications need to be refilled? (please list name of each medication and dose if known) apixaban (ELIQUIS) 5 MG TABS tablet  2. Which pharmacy/location (including street and city if local pharmacy) is medication to be sent to? [id number] - [place], [place] - [address]  3. Do they need a 30 day or 90 day supply?  30 day supply  Patient states Monday someone called and informed her that her medication was being sent to the pharmacy and she has been out of medication since the end of June

## 2022-09-28 NOTE — Telephone Encounter (Signed)
Medication refilled on 09/25/22

## 2022-10-03 ENCOUNTER — Ambulatory Visit (HOSPITAL_COMMUNITY)
Admission: RE | Admit: 2022-10-03 | Discharge: 2022-10-03 | Disposition: A | Discharge status: Home / Self Care | Payer: Medicare Other | Source: Ambulatory Visit | Attending: Physician Assistant | Admitting: Physician Assistant

## 2022-10-03 ENCOUNTER — Inpatient Hospital Stay: Payer: Medicare Other | Attending: Hematology

## 2022-10-03 DIAGNOSIS — I2699 Other pulmonary embolism without acute cor pulmonale: Secondary | ICD-10-CM | POA: Insufficient documentation

## 2022-10-03 DIAGNOSIS — Z7901 Long term (current) use of anticoagulants: Secondary | ICD-10-CM | POA: Insufficient documentation

## 2022-10-03 DIAGNOSIS — I824Z1 Acute embolism and thrombosis of unspecified deep veins of right distal lower extremity: Secondary | ICD-10-CM | POA: Insufficient documentation

## 2022-10-03 DIAGNOSIS — J84112 Idiopathic pulmonary fibrosis: Secondary | ICD-10-CM | POA: Insufficient documentation

## 2022-10-03 DIAGNOSIS — Z86711 Personal history of pulmonary embolism: Secondary | ICD-10-CM | POA: Insufficient documentation

## 2022-10-03 DIAGNOSIS — I2510 Atherosclerotic heart disease of native coronary artery without angina pectoris: Secondary | ICD-10-CM | POA: Diagnosis not present

## 2022-10-03 DIAGNOSIS — Z79899 Other long term (current) drug therapy: Secondary | ICD-10-CM | POA: Insufficient documentation

## 2022-10-03 DIAGNOSIS — Z79818 Long term (current) use of other agents affecting estrogen receptors and estrogen levels: Secondary | ICD-10-CM | POA: Insufficient documentation

## 2022-10-03 DIAGNOSIS — J849 Interstitial pulmonary disease, unspecified: Secondary | ICD-10-CM | POA: Diagnosis not present

## 2022-10-03 DIAGNOSIS — Z86718 Personal history of other venous thrombosis and embolism: Secondary | ICD-10-CM | POA: Diagnosis not present

## 2022-10-03 DIAGNOSIS — I700 Atherosclerosis of aorta: Secondary | ICD-10-CM | POA: Diagnosis not present

## 2022-10-03 DIAGNOSIS — R591 Generalized enlarged lymph nodes: Secondary | ICD-10-CM | POA: Diagnosis not present

## 2022-10-03 LAB — COMPREHENSIVE METABOLIC PANEL
ALT: 17 U/L (ref 0–44)
AST: 20 U/L (ref 15–41)
Albumin: 3.7 g/dL (ref 3.5–5.0)
Alkaline Phosphatase: 79 U/L (ref 38–126)
Anion gap: 8 (ref 5–15)
BUN: 12 mg/dL (ref 8–23)
CO2: 25 mmol/L (ref 22–32)
Calcium: 9.7 mg/dL (ref 8.9–10.3)
Chloride: 106 mmol/L (ref 98–111)
Creatinine, Ser: 0.81 mg/dL (ref 0.44–1.00)
GFR, Estimated: >60 mL/min (ref >60)
Glucose, Bld: 184 mg/dL — ABNORMAL HIGH (ref 70–99)
Potassium: 4.3 mmol/L (ref 3.5–5.1)
Sodium: 139 mmol/L (ref 135–145)
Total Bilirubin: 0.5 mg/dL (ref 0.3–1.2)
Total Protein: 7.2 g/dL (ref 6.5–8.1)

## 2022-10-03 LAB — CBC WITH DIFFERENTIAL/PLATELET
Abs Immature Granulocytes: 0.06 10*3/uL (ref 0.00–0.07)
Basophils Absolute: 0.1 10*3/uL (ref 0.0–0.1)
Basophils Relative: 1 %
Eosinophils Absolute: 0.1 10*3/uL (ref 0.0–0.5)
Eosinophils Relative: 1 %
HCT: 41.9 % (ref 36.0–46.0)
Hemoglobin: 13.2 g/dL (ref 12.0–15.0)
Immature Granulocytes: 1 %
Lymphocytes Relative: 22 %
Lymphs Abs: 2.1 10*3/uL (ref 0.7–4.0)
MCH: 30.8 pg (ref 26.0–34.0)
MCHC: 31.5 g/dL (ref 30.0–36.0)
MCV: 97.7 fL (ref 80.0–100.0)
Monocytes Absolute: 0.7 10*3/uL (ref 0.1–1.0)
Monocytes Relative: 7 %
Neutro Abs: 6.7 10*3/uL (ref 1.7–7.7)
Neutrophils Relative %: 68 %
Platelets: 397 10*3/uL (ref 150–400)
RBC: 4.29 MIL/uL (ref 3.87–5.11)
RDW: 12.9 % (ref 11.5–15.5)
WBC: 9.8 10*3/uL (ref 4.0–10.5)
nRBC: 0 % (ref 0.0–0.2)

## 2022-10-03 LAB — D-DIMER, QUANTITATIVE: D-Dimer, Quant: 0.35 ug/mL-FEU (ref 0.00–0.50)

## 2022-10-03 MED ORDER — IOHEXOL 350 MG/ML SOLN
75.0000 mL | Freq: Once | INTRAVENOUS | Status: AC | PRN
  Administered 2022-10-03: 75 mL via INTRAVENOUS

## 2022-10-05 DIAGNOSIS — B029 Zoster without complications: Secondary | ICD-10-CM | POA: Diagnosis not present

## 2022-10-09 NOTE — Progress Notes (Unsigned)
[facility] [address], [place] [address]   CLINIC:  Medical Oncology/Hematology  PCP:  Lianne Moris, PA-C [address] [place] [address] [phone]   REASON FOR VISIT:  Follow-up for provoked right leg DVT and PE  CURRENT THERAPY: Eliquis  INTERVAL HISTORY:   Nichole Reyes 72 y.o. female returns for routine follow-up of her provoked right leg DVT and PE.  She was last seen by Rojelio Brenner, PA-C on 06/01/2022.  She continues to take Eliquis twice daily and is tolerating this well.  She has occasional epistaxis lasting less than 5 minutes, but reports that this was chronic before starting Eliquis.  She denies any hematochezia, melena, hematuria, hematemesis.  She has baseline shortness of breath and dyspnea related to her pulmonary fibrosis. She denies any chest pain, hemoptysis, palpitations, or lightheadedness.  No unilateral leg swelling or pain.  She has 60% energy and 30% appetite. She endorses that she is maintaining a stable weight.   ASSESSMENT & PLAN:  1.  DVT and pulmonary embolism, provoked: - COVID infection 02/11/2022, fever and taste altered, treated with Paxlovid - Left humerus fracture (03/09/2022) from mechanical fall while walking her dog.  After the fracture, she was wearing a sling and mostly stayed on the couch. - CT angio (03/17/2022): Acute PE with moderate embolic burden.  Evidence of right heart strain consistent with at least submassive PE. - Doppler (03/19/2022): Acute DVT limited to the right gastrocnemius calf vein.  No evidence of left lower extremity DVT. - It appears that she has at least 2 provoking factors including COVID infection and immobility from fall and left arm fracture. - She is tolerating Eliquis very well (since December 2023). - Coagulopathy testing was negative (checked for lupus anticoagulant, anticardiolipin antibodies, antibeta 2 glycoprotein 1 antibodies, factor V Leiden, prothrombin gene mutation) --  Patient was treated with 109-month course of Eliquis due to large clot burden -- Venous US right lower extremity (10/03/2022):  No evidence of DVT or SVT in right leg, with particular attention paid to the right gastrocnemius vein -- CTA Chest (10/03/2022):  No evidence of pulmonary embolism - Per Dr. Ellin Saba, anticoagulation for 6 months duration recommended due to clot load.  Would consider discontinuing anticoagulation if evidence of clot resolution on follow-up imaging. - PLAN: Initial episodes of DVT/PE was provoked, and has resolved per imaging from 10/03/2022.  Therefore, we will discontinue Eliquis and discharge patient from the clinic. -- She is aware that prior blood clots increases her risk for future blood clots, although there is no strong indication for long-term anticoagulation at this time. -- If she has any recurrent clots, she should return for additional consultation and would likely need indefinited anticoagulation in that event.  2.  Pulmonary fibrosis: - She is on pirfenidone.  She lost 10 pounds since she was started on it. - Uses Flovent twice daily and albuterol.  3.  Social/family history: - Lives at home with her husband.  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  Later she went on disability secondary to fibromyalgia.  She is non-smoker. - No family history of DVTs or miscarriages.  Sister died of colon cancer.  Brother had colon cancer.  PLAN SUMMARY: >> Discharge from clinic      REVIEW OF SYSTEMS:   Review of Systems  Constitutional:  Positive for fatigue. Negative for appetite change, chills, diaphoresis, fever and unexpected weight change.  HENT:   Negative for lump/mass and nosebleeds.  Eyes:  Negative for eye problems.  Respiratory:  Negative for cough, hemoptysis and shortness of breath.   Cardiovascular:  Negative for chest pain, leg swelling and palpitations.  Gastrointestinal:  Positive for nausea. Negative for abdominal pain, blood in  stool, constipation, diarrhea and vomiting.  Genitourinary:  Negative for frequency and hematuria.   Skin: Negative.   Neurological:  Positive for headaches. Negative for dizziness and light-headedness.  Hematological:  Does not bruise/bleed easily.  Psychiatric/Behavioral:  Positive for sleep disturbance.      PHYSICAL EXAM:  ECOG PERFORMANCE STATUS: 0 - Asymptomatic  There were no vitals filed for this visit. There were no vitals filed for this visit. Physical Exam Constitutional:      Appearance: Normal appearance. She is normal weight.  Cardiovascular:     Heart sounds: Normal heart sounds.  Pulmonary:     Breath sounds: Normal breath sounds. Decreased air movement present.  Neurological:     General: No focal deficit present.     Mental Status: Mental status is at baseline.  Psychiatric:        Behavior: Behavior normal. Behavior is cooperative.     PAST MEDICAL/SURGICAL HISTORY:  Past Medical History:  Diagnosis Date   Broken humerus 02/2022   right   Coronary artery calcification seen on CT scan    Essential hypertension    Fibromyalgia    Idiopathic pulmonary fibrosis (HCC)    Mixed hyperlipidemia    Osteoporosis    Type 2 diabetes mellitus (HCC)    Past Surgical History:  Procedure Laterality Date   ABDOMINAL HYSTERECTOMY  2007   CATARACT EXTRACTION Bilateral     SOCIAL HISTORY:  Social History   Socioeconomic History   Marital status: Married    Spouse name: Not on file   Number of children: Not on file   Years of education: Not on file   Highest education level: Not on file  Occupational History   Not on file  Tobacco Use   Smoking status: Never    Passive exposure: Past   Smokeless tobacco: Never  Vaping Use   Vaping status: Never Used  Substance and Sexual Activity   Alcohol use: Not Currently    Comment: ocassional   Drug use: No   Sexual activity: Not on file  Other Topics Concern   Not on file  Social History Narrative   She is  retired from Engineering geologist and working in a daycare.   Social Determinants of Health   Financial Resource Strain: Not on file  Food Insecurity: No Food Insecurity (05/05/2022)   Hunger Vital Sign    Worried About Running Out of Food in the Last Year: Never true    Ran Out of Food in the Last Year: Never true  Transportation Needs: No Transportation Needs (05/05/2022)   PRAPARE - Administrator, Civil Service (Medical): No    Lack of Transportation (Non-Medical): No  Physical Activity: Not on file  Stress: Not on file  Social Connections: Not on file  Intimate Partner Violence: Not At Risk (05/05/2022)   Humiliation, Afraid, Rape, and Kick questionnaire    Fear of Current or Ex-Partner: No    Emotionally Abused: No    Physically Abused: No    Sexually Abused: No    FAMILY HISTORY:  Family History  Problem Relation Age of Onset   Hypertension Mother    Diabetes Mother    Osteoporosis Mother    Hypertension Father    Heart disease Father  COPD Father    Colon cancer Sister    Colon cancer Brother    Heart disease Brother    Heart disease Brother    Emphysema Paternal Grandfather     CURRENT MEDICATIONS:  Outpatient Encounter Medications as of 10/10/2022  Medication Sig   ACCU-CHEK AVIVA PLUS test strip    Accu-Chek Softclix Lancets lancets    albuterol (VENTOLIN HFA) 108 (90 Base) MCG/ACT inhaler Inhale 2 puffs into the lungs every 4 (four) hours as needed for wheezing or shortness of breath.   apixaban (ELIQUIS) 5 MG TABS tablet Take 1 tablet (5 mg total) by mouth 2 (two) times daily.   Azelastine HCl 137 MCG/SPRAY SOLN    Calcium Carbonate (CALTRATE 600 PO) Take 1 tablet by mouth 2 (two) times a day.   clonazePAM (KLONOPIN) 0.5 MG tablet Take 0.5 mg by mouth at bedtime.   cyanocobalamin (VITAMIN B12) 1000 MCG tablet Take 1,000 mcg by mouth daily.   ESBRIET 801 MG TABS Take 801 mg by mouth 3 (three) times daily with meals.   fenofibrate (TRICOR) 48 MG tablet Take  48 mg by mouth at bedtime.   fluticasone (FLONASE) 50 MCG/ACT nasal spray Place 2 sprays into both nostrils daily.   fluticasone (FLOVENT HFA) 110 MCG/ACT inhaler INHALE 1 PUFF INTO THE LUNGS 2 TIMES A DAY.   Fluticasone Furoate (ARNUITY ELLIPTA) 100 MCG/ACT AEPB Inhale 1 puff into the lungs daily.   levocetirizine (XYZAL) 5 MG tablet Take 5 mg by mouth daily.   losartan (COZAAR) 50 MG tablet Take 50 mg by mouth daily.   metFORMIN (GLUCOPHAGE-XR) 500 MG 24 hr tablet Take 500 mg by mouth daily with breakfast.    metoprolol succinate (TOPROL-XL) 100 MG 24 hr tablet Take 100 mg by mouth daily. Take with or immediately following a meal.   omeprazole (PRILOSEC) 20 MG capsule TAKE 1 CAPSULE BY MOUTH ONCE DAILY.   REPATHA SURECLICK 140 MG/ML SOAJ Inject 1 mL into the skin every 14 (fourteen) days.   SAVELLA 50 MG TABS tablet Take 50 mg by mouth 2 (two) times daily.    spironolactone (ALDACTONE) 25 MG tablet take (1/2) tablet by mouth daily.   tiZANidine (ZANAFLEX) 4 MG tablet TAKE (1) TABLET EVERY NIGHT AT BEDTIME.   No facility-administered encounter medications on file as of 10/10/2022.    ALLERGIES:  Allergies  Allergen Reactions   Latex    Pseudoephedrine Other (See Comments)    Rash and hallucinations    Sulfa Antibiotics    Sulfonamide Derivatives Rash    LABORATORY DATA:  I have reviewed the labs as listed.  CBC    Component Value Date/Time   WBC 9.8 10/03/2022 0931   RBC 4.29 10/03/2022 0931   HGB 13.2 10/03/2022 0931   HCT 41.9 10/03/2022 0931   PLT 397 10/03/2022 0931   MCV 97.7 10/03/2022 0931   MCH 30.8 10/03/2022 0931   MCHC 31.5 10/03/2022 0931   RDW 12.9 10/03/2022 0931   LYMPHSABS 2.1 10/03/2022 0931   MONOABS 0.7 10/03/2022 0931   EOSABS 0.1 10/03/2022 0931   BASOSABS 0.1 10/03/2022 0931      Latest Ref Rng & Units 10/03/2022    9:31 AM 09/05/2022   12:00 PM 06/05/2022    1:57 PM  CMP  Glucose 70 - 99 mg/dL 295     BUN 8 - 23 mg/dL 12     Creatinine 6.21 -  1.00 mg/dL 3.08     Sodium 657 - 846 mmol/L 139  Potassium 3.5 - 5.1 mmol/L 4.3     Chloride 98 - 111 mmol/L 106     CO2 22 - 32 mmol/L 25     Calcium 8.9 - 10.3 mg/dL 9.7     Total Protein 6.5 - 8.1 g/dL 7.2  7.5  7.4   Total Bilirubin 0.3 - 1.2 mg/dL 0.5  0.2  0.2   Alkaline Phos 38 - 126 U/L 79  83  89   AST 15 - 41 U/L 20  19  19    ALT 0 - 44 U/L 17  16  15      DIAGNOSTIC IMAGING:  I have independently reviewed the relevant imaging and discussed with the patient.   WRAP UP:  All questions were answered. The patient knows to call the clinic with any problems, questions or concerns.  Medical decision making: Low  Time spent on visit: I spent 15 minutes counseling the patient face to face. The total time spent in the appointment was 22 minutes and more than 50% was on counseling.  Carnella Guadalajara, PA-C  10/10/22 10:13 AM

## 2022-10-10 ENCOUNTER — Inpatient Hospital Stay: Payer: Medicare Other | Admitting: Physician Assistant

## 2022-10-10 VITALS — BP 132/69 | HR 88 | Temp 97.7°F | Resp 17 | Wt 165.1 lb

## 2022-10-10 DIAGNOSIS — I824Z1 Acute embolism and thrombosis of unspecified deep veins of right distal lower extremity: Secondary | ICD-10-CM

## 2022-10-10 DIAGNOSIS — Z79899 Other long term (current) drug therapy: Secondary | ICD-10-CM | POA: Diagnosis not present

## 2022-10-10 DIAGNOSIS — I2699 Other pulmonary embolism without acute cor pulmonale: Secondary | ICD-10-CM

## 2022-10-10 DIAGNOSIS — Z86711 Personal history of pulmonary embolism: Secondary | ICD-10-CM | POA: Diagnosis not present

## 2022-10-10 DIAGNOSIS — Z86718 Personal history of other venous thrombosis and embolism: Secondary | ICD-10-CM | POA: Diagnosis present

## 2022-10-10 DIAGNOSIS — J84112 Idiopathic pulmonary fibrosis: Secondary | ICD-10-CM | POA: Diagnosis not present

## 2022-10-10 DIAGNOSIS — Z79818 Long term (current) use of other agents affecting estrogen receptors and estrogen levels: Secondary | ICD-10-CM | POA: Diagnosis not present

## 2022-10-10 DIAGNOSIS — Z7901 Long term (current) use of anticoagulants: Secondary | ICD-10-CM | POA: Diagnosis not present

## 2022-10-10 NOTE — Patient Instructions (Signed)
[facility] **VISIT SUMMARY & IMPORTANT INSTRUCTIONS **   You were seen today by Rojelio Brenner PA-C for your history of blood clots.   Your CT scan and ultrasound showed that your blood counts have RESOLVED. You can STOP taking Eliquis (blood thinner) at this time. You do not need any follow-up appointments with our clinic for the time being.  However, you do have a slightly increased risk of future blood clots.  Please be aware of the signs and symptoms of blood clot in your legs or your lungs (see attached handouts) and make an appointment to see [place] again if you have any future episodes.  ** Thank you for trusting me with your healthcare!  I strive to provide all of my patients with quality care at each visit.  If you receive a survey for this visit, I would be so grateful to you for taking the time to provide feedback.  Thank you in advance!  ~ Xzayvier Fagin                   Dr. Doreatha Massed   &   Rojelio Brenner, PA-C   - - - - - - - - - - - - - - - - - -    Thank you for choosing [facility] at [facility] to provide your oncology and hematology care.  To afford each patient quality time with our provider, please arrive at least 15 minutes before your scheduled appointment time.   If you have a lab appointment with the Cancer Center please come in thru the Main Entrance and check in at the main information desk.  You need to re-schedule your appointment should you arrive 10 or more minutes late.  We strive to give you quality time with our providers, and arriving late affects you and other patients whose appointments are after yours.  Also, if you no show three or more times for appointments you may be dismissed from the clinic at the providers discretion.     Again, thank you for choosing [facility].  Our hope is that these requests will decrease the amount of time that you wait before being seen by our  physicians.       _____________________________________________________________  Should you have questions after your visit to [facility], please contact our office at [phone] and follow the prompts.  Our office hours are 8:00 a.m. and 4:30 p.m. Monday - Friday.  Please note that voicemails left after 4:00 p.m. may not be returned until the following business day.  We are closed weekends and major holidays.  You do have access to a nurse 24-7, just call the main number to the clinic [phone] and do not press any options, hold on the line and a nurse will answer the phone.    For prescription refill requests, have your pharmacy contact our office and allow 72 hours.

## 2022-10-30 ENCOUNTER — Encounter: Payer: Self-pay | Admitting: Pharmacist

## 2022-10-30 DIAGNOSIS — T466X5A Adverse effect of antihyperlipidemic and antiarteriosclerotic drugs, initial encounter: Secondary | ICD-10-CM

## 2022-10-30 NOTE — Progress Notes (Signed)
[facility]) [facility] Quality Pharmacy Team Statin Quality Measure Assessment  10/30/2022  SHAQUANDRA VIDOVICH [birth year] [id number]  Per review of chart and payor information, Ms. Dewing has a diagnosis of diabetes but is not currently filling a statin prescription.  This places patient into the Statin Use In Patients with Diabetes (SUPD) measure for CMS.    Patient has documented trials of statins with reported myopathy, but no corresponding CPT codes that would exclude patient from SUPD measure.  Ms. 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  Code for past statin intolerance or  other exclusions (required annually)  Provider Requirements: Associate code during an office visit or telehealth encounter  Drug Induced Myopathy G72.0   Myopathy, unspecified G72.9   Myositis, unspecified M60.9   Rhabdomyolysis M62.82   Cirrhosis of liver K74.69   Prediabetes R73.03   PCOS E28.2   Thank you for allowing [facility] pharmacy to be a part of this patient's care. Dellie Burns, PharmD Clinical Pharmacist [place]  Direct Dial: [phone]

## 2022-10-31 ENCOUNTER — Ambulatory Visit: Payer: Medicare Other | Attending: Cardiology | Admitting: Cardiology

## 2022-10-31 ENCOUNTER — Encounter: Payer: Self-pay | Admitting: Cardiology

## 2022-10-31 VITALS — BP 118/72 | HR 64 | Ht 65.0 in | Wt 166.4 lb

## 2022-10-31 DIAGNOSIS — T466X5A Adverse effect of antihyperlipidemic and antiarteriosclerotic drugs, initial encounter: Secondary | ICD-10-CM | POA: Diagnosis not present

## 2022-10-31 DIAGNOSIS — I10 Essential (primary) hypertension: Secondary | ICD-10-CM | POA: Diagnosis not present

## 2022-10-31 DIAGNOSIS — M791 Myalgia, unspecified site: Secondary | ICD-10-CM

## 2022-10-31 DIAGNOSIS — E782 Mixed hyperlipidemia: Secondary | ICD-10-CM

## 2022-10-31 DIAGNOSIS — I2510 Atherosclerotic heart disease of native coronary artery without angina pectoris: Secondary | ICD-10-CM | POA: Diagnosis not present

## 2022-10-31 NOTE — Patient Instructions (Addendum)

## 2022-10-31 NOTE — Progress Notes (Signed)
    Cardiology Office Note  Date: 10/31/2022   ID: Nichole Reyes, DOB [birth year], MRN [id number]  History of Present Illness: Nichole Reyes is a 72 y.o. female last seen in February by Ms. Philis Nettle NP, I reviewed the note.  She is here for a routine visit.  Reports no angina, does mention a sporadic sense of palpitations, has not been progressive associated with syncope.  She does try to walk for exercise, usually about a half a mile most days of the week.  Does some work in her garden as well, but has to pace herself.  I reviewed her medications.  She reports completed 98-month course of Eliquis for treatment of pulmonary embolus, now back on baby aspirin daily.  Also continues to follow in the pulmonary clinic for treatment of idiopathic pulmonary fibrosis.  I reviewed her recent lab work which is noted below.  Physical Exam: VS:  BP 118/72   Pulse 64   Ht 5\' 5"  (1.651 m)   Wt 166 lb 6.4 oz (75.5 kg)   SpO2 98%   BMI 27.69 kg/m , BMI Body mass index is 27.69 kg/m.  Wt Readings from Last 3 Encounters:  10/31/22 166 lb 6.4 oz (75.5 kg)  10/10/22 165 lb 2 oz (74.9 kg)  09/05/22 167 lb 12.8 oz (76.1 kg)    General: Patient appears comfortable at rest. HEENT: Conjunctiva and lids normal. Neck: Supple, no elevated JVP or carotid bruits. Lungs: Clear to auscultation, nonlabored breathing at rest. Cardiac: Regular rate and rhythm, no S3 or significant systolic murmur. Extremities: No pitting edema.  ECG:  An ECG dated 04/28/2022 was personally reviewed today and demonstrated:  Sinus tachycardia with left anterior fascicular block, decreased R wave progression with increased voltage.  Labwork: May 2023: Cholesterol 173, triglycerides 240, HDL 69, LDL 66 03/17/2022: B Natriuretic Peptide 60.0 10/03/2022: ALT 17; AST 20; BUN 12; Creatinine, Ser 0.81; Hemoglobin 13.2; Platelets 397; Potassium 4.3; Sodium 139   Other Studies Reviewed Today:  Chest CT 10/03/2022: IMPRESSION: 1. No  evidence of pulmonary embolism. No acute intrathoracic process. 2. Unchanged interstitial lung disease. 3.  Aortic Atherosclerosis (ICD10-I70.0).  Assessment and Plan:  1.  Coronary artery calcification and aortic atherosclerosis evident by CT imaging.  Lexiscan Myoview in July 2022 was negative for ischemia with LVEF 68%.  She does not report any angina, I reviewed her ECG from February.  Continue aspirin 81 mg daily and Repatha.  2.  Mixed hyperlipidemia with statin myalgias.  She is on Repatha and Tricor, LDL 66 in May of last year.  3.  Idiopathic pulmonary fibrosis followed by Pulmonary.  4.  Essential hypertension.  Blood pressure well-controlled today.  She is also on losartan and Aldactone.  Disposition:  Follow up  6 months.  Signed, Jonelle Sidle, M.D., F.A.C.C. [facility]

## 2022-11-09 DIAGNOSIS — M816 Localized osteoporosis [Lequesne]: Secondary | ICD-10-CM | POA: Diagnosis not present

## 2022-11-09 DIAGNOSIS — Z1231 Encounter for screening mammogram for malignant neoplasm of breast: Secondary | ICD-10-CM | POA: Diagnosis not present

## 2022-11-09 LAB — HM DEXA SCAN

## 2022-11-14 ENCOUNTER — Telehealth: Payer: Self-pay | Admitting: Pharmacist

## 2022-11-14 NOTE — Telephone Encounter (Signed)
Received fax from [facility] stating patient will no longer be able to receive Esbriet free of charge in 2025. She will continue to receive Esbriet for free through 03/20/2023.  Unfortunately pharmacy team will be unable to run any benefits for patient until 2025. We will have to re-investigate benefits through insurance in 2025.  Chesley Mires, PharmD, MPH, BCPS, CPP Clinical Pharmacist (Rheumatology and Pulmonology)

## 2022-11-22 ENCOUNTER — Other Ambulatory Visit: Payer: Self-pay | Admitting: Pharmacist

## 2022-11-22 DIAGNOSIS — J84112 Idiopathic pulmonary fibrosis: Secondary | ICD-10-CM

## 2022-11-22 MED ORDER — ESBRIET 801 MG PO TABS
801.0000 mg | ORAL_TABLET | Freq: Three times a day (TID) | ORAL | 1 refills | Status: DC
Start: 2022-11-22 — End: 2023-04-13

## 2022-11-22 NOTE — Telephone Encounter (Signed)
Refill sent for ESBRIET to [facility] ([facility]) for Esbriet: [phone]  Dose: 801mg  three times daily  Last OV: 09/05/22 Provider: Dr. Marchelle Gearing Pertinent labs: LFT on 10/03/22 wnl  Next OV: 01/04/23  Routing to scheduling team for follow-up on appt scheduling  Chesley Mires, PharmD, MPH, BCPS Clinical Pharmacist (Rheumatology and Pulmonology)

## 2022-11-30 ENCOUNTER — Other Ambulatory Visit: Payer: Self-pay

## 2022-11-30 ENCOUNTER — Other Ambulatory Visit: Payer: Self-pay | Admitting: Internal Medicine

## 2022-11-30 DIAGNOSIS — J849 Interstitial pulmonary disease, unspecified: Secondary | ICD-10-CM

## 2022-11-30 DIAGNOSIS — K219 Gastro-esophageal reflux disease without esophagitis: Secondary | ICD-10-CM

## 2022-11-30 DIAGNOSIS — R0609 Other forms of dyspnea: Secondary | ICD-10-CM

## 2022-11-30 MED ORDER — OMEPRAZOLE 20 MG PO CPDR
20.0000 mg | DELAYED_RELEASE_CAPSULE | Freq: Every day | ORAL | 11 refills | Status: DC
Start: 2022-11-30 — End: 2023-12-04

## 2022-12-19 ENCOUNTER — Institutional Professional Consult (permissible substitution) (HOSPITAL_BASED_OUTPATIENT_CLINIC_OR_DEPARTMENT_OTHER): Payer: Medicare Other | Admitting: Internal Medicine

## 2023-01-04 ENCOUNTER — Ambulatory Visit: Payer: Medicare Other | Admitting: Internal Medicine

## 2023-01-19 ENCOUNTER — Ambulatory Visit (INDEPENDENT_AMBULATORY_CARE_PROVIDER_SITE_OTHER): Payer: Medicare Other | Admitting: Internal Medicine

## 2023-01-19 DIAGNOSIS — J84112 Idiopathic pulmonary fibrosis: Secondary | ICD-10-CM

## 2023-01-19 LAB — PULMONARY FUNCTION TEST
DL/VA % pred: 93 %
DL/VA: 3.86 ml/min/mmHg/L
DLCO cor % pred: 52 %
DLCO cor: 10.69 ml/min/mmHg
DLCO unc % pred: 52 %
DLCO unc: 10.69 ml/min/mmHg
FEF 25-75 Pre: 2.69 L/s
FEF2575-%Pred-Pre: 134 %
FEV1-%Pred-Pre: 73 %
FEV1-Pre: 1.76 L
FEV1FVC-%Pred-Pre: 114 %
FEV6-%Pred-Pre: 66 %
FEV6-Pre: 2.02 L
FEV6FVC-%Pred-Pre: 104 %
FVC-%Pred-Pre: 64 %
FVC-Pre: 2.03 L
Pre FEV1/FVC ratio: 87 %
Pre FEV6/FVC Ratio: 100 %

## 2023-01-19 NOTE — Patient Instructions (Signed)
Full PFT performed today. °

## 2023-01-19 NOTE — Progress Notes (Signed)
Spirometry/DLCO performed today. 

## 2023-01-29 ENCOUNTER — Ambulatory Visit: Payer: Medicare Other | Admitting: Physician Assistant

## 2023-01-29 DIAGNOSIS — M8589 Other specified disorders of bone density and structure, multiple sites: Secondary | ICD-10-CM

## 2023-01-29 DIAGNOSIS — Z8659 Personal history of other mental and behavioral disorders: Secondary | ICD-10-CM

## 2023-01-29 DIAGNOSIS — M170 Bilateral primary osteoarthritis of knee: Secondary | ICD-10-CM

## 2023-01-29 DIAGNOSIS — M5134 Other intervertebral disc degeneration, thoracic region: Secondary | ICD-10-CM

## 2023-01-29 DIAGNOSIS — Z8639 Personal history of other endocrine, nutritional and metabolic disease: Secondary | ICD-10-CM

## 2023-01-29 DIAGNOSIS — J849 Interstitial pulmonary disease, unspecified: Secondary | ICD-10-CM

## 2023-01-29 DIAGNOSIS — Z8669 Personal history of other diseases of the nervous system and sense organs: Secondary | ICD-10-CM

## 2023-01-29 DIAGNOSIS — M797 Fibromyalgia: Secondary | ICD-10-CM

## 2023-01-29 DIAGNOSIS — R5383 Other fatigue: Secondary | ICD-10-CM

## 2023-01-29 DIAGNOSIS — G4709 Other insomnia: Secondary | ICD-10-CM

## 2023-01-29 DIAGNOSIS — Z8261 Family history of arthritis: Secondary | ICD-10-CM

## 2023-01-29 DIAGNOSIS — M5030 Other cervical disc degeneration, unspecified cervical region: Secondary | ICD-10-CM

## 2023-01-29 DIAGNOSIS — M51369 Other intervertebral disc degeneration, lumbar region without mention of lumbar back pain or lower extremity pain: Secondary | ICD-10-CM

## 2023-01-29 DIAGNOSIS — M4004 Postural kyphosis, thoracic region: Secondary | ICD-10-CM

## 2023-01-29 DIAGNOSIS — M19071 Primary osteoarthritis, right ankle and foot: Secondary | ICD-10-CM

## 2023-01-29 DIAGNOSIS — Z8679 Personal history of other diseases of the circulatory system: Secondary | ICD-10-CM

## 2023-01-30 NOTE — Progress Notes (Signed)
Office Visit Note  Patient: Nichole Reyes             Date of Birth: 1950/05/24           MRN: [id number]             PCP: Lianne Moris, PA-C Referring: Lianne Moris, PA-C Visit Date: 02/13/2023 Occupation: @GUAROCC @  Subjective:  Arthralgias  History of Present Illness: Nichole Reyes is a 72 y.o. female with history of osteoarthritis, ILD, and fibromyalgia.  Patient presents today experiencing increased myalgias, arthralgias, and fatigue.  She has been having increased difficulty performing activities that she enjoys due to her pain levels as well as breathlessness from underlying pulmonary fibrosis. Patient remains under the care of Dr. Marchelle Gearing for management of IPF.  She is taking esbriet as prescribed.  She recently had updated pulmonary function testing and is scheduled to follow-up with Dr. Marchelle Gearing to discuss results on 02/20/2023. Patient states that she has increased myofascial pain with weather changes.  She has been taking Tylenol as needed for pain relief but continues to have breakthrough symptoms.  She takes tizanidine as needed for muscle spasms.  Patient has noticed increased pain and stiffness involving her right wrist and hand especially during activities.  She denies any obvious joint swelling.  She has not noticed any symptoms of Raynaud's phenomenon.  She denies any increase skin tightness or thickening.    Activities of Daily Living:  Patient reports morning stiffness for a few minutes    Patient Reports nocturnal pain.  Difficulty dressing/grooming: Denies Difficulty climbing stairs: Reports Difficulty getting out of chair: Reports Difficulty using hands for taps, buttons, cutlery, and/or writing: Reports  Review of Systems  Constitutional:  Positive for fatigue.  HENT:  Positive for mouth dryness. Negative for mouth sores.   Eyes:  Negative for dryness.  Respiratory:  Positive for cough. Negative for shortness of breath.   Cardiovascular:   Positive for palpitations. Negative for chest pain, hypertension and swelling in legs/feet.  Gastrointestinal:  Negative for blood in stool, constipation and diarrhea.  Endocrine: Negative for increased urination.  Genitourinary:  Negative for painful urination.  Musculoskeletal:  Positive for joint pain, joint pain, joint swelling, myalgias, morning stiffness, muscle tenderness and myalgias. Negative for muscle weakness.  Skin:  Negative for color change, rash, hair loss and sensitivity to sunlight.  Neurological:  Negative for dizziness, numbness, headaches and weakness.  Psychiatric/Behavioral: Negative.  Negative for depressed mood and sleep disturbance.     PMFS History:  Patient Active Problem List   Diagnosis Date Noted   Closed fracture of proximal end of left humerus with routine healing 05/19/2022   DVT (deep venous thrombosis) (HCC) 04/21/2022   Acute pulmonary embolism (HCC) 03/17/2022   Type 2 diabetes mellitus (HCC) 03/17/2022   Essential hypertension 03/17/2022   Coronary artery calcification seen on CT scan 03/17/2022   IPF (idiopathic pulmonary fibrosis) (HCC) 09/29/2021   DOE (dyspnea on exertion) 09/29/2021   ILD (interstitial lung disease) (HCC) 08/26/2019   Fibromyalgia syndrome 06/26/2016   Other fatigue 06/26/2016   Other insomnia 06/26/2016   Arthralgia of both knees 06/26/2016   Pain in both feet 06/26/2016   Primary osteoarthritis of both feet 06/26/2016   Primary osteoarthritis of both knees 06/26/2016   DDD (degenerative disc disease), cervical 06/26/2016   DDD (degenerative disc disease), thoracic 06/26/2016   DDD (degenerative disc disease), lumbar 06/26/2016   History of hypertension 06/26/2016   History of high cholesterol 06/26/2016  Other sleep apnea 06/26/2016   History of osteopenia 06/26/2016   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   THYROID NODULE, LEFT 01/03/2008   Hyperlipidemia 01/03/2008   DEPRESSION 01/03/2008   Migraine  headache 01/03/2008   Allergic rhinitis 01/03/2008   Alopecia 01/03/2008   Allergic arthritis 01/03/2008    Past Medical History:  Diagnosis Date   Broken humerus 02/2022   right   Coronary artery calcification seen on CT scan    Essential hypertension    Fibromyalgia    Idiopathic pulmonary fibrosis (HCC)    Mixed hyperlipidemia    Osteoporosis    Type 2 diabetes mellitus (HCC)     Family History  Problem Relation Age of Onset   Hypertension Mother    Diabetes Mother    Osteoporosis Mother    Hypertension Father    Heart disease Father    COPD Father    Colon cancer Sister    Colon cancer Brother    Heart disease Brother    Cancer Brother    Heart disease Brother    Emphysema Paternal Grandfather    Past Surgical History:  Procedure Laterality Date   ABDOMINAL HYSTERECTOMY  2007   CATARACT EXTRACTION Bilateral    Social History   Social History Narrative   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   Immunization History  Administered Date(s) Administered   Fluad Quad(high Dose 65+) 01/29/2019, 12/16/2019, 01/24/2021   Influenza Split 12/18/2017   Influenza-Unspecified 11/18/2013   PFIZER(Purple Top)SARS-COV-2 Vaccination 05/15/2019, 06/13/2019   PNEUMOCOCCAL CONJUGATE-20 03/20/2022   Tdap 05/04/1999     Objective: Vital Signs: BP 134/77 (BP Location: Left Arm, Patient Position: Sitting, Cuff Size: Normal)   Pulse 84   Resp 14   Ht 5\' 5"  (1.651 m)   Wt 170 lb (77.1 kg)   BMI 28.29 kg/m    Physical Exam Vitals and nursing note reviewed.  Constitutional:      Appearance: She is well-developed.  HENT:     Head: Normocephalic and atraumatic.  Eyes:     Conjunctiva/sclera: Conjunctivae normal.  Cardiovascular:     Rate and Rhythm: Normal rate and regular rhythm.     Heart sounds: Normal heart sounds.  Pulmonary:     Effort: Pulmonary effort is normal.     Breath sounds: Normal breath sounds.  Abdominal:     General: Bowel sounds are  normal.     Palpations: Abdomen is soft.  Musculoskeletal:     Cervical back: Normal range of motion.  Lymphadenopathy:     Cervical: No cervical adenopathy.  Skin:    General: Skin is warm and dry.     Capillary Refill: Capillary refill takes less than 2 seconds.  Neurological:     Mental Status: She is alert and oriented to person, place, and time.  Psychiatric:        Behavior: Behavior normal.      Musculoskeletal Exam: C-spine has limited ROM with lateral rotation.  Thoracic arthrosis noted.  Limited mobility of the lumbar spine.  Shoulder joints, elbow joints, wrist joints, MCPs, PIPs, DIPs have good range of motion with no synovitis.  PIP and DIP thickening. Complete fist formation bilaterally.  Hip joints have good range of motion with no groin pain.  Knee joints have good range of motion with no warmth or effusion.  Ankle joints have good range of motion with no tenderness or joint swelling.  CDAI Exam: CDAI Score: -- Patient Global: --; Provider Global: --  Swollen: --; Tender: -- Joint Exam 02/13/2023   No joint exam has been documented for this visit   There is currently no information documented on the homunculus. Go to the Rheumatology activity and complete the homunculus joint exam.  Investigation: No additional findings.  Imaging: No results found.  Recent Labs: Lab Results  Component Value Date   WBC 9.8 10/03/2022   HGB 13.2 10/03/2022   PLT 397 10/03/2022   NA 139 10/03/2022   K 4.3 10/03/2022   CL 106 10/03/2022   CO2 25 10/03/2022   GLUCOSE 184 (H) 10/03/2022   BUN 12 10/03/2022   CREATININE 0.81 10/03/2022   BILITOT 0.5 10/03/2022   ALKPHOS 79 10/03/2022   AST 20 10/03/2022   ALT 17 10/03/2022   PROT 7.2 10/03/2022   ALBUMIN 3.7 10/03/2022   CALCIUM 9.7 10/03/2022   GFRAA 101 03/15/2017    Speciality Comments: No specialty comments available.  Procedures:  No procedures performed Allergies: Latex, Pseudoephedrine, Sulfa antibiotics,  and Sulfonamide derivatives     Assessment / Plan:     Visit Diagnoses: Polyarthralgia -Anti-CCP negative, RF negative, 14 3 3  eta negative, and ESR WNL on 05/02/21: Patient presents today experiencing increased arthralgias, joint stiffness, and fatigue.  She has been having increased difficulty performing ADLs as well as activities that she enjoys.  She has had increased pain and stiffness involving the right wrist and hand especially with activity.  No synovitis was noted on examination today.  Given family history of rheumatoid arthritis, history of IPF, and increased arthralgias the following lab work will be obtained today for further evaluation.  If her symptoms persist or worsen I would recommend proceeding with an ultrasound to evaluate for synovitis.  She was in agreement.  Plan: COMPLETE METABOLIC PANEL WITH GFR, CBC with Differential/Platelet, Sedimentation rate, C-reactive protein, Rheumatoid factor, Cyclic citrul peptide antibody, IgG, ANA, Anti-scleroderma antibody, RNP Antibody, Anti-Smith antibody, Sjogrens syndrome-A extractable nuclear antibody, Sjogrens syndrome-B extractable nuclear antibody, Anti-DNA antibody, double-stranded, C3 and C4  Family history of rheumatoid arthritis: Maternal grandmother. Plan to check rheumatoid factor and anti-CCP today.  ILD (interstitial lung disease) (HCC): Dyspnea started summer 2020.  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  Followed  by Dr. Marchelle Gearing. Reviewed Dr. Jane Canary office visit note from 09/05/22-symptoms, exercise hypoxemia test, and radiology--stable.    She remains on Esbriet as prescribed.  Plan to update CBC and CMP today. PFTs updated on 01/19/23. Upcoming appointment 02/20/23 to discuss results.  Lab work from 11/19/2018 was reviewed today in the office: ANA negative, RF negative, anti-CCP negative, SCL 70 negative, aldolase within normal limits, CRP within normal limits, Jo 1 antibody negative, ANCA  negative, and dsDNA negative.  No symptoms of Raynaud's phenomenon, skin tightness, or skin thickening noted. No synovitis apparent on exam. Plan to check autoimmune lab work today considering she is experiencing increase arthralgias and fatigue.    Medication monitoring encounter - Plan to check the following lab work today.  Plan: COMPLETE METABOLIC PANEL WITH GFR, CBC with Differential/Platelet  Primary osteoarthritis of both knees: MRI of right knee 08/25/22: complete tear of the anterior horn body of the lateral meniscus. A small radial tear of the free edge of the lateral meniscus was also noted.  She has been experiencing intermittent mechanical symptoms and discomfort involving the right knee.  She has not yet followed up with orthopedist since her symptoms have been manageable.  No effusion noted on examination today. Advised to follow  up with Dr. Romeo Apple if her symptoms persist or worsen.  Primary osteoarthritis of both feet: Good range of motion of both ankle joints with no tenderness or synovitis.  DDD (degenerative disc disease), cervical: C-spine has limited ROM with lateral rotation.   Postural kyphosis of thoracic region: Thoracic kyphosis noted  DDD (degenerative disc disease), thoracic: Thoracic kyphosis noted.  Degeneration of intervertebral disc of lumbar region without discogenic back pain or lower extremity pain: Chronic pain.  She has been experiencing increased discomfort in her lower back to the point that she has to take breaks during activities.  Offered a referral to physical therapy but she has declined at this time.  Recommended water exercises or water therapy.   Fibromyalgia: Patient has generalized hyperalgesia and positive tender points on exam.  Patient continues to experience myofascial pain which is exacerbated by weather changes.  Discussed that she may benefit from water aerobics or water therapy.  Also discussed the option of physical therapy but she has  declined at this time.  Other fatigue -The following lab work will be updated today.   Plan: COMPLETE METABOLIC PANEL WITH GFR, CBC with Differential/Platelet, Sedimentation rate, C-reactive protein, Rheumatoid factor, Cyclic citrul peptide antibody, IgG, ANA, Anti-scleroderma antibody, RNP Antibody, Anti-Smith antibody, Sjogrens syndrome-A extractable nuclear antibody, Sjogrens syndrome-B extractable nuclear antibody, Anti-DNA antibody, double-stranded, C3 and C4  Other insomnia: Patient has difficulty sleeping at night due to nocturnal pain.  Osteopenia of multiple sites:  DEXA updated on 08/12/19: L1-L4 BMD 0.813 with T-score -2.1. DXA ordered by PCP. Thoracic kyphosis noted. No history of fractures.   Other medical conditions are listed as follows:  History of hypertension: Blood pressure is 134/77 today in the office.  History of hypercholesterolemia  History of migraine  History of sleep apnea  History of depression   Orders: Orders Placed This Encounter  Procedures   COMPLETE METABOLIC PANEL WITH GFR   CBC with Differential/Platelet   Sedimentation rate   C-reactive protein   Rheumatoid factor   Cyclic citrul peptide antibody, IgG   ANA   Anti-scleroderma antibody   RNP Antibody   Anti-Smith antibody   Sjogrens syndrome-A extractable nuclear antibody   Sjogrens syndrome-B extractable nuclear antibody   Anti-DNA antibody, double-stranded   C3 and C4   No orders of the defined types were placed in this encounter.  Follow-Up Instructions: Return in about 6 months (around 08/13/2023) for Osteoarthritis, Fibromyalgia.   Gearldine Bienenstock, PA-C  Note - This record has been created using Dragon software.  Chart creation errors have been sought, but may not always  have been located. Such creation errors do not reflect on  the standard of medical care.

## 2023-02-08 ENCOUNTER — Ambulatory Visit: Payer: Medicare Other | Admitting: Physical Therapy

## 2023-02-13 ENCOUNTER — Ambulatory Visit: Payer: Medicare Other | Attending: Physician Assistant | Admitting: Physician Assistant

## 2023-02-13 ENCOUNTER — Encounter: Payer: Self-pay | Admitting: Physician Assistant

## 2023-02-13 VITALS — BP 134/77 | HR 84 | Resp 14 | Ht 65.0 in | Wt 170.0 lb

## 2023-02-13 DIAGNOSIS — G4709 Other insomnia: Secondary | ICD-10-CM | POA: Diagnosis not present

## 2023-02-13 DIAGNOSIS — M4004 Postural kyphosis, thoracic region: Secondary | ICD-10-CM | POA: Diagnosis not present

## 2023-02-13 DIAGNOSIS — Z8669 Personal history of other diseases of the nervous system and sense organs: Secondary | ICD-10-CM

## 2023-02-13 DIAGNOSIS — M5134 Other intervertebral disc degeneration, thoracic region: Secondary | ICD-10-CM | POA: Diagnosis not present

## 2023-02-13 DIAGNOSIS — Z8679 Personal history of other diseases of the circulatory system: Secondary | ICD-10-CM

## 2023-02-13 DIAGNOSIS — M19071 Primary osteoarthritis, right ankle and foot: Secondary | ICD-10-CM

## 2023-02-13 DIAGNOSIS — M170 Bilateral primary osteoarthritis of knee: Secondary | ICD-10-CM | POA: Diagnosis not present

## 2023-02-13 DIAGNOSIS — M5030 Other cervical disc degeneration, unspecified cervical region: Secondary | ICD-10-CM

## 2023-02-13 DIAGNOSIS — M51369 Other intervertebral disc degeneration, lumbar region without mention of lumbar back pain or lower extremity pain: Secondary | ICD-10-CM

## 2023-02-13 DIAGNOSIS — M2550 Pain in unspecified joint: Secondary | ICD-10-CM | POA: Diagnosis not present

## 2023-02-13 DIAGNOSIS — Z5181 Encounter for therapeutic drug level monitoring: Secondary | ICD-10-CM

## 2023-02-13 DIAGNOSIS — J849 Interstitial pulmonary disease, unspecified: Secondary | ICD-10-CM

## 2023-02-13 DIAGNOSIS — M8589 Other specified disorders of bone density and structure, multiple sites: Secondary | ICD-10-CM

## 2023-02-13 DIAGNOSIS — Z8659 Personal history of other mental and behavioral disorders: Secondary | ICD-10-CM

## 2023-02-13 DIAGNOSIS — M797 Fibromyalgia: Secondary | ICD-10-CM | POA: Diagnosis not present

## 2023-02-13 DIAGNOSIS — Z8261 Family history of arthritis: Secondary | ICD-10-CM | POA: Diagnosis not present

## 2023-02-13 DIAGNOSIS — M19072 Primary osteoarthritis, left ankle and foot: Secondary | ICD-10-CM

## 2023-02-13 DIAGNOSIS — R5383 Other fatigue: Secondary | ICD-10-CM

## 2023-02-13 DIAGNOSIS — Z8639 Personal history of other endocrine, nutritional and metabolic disease: Secondary | ICD-10-CM

## 2023-02-14 NOTE — Progress Notes (Signed)
Glucose is 117. Rest of CMP WNL  Platelet count is elevated-493K. Absolute monocytes are borderline elevated. Rest of CBC WNL. We will continue to monitor.  ESR WNL  Complements WNL

## 2023-02-16 LAB — COMPLETE METABOLIC PANEL WITH GFR
AG Ratio: 1.4 (calc) (ref 1.0–2.5)
ALT: 19 U/L (ref 6–29)
AST: 20 U/L (ref 10–35)
Albumin: 4.1 g/dL (ref 3.6–5.1)
Alkaline phosphatase (APISO): 80 U/L (ref 37–153)
BUN: 15 mg/dL (ref 7–25)
CO2: 28 mmol/L (ref 20–32)
Calcium: 9.7 mg/dL (ref 8.6–10.4)
Chloride: 104 mmol/L (ref 98–110)
Creat: 0.79 mg/dL (ref 0.60–1.00)
Globulin: 2.9 g/dL (ref 1.9–3.7)
Glucose, Bld: 117 mg/dL — ABNORMAL HIGH (ref 65–99)
Potassium: 4.6 mmol/L (ref 3.5–5.3)
Sodium: 141 mmol/L (ref 135–146)
Total Bilirubin: 0.3 mg/dL (ref 0.2–1.2)
Total Protein: 7 g/dL (ref 6.1–8.1)
eGFR: 80 mL/min/{1.73_m2} (ref >=60)

## 2023-02-16 LAB — CBC WITH DIFFERENTIAL/PLATELET
Absolute Lymphocytes: 2406 {cells}/uL (ref 850–3900)
Absolute Monocytes: 960 {cells}/uL — ABNORMAL HIGH (ref 200–950)
Basophils Absolute: 109 {cells}/uL (ref 0–200)
Basophils Relative: 1.1 %
Eosinophils Absolute: 218 {cells}/uL (ref 15–500)
Eosinophils Relative: 2.2 %
HCT: 40.3 % (ref 35.0–45.0)
Hemoglobin: 13.2 g/dL (ref 11.7–15.5)
MCH: 30.5 pg (ref 27.0–33.0)
MCHC: 32.8 g/dL (ref 32.0–36.0)
MCV: 93.1 fL (ref 80.0–100.0)
MPV: 10.2 fL (ref 7.5–12.5)
Monocytes Relative: 9.7 %
Neutro Abs: 6207 {cells}/uL (ref 1500–7800)
Neutrophils Relative %: 62.7 %
Platelets: 493 10*3/uL — ABNORMAL HIGH (ref 140–400)
RBC: 4.33 10*6/uL (ref 3.80–5.10)
RDW: 12.2 % (ref 11.0–15.0)
Total Lymphocyte: 24.3 %
WBC: 9.9 10*3/uL (ref 3.8–10.8)

## 2023-02-16 LAB — CYCLIC CITRUL PEPTIDE ANTIBODY, IGG: Cyclic Citrullin Peptide Ab: <16 U

## 2023-02-16 LAB — C-REACTIVE PROTEIN: CRP: 14.9 mg/L — ABNORMAL HIGH (ref <8.0)

## 2023-02-16 LAB — SJOGRENS SYNDROME-A EXTRACTABLE NUCLEAR ANTIBODY: SSA (Ro) (ENA) Antibody, IgG: <1 AI

## 2023-02-16 LAB — ANTI-SCLERODERMA ANTIBODY: Scleroderma (Scl-70) (ENA) Antibody, IgG: <1 AI

## 2023-02-16 LAB — ANTI-SMITH ANTIBODY: ENA SM Ab Ser-aCnc: <1 AI

## 2023-02-16 LAB — ANTI-DNA ANTIBODY, DOUBLE-STRANDED: ds DNA Ab: <1 [IU]/mL

## 2023-02-16 LAB — RNP ANTIBODY: Ribonucleic Protein(ENA) Antibody, IgG: <1 AI

## 2023-02-16 LAB — SEDIMENTATION RATE: Sed Rate: 29 mm/h (ref 0–30)

## 2023-02-16 LAB — C3 AND C4
C3 Complement: 186 mg/dL (ref 83–193)
C4 Complement: 21 mg/dL (ref 15–57)

## 2023-02-16 LAB — ANA: Anti Nuclear Antibody (ANA): NEGATIVE

## 2023-02-16 LAB — SJOGRENS SYNDROME-B EXTRACTABLE NUCLEAR ANTIBODY: SSB (La) (ENA) Antibody, IgG: <1 AI

## 2023-02-16 LAB — RHEUMATOID FACTOR: Rheumatoid fact SerPl-aCnc: <10 [IU]/mL (ref <14)

## 2023-02-18 NOTE — Progress Notes (Signed)
CRP remains elevated.  RF negative  Anti-CCP negative  ANA negative. Scl-70 negative.   dsDNA negative  RNP negative. Ro and La antibodies negative.   Overall workup is unremarkable--if she continues to have ongoing pain and intermittent swelling in her hands-ok to schedule ultrasound for further evaluation

## 2023-02-19 ENCOUNTER — Encounter: Payer: Medicare Other | Admitting: Physical Therapy

## 2023-02-20 ENCOUNTER — Ambulatory Visit: Payer: Medicare Other | Admitting: Internal Medicine

## 2023-02-20 ENCOUNTER — Encounter: Payer: Self-pay | Admitting: Internal Medicine

## 2023-02-20 VITALS — BP 116/74 | HR 74 | Ht 65.0 in | Wt 170.0 lb

## 2023-02-20 DIAGNOSIS — R0609 Other forms of dyspnea: Secondary | ICD-10-CM

## 2023-02-20 DIAGNOSIS — J84112 Idiopathic pulmonary fibrosis: Secondary | ICD-10-CM | POA: Diagnosis not present

## 2023-02-20 DIAGNOSIS — Z86711 Personal history of pulmonary embolism: Secondary | ICD-10-CM | POA: Diagnosis not present

## 2023-02-20 DIAGNOSIS — Z5181 Encounter for therapeutic drug level monitoring: Secondary | ICD-10-CM | POA: Diagnosis not present

## 2023-02-20 NOTE — Progress Notes (Signed)
OV 11/19/2018: Nichole Reyes is a 72 year old woman referred for evaluation of dyspnea for the past several months.  In the same time interval she is also noticed fatigue.  She has previously undergone a negative cardiac evaluation and an outside CT scan of her chest.  Per report, the CT demonstrates fibrotic changes. She is a never- smoker.  She has previously been evaluated by rheumatology, Dr. Corliss Skains, for fibromyalgia and osteoarthritis.  She began to notice dyspnea on exertion when exercising in February 2020.  She had previously been able to walk 35 to 40 minutes, but decreased to about 20 minutes at a time due to dyspnea.  She was hospitalized in [place] in June 2020 for dyspnea. At that time she was COVID negative.  She was discharged on albuterol, which she has been using up to 4 times a day, which improves her symptoms.  There is no family history of fibrotic lung disease; multiple family members had COPD.  Her maternal grandmother had rheumatoid arthritis, but otherwise there is no family history of rheumatologic disease.  In addition to dyspnea on exertion with occasional dyspnea at rest she endorses cough and sputum production.  She denies wheezing, postnasal drip, coughing or choking when eating, Raynaud's, muscle weakness, rashes, dry eyes or mouth, joint swelling, prolonged morning stiffness.  She has occasional hand stiffness throughout the day, not associated with rest.  She has a hiatal hernia with occasional GERD with symptoms less than once a week, associated with certain foods.  She is a never smoker or vaper.  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  She has never worked in [employer] or shipyards.  She does not have any dusty hobbies.  She has a Development worker, international aid, but has never had pet birds.  She does not regularly use hot tubs.  She has never had exposure to medical radiation, methotrexate,  Macrobid, amiodarone, bleomycin.  Her only new medication was Zetia last month for hypertriglyceridemia.  Otherwise she has not had medication changes in greater than a year.  Last month she has had 6 episodes of epistaxis, which has improved with use of intranasal steroids prescribed by her PCP.     OV 05/06/2019  Subjective:  Patient ID: Nichole Reyes, female , DOB: Jul 19, 1950 , age 72 y.o. , MRN: [id number] , ADDRESS: [address] [place] [address]   05/06/2019 -   Chief Complaint  Patient presents with   Follow-up    Pt states she has both good days and bad days with breathing. Pt states she will occ have to use her rescue inhaler at least 1-2 times with activities.     HPI Nichole Reyes 72 y.o. -referred by Dr. 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  She was first seen by Dr. Chestine Spore in September 2020 noted above.  She followed up in December 2020 and had pulmonary function test.  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  Due to insurance delays she has not started the nintedanib yet.  She is wondering about this.  She has specific questions about taking Covid vaccine.  She is also worried about her significant cough.  Review shows she is on ACE inhibitor.  She has a tickle in the throat.  She has acid reflux and is on Prilosec.  She has a history  of hiatal hernia.  She is taken a decision on starting nintedanib but she is interested in hearing about the other antifibrotic pirfenidone because of insurance delay she has not started on anything as yet.   [facility] Integrated Comprehensive ILD Questionnaire  Symptoms:  -Insidious onset of shortness of breath 7 months ago.  Since it started it has been the same.  She also has episodic amount of dyspnea.  The severity classification is below.  She does not know when her cough started but she does have a cough associated with clearing of the throat.  It is  moderate in intensity.  She does cough at night.  She does bring up phlegm.  The phlegm is usually white but sometimes yellow.  She does feel a tickle in her throat but does not wake up in the middle of the night because of the cough.  There is no wheezing.  Cough is associated with ACE inhibitor intake.    Past Medical History : Positive for fibromyalgia, hiatal hernia, sleep apnea both for the last few years.  There is also borderline diabetes..  And hyperlipidemia   she denies any asthma or COPD or heart failure rheumatoid arthritis or scleroderma or lupus.  Denies any polymyositis.  Denies Sjogren's.  Denies HIV.  Denies pulmonary hypertension.  Denies thyroid disease or stroke.  Denies seizures or mononucleosis or hepatitis or tuberculosis.  Denies kidney disease.  Denies pneumonia.  Denies history of blood clots or heart disease or pleurisy.  Autoimmune serology sept 2020  - normal Results for Nichole Reyes (MRN [id number]) as of 05/06/2019 12:01  Ref. Range 11/19/2018 14:58  SEE BELOW Unknown Comment  Anti Nuclear Antibody (ANA) Latest Ref Range: Negative  Negative  Anti JO-1 Latest Ref Range: 0.0 - 0.9 AI <0.2  CENTROMERE AB SCREEN Latest Ref Range: 0.0 - 0.9 AI <0.2  dsDNA Ab Latest Ref Range: 0 - 9 IU/mL <1  ENA RNP Ab Latest Ref Range: 0.0 - 0.9 AI <0.2  ENA SSA (RO) Ab Latest Ref Range: 0.0 - 0.9 AI <0.2  ENA SSB (LA) Ab Latest Ref Range: 0.0 - 0.9 AI <0.2  ENA SM Ab Ser-aCnc Latest Ref Range: 0.0 - 0.9 AI <0.2  Chromatin Ab SerPl-aCnc Latest Ref Range: 0.0 - 0.9 AI <0.2  Anti-Jo-1 Ab (RDL) Latest Ref Range: <20 Units <20  Anti-PL-7 Ab (RDL) Unknown CANCELED  Anti-PL-12 Ab (RDL) Unknown CANCELED  Anti-EJ Ab (RDL) Unknown CANCELED  Anti-OJ Ab (RDL) Unknown CANCELED  Anti-SRP Ab (RDL) Unknown CANCELED  Anti-Mi-2 Ab (RDL) Unknown CANCELED  Anti-TIF-1gamma Ab (RDL) Latest Units: Units CANCELED  Anti-MDA-5 Ab (CADM-140)(RDL) Latest Units: Units CANCELED  Anti-NXP-2 (P140) Ab  (RDL) Latest Units: Units CANCELED  Anti-SAE1 Ab, IgG (RDL) Latest Units: Units CANCELED  Anti-PM/Scl-100 Ab (RDL) Latest Ref Range: <20 Units <20  Anti-Ku Ab (RDL) Unknown CANCELED  Anti-SS-A 52kD Ab, IgG (RDL) Latest Ref Range: <20 Units <20  Anti-U1 RNP Ab (RDL) Latest Units: Units CANCELED    ROS: She has fatigue for the last several years and also arthralgia.  She attributes this to fibromyalgia.  She also has pain in her fingers which she attributes due to fibromyalgia.  She has heartburn from hiatal hernia she says.  She also snores because of her sleep apnea and she has a rash related to eczema.  But otherwise negative.   FAMILY HISTORY of LUNG DISEASE: She thinks her dad might have had COPD and her grandfather.  However nobody with pulmonary fibrosis  or other lung diseases.   EXPOSURE HISTORY: Denies ever having smoked cigarettes although she grew up in a home with her dad smoked.  Therefore positive for passive smoking as a child.  Denies cigar use.  Denies marijuana use denies cocaine use denies IV drug use.  Denies vaping.   HOME and HOBBY DETAILS : [place]  She is lived in this home for 22 years.  The home is 70 years old.  She has noticed mildew periodically in the shower curtain and in the bathroom.  She does use a CPAP mask but there is no water circuit mold or mildew in it.  She does do gardening with the flower garden she does use a steam iron but there is no mold or mildew in it.  There is no pet birds.  No feather pillow or duvet.  No pet gerbils.  No mold in the [facility] duct.  Does not play any wind instruments.  Does not use a humidifier.  Does not have a Jacuzzi in the house.   OCCUPATIONAL HISTORY (122 questions) : Essentially negative.  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   PULMONARY TOXICITY HISTORY (27 items): Negative except prednisone use    Results for BRAYLA, RHOAD (MRN [id number]) as of 05/06/2019 12:01  Ref. Range 11/19/2018 but ? Done 02/28/2019 14:57  FVC-Pre Latest Units: L 2.29  FVC-%Pred-Pre Latest Units: % 70   Results for PHALYN, WAS (MRN [id number]) as of 05/06/2019 12:01  Ref. Range 11/19/2018 but ? Done 02/28/2019 14:57  DLCO unc Latest Units: ml/min/mmHg 15.75  DLCO unc % pred Latest Units: % 77    She had a high-resolution CT chest September 24, 2018 at [facility].  This was read by our radiologist Dr Lauralyn Primes..  I do not have the image to visualized but I trust this report.  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  In addition is described a 1.4 cm right upper lobe posterior segment inferior part nodule that is subpleural and also 8 mm subpleural right middle lobe nodule.  He says these findings are unchanged compared to 2008 and are benign.  OV 06/03/2019 -telephone visit.  Patient identified with 2 person identifier.  Risks, benefits and limitations of telephone visit explained.  Subjective:  Patient ID: Nichole Reyes, female , DOB: Apr 02, 1950 , age 32 y.o. , MRN: [id number] , ADDRESS: [address] [place] [address]   Clinical IPF diagnosis given dec 2020 and reiterated Mid Feb 2021:  based on your age greater than 65, clubbing on exam, CT scan with high probability for this condition, negative history on your questionnaire and negative blood test on your serology exams   06/03/2019 -  Fu IPF - focus on esbriet monitoring and any other questions she might have   HPI Nichole Reyes 72 y.o. - on Esbriet now. Started it 05/22/2019 Thursday. Currently on 2 pills tid. Taking it with food. Spacing 5-6h between dosing. No side effects at this point.  From a dyspnea standpoint she is stable.  She had questions about being able to do gardening.  We discussed the fact this could cause mold  exposure and organic dust exposure that could irritate her IPF.  She says she can do it with  masking.  Gardening gives her purpose so I supported it.  She also plan [place] last week and was quite short of breath and took an inhaler.  I have suggested that she use a pulse ox meter and monitor her pulse ox and call us if her pulse ox below 88% so we can order portable oxygen [obviously will have to do stress testing submaximal 6-minute walk test] otherwise doing well.    OV 12/16/2019   Subjective:  Patient ID: Nichole Reyes, female , DOB: January 19, 1951, age 26 y.o. years. , MRN: [id number],  ADDRESS: [address] [place] [address] PCP  Selinda Flavin, MD Providers : Treatment Team:  Attending Provider: Kalman Shan, MD   Chief Complaint  Patient presents with   Follow-up    Tired all the time. redness/itching to bilateral nick and insides of  arms.  stress/heat?     Clinical IPF diagnosis given dec 2020 and reiterated Mid Feb 2021:  based on your age greater than 65, clubbing on exam, CT scan with high probability for this condition, negative history on your questionnaire and negative blood test on your serology exams . - on Esbriet now. Started it 05/22/2019   GERD/Hiatal Hernia  HPI Nichole Reyes 72 y.o. -returns for follow-up.  Last seen in early part of 2021.  After that she see nurse practitioner 2 times once or at least telephone visit.  She tried it in pulmonary rehabilitation for IPF but she had logistical issues.  Early morning she has to get her husband ready for work.  Late in the afternoon she has to pick up her granddaughter.  She also has knee issues from a fibromyalgia.  Therefore is not able to do pulmonary rehabilitation.  She is very compliant with her pirfenidone.  She feels she is tolerating it fine but she did admit to low appetite and also has a 7 pound weight loss.  She is also fatigued.  She thinks the fatigue is present for the last few months.  She  thinks it might be related to her daughter's wedding but then the fatigue has been there for the last few months while the daughter's wedding happened a few weeks ago.  She is not on oxygen.  Walking desaturation test is stable.  Most recent pulmonary function test is stable.  Symptom score other than fatigue is also stable.  She continues PPI for her acid reflux.      OV 05/06/2020  Subjective:  Patient ID: Nichole Reyes, female , DOB: 1950/12/25 , age 56 y.o. , MRN: [id number] , ADDRESS: [address] [place] [address] PCP Selinda Flavin, MD Patient Care Team: Selinda Flavin, MD as PCP - General (Family Medicine) Laqueta Linden, MD (Inactive) as PCP - Cardiology (Cardiology)  This Provider for this visit: Treatment Team:  Attending Provider: Kalman Shan, MD    05/06/2020 -   Chief Complaint  Patient presents with   Follow-up    SOB with exertion    Clinical IPF diagnosis given dec 2020 and reiterated Mid Feb 2021:  based on your age greater than 65, clubbing on exam, CT scan with high probability for this condition, negative history on your questionnaire and negative blood test on your serology exams . - on Esbriet now. Started it 05/22/2019   GERD/Hiatal Hernia  Last CT scan of the chest July 2020 at [facility] with probable UIP Last PFT June 2021  HPI Nichole Reyes 72 y.o. -presents for follow-up.  Last  seen in September 2021.  Since then she continues to deal with her right knee issue.  She is seen Dr. Pollyann Savoy for this.  Apparently a knee MRI is pending.  She is continuing to lose weight.  She has lost 10 pounds in the last 1 year.  This correlates with starting pirfenidone.  Technically she is still overweight.  She is also reporting associated fatigue and metallic taste.  A few months ago she made a call and we told her to hold the pirfenidone.  After that the metallic taste largely resolved but it does come back although to a much lesser severity.   At this point in time it does not bother her too much so she think she will continue to take pirfenidone.  From a respiratory standpoint she feels stable although she would say that in the last 1 year she feels the disease might be slowly progressive.  Her symptom score shows slight worsening especially with shortness of breath and weight loss but same fatigue.  Some nausea with pirfenidone.  He was supposed to pulmonary function testing today but for some reason has not happened.  Last PFT was June 2021.  Last CT scan of the chest was July 2020 I do not have echocardiogram in our system.  She did have a low risk nuclear medicine stress test with 59% in June 2020   OV 07/13/2020  Subjective:  Patient ID: Nichole Reyes, female , DOB: June 26, 1950 , age 80 y.o. , MRN: [id number] , ADDRESS: [address] [place] [address] PCP Lianne Moris, PA-C Patient Care Team: Lianne Moris, PA-C as PCP - General (Family Medicine) Laqueta Linden, MD (Inactive) as PCP - Cardiology (Cardiology)  This Provider for this visit: Treatment Team:  Attending Provider: Kalman Shan, MD    07/13/2020 -   Chief Complaint  Patient presents with   Follow-up    No concerns, here to get PFT results.    Clinical IPF diagnosis given dec 2020 and reiterated Mid Feb 2021:  based on your age greater than 65, clubbing on exam, CT scan with high probability for this condition, negative history on your questionnaire and negative blood test on serology exams . - Oon Esbriet since 05/22/2019   GERD/Hiatal Hernia  Last PFT April 2022 Last CT scan of the chest March 2022   HPI Nichole Reyes 72 y.o. -returns for follow-up to discuss her test results.  She definitely has more shortness of breath now than she did in September 2021.  She continues to lose weight.  But her BMI is still 27.  No metallic taste no nausea vomiting or diarrhea.  No chest pains.  Her pulmonary function test shows slight decline in DLCO but  FVC is stable.  Walking desaturation to stable.  High-resolution CT chest shows stability in ILD compared to 2020.  Therefore overall she is probably stable.  There is evidence of coronary artery calcification and also aortic valve calcifications on the CT.  Her echo showed mild diastolic dysfunction that could be contributing to the dyspnea.  There is also mild aortic valve sclerosis.  There is no chest pain with exertion.  Her last cardiologist in [place] is no longer in the health system.  She is not seen a cardiologist in 7 years.  Does not recollect when her last stress test was.   WE discussed   1. Scientific Purpose  Clinical research is designed to produce generalizable knowledge and to answer questions about the safety  and efficacy of intervention(s) under study in order to determine whether or not they may be useful for the care of future patients.  2. Study Procedures  Participation in a trial may involve procedures or tests, in addition to the intervention(s) under study, that are intended only or primarily to generate scientific knowledge and that are otherwise not necessary for patient care.   3. Uncertainty  For intervention(s) under study in clinical research, there often is less knowledge and more uncertainty about the risks and benefits to a population of trial participants than there is when a doctor offers a patient standard interventions.   4. Adherence to Protocol  Administration of the intervention(s) under study is typically based on a strict protocol with defined dose, scheduling, and use or avoidance of concurrent medications, compared to administration of standard interventions.  5. Clinician as Investigator  Clinicians who are in health care settings provide treatment; in a clinical trial setting, they are also investigating safety and efficacy of an intervention. In otherwise your doctor or nurse practitioner can be wearing 2 hats - one as care giver another as  Oceanographer  6. Patient as Engineer, agricultural Subject  Patients participating in research trials are research subjects or volunteers. In other words participating in research is 100% voluntary and at one's own free weill. The decision to participate or not participate will NOT affect patient care and the doctor-patient relationship in any way   ECHO 06/29/20  IMPRESSIONS     1. Left ventricular ejection fraction, by estimation, is 60 to 65%. The  left ventricle has normal function. The left ventricle has no regional  wall motion abnormalities. There is mild left ventricular hypertrophy.  Left ventricular diastolic parameters  are consistent with Grade I diastolic dysfunction (impaired relaxation).   2. Right ventricular systolic function is normal. The right ventricular  size is normal. There is normal pulmonary artery systolic pressure. The  estimated right ventricular systolic pressure is 25.8 mmHg.   3. The mitral valve is grossly normal. Trivial mitral valve  regurgitation.   4. The aortic valve is tricuspid. Aortic valve regurgitation is not  visualized. Mild aortic valve sclerosis is present, with no evidence of  aortic valve stenosis.   5. The inferior vena cava is normal in size with greater than 50%  respiratory variability, suggesting right atrial pressure of 3 mmHg.   Comparison(s): No prior Echocardiogram.    CT Chest data march 09811  Narrative & Impression  CLINICAL DATA:  72 year old female with history of chronic dyspnea. Fibrotic changes noted on prior CT examination. Follow-up study.   EXAM: CT CHEST WITHOUT CONTRAST   TECHNIQUE: Multidetector CT imaging of the chest was performed following the standard protocol without intravenous contrast. High resolution imaging of the lungs, as well as inspiratory and expiratory imaging, was performed.   COMPARISON:  No priors available for comparison.   FINDINGS: Cardiovascular: Heart size is normal.  There is no significant pericardial fluid, thickening or pericardial calcification. There is aortic atherosclerosis, as well as atherosclerosis of the great vessels of the mediastinum and the coronary arteries, including calcified atherosclerotic plaque in the left main, left anterior descending, left circumflex and right coronary arteries. Calcifications of the aortic valve.   Mediastinum/Nodes: No pathologically enlarged mediastinal or hilar lymph nodes. Please note that accurate exclusion of hilar adenopathy is limited on noncontrast CT scans. Esophagus is unremarkable in appearance. No axillary lymphadenopathy.   Lungs/Pleura: High-resolution images demonstrate widespread areas of peripheral predominant ground-glass attenuation,  septal thickening, mild cylindrical bronchiectasis and peripheral bronchiolectasis. Findings have a mild craniocaudal gradient. No definitive honeycombing is identified. Inspiratory and expiratory imaging is unremarkable. No acute consolidative airspace disease. No pleural effusions. In the periphery of the right middle lobe (axial image 89 of series 6) there is a 7 x 5 mm (mean diameter of 6 mm) subpleural pulmonary nodule (stable compared to prior study 09/24/2018). In the superior segment of the right lower lobe (axial image 73 of series 6) there is a 2.3 x 0.8 cm elongated nodular area of architectural distortion which is intimately associated with an accessory fissure, which appears slightly larger on axial images, but is completely stable in size and appearance when viewed on sagittal reconstructions.   Upper Abdomen: Aortic atherosclerosis.   Musculoskeletal: There are no aggressive appearing lytic or blastic lesions noted in the visualized portions of the skeleton.   IMPRESSION: 1. 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 Repeat high-resolution chest CT is recommended in 12 months to assess for temporal changes in the appearance of the lung parenchyma. 2. Pulmonary nodules in the right lung are stable compared to the prior study from 2020, likely benign. Attention at time of repeat high-resolution chest CT is recommended to ensure continued stability. 3. Aortic atherosclerosis, in addition to left main and 3 vessel coronary artery disease. Please note that although the presence of coronary artery calcium documents the presence of coronary artery disease, the severity of this disease and any potential stenosis cannot be assessed on this non-gated CT examination. Assessment for potential risk factor modification, dietary therapy or pharmacologic therapy may be warranted, if clinically indicated. 4. There are calcifications of the aortic valve. Echocardiographic correlation for evaluation of potential valvular dysfunction may be warranted if clinically indicated.   Aortic Atherosclerosis (ICD10-I70.0).   Electronically Signed: By: Trudie Reed M.D. On: 06/01/2020 09:51  ADDENDUM REPORT: 06/21/2020 14:33   ADDENDUM: It was brought to my attention that there is a prior chest CT from 09/24/2018. After comparison between the 05/31/2020 exam and the 09/24/2018 exam, there is no evidence of progression. Assessment remains unchanged. Specifically, based on the spectrum of findings this continues to be categorized as probable usual interstitial pneumonia (UIP) per current ATS guidelines.     Electronically Signed   By: Trudie Reed M.D.   On: 06/21/2020 14:33        OV 01/24/2021  Subjective:  Patient ID: Nichole Reyes, female , DOB: 1950-10-27 , age 72 y.o. , MRN: [id number] , ADDRESS: [address] [place] [address] PCP Lianne Moris, PA-C Patient Care Team: Lianne Moris, PA-C as PCP - General (Family Medicine) Jonelle Sidle, MD as PCP - Cardiology  (Cardiology)  This Provider for this visit: Treatment Team:  Attending Provider: Kalman Shan, MD    01/24/2021 -   Chief Complaint  Patient presents with   Follow-up    F/U after PFT. States her breathing has been stable since last visit with SOB with exertion.        HPI Nichole Reyes 72 y.o. -returns for follow-up.  In this visit she has had pulmonary function test.  It shows a 7% decline in FVC and DLCO in 2 years.  Is roughly 3.5 %/year.  She is actually having more shortness of breath.  She did see cardiology and had a normal Myoview stress test.  She has  been reassured from a cardiology standpoint.  But has dyspnea score is worse.  She is unable to do pulmonary rehabilitation because of issues with family and also because she has osteoarthritis of her knees.  She is seeing rheumatology for this.  She says she has never seen orthopedic for this.  Did indicate to her to talk to rheumatology and see if at this time to do a knee replacement and if she needs an orthopedic referral.  Did indicate that at some point the ILD could get worse and she would be quite dyspneic and unable to mobilize.  But at this point from a safety standpoint would be a good point in time to do a knee replacement if the knees are really bad.  She understood this.  We did discuss worsening.  She is on maximal standard of care.  We did discuss clinical trials as a care option she is interested.  She has been losing weight with pirfenidone there is more weight loss.  Her BMI is 27 and technically she is still overweight.  We decided to continue with pirfenidone.  She will have a liver function test today.  She will have a high-dose flu shot today.     OV 08/18/2021  Subjective:  Patient ID: Nichole Reyes, female , DOB: March 03, 1951 , age 72 y.o. , MRN: [id number] , ADDRESS: [address] [place] [address] PCP Lianne Moris, PA-C Patient Care Team: Lianne Moris, PA-C as PCP - General (Family  Medicine) Jonelle Sidle, MD as PCP - Cardiology (Cardiology)  This Provider for this visit: Treatment Team:  Attending Provider: Kalman Shan, MD    08/18/2021 -   Chief Complaint  Patient presents with   Follow-up    PFT performed today.  Pt states she has been doing okay since last visit. States she gets nauseous every time she takes her Esbriet and also will become SOB with exertion.    Hyperlipidemia  HPI Nichole Reyes 72 y.o. -returns for follow-up.  At this visit she continues to be stable in terms of previous weight loss but also dyspnea on exertion although this time she tells me that the dyspnea is bothering her though the symptom score is stable.  Her pulmonary function test shows overall slow decline over a long period of time but most recently the 2 studies are stable.  She appears to get dyspneic while walking the dog.  She is looking for measures to help her.  Did indicate to her about deconditioning.  She has previously been to pulmonary rehabilitation.  Her schedule a does not allow her to go to rehab.  We talked about doing a 6-minute walk test to look for exertional desaturation and seeing if she would qualify for oxygen.  She is willing to do this.    The more important thing is that she is having significant nausea.  In fact the nausea is worse now.  All of a sudden for the last few months.  She started taking over-the-counter Emetrol a few days ago and this seems to help.  She told me that she is spacing her pirfenidone only every 4 hours.  This is what she thought she needed to do.  I did indicate to her that she needs to space it out at least 5 to 6 hours apart.  We will see if this helps the nausea.  She does drink enough fluids.  We went over her medication list.  This been significant changes to her  anticholesterol medications.  Since earlier this year she has been on Repatha.  Then a few days ago or a few weeks ago her Zetia got changed back to fenofibrate.   The timing of this medication does not fit in with nausea.  Nevertheless she says she has hyperlipidemia for over 30 years.  Managed with primary care.  She says it is very difficult to control.  Never been seen by lipid specialist.  Willing to see Dr. Kenneth Italy Hilty.   Of note she has been interested in clinical trials but given the current nausea she wants to hold off at this point  CT Chest data   OV 02/28/2022  Subjective:  Patient ID: Nichole Reyes, female , DOB: 1951-01-24 , age 56 y.o. , MRN: [id number] , ADDRESS: [address] [place] [address] PCP Lianne Moris, PA-C Patient Care Team: Lianne Moris, PA-C as PCP - General (Family Medicine) Jonelle Sidle, MD as PCP - Cardiology (Cardiology)  This Provider for this visit: Treatment Team:  Attending Provider: Kalman Shan, MD    02/28/2022 -   Chief Complaint  Patient presents with   Follow-up    PFT performed today. Pt states she has been doing okay since last visit.     HPI Nichole Reyes 72 y.o. -returns for follow-up.  Since last seeing me in summer 2023 she is stable.  She tells me that Thanksgiving 2023 she did have outpatient COVID-19.  Treated with Paxlovid.  4 days into this she was extremely tired and then suddenly passed out.  Since then she has been feeling well.  She felt it was because of dehydration and polypharmacy.  However from a respiratory standpoint and pirfenidone tolerance standpoint she is doing really well.  No changes in symptoms.  Pulmonary function test is actually stable since the last 1 year [results below].  She had a normal echocardiogram in the summer 2023.  Results reviewed.  Her acid reflux under control.  In the past we have discussed clinical trials as a care option.  Today discussed the potential for osteoporosis risk with pulmonary fibrosis patients.  This study going on by [employer].  It involves getting a DEXA bone scan and she is willing to participate if she  qualifies.         OV 06/05/2022  Subjective:  Patient ID: Nichole Reyes, female , DOB: 15-Dec-1950 , age 21 y.o. , MRN: [id number] , ADDRESS: [address] [place] [address] PCP Lianne Moris, PA-C Patient Care Team: Lianne Moris, PA-C as PCP - General (Family Medicine) Jonelle Sidle, MD as PCP - Cardiology (Cardiology) Doreatha Massed, MD as Medical Oncologist (Hematology)  This Provider for this visit: Treatment Team:  Attending Provider: Kalman Shan, MD  06/05/2022 -   Chief Complaint  Patient presents with   Follow-up    Pt is here for follow up for IPF. Pt states she is doing well and still taking Esbriet. No side effects noted so far while on medication      HPI Nichole Reyes 72 y.o. -returns for follow-up.  Presents with her husband.  I am meeting the husband for the first time.  He did get dependent history forming the fact that she does get short of breath when she does things around the house and does some minimal yard work.  He does think it slightly progressive over the last 6-12 months.  Patient herself states that after the COVID-19 and Thanksgiving she injured her left shoulder  and then in the aftermath of that after Christmas 2023 she did develop a right-sided DVT and a pulmonary embolism with RV/LV ratio of 1.47 although BNP was normal at that time.  She was discharged slightly after New Year's 2024.  2D echocardiogram done towards the end of January 2024 does not show any right heart strain and it is baseline with grade 1 diastolic dysfunction.  She believes she is slightly more dyspneic than in the past after the pulmonary embolism.  However her shortness of breath scores remained stable and walking desaturation test is stable.  The pulmonary function test though shows a decline in DLCO subsequently [see below].  Her walking desaturation test is stable today.  Lab review also shows a slight amount of anemia in the hospital.  She had  high-resolution CT chest and according to the radiologist pulm fibrosis stable x 2 years.  She is tolerating the pirfenidone well.  However she is continue to lose weight.  She is still overweight technically.    HRCT 05/24/22  Narrative & Impression  CLINICAL DATA:  72 year old female with history of interstitial lung disease.   EXAM: CT CHEST WITHOUT CONTRAST   TECHNIQUE: Multidetector CT imaging of the chest was performed following the standard protocol without intravenous contrast. High resolution imaging of the lungs, as well as inspiratory and expiratory imaging, was performed.   RADIATION DOSE REDUCTION: This exam was performed according to the departmental dose-optimization program which includes automated exposure control, adjustment of the mA and/or kV according to patient size and/or use of iterative reconstruction technique.   COMPARISON:  High-resolution chest CT 05/31/2020. Chest CTA 03/17/2022.   FINDINGS: Cardiovascular: Heart size is mildly enlarged. There is no significant pericardial fluid, thickening or pericardial calcification. There is aortic atherosclerosis, as well as atherosclerosis of the great vessels of the mediastinum and the coronary arteries, including calcified atherosclerotic plaque in the left main, left anterior descending, left circumflex and right coronary arteries. Calcifications of the aortic valve.   Mediastinum/Nodes: No pathologically enlarged mediastinal or hilar lymph nodes. Please note that accurate exclusion of hilar adenopathy is limited on noncontrast CT scans. Esophagus is unremarkable in appearance. No axillary lymphadenopathy.   Lungs/Pleura: High-resolution images again demonstrate widespread areas of peripheral predominant ground-glass attenuation, septal thickening, subpleural reticulation, thickening of the peribronchovascular interstitium, cylindrical bronchiectasis and peripheral bronchiolectasis scattered  throughout the lungs bilaterally. No definitive honeycombing confidently identified. No significant progression of disease compared to the prior study. Findings have a mild craniocaudal gradient. Inspiratory and expiratory imaging is unremarkable. No acute consolidative airspace disease. No pleural effusions. A few scattered tiny pulmonary nodules are noted, stable compared to the prior high-resolution chest CT 05/31/2020, largest of which measures only 3 mm in the medial right upper lobe (axial image 31 of series 5), considered definitively benign requiring no imaging follow-up. Small calcified granuloma also noted in the medial right upper lobe. No other larger more suspicious appearing pulmonary nodules or masses are noted.   Upper Abdomen: Aortic atherosclerosis.   Musculoskeletal: There are no aggressive appearing lytic or blastic lesions noted in the visualized portions of the skeleton.   IMPRESSION: 1. The appearance of the lungs remains compatible with interstitial lung disease, once again categorized as probable usual interstitial pneumonia (UIP) per current ATS guidelines. Given the lack of progression compared to prior examinations, the possibility of an alternative etiology such as fibrotic phase nonspecific interstitial pneumonia (NSIP) should be considered. 2. Mild cardiomegaly. 3. Aortic atherosclerosis, in addition to left main and  three-vessel coronary artery disease. Please note that although the presence of coronary artery calcium documents the presence of coronary artery disease, the severity of this disease and any potential stenosis cannot be assessed on this non-gated CT examination. Assessment for potential risk factor modification, dietary therapy or pharmacologic therapy may be warranted, if clinically indicated. 4. There are calcifications of the aortic valve. Echocardiographic correlation for evaluation of potential valvular dysfunction may be warranted  if clinically indicated.   Aortic Atherosclerosis (ICD10-I70.0).     Electronically Signed   By: Trudie Reed M.D.   On: 05/24/2022 13:25     OV 09/05/2022  Subjective:  Patient ID: Nichole Reyes, female , DOB: Aug 07, 1950 , age 82 y.o. , MRN: [id number] , ADDRESS: [address] [place] [address] PCP Lianne Moris, PA-C Patient Care Team: Lianne Moris, PA-C as PCP - General (Family Medicine) Jonelle Sidle, MD as PCP - Cardiology (Cardiology) Doreatha Massed, MD as Medical Oncologist (Hematology)  This Provider for this visit: Treatment Team:  Attending Provider: Kalman Shan, MD     09/05/2022 -   Chief Complaint  Patient presents with   Follow-up    Doing well.     HPI Nichole Reyes 72 y.o. -presents with her husband Kathlene November for follow-up.  Kathlene November is now retired.  He was the Agricultural consultant for a shoe company called [employer].  He is now accompanying her with visits.  She reports overall being stable.  Dyspnea score is stable.  She is actually gained some weight because of increased sugar intake.  She continues with the pirfenidone.  It does give her some fatigue and nausea but she says it is well-controlled.  Was supposed to get pulmonary function test this week but unfortunately was done shortly after the last visit.  Disease appears stable between March and April 2024.  Currently the symptom score is stable.  The excess hypoxemia test results are stable.  She did get dyspneic doing exercise test.  We talked about pulm rehabilitation.  She did not attended previously because of scheduling convenience.  Decided to do another referral and referred to the rehab center at [facility].  She had a pulm embolism and right-sided DVT in December 2023: Will check a D-dimer now to decide on length of follow-up.  She is completed 6 months of treatment.  Suspect she will need to do 1 full year at full dose  Discussed clinical trials as a care option again.  She is  interested.  Her husband Kathlene November had some questions.  Gave her consent form for study involving [employer].  We gave her the date on this.  The studies called beacon.  At some point we will prescreener.  OV 02/20/2023  Subjective:  Patient ID: Nichole Reyes, female , DOB: December 08, 1950 , age 73 y.o. , MRN: [id number] , ADDRESS: [address] [place] [address] PCP Lianne Moris, PA-C Patient Care Team: Lianne Moris, PA-C as PCP - General (Family Medicine) Jonelle Sidle, MD as PCP - Cardiology (Cardiology) Doreatha Massed, MD as Medical Oncologist (Hematology) Kalman Shan, MD as Consulting Physician (Pulmonary Disease)  This Provider for this visit: Treatment Team:  Attending Provider: Kalman Shan, MD    02/20/2023 -   Chief Complaint  Patient presents with   Follow-up     Clinical IPF diagnosis given dec 2020 and reiterated Mid Feb 2021:  based on your age greater than 65, clubbing on exam, CT scan with high probability for this  condition, negative history on your questionnaire and negative blood test on serology exams .  - Oon Esbriet since 05/22/2019 - Last PFT  April  2024 -> Nov 2024 - Last CT scan of the chest March 2024 -. CTA July 2024 without change   GERD/Hiatal Hernia   DJD/OA knee =-   Drug induced weihgt loss -due to pirfenidone  Grade 1 diastolic dysfunction with normal pulmonary artery pressure  - last echo Jan 2024   History of COVID-19 Thanksgiving 2023 treated with outpatient Paxlovid  -Fatigue related syncope along with polypharmacy   PE 03/17/22 - 03/20/22 admission with Rt DVT - Rx Eliquis RV/LV ratio 1.47, BMP{ 60  - ECHO Apr 13, 2022 - No RV strain and normal PAP. Gr 1 ddx  - Didimer normal FEb 2024. July 2024  - CTA July 2024 - No PE  -Stopped Eliquis by Rojelio Brenner PA and hematology July 2024.  Lung nodules as of July 2024  = odular architectural distortion in the superior segment of the right lower lobe  along the right major fissure measuring 1.5 x 1.0 cm is not significantly changed since 2022.   - . Unchanged 9 x 6 mm subpleural pulmonary nodule in the right middle lobe (series 6, image 79), benig   Esbriet/Pirfenidone requires intensive drug monitoring due to high concerns for Adverse effects of , including  Drug Induced Liver Injury, significant GI side effects that include but not limited to Diarrhea, Nausea, Vomiting,  and other system side effects that include Fatigue, headaches, weight loss and other side effects such as skin rash. These will be monitored with  blood work such as LFT initially once a month for 6 months and then quarterly    HPI Nichole Reyes 72 y.o. -returns for routine follow-up.  Since I last saw her in June 2024 she followed up with PA and hematology in July 2024.  A CT angiogram chest was done and there was no pulmonary embolism.  A D-dimer at that time was normal based on lab review.  External record review indicates that because it was a provoked PE which is from COVID and immobility and these test being normal and coagulopathy testing being negative they stopped Eliquis.  I did discuss with the patient about getting a recheck D-dimer today and she is agreeable.  In terms of pirfenidone: She is tolerating this overall well except for the fact she has nausea intermittently she rated it is level 4 but she tells me its only intermittent and occasional and she is able to manage without any antinausea medications.  She also has altered taste and she is not able to taste the burger as well.  Despite this she does not want to change the pirfenidone to nintedanib.  She had a liver function test just before Thanksgiving 2024 it is normal  In terms of pulmonary fibrosis and symptoms: She is more dyspneic doing activities.  This is reflected in the symptom score [see below].  We did an exercise sit/stand hypoxemia test and the some exaggerated tendency to desaturate compared  to the past.  I did indicate to her that very likely she is declining lung function and pulmonary fibrosis.  She is not interested in switching to nintedanib.  We discussed about adding clinical trial but she was interested at the last visit but she tells me she has daughter's wedding to plan for in the summer 2025 and she is just worried about living in [place] and commuting here for  all the requirements of the test.  Did indicate that her time in travel are covered.  If needed taxi services can be provided.  However she feels that she wants to reflect on all this for the next couple of months and then decide.  Did indicate to her that at this point in time there is concern for lung function worsening.    Other issues - She did see Sherron Ales 02/13/2019 for rheumatology.  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  Autoimmune profile was done this is normal. -She saw her cardiologist Dr. Simona Huh 10/31/2022 note reviewed. -CT angiogram chest reported in July 2024 as disease without progression but given a contrast CT scan this is inaccurate.  I personally visualized that CT chest   SYMPTOM SCALE - ILD 05/06/2019 179# 12/16/2019 172# - esbriet + 05/06/2020 169# - esbriet 07/13/2020 167# - on esbreiet 01/24/2021 164# 08/18/2021 166# - esbriet 02/28/2022 162# 06/05/2022 160# 09/05/2022 167# 02/20/2023 170#  O2 use ra           Shortness of Breath 0 -> 5 scale with 5 being worst (score 6 If unable to do)           At rest 4 1 0 2 3 1 1 3 3 3   Simple tasks - showers, clothes change, eating, shaving 4 1 2 2 4 3 4 5 4 5   Household (dishes, doing bed, laundry) 5 5 4 4 5 5 5 4 5 5   Shopping 5 3 4 3 4 5 5 5 4 5   Walking level at own pace 5 3 4 3 4 5 5 3 4 5   Walking up Stairs 5 5 5 4 5 5 5 5 5 5   Total (30-36) Dyspnea Score 28 15 19 18 25 24 25 25 25 28   How bad is your cough? 2.5 1 0 0 0 2 3 2 3 2   How bad is your fatigue  yes 4 3 4 3 4 4 3 3 3   How bad is nausea x 0 2 0 2 4 1 1 3 4   How bad is vomiting?  x 0 0 0 0 0 0 0  0 0  How bad is diarrhea? x 0 0 0 0 0 0 0 0 0  How bad is anxiety? x 00 1 2 3 2 4 2 3 4   How bad is depression x 0 3 2 2 3 4 1 3 4       Simple office walk 185 feet x  3 laps goal with forehead probe 05/06/2019  12/16/2019  05/06/2020  07/13/2020  01/24/2021  08/18/2021  06/05/2022  09/05/2022  02/20/2023   O2 used ra ra ra ra ra ra  ra ra  Number laps completed 3 3 3  goal - stopped at 2 due to dyspnea 3 and did all 3 3 3   Sit ad stand x 10 Sist stand x 10  Comments about pace avg mod Slow pace avg       Resting Pulse Ox/HR 98% and 90/min 97% and 77/min 98% and 90/min 96% and 94 98% and 90 100% and 84 97% and HR 82 97% and HR 81 95% and HR 74  Final Pulse Ox/HR 96% and 100/min 97% and 91/mn 94% and 92 at 2nd lap 93% and 103/min 96% and 99 95% and 96 95% and HR 94 95% and HR 83 90% and HR 88  Desaturated </= 88% no no no no no      Desaturated <= 3%  points no no Yes,, 4 pint Yes 3 points no      Got Tachycardic >/= 90/min yes yes yes yes       Symptoms at end of test Mod dyspnea, knee pain Dyspnea and fatigue strting at 1st lap and increased by 3rd stoppe early and did drop 3 points Dyspnea at end  Dyspnea mild to mod, light headed No dyspnea Level 5 dyspnea Level 5 dyspnea and could not do more  Miscellaneous comments x             PFT     Latest Ref Rng & Units 01/19/2023   12:48 PM 07/18/2022    3:04 PM 06/05/2022   11:46 AM 11/29/2021   11:32 AM 08/18/2021    9:16 AM 01/24/2021   10:55 AM 07/13/2020    1:01 PM  ILD indicators  FVC-Pre L 2.03  2.10  2.09  2.10  2.14  2.12  2.32   FVC-Predicted Pre % 64  66  66  65  66  66  72   DLCO uncorrected ml/min/mmHg 10.69  11.75  11.10  14.80  15.98  14.63  13.98   DLCO UNC %Pred % 52  58  54  72  78  72  68   DLCO Corrected ml/min/mmHg 10.69  11.75  11.14  14.80  15.98  14.63  13.98   DLCO COR %Pred % 52  58  55  72  78  72  68       LAB RESULTS last 96 hours No results found.  LAB RESULTS last 90 days Recent Results (from the past  2160 hour(s))  Pulmonary function test     Status: None   Collection Time: 01/19/23 12:48 PM  Result Value Ref Range   FVC-Pre 2.03 L   FVC-%Pred-Pre 64 %   FEV1-Pre 1.76 L   FEV1-%Pred-Pre 73 %   FEV6-Pre 2.02 L   FEV6-%Pred-Pre 66 %   Pre FEV1/FVC ratio 87 %   FEV1FVC-%Pred-Pre 114 %   Pre FEV6/FVC Ratio 100 %   FEV6FVC-%Pred-Pre 104 %   FEF 25-75 Pre 2.69 L/sec   FEF2575-%Pred-Pre 134 %   DLCO unc 10.69 ml/min/mmHg   DLCO unc % pred 52 %   DLCO cor 10.69 ml/min/mmHg   DLCO cor % pred 52 %   DL/VA 2.20 ml/min/mmHg/L   DL/VA % pred 93 %  COMPLETE METABOLIC PANEL WITH GFR     Status: Abnormal   Collection Time: 02/13/23  1:33 PM  Result Value Ref Range   Glucose, Bld 117 (H) 65 - 99 mg/dL    Comment: .            Fasting reference interval . For someone without known diabetes, a glucose value between 100 and 125 mg/dL is consistent with prediabetes and should be confirmed with a follow-up test. .    BUN 15 7 - 25 mg/dL   Creat 2.54 2.70 - 6.23 mg/dL   eGFR 80 > OR = 60 JS/EGB/1.51V6   BUN/Creatinine Ratio SEE NOTE: 6 - 22 (calc)    Comment:    Not Reported: BUN and Creatinine are within    reference range. .    Sodium 141 135 - 146 mmol/L   Potassium 4.6 3.5 - 5.3 mmol/L   Chloride 104 98 - 110 mmol/L   CO2 28 20 - 32 mmol/L   Calcium 9.7 8.6 - 10.4 mg/dL   Total Protein 7.0 6.1 - 8.1 g/dL   Albumin  4.1 3.6 - 5.1 g/dL   Globulin 2.9 1.9 - 3.7 g/dL (calc)   AG Ratio 1.4 1.0 - 2.5 (calc)   Total Bilirubin 0.3 0.2 - 1.2 mg/dL   Alkaline phosphatase (APISO) 80 37 - 153 U/L   AST 20 10 - 35 U/L   ALT 19 6 - 29 U/L  CBC with Differential/Platelet     Status: Abnormal   Collection Time: 02/13/23  1:33 PM  Result Value Ref Range   WBC 9.9 3.8 - 10.8 Thousand/uL   RBC 4.33 3.80 - 5.10 Million/uL   Hemoglobin 13.2 11.7 - 15.5 g/dL   HCT 29.5 62.1 - 30.8 %   MCV 93.1 80.0 - 100.0 fL   MCH 30.5 27.0 - 33.0 pg   MCHC 32.8 32.0 - 36.0 g/dL    Comment: For adults, a  slight decrease in the calculated MCHC value (in the range of 30 to 32 g/dL) is most likely not clinically significant; however, it should be interpreted with caution in correlation with other red cell parameters and the patient's clinical condition.    RDW 12.2 11.0 - 15.0 %   Platelets 493 (H) 140 - 400 Thousand/uL   MPV 10.2 7.5 - 12.5 fL   Neutro Abs 6,207 1,500 - 7,800 cells/uL   Absolute Lymphocytes 2,406 850 - 3,900 cells/uL   Absolute Monocytes 960 (H) 200 - 950 cells/uL   Eosinophils Absolute 218 15 - 500 cells/uL   Basophils Absolute 109 0 - 200 cells/uL   Neutrophils Relative % 62.7 %   Total Lymphocyte 24.3 %   Monocytes Relative 9.7 %   Eosinophils Relative 2.2 %   Basophils Relative 1.1 %  Sedimentation rate     Status: None   Collection Time: 02/13/23  1:33 PM  Result Value Ref Range   Sed Rate 29 0 - 30 mm/h  C-reactive protein     Status: Abnormal   Collection Time: 02/13/23  1:33 PM  Result Value Ref Range   CRP 14.9 (H) <8.0 mg/L  Rheumatoid factor     Status: None   Collection Time: 02/13/23  1:33 PM  Result Value Ref Range   Rheumatoid fact SerPl-aCnc <10 <14 IU/mL  Cyclic citrul peptide antibody, IgG     Status: None   Collection Time: 02/13/23  1:33 PM  Result Value Ref Range   Cyclic Citrullin Peptide Ab <65 UNITS    Comment: Reference Range Negative:            <20 Weak Positive:       20-39 Moderate Positive:   40-59 Strong Positive:     >59 .   ANA     Status: None   Collection Time: 02/13/23  1:33 PM  Result Value Ref Range   Anti Nuclear Antibody (ANA) NEGATIVE NEGATIVE    Comment: ANA IFA is a first line screen for detecting the presence of up to approximately 150 autoantibodies in various autoimmune diseases. A negative ANA IFA result suggests an ANA-associated autoimmune disease is not present at this time, but is not definitive. If there is high clinical suspicion for Sjogren's syndrome, testing for anti-SS-A/Ro antibody should be  considered. Anti-Jo-1 antibody should be considered for clinically suspected inflammatory myopathies. . AC-0: Negative . International Consensus on ANA Patterns (SeverTies.uy) . For additional information, please refer to http://education.[facility].com/faq/FAQ177 (This link is being provided for informational/ educational purposes only.) .   Anti-scleroderma antibody     Status: None   Collection Time: 02/13/23  1:33  PM  Result Value Ref Range   Scleroderma (Scl-70) (ENA) Antibody, IgG <1.0 NEG <1.0 NEG AI  RNP Antibody     Status: None   Collection Time: 02/13/23  1:33 PM  Result Value Ref Range   Ribonucleic Protein(ENA) Antibody, IgG <1.0 NEG <1.0 NEG AI  Anti-Smith antibody     Status: None   Collection Time: 02/13/23  1:33 PM  Result Value Ref Range   ENA SM Ab Ser-aCnc <1.0 NEG <1.0 NEG AI  Sjogrens syndrome-A extractable nuclear antibody     Status: None   Collection Time: 02/13/23  1:33 PM  Result Value Ref Range   SSA (Ro) (ENA) Antibody, IgG <1.0 NEG <1.0 NEG AI  Sjogrens syndrome-B extractable nuclear antibody     Status: None   Collection Time: 02/13/23  1:33 PM  Result Value Ref Range   SSB (La) (ENA) Antibody, IgG <1.0 NEG <1.0 NEG AI  Anti-DNA antibody, double-stranded     Status: None   Collection Time: 02/13/23  1:33 PM  Result Value Ref Range   ds DNA Ab <1 IU/mL    Comment:                            IU/mL       Interpretation                            < or = 4    Negative                            5-9         Indeterminate                            > or = 10   Positive .   C3 and C4     Status: None   Collection Time: 02/13/23  1:33 PM  Result Value Ref Range   C3 Complement 186 83 - 193 mg/dL   C4 Complement 21 15 - 57 mg/dL         has a past medical history of Broken humerus (02/2022), Coronary artery calcification seen on CT scan, Essential hypertension, Fibromyalgia, Idiopathic pulmonary fibrosis  (HCC), Mixed hyperlipidemia, Osteoporosis, and Type 2 diabetes mellitus (HCC).   reports that she has never smoked. She has been exposed to tobacco smoke. She has never used smokeless tobacco.  Past Surgical History:  Procedure Laterality Date   ABDOMINAL HYSTERECTOMY  2007   CATARACT EXTRACTION Bilateral     Allergies  Allergen Reactions   Latex    Pseudoephedrine Other (See Comments)    Rash and hallucinations    Sulfa Antibiotics    Sulfonamide Derivatives Rash    Immunization History  Administered Date(s) Administered   Fluad Quad(high Dose 65+) 01/29/2019, 12/16/2019, 01/24/2021   Influenza Split 12/18/2017   Influenza-Unspecified 11/18/2013   PFIZER(Purple Top)SARS-COV-2 Vaccination 05/15/2019, 06/13/2019   PNEUMOCOCCAL CONJUGATE-20 03/20/2022   Tdap 05/04/1999    Family History  Problem Relation Age of Onset   Hypertension Mother    Diabetes Mother    Osteoporosis Mother    Hypertension Father    Heart disease Father    COPD Father    Colon cancer Sister    Colon cancer Brother    Heart disease Brother    Cancer Brother  Heart disease Brother    Emphysema Paternal Grandfather      Current Outpatient Medications:    ACCU-CHEK AVIVA PLUS test strip, , Disp: , Rfl:    Accu-Chek Softclix Lancets lancets, , Disp: , Rfl:    albuterol (VENTOLIN HFA) 108 (90 Base) MCG/ACT inhaler, Inhale 2 puffs into the lungs every 4 (four) hours as needed for wheezing or shortness of breath., Disp: , Rfl:    aspirin EC 81 MG tablet, Take 81 mg by mouth daily. Swallow whole., Disp: , Rfl:    Azelastine HCl 137 MCG/SPRAY SOLN, , Disp: , Rfl:    Calcium Carbonate (CALTRATE 600 PO), Take 1 tablet by mouth 2 (two) times a day., Disp: , Rfl:    clonazePAM (KLONOPIN) 0.5 MG tablet, Take 0.5 mg by mouth at bedtime., Disp: , Rfl:    cyanocobalamin (VITAMIN B12) 1000 MCG tablet, Take 1,000 mcg by mouth daily., Disp: , Rfl:    ESBRIET 801 MG TABS, Take 1 tablet (801 mg total) by mouth 3  (three) times daily with meals., Disp: 270 tablet, Rfl: 1   fenofibrate (TRICOR) 48 MG tablet, Take 48 mg by mouth at bedtime., Disp: , Rfl:    Fluticasone Furoate (ARNUITY ELLIPTA) 100 MCG/ACT AEPB, Inhale 1 puff into the lungs daily., Disp: 30 each, Rfl: 6   levocetirizine (XYZAL) 5 MG tablet, Take 5 mg by mouth daily., Disp: , Rfl:    losartan (COZAAR) 50 MG tablet, Take 50 mg by mouth daily., Disp: , Rfl:    metFORMIN (GLUCOPHAGE-XR) 500 MG 24 hr tablet, Take 500 mg by mouth daily with breakfast. , Disp: , Rfl:    metoprolol succinate (TOPROL-XL) 100 MG 24 hr tablet, Take 100 mg by mouth daily. Take with or immediately following a meal., Disp: , Rfl:    omeprazole (PRILOSEC) 20 MG capsule, Take 1 capsule (20 mg total) by mouth daily., Disp: 30 capsule, Rfl: 11   REPATHA SURECLICK 140 MG/ML SOAJ, Inject 1 mL into the skin every 14 (fourteen) days., Disp: , Rfl:    SAVELLA 50 MG TABS tablet, Take 50 mg by mouth 2 (two) times daily. , Disp: , Rfl:    spironolactone (ALDACTONE) 25 MG tablet, take (1/2) tablet by mouth daily., Disp: 45 tablet, Rfl: 2   tiZANidine (ZANAFLEX) 2 MG tablet, Take 2 mg by mouth 2 (two) times daily., Disp: , Rfl:    tiZANidine (ZANAFLEX) 4 MG tablet, TAKE (1) TABLET EVERY NIGHT AT BEDTIME., Disp: 30 tablet, Rfl: 0      Objective:   Vitals:   02/20/23 1053  BP: 116/74  Pulse: 74  SpO2: 98%  Weight: 170 lb (77.1 kg)  Height: 5\' 5"  (1.651 m)    Estimated body mass index is 28.29 kg/m as calculated from the following:   Height as of this encounter: 5\' 5"  (1.651 m).   Weight as of this encounter: 170 lb (77.1 kg).  @WEIGHTCHANGE @  [employer]   02/20/23 1053  Weight: 170 lb (77.1 kg)     Physical Exam   General: No distress. Looks same O2 at rest: no Cane present: no Sitting in wheel chair: no Frail: no Obese: no Neuro: Alert and Oriented x 3. GCS 15. Speech normal Psych: Pleasant Resp:  Barrel Chest - no.  Wheeze - no, Crackles - YES MILD  BASE, No overt respiratory distress CVS: Normal heart sounds. Murmurs - no Ext: Stigmata of Connective Tissue Disease - no HEENT: Normal upper airway. PEERL +. No post nasal  drip        Assessment:       ICD-10-CM   1. IPF (idiopathic pulmonary fibrosis) (HCC)  J84.112 B Nat Peptide    D-Dimer, Quantitative    D-Dimer, Quantitative    B Nat Peptide    2. Medication monitoring encounter  Z51.81     3. History of pulmonary embolism  Z86.711     4. DOE (dyspnea on exertion)  R06.09          Plan:     Patient Instructions  IPF (idiopathic pulmonary fibrosis) (HCC) Medication monitoring encounter Gastroesophageal reflux disease, unspecified whether esophagitis present Dyspnea on exertion    - According to symptoms, exercise hypoxemia test  PFT -  disesase is slowly progressive consistently over time - LIFT normal Nov 2024   Plan - check BNP 02/20/2023 -Continue acid reflux medications -Continue pirfenidone per schedule   -At this point in time benefit outweighs risk  -Continue space 5-6h between meals and apply sunscreen when going out -We discussed clinical trials as a care option and appreciate you want to hold off due to commitment conflicts -  Do spiro/dlco in 2-3 months     Pulmonary Embolism with Rt lower extremity DVT Dec 2023  - ? Due to covid as risk factor and shoulder injury - CTA negative July 2024 and Sept 2024 Buford Dresser stopped Eliquis  Plan - check d-dimer 02/20/2023 - if elevated might have to consider low dose restart   Followup -Return to see  DR Marchelle Gearing in 2-3 months - 15 min visit but after spiro/dlco    FOLLOWUP Return in about 10 weeks (around 05/01/2023) for 15 min visit, ILD, after Cleda Daub and DLCO, Face to Face Visit, with Dr Marchelle Gearing.    SIGNATURE    Dr. Kalman Shan, M.D., F.C.C.P,  Pulmonary and Critical Care Medicine Staff Physician, [facility] Center Director - Interstitial Lung Disease  Program   Pulmonary Fibrosis [facility] at [facility] [place], [place], [address]  Pager: [phone], If no answer or between  15:00h - 7:00h: call [phone] Telephone: [phone]  12:12 PM 02/20/2023   HIGh Complexity  OFFICE   2021 E/M guidelines, first released in 2021, with minor revisions added in 2023. Must meet the requirements for 2 out of 3 dimensions to qualify.    Number and complexity of problems addressed Amount and/or complexity of data reviewed Risk of complications and/or morbidity  Severe exacerbation of chronic illness  Acute or chronic illnesses that may pose a threat to life or bodily function, e.g., multiple trauma, acute MI, pulmonary embolus, severe respiratory distress, progressive rheumatoid arthritis, psychiatric illness with potential threat to self or others, peritonitis, acute renal failure, abrupt change in neurological status Must meet the requirements for 2 of 3 of the categories)  Category 1: Tests and documents, historian  Any combination of 3 of the following:  Assessment requiring an independent historian  Review of prior external note(s) from each unique source  Review of results of each unique test  Ordering of each unique test    Category 2: Interpretation of tests    Independent interpretation of a test performed by another physician/other qualified health care professional (not separately reported)  Category 3: Discuss management/tests  Discussion of management or test interpretation with external physician/other qualified health care professional/appropriate source (not separately reported)  HIGH risk of morbidity from additional diagnostic testing or treatment Examples only:  Drug therapy requiring  intensive monitoring for toxicity  Decision for elective major surgery with identified pateint or procedure risk factors  Decision regarding hospitalization or escalation of level of care  Decision for  DNR or to de-escalate care   Parenteral controlled  substances

## 2023-02-20 NOTE — Patient Instructions (Addendum)
IPF (idiopathic pulmonary fibrosis) (HCC) Medication monitoring encounter Gastroesophageal reflux disease, unspecified whether esophagitis present Dyspnea on exertion    - According to symptoms, exercise hypoxemia test  PFT -  disesase is slowly progressive consistently over time - LIFT normal Nov 2024   Plan - check BNP 02/20/2023 -Continue acid reflux medications -Continue pirfenidone per schedule   -At this point in time benefit outweighs risk  -Continue space 5-6h between meals and apply sunscreen when going out -We discussed clinical trials as a care option and appreciate you want to hold off due to commitment conflicts -  Do spiro/dlco in 2-3 months     Pulmonary Embolism with Rt lower extremity DVT Dec 2023  - ? Due to covid as risk factor and shoulder injury - CTA negative July 2024 and Sept 2024 Nichole Reyes stopped Eliquis  Plan - check d-dimer 02/20/2023 - if elevated might have to consider low dose restart   Followup -Return to see  Nichole Reyes in 2-3 months - 15 min visit but after spiro/dlco

## 2023-02-21 DIAGNOSIS — R739 Hyperglycemia, unspecified: Secondary | ICD-10-CM | POA: Diagnosis not present

## 2023-02-21 DIAGNOSIS — D519 Vitamin B12 deficiency anemia, unspecified: Secondary | ICD-10-CM | POA: Diagnosis not present

## 2023-02-21 DIAGNOSIS — I10 Essential (primary) hypertension: Secondary | ICD-10-CM | POA: Diagnosis not present

## 2023-02-21 DIAGNOSIS — E7849 Other hyperlipidemia: Secondary | ICD-10-CM | POA: Diagnosis not present

## 2023-02-21 DIAGNOSIS — E039 Hypothyroidism, unspecified: Secondary | ICD-10-CM | POA: Diagnosis not present

## 2023-02-21 DIAGNOSIS — J8410 Pulmonary fibrosis, unspecified: Secondary | ICD-10-CM | POA: Diagnosis not present

## 2023-02-21 LAB — D-DIMER, QUANTITATIVE: D-DIMER: 0.67 mg{FEU}/L — ABNORMAL HIGH (ref 0.00–0.49)

## 2023-02-21 LAB — BRAIN NATRIURETIC PEPTIDE: BNP: 45.6 pg/mL (ref 0.0–100.0)

## 2023-02-22 ENCOUNTER — Telehealth: Payer: Self-pay | Admitting: Internal Medicine

## 2023-02-22 NOTE — Telephone Encounter (Signed)
  Her d-dimer is creeping back up again after she stopped her eliquis  Plan   - recommend she talk to [place] the PA in oncology if she should resume low dose eliquis   Latest Reference Range & Units 05/05/22 11:03 09/05/22 12:00 10/03/22 09:31 02/20/23 12:05  D-Dimer, Quant 0.00 - 0.50 ug/mL-FEU 0.27 0.26 0.35   D-dimer 0.00 - 0.49 mg/L FEU    0.67 (H)  (H): Data is abnormally high

## 2023-02-26 ENCOUNTER — Encounter: Payer: Medicare Other | Admitting: Physical Therapy

## 2023-02-28 DIAGNOSIS — E7849 Other hyperlipidemia: Secondary | ICD-10-CM | POA: Diagnosis not present

## 2023-02-28 DIAGNOSIS — M797 Fibromyalgia: Secondary | ICD-10-CM | POA: Diagnosis not present

## 2023-02-28 DIAGNOSIS — Z23 Encounter for immunization: Secondary | ICD-10-CM | POA: Diagnosis not present

## 2023-02-28 DIAGNOSIS — T466X5A Adverse effect of antihyperlipidemic and antiarteriosclerotic drugs, initial encounter: Secondary | ICD-10-CM | POA: Diagnosis not present

## 2023-02-28 DIAGNOSIS — I10 Essential (primary) hypertension: Secondary | ICD-10-CM | POA: Diagnosis not present

## 2023-02-28 DIAGNOSIS — M542 Cervicalgia: Secondary | ICD-10-CM | POA: Diagnosis not present

## 2023-02-28 DIAGNOSIS — J8410 Pulmonary fibrosis, unspecified: Secondary | ICD-10-CM | POA: Diagnosis not present

## 2023-02-28 DIAGNOSIS — M609 Myositis, unspecified: Secondary | ICD-10-CM | POA: Diagnosis not present

## 2023-02-28 DIAGNOSIS — R739 Hyperglycemia, unspecified: Secondary | ICD-10-CM | POA: Diagnosis not present

## 2023-02-28 DIAGNOSIS — R519 Headache, unspecified: Secondary | ICD-10-CM | POA: Diagnosis not present

## 2023-03-01 NOTE — Telephone Encounter (Signed)
Called patient.  Gave information.  Patient verbalized understanding.  She will call oncology today and get a message to the PA, [place].

## 2023-03-05 ENCOUNTER — Encounter: Payer: Medicare Other | Admitting: Physical Therapy

## 2023-03-05 ENCOUNTER — Telehealth: Payer: Self-pay | Admitting: Internal Medicine

## 2023-03-05 NOTE — Telephone Encounter (Signed)
Patient will no longer be coved in January for Esbreit. She is wondering if there is any other alternatives. Call back number [phone]

## 2023-03-05 NOTE — Progress Notes (Signed)
[facility] [address], [place] [address]   CLINIC:  Medical Oncology/Hematology  PCP:  Lianne Moris, PA-C [address] [place] [address] [phone]   REASON FOR VISIT:  Follow-up for provoked right leg DVT and PE  PRIOR THERAPY: Eliquis  CURRENT THERAPY: None  INTERVAL HISTORY:   Nichole Reyes 72 y.o. female was previously seen at our clinic for provoked DVT/PE in the setting of COVID-19.  She was discharged from clinic on 10/10/2022, but returns today after pulmonologist noted slightly increased D-dimer 0.67 on 02/20/2023, which was increased from D-dimer 0.35 on 10/03/2022.    Eliquis was discontinued in July 2024 (s/p 6 months total anticoagulation), after right lower extremity venous US and CTA chest from 10/03/2022 showed complete resolution of DVT and PE.  At today's visit, patient denies any new onset leg swelling or erythema.  She does report some throbbing pain in her right leg that has been chronic and intermittent for "many years," predating her prior blood clots.  She has baseline shortness of breath and dyspnea on exertion related to her pulmonary fibrosis.   She denies any chest pain, hemoptysis, palpitations, or lightheadedness.  Patient reports that she has had "sinus issues" for the past several weeks, and has been on antibiotic treatment for sinus infection for the past week.  She has 60% energy and 70% appetite. She endorses that she is maintaining a stable weight.  ASSESSMENT & PLAN:  1.  DVT and pulmonary embolism, provoked: - COVID infection 02/11/2022, fever and taste altered, treated with Paxlovid - Left humerus fracture (03/09/2022) from mechanical fall while walking her dog.  After the fracture, she was wearing a sling and mostly stayed on the couch. - CT angio (03/17/2022): Acute PE with moderate embolic burden.  Evidence of right heart strain consistent with at least submassive PE. - Doppler (03/19/2022): Acute DVT limited to the  right gastrocnemius calf vein.  No evidence of left lower extremity DVT. - It appears that she has at least 2 transient provoking factors including COVID infection and immobility from fall and left arm fracture. - Coagulopathy testing was negative (checked for lupus anticoagulant, anticardiolipin antibodies, antibeta 2 glycoprotein 1 antibodies, factor V Leiden, prothrombin gene mutation) - Patient was treated with 36-month course of Eliquis due to large clot burden - Venous US right lower extremity (10/03/2022):  No evidence of DVT or SVT in right leg, with particular attention paid to the right gastrocnemius vein - CTA Chest (10/03/2022):  No evidence of pulmonary embolism - She completed 6 months of Eliquis (December 2023 through July 2024) - 70-month duration of anticoagulation was recommended by Dr. Ellin Saba due to clot burden - Eliquis was discontinued in July 2024 based on the following: Complete resolution of DVT and PE based on imaging from 10/03/2022 Resolution of initial provoking risk factors (COVID infection and immobility from fall/fracture) Negative coagulopathy testing - Pulmonologist noted slightly increased D-dimer 0.67 on 02/20/2023, which was increased from D-dimer 0.35 on 10/03/2022. - Patient denies any new onset unilateral leg swelling, chest pain, hemoptysis, or dyspnea. - Physical exam not learning for any acute DVT or PE. - PLAN: Patient case discussed extensively with Dr. Ellin Saba, who does not recommend for indefinite anticoagulation at this time.  (See "DISCUSSION" below.) - Although D-dimer has trended slightly upward after discontinuation of Eliquis, patient is still within her age-adjusted D-dimer cutoff at <0.7 mcg/mL.  Elevated D-dimer likely influenced by concurrent acute sinus infection being treated with antibiotics. -  We will check repeat D-dimer in 3 months and see patient for follow-up visit and further discussion. - Patient is aware that prior blood clots (even  provoked) do slightly increase her risk for future blood clots, although there is no strong indication for long-term anticoagulation at this time. - If she has any recurrent clots, she would likely need indefinite anticoagulation.  DISCUSSION: Per Up-To-Date, reduced mobility at home for 3+ days is considered a minor transient risk factor.  Per Clinical Key, severe COVID-19 is considered a major transient risk factor for venous thromboembolism.  Mild COVID-19 infections are considered a minor transient risk factor.  Having two or more minor transient risk factors can synergistically increase the risk of VTE.  DASH Prediction Score and HERDOO2 Rule not relevant, as intended only for unprovoked VTE.  Per Up-To-Date article titled "Selecting Adult Patients with Lower Extremity DVT and PE for Indefinite Anticoagulation", there are differing schools of thought among experts regarding indefinite anticoagulation in patients with first episode of VTE due to transient minor risk factor.  "Some experts favor indefinite anticoagulation in this population based upon the premise that VTE and the presence of a minor risk factor may represent a propensity for thrombosis.  However others prefer not to indefinitely anticoagulate this population.  In such situations, use of additional factors and the patient's preference is particularly important in helping the patient and clinician come to a decision."  Discussed with patient that according to [facility] ([employer] of Hematology), "risk of recurrence in patients with VTE provoked by nonsurgical trigger is higher than in patients with VTE provoked by surgery, the risk is still low and is estimated at 5% within 1 year and 15% within 5 years.Marland KitchenMarland KitchenWe discourage indefinite therapy if there is a convincing reversible risk factor" [whether surgical or nonsurgical].  (NOTE: This recommendation against indefinite anticoagulation is considered weaker in patients with nonsurgical  provoking factors compared to surgical provoking factors.)  Considering the following, we do not see strong indication for indefinite anticoagulation at this time: Patient had 2 compounding transient minor risk factors (reduced mobility following fall/left humerus fracture and mild COVID-19 infection) Patient is female Patient had proven resolution of DVT/PE Patient does not have any persistent risk factors (apart from now being s/p first episode VTE) Patient does not have any family history of VTE. Mildly elevated D-dimer (still within age-adjusted normal limits of < 0.70) in the setting of acute sinus infection is not convincing of need for additional anticoagulation at this time. Patient does not have any current physical exam findings or symptoms concerning for DVT/PE.  However, patient values and preferences, as well as clinical judgment of other clinicians must be taken into account.  If other specialists favor indefinite anticoagulation, we would not prohibit this from hematology standpoint.     2.  Pulmonary fibrosis: - She is on pirfenidone.  She lost 10 pounds since she was started on it. - Uses Flovent twice daily and albuterol.  3.  Social/family history: - Lives at home with her husband.  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  Later she went on disability secondary to fibromyalgia.  She is non-smoker. - No family history of DVTs or miscarriages.  Sister died of colon cancer.  Brother had colon cancer.  PLAN SUMMARY: >> Same-day labs (D-dimer) + OFFICE visit in 3 months     REVIEW OF SYSTEMS:   Review of Systems  Constitutional:  Positive for fatigue. Negative for appetite change, chills, diaphoresis, fever  and unexpected weight change.  HENT:   Negative for lump/mass and nosebleeds.   Eyes:  Negative for eye problems.  Respiratory:  Positive for cough and shortness of breath. Negative for hemoptysis.   Cardiovascular:  Negative for chest pain, leg swelling  and palpitations.  Gastrointestinal:  Positive for nausea. Negative for abdominal pain, blood in stool, constipation, diarrhea and vomiting.  Genitourinary:  Negative for frequency and hematuria.   Skin: Negative.   Neurological:  Positive for headaches. Negative for dizziness and light-headedness.  Hematological:  Does not bruise/bleed easily.  Psychiatric/Behavioral:  Positive for sleep disturbance.      PHYSICAL EXAM:  ECOG PERFORMANCE STATUS: 0 - Asymptomatic  Vitals:   03/06/23 1423  BP: (!) 149/67  Pulse: 80  Resp: 18  Temp: 97.8 F (36.6 C)  SpO2: 98%   Filed Weights   03/06/23 1423  Weight: 169 lb 3.2 oz (76.7 kg)   Physical Exam Constitutional:      Appearance: Normal appearance. She is normal weight.  Cardiovascular:     Heart sounds: Normal heart sounds.  Pulmonary:     Breath sounds: Normal breath sounds. Decreased air movement present.  Neurological:     General: No focal deficit present.     Mental Status: Mental status is at baseline.  Psychiatric:        Behavior: Behavior normal. Behavior is cooperative.     PAST MEDICAL/SURGICAL HISTORY:  Past Medical History:  Diagnosis Date   Broken humerus 02/2022   right   Coronary artery calcification seen on CT scan    Essential hypertension    Fibromyalgia    Idiopathic pulmonary fibrosis (HCC)    Mixed hyperlipidemia    Osteoporosis    Type 2 diabetes mellitus (HCC)    Past Surgical History:  Procedure Laterality Date   ABDOMINAL HYSTERECTOMY  2007   CATARACT EXTRACTION Bilateral     SOCIAL HISTORY:  Social History   Socioeconomic History   Marital status: Married    Spouse name: Not on file   Number of children: Not on file   Years of education: Not on file   Highest education level: Not on file  Occupational History   Not on file  Tobacco Use   Smoking status: Never    Passive exposure: Past   Smokeless tobacco: Never  Vaping Use   Vaping status: Never Used  Substance and Sexual  Activity   Alcohol use: Not Currently    Comment: ocassional   Drug use: No   Sexual activity: Not on file  Other Topics Concern   Not on file  Social History Narrative   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   Social Drivers of Corporate investment banker Strain: Not on file  Food Insecurity: No Food Insecurity (05/05/2022)   Hunger Vital Sign    Worried About Running Out of Food in the Last Year: Never true    Ran Out of Food in the Last Year: Never true  Transportation Needs: No Transportation Needs (05/05/2022)   PRAPARE - Administrator, Civil Service (Medical): No    Lack of Transportation (Non-Medical): No  Physical Activity: Not on file  Stress: Not on file  Social Connections: Not on file  Intimate Partner Violence: Not At Risk (05/05/2022)   Humiliation, Afraid, Rape, and Kick questionnaire    Fear of Current or Ex-Partner: No    Emotionally Abused: No    Physically Abused: No  Sexually Abused: No    FAMILY HISTORY:  Family History  Problem Relation Age of Onset   Hypertension Mother    Diabetes Mother    Osteoporosis Mother    Hypertension Father    Heart disease Father    COPD Father    Colon cancer Sister    Colon cancer Brother    Heart disease Brother    Cancer Brother    Heart disease Brother    Emphysema Paternal Grandfather     CURRENT MEDICATIONS:  Outpatient Encounter Medications as of 03/06/2023  Medication Sig   ACCU-CHEK AVIVA PLUS test strip    Accu-Chek Softclix Lancets lancets    albuterol (VENTOLIN HFA) 108 (90 Base) MCG/ACT inhaler Inhale 2 puffs into the lungs every 4 (four) hours as needed for wheezing or shortness of breath.   aspirin EC 81 MG tablet Take 81 mg by mouth daily. Swallow whole.   Azelastine HCl 137 MCG/SPRAY SOLN    Calcium Carbonate (CALTRATE 600 PO) Take 1 tablet by mouth 2 (two) times a day.   clonazePAM (KLONOPIN) 0.5 MG tablet Take 0.5 mg by mouth at bedtime.   cyanocobalamin (VITAMIN  B12) 1000 MCG tablet Take 1,000 mcg by mouth daily.   ESBRIET 801 MG TABS Take 1 tablet (801 mg total) by mouth 3 (three) times daily with meals.   fenofibrate (TRICOR) 48 MG tablet Take 48 mg by mouth at bedtime.   Fluticasone Furoate (ARNUITY ELLIPTA) 100 MCG/ACT AEPB Inhale 1 puff into the lungs daily.   levocetirizine (XYZAL) 5 MG tablet Take 5 mg by mouth daily.   losartan (COZAAR) 50 MG tablet Take 50 mg by mouth daily.   metFORMIN (GLUCOPHAGE-XR) 500 MG 24 hr tablet Take 500 mg by mouth daily with breakfast.    metoprolol succinate (TOPROL-XL) 100 MG 24 hr tablet Take 100 mg by mouth daily. Take with or immediately following a meal.   omeprazole (PRILOSEC) 20 MG capsule Take 1 capsule (20 mg total) by mouth daily.   REPATHA SURECLICK 140 MG/ML SOAJ Inject 1 mL into the skin every 14 (fourteen) days.   SAVELLA 50 MG TABS tablet Take 50 mg by mouth 2 (two) times daily.    spironolactone (ALDACTONE) 25 MG tablet take (1/2) tablet by mouth daily.   tiZANidine (ZANAFLEX) 2 MG tablet Take 2 mg by mouth 2 (two) times daily.   [DISCONTINUED] tiZANidine (ZANAFLEX) 4 MG tablet TAKE (1) TABLET EVERY NIGHT AT BEDTIME.   No facility-administered encounter medications on file as of 03/06/2023.    ALLERGIES:  Allergies  Allergen Reactions   Latex    Pseudoephedrine Other (See Comments)    Rash and hallucinations    Sulfa Antibiotics    Sulfonamide Derivatives Rash    LABORATORY DATA:  I have reviewed the labs as listed.  CBC    Component Value Date/Time   WBC 9.9 02/13/2023 1333   RBC 4.33 02/13/2023 1333   HGB 13.2 02/13/2023 1333   HCT 40.3 02/13/2023 1333   PLT 493 (H) 02/13/2023 1333   MCV 93.1 02/13/2023 1333   MCH 30.5 02/13/2023 1333   MCHC 32.8 02/13/2023 1333   RDW 12.2 02/13/2023 1333   LYMPHSABS 2.1 10/03/2022 0931   MONOABS 0.7 10/03/2022 0931   EOSABS 218 02/13/2023 1333   BASOSABS 109 02/13/2023 1333      Latest Ref Rng & Units 02/13/2023    1:33 PM 10/03/2022     9:31 AM 09/05/2022   12:00 PM  CMP  Glucose  65 - 99 mg/dL 147  829    BUN 7 - 25 mg/dL 15  12    Creatinine 5.62 - 1.00 mg/dL 1.30  8.65    Sodium 784 - 146 mmol/L 141  139    Potassium 3.5 - 5.3 mmol/L 4.6  4.3    Chloride 98 - 110 mmol/L 104  106    CO2 20 - 32 mmol/L 28  25    Calcium 8.6 - 10.4 mg/dL 9.7  9.7    Total Protein 6.1 - 8.1 g/dL 7.0  7.2  7.5   Total Bilirubin 0.2 - 1.2 mg/dL 0.3  0.5  0.2   Alkaline Phos 38 - 126 U/L  79  83   AST 10 - 35 U/L 20  20  19    ALT 6 - 29 U/L 19  17  16      DIAGNOSTIC IMAGING:  I have independently reviewed the relevant imaging and discussed with the patient.   WRAP UP:  All questions were answered. The patient knows to call the clinic with any problems, questions or concerns.  Medical decision making: Moderate  Time spent on visit: I spent 20 minutes counseling the patient face to face. The total time spent in the appointment was 30 minutes and more than 50% was on counseling.  Carnella Guadalajara, PA-C  03/06/23 4:44 PM

## 2023-03-06 ENCOUNTER — Inpatient Hospital Stay: Payer: Medicare Other | Attending: Physician Assistant | Admitting: Physician Assistant

## 2023-03-06 VITALS — BP 149/67 | HR 80 | Temp 97.8°F | Resp 18 | Ht 65.0 in | Wt 169.2 lb

## 2023-03-06 DIAGNOSIS — D6852 Prothrombin gene mutation: Secondary | ICD-10-CM | POA: Diagnosis not present

## 2023-03-06 DIAGNOSIS — Z7901 Long term (current) use of anticoagulants: Secondary | ICD-10-CM | POA: Diagnosis not present

## 2023-03-06 DIAGNOSIS — I10 Essential (primary) hypertension: Secondary | ICD-10-CM | POA: Diagnosis not present

## 2023-03-06 DIAGNOSIS — E119 Type 2 diabetes mellitus without complications: Secondary | ICD-10-CM | POA: Insufficient documentation

## 2023-03-06 DIAGNOSIS — R519 Headache, unspecified: Secondary | ICD-10-CM | POA: Insufficient documentation

## 2023-03-06 DIAGNOSIS — Z800 Family history of malignant neoplasm of digestive organs: Secondary | ICD-10-CM | POA: Insufficient documentation

## 2023-03-06 DIAGNOSIS — Z882 Allergy status to sulfonamides status: Secondary | ICD-10-CM | POA: Insufficient documentation

## 2023-03-06 DIAGNOSIS — R059 Cough, unspecified: Secondary | ICD-10-CM | POA: Diagnosis not present

## 2023-03-06 DIAGNOSIS — Z8249 Family history of ischemic heart disease and other diseases of the circulatory system: Secondary | ICD-10-CM | POA: Insufficient documentation

## 2023-03-06 DIAGNOSIS — Z90710 Acquired absence of both cervix and uterus: Secondary | ICD-10-CM | POA: Insufficient documentation

## 2023-03-06 DIAGNOSIS — D6862 Lupus anticoagulant syndrome: Secondary | ICD-10-CM | POA: Insufficient documentation

## 2023-03-06 DIAGNOSIS — E782 Mixed hyperlipidemia: Secondary | ICD-10-CM | POA: Diagnosis not present

## 2023-03-06 DIAGNOSIS — J84112 Idiopathic pulmonary fibrosis: Secondary | ICD-10-CM | POA: Diagnosis not present

## 2023-03-06 DIAGNOSIS — R7989 Other specified abnormal findings of blood chemistry: Secondary | ICD-10-CM | POA: Diagnosis not present

## 2023-03-06 DIAGNOSIS — Z79899 Other long term (current) drug therapy: Secondary | ICD-10-CM | POA: Insufficient documentation

## 2023-03-06 DIAGNOSIS — Z825 Family history of asthma and other chronic lower respiratory diseases: Secondary | ICD-10-CM | POA: Insufficient documentation

## 2023-03-06 DIAGNOSIS — Z833 Family history of diabetes mellitus: Secondary | ICD-10-CM | POA: Insufficient documentation

## 2023-03-06 DIAGNOSIS — G479 Sleep disorder, unspecified: Secondary | ICD-10-CM | POA: Diagnosis not present

## 2023-03-06 DIAGNOSIS — I2510 Atherosclerotic heart disease of native coronary artery without angina pectoris: Secondary | ICD-10-CM | POA: Insufficient documentation

## 2023-03-06 DIAGNOSIS — Z86718 Personal history of other venous thrombosis and embolism: Secondary | ICD-10-CM

## 2023-03-06 DIAGNOSIS — Z8262 Family history of osteoporosis: Secondary | ICD-10-CM | POA: Diagnosis not present

## 2023-03-06 DIAGNOSIS — Z86711 Personal history of pulmonary embolism: Secondary | ICD-10-CM | POA: Diagnosis not present

## 2023-03-06 DIAGNOSIS — R5383 Other fatigue: Secondary | ICD-10-CM | POA: Insufficient documentation

## 2023-03-06 DIAGNOSIS — M79604 Pain in right leg: Secondary | ICD-10-CM | POA: Diagnosis not present

## 2023-03-06 DIAGNOSIS — R0602 Shortness of breath: Secondary | ICD-10-CM | POA: Insufficient documentation

## 2023-03-06 DIAGNOSIS — Z8616 Personal history of COVID-19: Secondary | ICD-10-CM | POA: Insufficient documentation

## 2023-03-06 NOTE — Patient Instructions (Addendum)
[facility] **VISIT SUMMARY & IMPORTANT INSTRUCTIONS **   You were seen today by Rojelio Brenner PA-C for your history of blood clots.    You were referred back to our office due to an elevated D-Dimer that was detected by your pulmonologist. D-dimer is a lab value that can be elevated in the setting of acute blood clots. It can also be elevated due to inflammation and infection. Since you are not having any symptoms concerning for new blood clots, and are being actively treated for sinus infection, we do not suspect that your D-dimer is elevated related to blood clots at this time. Will recheck your D-dimer in 3 months and see you for follow-up visit at that time.  For the time being, we do not recommend restarting blood thinner based on the following reasoning: Your initial blood clots were considered provoked in the setting of COVID-19 and decreased mobility following fall and left humerus fracture. Your blood clots resolved (as shown on ultrasound and CT scan) following 6 months of treatment with Eliquis. You do not have any major persistent risk factors for recurrent blood clots.  Indefinite therapy is discouraged by American Society Of hematology if there is a convincing reversible risk factor that provoked the initial blood clot. Having had previous blood clots does place you at increased risk of blood clots in the future, but you are still considered relatively low risk (<5% within 1 year and <15% within 5 years). The above criteria were discussed at length with supervising physician (Dr. Ellin Saba), who agrees.  However, there can be differences of clinical judgment across various medical specialties, and if one of your other specialists is leaning toward indefinite anticoagulation, we would not dissuade that.  Continue to remain vigilant for any symptoms of recurrent blood clots and seek IMMEDIATE MEDICAL ATTENTION if you notice any of the  following: New onset one-sided leg swelling, pain, or redness. New shortness of breath Chest pain Coughing up blood Suddenly feeling weak or faint Elevated heart rate   - - - - - - - - - - - - - - - - - -   ** Thank you for trusting me with your healthcare!  I strive to provide all of my patients with quality care at each visit.  If you receive a survey for this visit, I would be so grateful to you for taking the time to provide feedback.  Thank you in advance!  ~ Sheliah Fiorillo                   Dr. Doreatha Massed   &   Rojelio Brenner, PA-C   - - - - - - - - - - - - - - - - - -    Thank you for choosing [facility] at [facility] to provide your oncology and hematology care.  To afford each patient quality time with our provider, please arrive at least 15 minutes before your scheduled appointment time.   If you have a lab appointment with the Cancer Center please come in thru the Main Entrance and check in at the main information desk.  You need to re-schedule your appointment should you arrive 10 or more minutes late.  We strive to give you quality time with our providers, and arriving late affects you and other patients whose appointments are after yours.  Also, if you no show three or more times for appointments you may be  dismissed from the clinic at the providers discretion.     Again, thank you for choosing [facility].  Our hope is that these requests will decrease the amount of time that you wait before being seen by our physicians.       _____________________________________________________________  Should you have questions after your visit to [facility], please contact our office at [phone] and follow the prompts.  Our office hours are 8:00 a.m. and 4:30 p.m. Monday - Friday.  Please note that voicemails left after 4:00 p.m. may not be returned until the following business day.  We are closed weekends and major holidays.   You do have access to a nurse 24-7, just call the main number to the clinic [phone] and do not press any options, hold on the line and a nurse will answer the phone.    For prescription refill requests, have your pharmacy contact our office and allow 72 hours.

## 2023-03-07 ENCOUNTER — Other Ambulatory Visit: Payer: Self-pay | Admitting: Physician Assistant

## 2023-03-07 NOTE — Telephone Encounter (Signed)
Last Fill: 07/27/2020  Next Visit: 08/15/2022  Last Visit: 02/13/2023  Dx: Fibromyalgia   Current Dose per office note on 02/13/2023: not discussed  Okay to refill Tizanidine?

## 2023-03-15 DIAGNOSIS — Z23 Encounter for immunization: Secondary | ICD-10-CM | POA: Diagnosis not present

## 2023-04-03 ENCOUNTER — Telehealth: Payer: Self-pay | Admitting: Pharmacist

## 2023-04-03 DIAGNOSIS — J84112 Idiopathic pulmonary fibrosis: Secondary | ICD-10-CM

## 2023-04-03 NOTE — Telephone Encounter (Signed)
 Enrolled patient into PF grant through PAF: Award Period: 10/05/2022 - 04/02/2024 Cardholder: [id number] BIN: [id number] PCN: [id number] Group: [id number] For pharmacy inquiries, contact PDMI at [phone]. Amount $4000  Please run pirfenidone  BIV (no longer eligible for [employer] PAP)  Sherry Pennant, PharmD, MPH, BCPS, CPP Clinical Pharmacist (Rheumatology and Pulmonology)

## 2023-04-07 NOTE — Telephone Encounter (Signed)
Submitted a Prior Authorization request to [facility] for PIRFENIDONE via CoverMyMeds. Will update once we receive a response.  Key: [id number]

## 2023-04-09 ENCOUNTER — Other Ambulatory Visit: Payer: Self-pay | Admitting: Rheumatology

## 2023-04-09 NOTE — Telephone Encounter (Signed)
Contacted the patient and patient states she is taking two tablets at bedtime. Patient states she was taking the 4 mg tablets but the pharmacy had to change to 2 mg tablets and that is why she takes two tablets at bedtime.

## 2023-04-09 NOTE — Telephone Encounter (Signed)
Last Fill: 03/07/2023  Next Visit: 08/15/2023  Last Visit: 02/13/2023  Dx: Fibromyalgia   Current Dose per office note on 02/13/2023: not discussed  Okay to refill Tizanidine?

## 2023-04-09 NOTE — Telephone Encounter (Signed)
Attempted to contact the patient and left message for patient to call the office.  

## 2023-04-09 NOTE — Telephone Encounter (Signed)
Please clarify how she is taking tizanidine? 1 or 2 tablets at bedtime?

## 2023-04-13 MED ORDER — PIRFENIDONE 801 MG PO TABS
801.0000 mg | ORAL_TABLET | Freq: Three times a day (TID) | ORAL | 1 refills | Status: DC
Start: 2023-04-13 — End: 2023-12-19
  Filled 2023-05-21: qty 90, 30d supply, fill #0
  Filled 2023-06-13: qty 90, 30d supply, fill #1
  Filled 2023-07-13: qty 90, 30d supply, fill #2
  Filled 2023-08-30: qty 90, 30d supply, fill #3
  Filled 2023-10-10: qty 90, 30d supply, fill #4
  Filled 2023-11-08 – 2023-11-14 (×2): qty 90, 30d supply, fill #5

## 2023-04-13 NOTE — Telephone Encounter (Signed)
Received notification from [facility] regarding a prior authorization for PIRFENIDONE. Authorization has been APPROVED from 04/07/2023 to 03/19/2024. Approval letter sent to scan center.  Patient can fill through [facility]: [phone]   Authorization # [id number]  Patient reports having 2 full bottles of Esbriet. Advised that Mills Koller will reach out in early March to schedule first shpiment of pirfenidone to home from [facility], PharmD, MPH, BCPS, CPP Clinical Pharmacist (Rheumatology and Pulmonology)

## 2023-05-07 ENCOUNTER — Other Ambulatory Visit: Payer: Self-pay | Admitting: Physician Assistant

## 2023-05-07 ENCOUNTER — Other Ambulatory Visit: Payer: Self-pay | Admitting: Nurse Practitioner

## 2023-05-07 NOTE — Telephone Encounter (Signed)
 Last Fill: 04/09/2023   Next Visit: 08/15/2023   Last Visit: 02/13/2023   Dx: Fibromyalgia    Current Dose per office note on 02/13/2023: not discussed   Okay to refill Tizanidine?

## 2023-05-08 ENCOUNTER — Ambulatory Visit: Payer: Medicare Other | Admitting: Internal Medicine

## 2023-05-09 ENCOUNTER — Ambulatory Visit: Payer: Medicare Other | Admitting: Cardiology

## 2023-05-09 ENCOUNTER — Other Ambulatory Visit: Payer: Self-pay | Admitting: Pharmacist

## 2023-05-09 NOTE — Progress Notes (Signed)
 Patient has been on pirfenidone for IPF for some time. No longer eligible for patient assistance program. Enrolled into pulmonary fibrosis grant.  Continue pirfenidone 801mg  three times daily  Chesley Mires, PharmD, MPH, BCPS, CPP Clinical Pharmacist (Rheumatology and Pulmonology)

## 2023-05-16 DIAGNOSIS — Z23 Encounter for immunization: Secondary | ICD-10-CM | POA: Diagnosis not present

## 2023-05-21 ENCOUNTER — Other Ambulatory Visit: Payer: Self-pay

## 2023-05-21 ENCOUNTER — Other Ambulatory Visit (HOSPITAL_COMMUNITY): Payer: Self-pay

## 2023-05-21 DIAGNOSIS — R059 Cough, unspecified: Secondary | ICD-10-CM | POA: Diagnosis not present

## 2023-05-21 DIAGNOSIS — R0981 Nasal congestion: Secondary | ICD-10-CM | POA: Diagnosis not present

## 2023-05-21 DIAGNOSIS — R030 Elevated blood-pressure reading, without diagnosis of hypertension: Secondary | ICD-10-CM | POA: Diagnosis not present

## 2023-05-21 NOTE — Progress Notes (Signed)
 Specialty Pharmacy Initial Fill Coordination Note  Nichole Reyes is a 73 y.o. female contacted today regarding initial fill of specialty medication(s) Pirfenidone   Patient requested Delivery   Delivery date: 05/23/23   Verified address: [address]  [place] [address]   Medication will be filled on 05/22/23.   Patient has grant on file and is aware of $0 copayment.   **NOTE** Pt requests special delivery instructions to be added: "If no one answers door at time of delivery, please leave package at backdoor next to the grill in the carport."

## 2023-05-22 ENCOUNTER — Other Ambulatory Visit: Payer: Self-pay

## 2023-06-07 ENCOUNTER — Other Ambulatory Visit: Payer: Self-pay | Admitting: Rheumatology

## 2023-06-07 NOTE — Telephone Encounter (Signed)
 Last Fill: 05/07/2023  Next Visit: 08/15/2023  Last Visit: 02/13/2023  Dx: not mentioned  Current Dose per office note on 02/13/2023: not mentioned  Okay to refill Tizanidine?

## 2023-06-11 ENCOUNTER — Other Ambulatory Visit: Payer: Self-pay

## 2023-06-11 NOTE — Progress Notes (Unsigned)
 [facility] [address], [place] [address]   CLINIC:  Medical Oncology/Hematology  PCP:  Lianne Moris, PA-C [address] [place] [address] [phone]   REASON FOR VISIT:  Follow-up for provoked right leg DVT and PE  PRIOR THERAPY: Eliquis  CURRENT THERAPY: None  INTERVAL HISTORY:   [name] 73 y.o. female was previously seen at our clinic for provoked DVT/PE in the setting of COVID-19.  Patient was last seen by Rojelio Brenner PA-C on 03/06/2023, after she was referred back to our clinic for slight increase in D-dimer that was noted in December 2024.  Since last visit, she has not had any diagnosis of new DVT or PE.  *** *** She has not resumed any anticoagulation.  *** *** She continues to deny any new-onset leg swelling or erythema.  She does report some throbbing pain in her right leg that has been chronic and intermittent for "many years," predating her prior blood clots.  *** *** She has baseline shortness of breath and dyspnea on exertion related to her pulmonary fibrosis. *** She denies any chest pain, hemoptysis, palpitations, or lightheadedness. *** Recent infection inflammation?  (If D-dimer is elevated?) She has 60***% energy and 70***% appetite. She endorses that she is maintaining a stable weight.  ASSESSMENT & PLAN:  1.  DVT and pulmonary embolism, provoked: - COVID infection 02/11/2022, fever and taste altered, treated with Paxlovid - Left humerus fracture (03/09/2022) from mechanical fall while walking her dog.  After the fracture, she was wearing a sling and mostly stayed on the couch. - CT angio (03/17/2022): Acute PE with moderate embolic burden.  Evidence of right heart strain consistent with at least submassive PE. - Doppler (03/19/2022): Acute DVT limited to the right gastrocnemius calf vein.  No evidence of left lower extremity DVT. - It appears that she has at least 2 transient provoking factors including COVID infection  and immobility from fall and left arm fracture. - Coagulopathy testing was negative (checked for lupus anticoagulant, anticardiolipin antibodies, antibeta 2 glycoprotein 1 antibodies, factor V Leiden, prothrombin gene mutation) - Patient was treated with 72-month course of Eliquis due to large clot burden - Venous US right lower extremity (10/03/2022):  No evidence of DVT or SVT in right leg, with particular attention paid to the right gastrocnemius vein - CTA Chest (10/03/2022):  No evidence of pulmonary embolism - She completed 6 months of Eliquis (December 2023 through July 2024) - 36-month duration of anticoagulation was recommended by Dr. Ellin Saba due to clot burden - Eliquis was discontinued in July 2024 based on the following: Complete resolution of DVT and PE based on imaging from 10/03/2022 Resolution of initial provoking risk factors (COVID infection and immobility from fall/fracture) Negative coagulopathy testing - Pulmonologist noted slightly increased D-dimer 0.67 on 02/20/2023, which was increased from D-dimer 0.35 on 10/03/2022.  *** Patient had sinus infection at the time, was on antibiotics.  *** - D-dimer today *** - Patient denies any new onset unilateral leg swelling, chest pain, hemoptysis, or dyspnea.*** - Physical exam not concerning for any acute DVT or PE.*** - PLAN: *** TBD *** - Patient case discussed extensively with Dr. Ellin Saba, who does not recommend for indefinite anticoagulation at this time.  (See "DISCUSSION" below.) - Although D-dimer has trended slightly upward after discontinuation of Eliquis, patient is still within her age-adjusted D-dimer cutoff at <0.7 mcg/mL.  Elevated D-dimer likely influenced by concurrent acute sinus infection being treated with antibiotics. - We will  check repeat D-dimer in 3 months and see patient for follow-up visit and further discussion. - Patient is aware that prior blood clots (even provoked) do slightly increase her risk for future  blood clots, although there is no strong indication for long-term anticoagulation at this time. - If she has any recurrent clots, she would likely need indefinite anticoagulation.  DISCUSSION: Per Up-To-Date, reduced mobility at home for 3+ days is considered a minor transient risk factor.  Per Clinical Key, severe COVID-19 is considered a major transient risk factor for venous thromboembolism.  Mild COVID-19 infections are considered a minor transient risk factor.  Having two or more minor transient risk factors can synergistically increase the risk of VTE.  DASH Prediction Score and HERDOO2 Rule not relevant, as intended only for unprovoked VTE.  Per Up-To-Date article titled "Selecting Adult Patients with Lower Extremity DVT and PE for Indefinite Anticoagulation", there are differing schools of thought among experts regarding indefinite anticoagulation in patients with first episode of VTE due to transient minor risk factor.  "Some experts favor indefinite anticoagulation in this population based upon the premise that VTE and the presence of a minor risk factor may represent a propensity for thrombosis.  However others prefer not to indefinitely anticoagulate this population.  In such situations, use of additional factors and the patient's preference is particularly important in helping the patient and clinician come to a decision."  Discussed with patient that according to [facility] ([employer] of Hemat[facility]), "risk of recurrence in patients with VTE provoked by nonsurgical trigger is higher than in patients with VTE provoked by surgery, the risk is still low and is estimated at 5% within 1 year and 15% within 5 years.Marland KitchenMarland KitchenWe discourage indefinite therapy if there is a convincing reversible risk factor" [whether surgical or nonsurgical].  (NOTE: This recommendation against indefinite anticoagulation is considered weaker in patients with nonsurgical provoking factors compared to surgical provoking  factors.)  Considering the following, we do not see strong indication for indefinite anticoagulation at this time: Patient had 2 compounding transient minor risk factors (reduced mobility following fall/left humerus fracture and mild COVID-19 infection) Patient is female Patient had proven resolution of DVT/PE Patient does not have any persistent risk factors (apart from now being s/p first episode VTE) Patient does not have any family history of VTE. Mildly elevated D-dimer (still within age-adjusted normal limits of < 0.70) in the setting of acute sinus infection is not convincing of need for additional anticoagulation at this time. Patient does not have any current physical exam findings or symptoms concerning for DVT/PE.  However, patient values and preferences, as well as clinical judgment of other clinicians must be taken into account.  If other specialists favor indefinite anticoagulation, we would not prohibit this from hematology standpoint.     2.  Pulmonary fibrosis: - She is on pirfenidone.  She lost 10 pounds since she was started on it. - Uses Flovent twice daily and albuterol.  3.  Social/family history: - Lives at home with her husband.  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  Later she went on disability secondary to fibromyalgia.  She is non-smoker. - No family history of DVTs or miscarriages.  Sister died of colon cancer.  Brother had colon cancer.  PLAN SUMMARY:***TBD*** >> Same-day labs (D-dimer) + OFFICE visit in 3 months     REVIEW OF SYSTEMS: ***  Review of Systems  Constitutional:  Positive for fatigue. Negative for appetite change, chills, diaphoresis, fever and unexpected  weight change.  HENT:   Negative for lump/mass and nosebleeds.   Eyes:  Negative for eye problems.  Respiratory:  Positive for cough and shortness of breath. Negative for hemoptysis.   Cardiovascular:  Negative for chest pain, leg swelling and palpitations.   Gastrointestinal:  Positive for nausea. Negative for abdominal pain, blood in stool, constipation, diarrhea and vomiting.  Genitourinary:  Negative for frequency and hematuria.   Skin: Negative.   Neurological:  Positive for headaches. Negative for dizziness and light-headedness.  Hematological:  Does not bruise/bleed easily.  Psychiatric/Behavioral:  Positive for sleep disturbance.      PHYSICAL EXAM:  ECOG PERFORMANCE STATUS: 0 - Asymptomatic *** There were no vitals filed for this visit.  There were no vitals filed for this visit.  Physical Exam Constitutional:      Appearance: Normal appearance. She is normal weight.  Cardiovascular:     Heart sounds: Normal heart sounds.  Pulmonary:     Breath sounds: Normal breath sounds. Decreased air movement present.  Neurological:     General: No focal deficit present.     Mental Status: Mental status is at baseline.  Psychiatric:        Behavior: Behavior normal. Behavior is cooperative.    PAST MEDICAL/SURGICAL HISTORY:  Past Medical History:  Diagnosis Date   Broken humerus 02/2022   right   Coronary artery calcification seen on CT scan    Essential hypertension    Fibromyalgia    Idiopathic pulmonary fibrosis (HCC)    Mixed hyperlipidemia    Osteoporosis    Type 2 diabetes mellitus (HCC)    Past Surgical History:  Procedure Laterality Date   ABDOMINAL HYSTERECTOMY  2007   CATARACT EXTRACTION Bilateral     SOCIAL HISTORY:  Social History   Socioeconomic History   Marital status: Married    Spouse name: Not on file   Number of children: Not on file   Years of education: Not on file   Highest education level: Not on file  Occupational History   Not on file  Tobacco Use   Smoking status: Never    Passive exposure: Past   Smokeless tobacco: Never  Vaping Use   Vaping status: Never Used  Substance and Sexual Activity   Alcohol use: Not Currently    Comment: ocassional   Drug use: No   Sexual activity: Not  on file  Other Topics Concern   Not on file  Social History Narrative   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   Social Drivers of Corporate investment banker Strain: Not on file  Food Insecurity: No Food Insecurity (05/05/2022)   Hunger Vital Sign    Worried About Running Out of Food in the Last Year: Never true    Ran Out of Food in the Last Year: Never true  Transportation Needs: No Transportation Needs (05/05/2022)   PRAPARE - Administrator, Civil Service (Medical): No    Lack of Transportation (Non-Medical): No  Physical Activity: Not on file  Stress: Not on file  Social Connections: Not on file  Intimate Partner Violence: Not At Risk (05/05/2022)   Humiliation, Afraid, Rape, and Kick questionnaire    Fear of Current or Ex-Partner: No    Emotionally Abused: No    Physically Abused: No    Sexually Abused: No    FAMILY HISTORY:  Family History  Problem Relation Age of Onset   Hypertension Mother    Diabetes Mother  Osteoporosis Mother    Hypertension Father    Heart disease Father    COPD Father    Colon cancer Sister    Colon cancer Brother    Heart disease Brother    Cancer Brother    Heart disease Brother    Emphysema Paternal Grandfather     CURRENT MEDICATIONS:  Outpatient Encounter Medications as of 06/12/2023  Medication Sig   ACCU-CHEK AVIVA PLUS test strip    Accu-Chek Softclix Lancets lancets    albuterol (VENTOLIN HFA) 108 (90 Base) MCG/ACT inhaler Inhale 2 puffs into the lungs every 4 (four) hours as needed for wheezing or shortness of breath.   aspirin EC 81 MG tablet Take 81 mg by mouth daily. Swallow whole.   Azelastine HCl 137 MCG/SPRAY SOLN    Calcium Carbonate (CALTRATE 600 PO) Take 1 tablet by mouth 2 (two) times a day.   clonazePAM (KLONOPIN) 0.5 MG tablet Take 0.5 mg by mouth at bedtime.   cyanocobalamin (VITAMIN B12) 1000 MCG tablet Take 1,000 mcg by mouth daily.   fenofibrate (TRICOR) 48 MG tablet Take 48 mg by  mouth at bedtime.   Fluticasone Furoate (ARNUITY ELLIPTA) 100 MCG/ACT AEPB Inhale 1 puff into the lungs daily.   levocetirizine (XYZAL) 5 MG tablet Take 5 mg by mouth daily.   losartan (COZAAR) 50 MG tablet Take 50 mg by mouth daily.   metFORMIN (GLUCOPHAGE-XR) 500 MG 24 hr tablet Take 500 mg by mouth daily with breakfast.    metoprolol succinate (TOPROL-XL) 100 MG 24 hr tablet Take 100 mg by mouth daily. Take with or immediately following a meal.   omeprazole (PRILOSEC) 20 MG capsule Take 1 capsule (20 mg total) by mouth daily.   Pirfenidone (ESBRIET) 801 MG TABS Take 1 tablet (801 mg total) by mouth 3 (three) times daily with meals.   REPATHA SURECLICK 140 MG/ML SOAJ Inject 1 mL into the skin every 14 (fourteen) days.   SAVELLA 50 MG TABS tablet Take 50 mg by mouth 2 (two) times daily.    spironolactone (ALDACTONE) 25 MG tablet take (1/2) tablet by mouth daily.   tiZANidine (ZANAFLEX) 2 MG tablet take (2) tablets every night at bedtime.   No facility-administered encounter medications on file as of 06/12/2023.    ALLERGIES:  Allergies  Allergen Reactions   Latex    Pseudoephedrine Other (See Comments)    Rash and hallucinations    Sulfa Antibiotics    Sulfonamide Derivatives Rash    LABORATORY DATA:  I have reviewed the labs as listed.  CBC    Component Value Date/Time   WBC 9.9 02/13/2023 1333   RBC 4.33 02/13/2023 1333   HGB 13.2 02/13/2023 1333   HCT 40.3 02/13/2023 1333   PLT 493 (H) 02/13/2023 1333   MCV 93.1 02/13/2023 1333   MCH 30.5 02/13/2023 1333   MCHC 32.8 02/13/2023 1333   RDW 12.2 02/13/2023 1333   LYMPHSABS 2.1 10/03/2022 0931   MONOABS 0.7 10/03/2022 0931   EOSABS 218 02/13/2023 1333   BASOSABS 109 02/13/2023 1333      Latest Ref Rng & Units 02/13/2023    1:33 PM 10/03/2022    9:31 AM 09/05/2022   12:00 PM  CMP  Glucose 65 - 99 mg/dL 478  295    BUN 7 - 25 mg/dL 15  12    Creatinine 6.21 - 1.00 mg/dL 3.08  6.57    Sodium 846 - 146 mmol/L 141  139     Potassium 3.5 -  5.3 mmol/L 4.6  4.3    Chloride 98 - 110 mmol/L 104  106    CO2 20 - 32 mmol/L 28  25    Calcium 8.6 - 10.4 mg/dL 9.7  9.7    Total Protein 6.1 - 8.1 g/dL 7.0  7.2  7.5   Total Bilirubin 0.2 - 1.2 mg/dL 0.3  0.5  0.2   Alkaline Phos 38 - 126 U/L  79  83   AST 10 - 35 U/L 20  20  19    ALT 6 - 29 U/L 19  17  16      DIAGNOSTIC IMAGING:  I have independently reviewed the relevant imaging and discussed with the patient.   WRAP UP:  All questions were answered. The patient knows to call the clinic with any problems, questions or concerns.  Medical decision making: Moderate***  Time spent on visit: I spent 20*** minutes counseling the patient face to face. The total time spent in the appointment was 30*** minutes and more than 50% was on counseling.  Carnella Guadalajara, PA-C  ***

## 2023-06-12 ENCOUNTER — Inpatient Hospital Stay: Payer: Medicare Other

## 2023-06-12 ENCOUNTER — Inpatient Hospital Stay: Payer: Medicare Other | Attending: Physician Assistant | Admitting: Physician Assistant

## 2023-06-12 VITALS — BP 122/62 | HR 72 | Temp 97.8°F | Resp 16 | Wt 165.6 lb

## 2023-06-12 DIAGNOSIS — Z8262 Family history of osteoporosis: Secondary | ICD-10-CM | POA: Diagnosis not present

## 2023-06-12 DIAGNOSIS — E782 Mixed hyperlipidemia: Secondary | ICD-10-CM | POA: Diagnosis not present

## 2023-06-12 DIAGNOSIS — E119 Type 2 diabetes mellitus without complications: Secondary | ICD-10-CM | POA: Diagnosis not present

## 2023-06-12 DIAGNOSIS — Z882 Allergy status to sulfonamides status: Secondary | ICD-10-CM | POA: Diagnosis not present

## 2023-06-12 DIAGNOSIS — Z800 Family history of malignant neoplasm of digestive organs: Secondary | ICD-10-CM | POA: Diagnosis not present

## 2023-06-12 DIAGNOSIS — Z809 Family history of malignant neoplasm, unspecified: Secondary | ICD-10-CM | POA: Diagnosis not present

## 2023-06-12 DIAGNOSIS — Z86718 Personal history of other venous thrombosis and embolism: Secondary | ICD-10-CM | POA: Insufficient documentation

## 2023-06-12 DIAGNOSIS — Z86711 Personal history of pulmonary embolism: Secondary | ICD-10-CM

## 2023-06-12 DIAGNOSIS — D6852 Prothrombin gene mutation: Secondary | ICD-10-CM | POA: Diagnosis not present

## 2023-06-12 DIAGNOSIS — J84112 Idiopathic pulmonary fibrosis: Secondary | ICD-10-CM | POA: Insufficient documentation

## 2023-06-12 DIAGNOSIS — I2510 Atherosclerotic heart disease of native coronary artery without angina pectoris: Secondary | ICD-10-CM | POA: Insufficient documentation

## 2023-06-12 DIAGNOSIS — Z79899 Other long term (current) drug therapy: Secondary | ICD-10-CM | POA: Insufficient documentation

## 2023-06-12 DIAGNOSIS — Z90710 Acquired absence of both cervix and uterus: Secondary | ICD-10-CM | POA: Insufficient documentation

## 2023-06-12 DIAGNOSIS — Z825 Family history of asthma and other chronic lower respiratory diseases: Secondary | ICD-10-CM | POA: Diagnosis not present

## 2023-06-12 DIAGNOSIS — Z833 Family history of diabetes mellitus: Secondary | ICD-10-CM | POA: Insufficient documentation

## 2023-06-12 DIAGNOSIS — Z8249 Family history of ischemic heart disease and other diseases of the circulatory system: Secondary | ICD-10-CM | POA: Diagnosis not present

## 2023-06-12 DIAGNOSIS — I10 Essential (primary) hypertension: Secondary | ICD-10-CM | POA: Insufficient documentation

## 2023-06-12 LAB — D-DIMER, QUANTITATIVE: D-Dimer, Quant: 0.39 ug{FEU}/mL (ref 0.00–0.50)

## 2023-06-13 ENCOUNTER — Other Ambulatory Visit: Payer: Self-pay

## 2023-06-13 NOTE — Progress Notes (Signed)
 Specialty Pharmacy Refill Coordination Note  Nichole Reyes is a 73 y.o. female contacted today regarding refills of specialty medication(s) Pirfenidone   Patient requested Delivery   Delivery date: 06/18/23   Verified address: [address]  [place] [address]   Medication will be filled on 06/15/23.

## 2023-06-15 ENCOUNTER — Other Ambulatory Visit: Payer: Self-pay

## 2023-06-26 ENCOUNTER — Ambulatory Visit: Payer: Medicare Other | Admitting: Internal Medicine

## 2023-07-13 ENCOUNTER — Other Ambulatory Visit: Payer: Self-pay | Admitting: Pharmacy Technician

## 2023-07-13 ENCOUNTER — Ambulatory Visit: Payer: Medicare Other | Attending: Cardiology | Admitting: Cardiology

## 2023-07-13 ENCOUNTER — Other Ambulatory Visit: Payer: Self-pay

## 2023-07-13 ENCOUNTER — Encounter: Payer: Self-pay | Admitting: Cardiology

## 2023-07-13 VITALS — BP 124/62 | HR 105 | Ht 65.0 in | Wt 165.0 lb

## 2023-07-13 DIAGNOSIS — T466X5A Adverse effect of antihyperlipidemic and antiarteriosclerotic drugs, initial encounter: Secondary | ICD-10-CM

## 2023-07-13 DIAGNOSIS — T466X5D Adverse effect of antihyperlipidemic and antiarteriosclerotic drugs, subsequent encounter: Secondary | ICD-10-CM | POA: Diagnosis not present

## 2023-07-13 DIAGNOSIS — I10 Essential (primary) hypertension: Secondary | ICD-10-CM

## 2023-07-13 DIAGNOSIS — J84112 Idiopathic pulmonary fibrosis: Secondary | ICD-10-CM | POA: Diagnosis not present

## 2023-07-13 DIAGNOSIS — M791 Myalgia, unspecified site: Secondary | ICD-10-CM

## 2023-07-13 DIAGNOSIS — I2510 Atherosclerotic heart disease of native coronary artery without angina pectoris: Secondary | ICD-10-CM

## 2023-07-13 DIAGNOSIS — E782 Mixed hyperlipidemia: Secondary | ICD-10-CM | POA: Diagnosis not present

## 2023-07-13 NOTE — Progress Notes (Signed)
 Specialty Pharmacy Refill Coordination Note  Nichole Reyes is a 73 y.o. female contacted today regarding refills of specialty medication(s) Pirfenidone    Patient requested Delivery   Delivery date: 08/07/23   Verified address: [address] [place] [address]   Medication will be filled on 08/06/23.   Patient stated she have an extra month supply due to early refills previously. Will fill a later date.

## 2023-07-13 NOTE — Patient Instructions (Addendum)

## 2023-07-13 NOTE — Progress Notes (Signed)
    Cardiology Office Note  Date: 07/13/2023   ID: Nichole Reyes, DOB 03-24-1950, MRN [id number]  History of Present Illness: Nichole Reyes is a 73 y.o. female last seen in August 2024.  She is here for a routine visit.  Reports NYHA class 2-3 dyspnea, no exertional chest pain.  Does have a vague sense of very brief heart skips that sound like PVCs based on description.  She has had no sudden dizziness or syncope.  We went over her medications.  She reports compliance with therapy.  Current vascular regimen includes aspirin and Repatha.  She will have follow-up lipids with PCP this year.  She continues to follow with Pulmonary for management of IPF.  I reviewed her ECG today which shows sinus tachycardia with left anterior fascicular block and increased voltage.  Physical Exam: VS:  BP 124/62   Pulse (!) 105   Ht 5\' 5"  (1.651 m)   Wt 165 lb (74.8 kg)   SpO2 99%   BMI 27.46 kg/m , BMI Body mass index is 27.46 kg/m.  Wt Readings from Last 3 Encounters:  07/13/23 165 lb (74.8 kg)  06/12/23 165 lb 9.1 oz (75.1 kg)  03/06/23 169 lb 3.2 oz (76.7 kg)    General: Patient appears comfortable at rest. HEENT: Conjunctiva and lids normal. Neck: Supple, no elevated JVP or carotid bruits. Lungs: Clear to auscultation, nonlabored breathing at rest. Cardiac: Regular rate and rhythm, no S3 or significant systolic murmur. Extremities: No pitting edema.  ECG:  An ECG dated 04/28/2022 was personally reviewed today and demonstrated:  Sinus tachycardia with left anterior fascicular block, decreased R wave progression with increased voltage .  Labwork: 02/13/2023: ALT 19; AST 20; BUN 15; Creat 0.79; Hemoglobin 13.2; Platelets 493; Potassium 4.6; Sodium 141 02/20/2023: BNP 45.6   Other Studies Reviewed Today:  No interval cardiac testing for review today.  Assessment and Plan:  1.  Coronary artery calcification and aortic atherosclerosis evident by CT imaging.  Lexiscan  Myoview  in July  2022 was negative for ischemia with LVEF 68%.  She does not report any angina, ECG reviewed.  Plan to continue observation on medical therapy including aspirin 81 mg daily and Repatha 140 mg/mL every 14 days.   2.  Mixed hyperlipidemia with statin myalgias.  LDL 66 in May 2023.  She is due for follow-up lipids with PCP this year.  Continue Repatha 140 mg/mL every 14 days.   3.  Idiopathic pulmonary fibrosis followed by Pulmonary.  She continues on Esbriet .   4.  Primary hypertension.  Continue Cozaar  50 mg daily, Toprol -XL 100 mg daily, and Aldactone  12.5 mg daily.  Disposition:  Follow up  6 months.  Signed, Gerard Knight, M.D., F.A.C.C. [facility]

## 2023-07-25 ENCOUNTER — Other Ambulatory Visit: Payer: Self-pay | Admitting: Physician Assistant

## 2023-07-25 NOTE — Telephone Encounter (Signed)
 Last Fill: 06/07/2023  Next Visit: 08/15/2023  Last Visit: 02/13/2023  Dx:  Polyarthralgia   Current Dose per office note on 02/13/2023: She takes tizanidine  as needed for muscle spasms   Okay to refill Tizanidine ?

## 2023-08-02 NOTE — Progress Notes (Deleted)
 Office Visit Note  Patient: Nichole Reyes             Date of Birth: [birth year]           MRN: [id number]             PCP: Fredick Jarred, PA-C Referring: Fredick Jarred, PA-C Visit Date: 08/15/2023 Occupation: @GUAROCC @  Subjective:  No chief complaint on file.   History of Present Illness: Nichole Reyes is a 73 y.o. female ***     Activities of Daily Living:  Patient reports morning stiffness for *** {minute/hour:19697}.   Patient {ACTIONS;DENIES/REPORTS:21021675::"Denies"} nocturnal pain.  Difficulty dressing/grooming: {ACTIONS;DENIES/REPORTS:21021675::"Denies"} Difficulty climbing stairs: {ACTIONS;DENIES/REPORTS:21021675::"Denies"} Difficulty getting out of chair: {ACTIONS;DENIES/REPORTS:21021675::"Denies"} Difficulty using hands for taps, buttons, cutlery, and/or writing: {ACTIONS;DENIES/REPORTS:21021675::"Denies"}  No Rheumatology ROS completed.   PMFS History:  Patient Active Problem List   Diagnosis Date Noted  . Closed fracture of proximal end of left humerus with routine healing 05/19/2022  . DVT (deep venous thrombosis) (HCC) 04/21/2022  . Acute pulmonary embolism (HCC) 03/17/2022  . Type 2 diabetes mellitus (HCC) 03/17/2022  . Essential hypertension 03/17/2022  . Coronary artery calcification seen on CT scan 03/17/2022  . IPF (idiopathic pulmonary fibrosis) (HCC) 09/29/2021  . DOE (dyspnea on exertion) 09/29/2021  . ILD (interstitial lung disease) (HCC) 08/26/2019  . Fibromyalgia syndrome 06/26/2016  . Other fatigue 06/26/2016  . Other insomnia 06/26/2016  . Arthralgia of both knees 06/26/2016  . Pain in both feet 06/26/2016  . Primary osteoarthritis of both feet 06/26/2016  . Primary osteoarthritis of both knees 06/26/2016  . DDD (degenerative disc disease), cervical 06/26/2016  . 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 DDD (degenerative disc disease), lumbar 06/26/2016  . History of hypertension 06/26/2016  . History of high  cholesterol 06/26/2016  . Other sleep apnea 06/26/2016  . History of osteopenia 06/26/2016  . 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 THYROID  NODULE, LEFT 01/03/2008  . Hyperlipidemia 01/03/2008  . DEPRESSION 01/03/2008  . Migraine headache 01/03/2008  . Allergic rhinitis 01/03/2008  . Alopecia 01/03/2008  . 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 Broken humerus 02/2022   right  . Coronary artery calcification seen on CT scan   . Essential hypertension   . Fibromyalgia   . Idiopathic pulmonary fibrosis (HCC)   . Mixed hyperlipidemia   . Osteoporosis   . Type 2 diabetes mellitus (HCC)     Family History  Problem Relation Age of Onset  . Hypertension Mother   . Diabetes Mother   . Osteoporosis Mother   . Hypertension Father   . Heart disease Father   . COPD Father   . Colon cancer Sister   . Colon cancer Brother   . Heart disease Brother   . Cancer Brother   . Heart disease Brother   . Emphysema Paternal Grandfather    Past Surgical History:  Procedure Laterality Date  . ABDOMINAL HYSTERECTOMY  2007  . 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   Immunization History  Administered Date(s) Administered  . Fluad Quad(high Dose 65+) 01/29/2019, 12/16/2019, 01/24/2021  . Influenza Split 12/18/2017  . Influenza-Unspecified 11/18/2013  . PFIZER(Purple Top)SARS-COV-2 Vaccination 05/15/2019, 06/13/2019  . PNEUMOCOCCAL CONJUGATE-20 03/20/2022  . Tdap 05/04/1999     Objective: Vital Signs: There were no vitals taken for this visit.   Physical  Exam   Musculoskeletal Exam: ***  CDAI Exam: CDAI Score: -- Patient Global: --; Provider Global: -- Swollen: --; Tender: -- Joint Exam 08/15/2023   No joint exam has been documented for this visit   There is currently no information documented on the homunculus. Go to the Rheumatology  activity and complete the homunculus joint exam.  Investigation: No additional findings.  Imaging: No results found.  Recent Labs: Lab Results  Component Value Date   WBC 9.9 02/13/2023   HGB 13.2 02/13/2023   PLT 493 (H) 02/13/2023   NA 141 02/13/2023   K 4.6 02/13/2023   CL 104 02/13/2023   CO2 28 02/13/2023   GLUCOSE 117 (H) 02/13/2023   BUN 15 02/13/2023   CREATININE 0.79 02/13/2023   BILITOT 0.3 02/13/2023   ALKPHOS 79 10/03/2022   AST 20 02/13/2023   ALT 19 02/13/2023   PROT 7.0 02/13/2023   ALBUMIN 3.7 10/03/2022   CALCIUM 9.7 02/13/2023   GFRAA 101 03/15/2017    Speciality Comments: No specialty comments available.  Procedures:  No procedures performed Allergies: Latex, Pseudoephedrine, Sulfa antibiotics, and Sulfonamide derivatives   Assessment / Plan:     Visit Diagnoses: No diagnosis found.  Orders: No orders of the defined types were placed in this encounter.  No orders of the defined types were placed in this encounter.   Face-to-face time spent with patient was *** minutes. Greater than 50% of time was spent in counseling and coordination of care.  Follow-Up Instructions: No follow-ups on file.   Dee Farber, CMA  Note - This record has been created using Animal nutritionist.  Chart creation errors have been sought, but may not always  have been located. Such creation errors do not reflect on  the standard of medical care.

## 2023-08-05 ENCOUNTER — Other Ambulatory Visit: Payer: Self-pay | Admitting: Internal Medicine

## 2023-08-06 ENCOUNTER — Other Ambulatory Visit: Payer: Self-pay

## 2023-08-15 ENCOUNTER — Ambulatory Visit: Payer: Medicare Other | Admitting: Rheumatology

## 2023-08-15 DIAGNOSIS — Z8261 Family history of arthritis: Secondary | ICD-10-CM

## 2023-08-15 DIAGNOSIS — M8589 Other specified disorders of bone density and structure, multiple sites: Secondary | ICD-10-CM

## 2023-08-15 DIAGNOSIS — M797 Fibromyalgia: Secondary | ICD-10-CM

## 2023-08-15 DIAGNOSIS — M4004 Postural kyphosis, thoracic region: Secondary | ICD-10-CM

## 2023-08-15 DIAGNOSIS — M5030 Other cervical disc degeneration, unspecified cervical region: Secondary | ICD-10-CM

## 2023-08-15 DIAGNOSIS — M2550 Pain in unspecified joint: Secondary | ICD-10-CM

## 2023-08-15 DIAGNOSIS — M51369 Other intervertebral disc degeneration, lumbar region without mention of lumbar back pain or lower extremity pain: Secondary | ICD-10-CM

## 2023-08-15 DIAGNOSIS — G4709 Other insomnia: Secondary | ICD-10-CM

## 2023-08-15 DIAGNOSIS — Z8669 Personal history of other diseases of the nervous system and sense organs: Secondary | ICD-10-CM

## 2023-08-15 DIAGNOSIS — R5383 Other fatigue: Secondary | ICD-10-CM

## 2023-08-15 DIAGNOSIS — M19071 Primary osteoarthritis, right ankle and foot: Secondary | ICD-10-CM

## 2023-08-15 DIAGNOSIS — M170 Bilateral primary osteoarthritis of knee: Secondary | ICD-10-CM

## 2023-08-15 DIAGNOSIS — J849 Interstitial pulmonary disease, unspecified: Secondary | ICD-10-CM

## 2023-08-15 DIAGNOSIS — Z8639 Personal history of other endocrine, nutritional and metabolic disease: Secondary | ICD-10-CM

## 2023-08-15 DIAGNOSIS — M5134 Other intervertebral disc degeneration, thoracic region: Secondary | ICD-10-CM

## 2023-08-15 DIAGNOSIS — Z8659 Personal history of other mental and behavioral disorders: Secondary | ICD-10-CM

## 2023-08-15 DIAGNOSIS — Z5181 Encounter for therapeutic drug level monitoring: Secondary | ICD-10-CM

## 2023-08-15 DIAGNOSIS — Z8679 Personal history of other diseases of the circulatory system: Secondary | ICD-10-CM

## 2023-08-16 NOTE — Progress Notes (Deleted)
 Office Visit Note  Patient: Nichole Reyes             Date of Birth: December 27, 1950           MRN: [id number]             PCP: Fredick Jarred, PA-C Referring: Fredick Jarred, PA-C Visit Date: 08/30/2023 Occupation: @GUAROCC @  Subjective:  No chief complaint on file.   History of Present Illness: Nichole Reyes is a 73 y.o. female ***   -Anti-CCP negative, RF negative, 14 3 3  eta negative, and ESR WNL on 05/02/21   Family history of rheumatoid arthritis: Maternal grandmother.    ILD (interstitial lung disease) (HCC): Dyspnea started summer 2020.  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  Followed  by Dr. Bertrum Brodie. Reviewed Dr. Mardell Shade office visit note from 09/05/22-symptoms, exercise hypoxemia test, and radiology--stable.    She remains on Esbriet  as prescribed.  Plan to update CBC and CMP today. PFTs updated on 01/19/23. Upcoming appointment 02/20/23 to discuss results.  Lab work from 11/19/2018 was reviewed today in the office: ANA negative, RF negative, anti-CCP negative, SCL 70 negative, aldolase within normal limits, CRP within normal limits, Jo 1 antibody negative, ANCA negative, and dsDNA negative.   Activities of Daily Living:  Patient reports morning stiffness for *** {minute/hour:19697}.   Patient {ACTIONS;DENIES/REPORTS:21021675::"Denies"} nocturnal pain.  Difficulty dressing/grooming: {ACTIONS;DENIES/REPORTS:21021675::"Denies"} Difficulty climbing stairs: {ACTIONS;DENIES/REPORTS:21021675::"Denies"} Difficulty getting out of chair: {ACTIONS;DENIES/REPORTS:21021675::"Denies"} Difficulty using hands for taps, buttons, cutlery, and/or writing: {ACTIONS;DENIES/REPORTS:21021675::"Denies"}  No Rheumatology ROS completed.   PMFS History:  Patient Active Problem List   Diagnosis Date Noted   Closed fracture of proximal end of left humerus with routine healing 05/19/2022   DVT (deep venous thrombosis) (HCC) 04/21/2022   Acute pulmonary  embolism (HCC) 03/17/2022   Type 2 diabetes mellitus (HCC) 03/17/2022   Essential hypertension 03/17/2022   Coronary artery calcification seen on CT scan 03/17/2022   IPF (idiopathic pulmonary fibrosis) (HCC) 09/29/2021   DOE (dyspnea on exertion) 09/29/2021   ILD (interstitial lung disease) (HCC) 08/26/2019   Fibromyalgia syndrome 06/26/2016   Other fatigue 06/26/2016   Other insomnia 06/26/2016   Arthralgia of both knees 06/26/2016   Pain in both feet 06/26/2016   Primary osteoarthritis of both feet 06/26/2016   Primary osteoarthritis of both knees 06/26/2016   DDD (degenerative disc disease), cervical 06/26/2016   DDD (degenerative disc disease), thoracic 06/26/2016   DDD (degenerative disc disease), lumbar 06/26/2016   History of hypertension 06/26/2016   History of high cholesterol 06/26/2016   Other sleep apnea 06/26/2016   History of osteopenia 06/26/2016   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   THYROID  NODULE, LEFT 01/03/2008   Hyperlipidemia 01/03/2008   DEPRESSION 01/03/2008   Migraine headache 01/03/2008   Allergic rhinitis 01/03/2008   Alopecia 01/03/2008   Allergic arthritis 01/03/2008    Past Medical History:  Diagnosis Date   Broken humerus 02/2022   right   Coronary artery calcification seen on CT scan    Essential hypertension    Fibromyalgia    Idiopathic pulmonary fibrosis (HCC)    Mixed hyperlipidemia    Osteoporosis    Type 2 diabetes mellitus (HCC)     Family History  Problem Relation Age of Onset   Hypertension Mother    Diabetes Mother    Osteoporosis Mother    Hypertension Father    Heart disease Father    COPD Father    Colon cancer Sister  Colon cancer Brother    Heart disease Brother    Cancer Brother    Heart disease Brother    Emphysema Paternal Grandfather    Past Surgical History:  Procedure Laterality Date   ABDOMINAL HYSTERECTOMY  2007   CATARACT EXTRACTION Bilateral    Social History   Social History  Narrative   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   Immunization History  Administered Date(s) Administered   Fluad Quad(high Dose 65+) 01/29/2019, 12/16/2019, 01/24/2021   Influenza Split 12/18/2017   Influenza-Unspecified 11/18/2013   PFIZER(Purple Top)SARS-COV-2 Vaccination 05/15/2019, 06/13/2019   PNEUMOCOCCAL CONJUGATE-20 03/20/2022   Tdap 05/04/1999     Objective: Vital Signs: There were no vitals taken for this visit.   Physical Exam   Musculoskeletal Exam: ***  CDAI Exam: CDAI Score: -- Patient Global: --; Provider Global: -- Swollen: --; Tender: -- Joint Exam 08/30/2023   No joint exam has been documented for this visit   There is currently no information documented on the homunculus. Go to the Rheumatology activity and complete the homunculus joint exam.  Investigation: No additional findings.  Imaging: No results found.  Recent Labs: Lab Results  Component Value Date   WBC 9.9 02/13/2023   HGB 13.2 02/13/2023   PLT 493 (H) 02/13/2023   NA 141 02/13/2023   K 4.6 02/13/2023   CL 104 02/13/2023   CO2 28 02/13/2023   GLUCOSE 117 (H) 02/13/2023   BUN 15 02/13/2023   CREATININE 0.79 02/13/2023   BILITOT 0.3 02/13/2023   ALKPHOS 79 10/03/2022   AST 20 02/13/2023   ALT 19 02/13/2023   PROT 7.0 02/13/2023   ALBUMIN 3.7 10/03/2022   CALCIUM 9.7 02/13/2023   GFRAA 101 03/15/2017    Speciality Comments: No specialty comments available.  Procedures:  No procedures performed Allergies: Latex, Pseudoephedrine, Sulfa antibiotics, and Sulfonamide derivatives   Assessment / Plan:     Visit Diagnoses: Primary osteoarthritis of both knees  Primary osteoarthritis of both feet  DDD (degenerative disc disease), cervical  Postural kyphosis of thoracic region  DDD (degenerative disc disease), thoracic  Degeneration of intervertebral disc of lumbar region without discogenic back pain or lower extremity pain  ILD (interstitial lung disease)  (HCC)  Fibromyalgia  Other fatigue  Other insomnia  Osteopenia of multiple sites  Family history of rheumatoid arthritis  History of hypertension  History of hypercholesterolemia  History of migraine  History of sleep apnea  History of depression  Orders: No orders of the defined types were placed in this encounter.  No orders of the defined types were placed in this encounter.   Face-to-face time spent with patient was *** minutes. Greater than 50% of time was spent in counseling and coordination of care.  Follow-Up Instructions: No follow-ups on file.   Romayne Clubs, PA-C  Note - This record has been created using Dragon software.  Chart creation errors have been sought, but may not always  have been located. Such creation errors do not reflect on  the standard of medical care.

## 2023-08-22 DIAGNOSIS — R42 Dizziness and giddiness: Secondary | ICD-10-CM | POA: Diagnosis not present

## 2023-08-22 DIAGNOSIS — R739 Hyperglycemia, unspecified: Secondary | ICD-10-CM | POA: Diagnosis not present

## 2023-08-22 DIAGNOSIS — E7849 Other hyperlipidemia: Secondary | ICD-10-CM | POA: Diagnosis not present

## 2023-08-22 DIAGNOSIS — Z0001 Encounter for general adult medical examination with abnormal findings: Secondary | ICD-10-CM | POA: Diagnosis not present

## 2023-08-22 DIAGNOSIS — E559 Vitamin D deficiency, unspecified: Secondary | ICD-10-CM | POA: Diagnosis not present

## 2023-08-22 DIAGNOSIS — E039 Hypothyroidism, unspecified: Secondary | ICD-10-CM | POA: Diagnosis not present

## 2023-08-27 DIAGNOSIS — Z1389 Encounter for screening for other disorder: Secondary | ICD-10-CM | POA: Diagnosis not present

## 2023-08-27 DIAGNOSIS — Z0000 Encounter for general adult medical examination without abnormal findings: Secondary | ICD-10-CM | POA: Diagnosis not present

## 2023-08-27 DIAGNOSIS — E7849 Other hyperlipidemia: Secondary | ICD-10-CM | POA: Diagnosis not present

## 2023-08-27 DIAGNOSIS — R739 Hyperglycemia, unspecified: Secondary | ICD-10-CM | POA: Diagnosis not present

## 2023-08-27 DIAGNOSIS — E782 Mixed hyperlipidemia: Secondary | ICD-10-CM | POA: Diagnosis not present

## 2023-08-27 DIAGNOSIS — I10 Essential (primary) hypertension: Secondary | ICD-10-CM | POA: Diagnosis not present

## 2023-08-27 DIAGNOSIS — Z0001 Encounter for general adult medical examination with abnormal findings: Secondary | ICD-10-CM | POA: Diagnosis not present

## 2023-08-27 DIAGNOSIS — M797 Fibromyalgia: Secondary | ICD-10-CM | POA: Diagnosis not present

## 2023-08-28 NOTE — Progress Notes (Deleted)
 Office Visit Note  Patient: Nichole Reyes             Date of Birth: Jan 09, 1951           MRN: [id number]             PCP: Fredick Jarred, PA-C Referring: Fredick Jarred, PA-C Visit Date: 08/29/2023 Occupation: @GUAROCC @  Subjective:  No chief complaint on file.   History of Present Illness: Nichole Reyes is a 73 y.o. female ***   Patient was evaluated by Dr. Marcheta Seta on February 20, 2023.  According to his note the PFTs in November 2024 showed slowly progressive disease.  He recommended antireflux medications and to continue pirfenidone .  Activities of Daily Living:  Patient reports morning stiffness for *** {minute/hour:19697}.   Patient {ACTIONS;DENIES/REPORTS:21021675::"Denies"} nocturnal pain.  Difficulty dressing/grooming: {ACTIONS;DENIES/REPORTS:21021675::"Denies"} Difficulty climbing stairs: {ACTIONS;DENIES/REPORTS:21021675::"Denies"} Difficulty getting out of chair: {ACTIONS;DENIES/REPORTS:21021675::"Denies"} Difficulty using hands for taps, buttons, cutlery, and/or writing: {ACTIONS;DENIES/REPORTS:21021675::"Denies"}  No Rheumatology ROS completed.   PMFS History:  Patient Active Problem List   Diagnosis Date Noted   Closed fracture of proximal end of left humerus with routine healing 05/19/2022   DVT (deep venous thrombosis) (HCC) 04/21/2022   Acute pulmonary embolism (HCC) 03/17/2022   Type 2 diabetes mellitus (HCC) 03/17/2022   Essential hypertension 03/17/2022   Coronary artery calcification seen on CT scan 03/17/2022   IPF (idiopathic pulmonary fibrosis) (HCC) 09/29/2021   DOE (dyspnea on exertion) 09/29/2021   ILD (interstitial lung disease) (HCC) 08/26/2019   Fibromyalgia syndrome 06/26/2016   Other fatigue 06/26/2016   Other insomnia 06/26/2016   Arthralgia of both knees 06/26/2016   Pain in both feet 06/26/2016   Primary osteoarthritis of both feet 06/26/2016   Primary osteoarthritis of both knees 06/26/2016   DDD (degenerative disc disease),  cervical 06/26/2016   DDD (degenerative disc disease), thoracic 06/26/2016   DDD (degenerative disc disease), lumbar 06/26/2016   History of hypertension 06/26/2016   History of high cholesterol 06/26/2016   Other sleep apnea 06/26/2016   History of osteopenia 06/26/2016   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   THYROID  NODULE, LEFT 01/03/2008   Hyperlipidemia 01/03/2008   DEPRESSION 01/03/2008   Migraine headache 01/03/2008   Allergic rhinitis 01/03/2008   Alopecia 01/03/2008   Allergic arthritis 01/03/2008    Past Medical History:  Diagnosis Date   Broken humerus 02/2022   right   Coronary artery calcification seen on CT scan    Essential hypertension    Fibromyalgia    Idiopathic pulmonary fibrosis (HCC)    Mixed hyperlipidemia    Osteoporosis    Type 2 diabetes mellitus (HCC)     Family History  Problem Relation Age of Onset   Hypertension Mother    Diabetes Mother    Osteoporosis Mother    Hypertension Father    Heart disease Father    COPD Father    Colon cancer Sister    Colon cancer Brother    Heart disease Brother    Cancer Brother    Heart disease Brother    Emphysema Paternal Grandfather    Past Surgical History:  Procedure Laterality Date   ABDOMINAL HYSTERECTOMY  2007   CATARACT EXTRACTION Bilateral    Social History   Social History Narrative   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   Immunization History  Administered Date(s) Administered   Fluad Quad(high Dose 65+) 01/29/2019, 12/16/2019, 01/24/2021   Influenza Split 12/18/2017   Influenza-Unspecified 11/18/2013  PFIZER(Purple Top)SARS-COV-2 Vaccination 05/15/2019, 06/13/2019   PNEUMOCOCCAL CONJUGATE-20 03/20/2022   Tdap 05/04/1999     Objective: Vital Signs: There were no vitals taken for this visit.   Physical Exam   Musculoskeletal Exam: ***  CDAI Exam: CDAI Score: -- Patient Global: --; Provider Global: -- Swollen: --; Tender: -- Joint Exam  08/29/2023   No joint exam has been documented for this visit   There is currently no information documented on the homunculus. Go to the Rheumatology activity and complete the homunculus joint exam.  Investigation: No additional findings.  Imaging: No results found.  Recent Labs: Lab Results  Component Value Date   WBC 9.9 02/13/2023   HGB 13.2 02/13/2023   PLT 493 (H) 02/13/2023   NA 141 02/13/2023   K 4.6 02/13/2023   CL 104 02/13/2023   CO2 28 02/13/2023   GLUCOSE 117 (H) 02/13/2023   BUN 15 02/13/2023   CREATININE 0.79 02/13/2023   BILITOT 0.3 02/13/2023   ALKPHOS 79 10/03/2022   AST 20 02/13/2023   ALT 19 02/13/2023   PROT 7.0 02/13/2023   ALBUMIN 3.7 10/03/2022   CALCIUM 9.7 02/13/2023   GFRAA 101 03/15/2017    Speciality Comments: No specialty comments available.  Procedures:  No procedures performed Allergies: Latex, Pseudoephedrine, Sulfa antibiotics, and Sulfonamide derivatives   Assessment / Plan:     Visit Diagnoses: No diagnosis found.  Orders: No orders of the defined types were placed in this encounter.  No orders of the defined types were placed in this encounter.   Face-to-face time spent with patient was *** minutes. Greater than 50% of time was spent in counseling and coordination of care.  Follow-Up Instructions: No follow-ups on file.   Nicholas Bari, MD  Note - This record has been created using Animal nutritionist.  Chart creation errors have been sought, but may not always  have been located. Such creation errors do not reflect on  the standard of medical care.

## 2023-08-29 ENCOUNTER — Ambulatory Visit: Admitting: Rheumatology

## 2023-08-29 DIAGNOSIS — Z8261 Family history of arthritis: Secondary | ICD-10-CM

## 2023-08-29 DIAGNOSIS — M5030 Other cervical disc degeneration, unspecified cervical region: Secondary | ICD-10-CM

## 2023-08-29 DIAGNOSIS — M19071 Primary osteoarthritis, right ankle and foot: Secondary | ICD-10-CM

## 2023-08-29 DIAGNOSIS — Z8639 Personal history of other endocrine, nutritional and metabolic disease: Secondary | ICD-10-CM

## 2023-08-29 DIAGNOSIS — M797 Fibromyalgia: Secondary | ICD-10-CM

## 2023-08-29 DIAGNOSIS — Z8659 Personal history of other mental and behavioral disorders: Secondary | ICD-10-CM

## 2023-08-29 DIAGNOSIS — J849 Interstitial pulmonary disease, unspecified: Secondary | ICD-10-CM

## 2023-08-29 DIAGNOSIS — Z86718 Personal history of other venous thrombosis and embolism: Secondary | ICD-10-CM

## 2023-08-29 DIAGNOSIS — R5383 Other fatigue: Secondary | ICD-10-CM

## 2023-08-29 DIAGNOSIS — M51369 Other intervertebral disc degeneration, lumbar region without mention of lumbar back pain or lower extremity pain: Secondary | ICD-10-CM

## 2023-08-29 DIAGNOSIS — M170 Bilateral primary osteoarthritis of knee: Secondary | ICD-10-CM

## 2023-08-29 DIAGNOSIS — Z8679 Personal history of other diseases of the circulatory system: Secondary | ICD-10-CM

## 2023-08-29 DIAGNOSIS — M8589 Other specified disorders of bone density and structure, multiple sites: Secondary | ICD-10-CM

## 2023-08-29 DIAGNOSIS — M4004 Postural kyphosis, thoracic region: Secondary | ICD-10-CM

## 2023-08-29 DIAGNOSIS — Z8669 Personal history of other diseases of the nervous system and sense organs: Secondary | ICD-10-CM

## 2023-08-29 DIAGNOSIS — G4709 Other insomnia: Secondary | ICD-10-CM

## 2023-08-29 DIAGNOSIS — M5134 Other intervertebral disc degeneration, thoracic region: Secondary | ICD-10-CM

## 2023-08-30 ENCOUNTER — Other Ambulatory Visit: Payer: Self-pay

## 2023-08-30 ENCOUNTER — Ambulatory Visit: Admitting: Physician Assistant

## 2023-08-30 DIAGNOSIS — G4709 Other insomnia: Secondary | ICD-10-CM

## 2023-08-30 DIAGNOSIS — M51369 Other intervertebral disc degeneration, lumbar region without mention of lumbar back pain or lower extremity pain: Secondary | ICD-10-CM

## 2023-08-30 DIAGNOSIS — M5030 Other cervical disc degeneration, unspecified cervical region: Secondary | ICD-10-CM

## 2023-08-30 DIAGNOSIS — R5383 Other fatigue: Secondary | ICD-10-CM

## 2023-08-30 DIAGNOSIS — Z8659 Personal history of other mental and behavioral disorders: Secondary | ICD-10-CM

## 2023-08-30 DIAGNOSIS — M170 Bilateral primary osteoarthritis of knee: Secondary | ICD-10-CM

## 2023-08-30 DIAGNOSIS — Z8261 Family history of arthritis: Secondary | ICD-10-CM

## 2023-08-30 DIAGNOSIS — Z8639 Personal history of other endocrine, nutritional and metabolic disease: Secondary | ICD-10-CM

## 2023-08-30 DIAGNOSIS — M8589 Other specified disorders of bone density and structure, multiple sites: Secondary | ICD-10-CM

## 2023-08-30 DIAGNOSIS — M5134 Other intervertebral disc degeneration, thoracic region: Secondary | ICD-10-CM

## 2023-08-30 DIAGNOSIS — Z8669 Personal history of other diseases of the nervous system and sense organs: Secondary | ICD-10-CM

## 2023-08-30 DIAGNOSIS — M19071 Primary osteoarthritis, right ankle and foot: Secondary | ICD-10-CM

## 2023-08-30 DIAGNOSIS — Z8679 Personal history of other diseases of the circulatory system: Secondary | ICD-10-CM

## 2023-08-30 DIAGNOSIS — M797 Fibromyalgia: Secondary | ICD-10-CM

## 2023-08-30 DIAGNOSIS — M4004 Postural kyphosis, thoracic region: Secondary | ICD-10-CM

## 2023-08-30 DIAGNOSIS — J849 Interstitial pulmonary disease, unspecified: Secondary | ICD-10-CM

## 2023-08-30 NOTE — Progress Notes (Signed)
 Specialty Pharmacy Refill Coordination Note  Nichole Reyes is a 73 y.o. female contacted today regarding refills of specialty medication(s) Pirfenidone    Patient requested Delivery   Delivery date: 09/13/23   Verified address: [address] [place] [address]   Medication will be filled on 06.25.25.

## 2023-08-31 ENCOUNTER — Other Ambulatory Visit: Payer: Self-pay

## 2023-08-31 ENCOUNTER — Other Ambulatory Visit: Payer: Self-pay | Admitting: Rheumatology

## 2023-08-31 NOTE — Telephone Encounter (Signed)
 Last Fill: 07/25/2023  Next Visit: 09/18/2023  Last Visit: 02/13/2023  Dx: Fibromyalgia    Current Dose per office note on 02/13/2023: not discussed   Okay to refill Tizanidine ?

## 2023-09-05 ENCOUNTER — Other Ambulatory Visit: Payer: Self-pay | Admitting: Internal Medicine

## 2023-09-05 DIAGNOSIS — J84112 Idiopathic pulmonary fibrosis: Secondary | ICD-10-CM

## 2023-09-06 ENCOUNTER — Ambulatory Visit: Admitting: Internal Medicine

## 2023-09-06 ENCOUNTER — Encounter: Payer: Self-pay | Admitting: Internal Medicine

## 2023-09-06 VITALS — BP 108/68 | HR 74 | Ht 65.0 in | Wt 165.0 lb

## 2023-09-06 DIAGNOSIS — J84112 Idiopathic pulmonary fibrosis: Secondary | ICD-10-CM

## 2023-09-06 DIAGNOSIS — Z5181 Encounter for therapeutic drug level monitoring: Secondary | ICD-10-CM

## 2023-09-06 LAB — PULMONARY FUNCTION TEST
DL/VA % pred: 110 %
DL/VA: 4.55 ml/min/mmHg/L
DLCO cor % pred: 63 %
DLCO cor: 12.77 ml/min/mmHg
DLCO unc % pred: 63 %
DLCO unc: 12.77 ml/min/mmHg
FEF 25-75 Pre: 2.99 L/s
FEF2575-%Pred-Pre: 152 %
FEV1-%Pred-Pre: 73 %
FEV1-Pre: 1.74 L
FEV1FVC-%Pred-Pre: 117 %
FEV6-%Pred-Pre: 65 %
FEV6-Pre: 1.96 L
FEV6FVC-%Pred-Pre: 104 %
FVC-%Pred-Pre: 62 %
FVC-Pre: 1.96 L
Pre FEV1/FVC ratio: 89 %
Pre FEV6/FVC Ratio: 100 %

## 2023-09-06 LAB — HEPATIC FUNCTION PANEL
ALT: 21 U/L (ref 0–35)
AST: 23 U/L (ref 0–37)
Albumin: 4.2 g/dL (ref 3.5–5.2)
Alkaline Phosphatase: 69 U/L (ref 39–117)
Bilirubin, Direct: 0 mg/dL (ref 0.0–0.3)
Total Bilirubin: 0.2 mg/dL (ref 0.2–1.2)
Total Protein: 7.4 g/dL (ref 6.0–8.3)

## 2023-09-06 NOTE — Progress Notes (Signed)
 OV 11/19/2018: Nichole Reyes is a 73 year old woman referred for evaluation of dyspnea for the past several months.  In the same time interval she is also noticed fatigue.  She has previously undergone a negative cardiac evaluation and an outside CT scan of her chest.  Per report, the CT demonstrates fibrotic changes. She is a never- smoker.  She has previously been evaluated by rheumatology, Dr. Alvira Josephs, for fibromyalgia and osteoarthritis.  She began to notice dyspnea on exertion when exercising in February 2020.  She had previously been able to walk 35 to 40 minutes, but decreased to about 20 minutes at a time due to dyspnea.  She was hospitalized in [place] in June 2020 for dyspnea. At that time she was COVID negative.  She was discharged on albuterol , which she has been using up to 4 times a day, which improves her symptoms.  There is no family history of fibrotic lung disease; multiple family members had COPD.  Her maternal grandmother had rheumatoid arthritis, but otherwise there is no family history of rheumatologic disease.  In addition to dyspnea on exertion with occasional dyspnea at rest she endorses cough and sputum production.  She denies wheezing, postnasal drip, coughing or choking when eating, Raynaud's, muscle weakness, rashes, dry eyes or mouth, joint swelling, prolonged morning stiffness.  She has occasional hand stiffness throughout the day, not associated with rest.  She has a hiatal hernia with occasional GERD with symptoms less than once a week, associated with certain foods.  She is a never smoker or vaper.  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  She has never worked in [employer] or shipyards.  She does not have any dusty hobbies.  She has a Development worker, international aid, but has never had pet birds.  She does not regularly use hot tubs.  She has never had exposure to medical radiation, methotrexate,  Macrobid, amiodarone, bleomycin.  Her only new medication was Zetia  last month for hypertriglyceridemia.  Otherwise she has not had medication changes in greater than a year.  Last month she has had 6 episodes of epistaxis, which has improved with use of intranasal steroids prescribed by her PCP.     OV 05/06/2019  Subjective:  Patient ID: Nichole Reyes, female , DOB: [birth year] , age 70 y.o. , MRN: [id number] , ADDRESS: [address] [place] [address]   05/06/2019 -   Chief Complaint  Patient presents with   Follow-up    Pt states she has both good days and bad days with breathing. Pt states she will occ have to use her rescue inhaler at least 1-2 times with activities.     HPI Nichole Reyes 73 y.o. -referred by Dr. 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  She was first seen by Dr. Fulton Job in September 2020 noted above.  She followed up in December 2020 and had pulmonary function test.  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  Due to insurance delays she has not started the nintedanib yet.  She is wondering about this.  She has specific questions about taking Covid vaccine.  She is also worried about her significant cough.  Review shows she is on ACE inhibitor.  She has a tickle in the throat.  She has acid reflux and is on Prilosec.  She has a history  of hiatal hernia.  She is taken a decision on starting nintedanib but she is interested in hearing about the other antifibrotic pirfenidone  because of insurance delay she has not started on anything as yet.   [facility] Comprehensive ILD Questionnaire  Symptoms:  -Insidious onset of shortness of breath 7 months ago.  Since it started it has been the same.  She also has episodic amount of dyspnea.  The severity classification is below.  She does not know when her cough started but she does have a cough associated with clearing of the throat.  It is  moderate in intensity.  She does cough at night.  She does bring up phlegm.  The phlegm is usually white but sometimes yellow.  She does feel a tickle in her throat but does not wake up in the middle of the night because of the cough.  There is no wheezing.  Cough is associated with ACE inhibitor intake.    Past Medical History : Positive for fibromyalgia, hiatal hernia, sleep apnea both for the last few years.  There is also borderline diabetes..  And hyperlipidemia   she denies any asthma or COPD or heart failure rheumatoid arthritis or scleroderma or lupus.  Denies any polymyositis.  Denies Sjogren's.  Denies HIV.  Denies pulmonary hypertension.  Denies thyroid  disease or stroke.  Denies seizures or mononucleosis or hepatitis or tuberculosis.  Denies kidney disease.  Denies pneumonia.  Denies history of blood clots or heart disease or pleurisy.  Autoimmune serology sept 2020  - normal Results for Nichole Reyes, Nichole Reyes (MRN [id number]) as of 05/06/2019 12:01  Ref. Range 11/19/2018 14:58  SEE BELOW Unknown Comment  Anti Nuclear Antibody (ANA) Latest Ref Range: Negative  Negative  Anti JO-1 Latest Ref Range: 0.0 - 0.9 AI <0.2  CENTROMERE AB SCREEN Latest Ref Range: 0.0 - 0.9 AI <0.2  dsDNA Ab Latest Ref Range: 0 - 9 IU/mL <1  ENA RNP Ab Latest Ref Range: 0.0 - 0.9 AI <0.2  ENA SSA (RO) Ab Latest Ref Range: 0.0 - 0.9 AI <0.2  ENA SSB (LA) Ab Latest Ref Range: 0.0 - 0.9 AI <0.2  ENA SM Ab Ser-aCnc Latest Ref Range: 0.0 - 0.9 AI <0.2  Chromatin Ab SerPl-aCnc Latest Ref Range: 0.0 - 0.9 AI <0.2  Anti-Jo-1 Ab (RDL) Latest Ref Range: <20 Units <20  Anti-PL-7 Ab (RDL) Unknown CANCELED  Anti-PL-12 Ab (RDL) Unknown CANCELED  Anti-EJ Ab (RDL) Unknown CANCELED  Anti-OJ Ab (RDL) Unknown CANCELED  Anti-SRP Ab (RDL) Unknown CANCELED  Anti-Mi-2 Ab (RDL) Unknown CANCELED  Anti-TIF-1gamma Ab (RDL) Latest Units: Units CANCELED  Anti-MDA-5 Ab (CADM-140)(RDL) Latest Units: Units CANCELED  Anti-NXP-2 (P140) Ab  (RDL) Latest Units: Units CANCELED  Anti-SAE1 Ab, IgG (RDL) Latest Units: Units CANCELED  Anti-PM/Scl-100 Ab (RDL) Latest Ref Range: <20 Units <20  Anti-Ku Ab (RDL) Unknown CANCELED  Anti-SS-A 52kD Ab, IgG (RDL) Latest Ref Range: <20 Units <20  Anti-U1 RNP Ab (RDL) Latest Units: Units CANCELED    ROS: She has fatigue for the last several years and also arthralgia.  She attributes this to fibromyalgia.  She also has pain in her fingers which she attributes due to fibromyalgia.  She has heartburn from hiatal hernia she says.  She also snores because of her sleep apnea and she has a rash related to eczema.  But otherwise negative.   FAMILY HISTORY of LUNG DISEASE: She thinks her dad might have had COPD and her grandfather.  However nobody with pulmonary fibrosis  or other lung diseases.   EXPOSURE HISTORY: Denies ever having smoked cigarettes although she grew up in a home with her dad smoked.  Therefore positive for passive smoking as a child.  Denies cigar use.  Denies marijuana use denies cocaine use denies IV drug use.  Denies vaping.   HOME and HOBBY DETAILS : [place]  She is lived in this home for 22 years.  The home is 71 years old.  She has noticed mildew periodically in the shower curtain and in the bathroom.  She does use a CPAP mask but there is no water circuit mold or mildew in it.  She does do gardening with the flower garden she does use a steam iron but there is no mold or mildew in it.  There is no pet birds.  No feather pillow or duvet.  No pet gerbils.  No mold in the [facility] duct.  Does not play any wind instruments.  Does not use a humidifier.  Does not have a Jacuzzi in the house.   OCCUPATIONAL HISTORY (122 questions) : Essentially negative.  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   PULMONARY TOXICITY HISTORY (27 items): Negative except prednisone use    Results for Nichole Reyes, Nichole Reyes (MRN [id number]) as of 05/06/2019 12:01  Ref. Range 11/19/2018 but ? Done 02/28/2019 14:57  FVC-Pre Latest Units: L 2.29  FVC-%Pred-Pre Latest Units: % 70   Results for Nichole Reyes, Nichole Reyes (MRN [id number]) as of 05/06/2019 12:01  Ref. Range 11/19/2018 but ? Done 02/28/2019 14:57  DLCO unc Latest Units: ml/min/mmHg 15.75  DLCO unc % pred Latest Units: % 77    She had a high-resolution CT chest September 24, 2018 at [facility].  This was read by our radiologist Dr Hubbard Mad..  I do not have the image to visualized but I trust this report.  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  In addition is described a 1.4 cm right upper lobe posterior segment inferior part nodule that is subpleural and also 8 mm subpleural right middle lobe nodule.  He says these findings are unchanged compared to 2008 and are benign.  OV 06/03/2019 -telephone visit.  Patient identified with 2 person identifier.  Risks, benefits and limitations of telephone visit explained.  Subjective:  Patient ID: Nichole Reyes, female , DOB: 04-06-1950 , age 63 y.o. , MRN: [id number] , ADDRESS: [address] [place] [address]   Clinical IPF diagnosis given dec 2020 and reiterated Mid Feb 2021:  based on your age greater than 65, clubbing on exam, CT scan with high probability for this condition, negative history on your questionnaire and negative blood test on your serology exams   06/03/2019 -  Fu IPF - focus on esbriet  monitoring and any other questions she might have   HPI Nichole Reyes 72 y.o. - on Esbriet  now. Started it 05/22/2019 Thursday. Currently on 2 pills tid. Taking it with food. Spacing 5-6h between dosing. No side effects at this point.  From a dyspnea standpoint she is stable.  She had questions about being able to do gardening.  We discussed the fact this could cause mold  exposure and organic dust exposure that could irritate her IPF.  She says she can do it with  masking.  Gardening gives her purpose so I supported it.  She also plan [place] last week and was quite short of breath and took an inhaler.  I have suggested that she use a pulse ox meter and monitor her pulse ox and call us  if her pulse ox below 88% so we can order portable oxygen  [obviously will have to do stress testing submaximal 6-minute walk test] otherwise doing well.    OV 12/16/2019   Subjective:  Patient ID: Nichole Reyes, female , DOB: 09-Jul-1950, age 3 y.o. years. , MRN: [id number],  ADDRESS: [address] [place] [address] PCP  Belvie Boyers, MD Providers : Treatment Team:  Attending Provider: Maire Scot, MD   Chief Complaint  Patient presents with   Follow-up    Tired all the time. redness/itching to bilateral nick and insides of  arms.  stress/heat?     Clinical IPF diagnosis given dec 2020 and reiterated Mid Feb 2021:  based on your age greater than 65, clubbing on exam, CT scan with high probability for this condition, negative history on your questionnaire and negative blood test on your serology exams . - on Esbriet  now. Started it 05/22/2019   GERD/Hiatal Hernia  HPI Nichole Reyes 73 y.o. -returns for follow-up.  Last seen in early part of 2021.  After that she see nurse practitioner 2 times once or at least telephone visit.  She tried it in pulmonary rehabilitation for IPF but she had logistical issues.  Early morning she has to get her husband ready for work.  Late in the afternoon she has to pick up her granddaughter.  She also has knee issues from a fibromyalgia.  Therefore is not able to do pulmonary rehabilitation.  She is very compliant with her pirfenidone .  She feels she is tolerating it fine but she did admit to low appetite and also has a 7 pound weight loss.  She is also fatigued.  She thinks the fatigue is present for the last few months.  She  thinks it might be related to her daughter's wedding but then the fatigue has been there for the last few months while the daughter's wedding happened a few weeks ago.  She is not on oxygen .  Walking desaturation test is stable.  Most recent pulmonary function test is stable.  Symptom score other than fatigue is also stable.  She continues PPI for her acid reflux.      OV 05/06/2020  Subjective:  Patient ID: Nichole Reyes, female , DOB: 11/03/1950 , age 63 y.o. , MRN: [id number] , ADDRESS: [address] [place] [address] PCP Belvie Boyers, MD Patient Care Team: Belvie Boyers, MD as PCP - General (Family Medicine) Flavia Hughs, MD (Inactive) as PCP - Cardiology (Cardiology)  This Provider for this visit: Treatment Team:  Attending Provider: Maire Scot, MD    05/06/2020 -   Chief Complaint  Patient presents with   Follow-up    SOB with exertion    Clinical IPF diagnosis given dec 2020 and reiterated Mid Feb 2021:  based on your age greater than 65, clubbing on exam, CT scan with high probability for this condition, negative history on your questionnaire and negative blood test on your serology exams . - on Esbriet  now. Started it 05/22/2019   GERD/Hiatal Hernia  Last CT scan of the chest July 2020 at [facility] with probable UIP Last PFT June 2021  HPI Nichole Reyes 73 y.o. -presents for follow-up.  Last  seen in September 2021.  Since then she continues to deal with her right knee issue.  She is seen Dr. Nicholas Bari for this.  Apparently a knee MRI is pending.  She is continuing to lose weight.  She has lost 10 pounds in the last 1 year.  This correlates with starting pirfenidone .  Technically she is still overweight.  She is also reporting associated fatigue and metallic taste.  A few months ago she made a call and we told her to hold the pirfenidone .  After that the metallic taste largely resolved but it does come back although to a much lesser severity.   At this point in time it does not bother her too much so she think she will continue to take pirfenidone .  From a respiratory standpoint she feels stable although she would say that in the last 1 year she feels the disease might be slowly progressive.  Her symptom score shows slight worsening especially with shortness of breath and weight loss but same fatigue.  Some nausea with pirfenidone .  He was supposed to pulmonary function testing today but for some reason has not happened.  Last PFT was June 2021.  Last CT scan of the chest was July 2020 I do not have echocardiogram in our system.  She did have a low risk nuclear medicine stress test with 59% in June 2020   OV 07/13/2020  Subjective:  Patient ID: Nichole Reyes, female , DOB: [birth year] , age 76 y.o. , MRN: [id number] , ADDRESS: [address] [place] [address] PCP Fredick Jarred, PA-C Patient Care Team: Fredick Jarred, PA-C as PCP - General (Family Medicine) Flavia Hughs, MD (Inactive) as PCP - Cardiology (Cardiology)  This Provider for this visit: Treatment Team:  Attending Provider: Maire Scot, MD    07/13/2020 -   Chief Complaint  Patient presents with   Follow-up    No concerns, here to get PFT results.    Clinical IPF diagnosis given dec 2020 and reiterated Mid Feb 2021:  based on your age greater than 65, clubbing on exam, CT scan with high probability for this condition, negative history on your questionnaire and negative blood test on serology exams . - Oon Esbriet  since 05/22/2019   GERD/Hiatal Hernia  Last PFT April 2022 Last CT scan of the chest March 2022   HPI Nichole Reyes 73 y.o. -returns for follow-up to discuss her test results.  She definitely has more shortness of breath now than she did in September 2021.  She continues to lose weight.  But her BMI is still 27.  No metallic taste no nausea vomiting or diarrhea.  No chest pains.  Her pulmonary function test shows slight decline in DLCO but  FVC is stable.  Walking desaturation to stable.  High-resolution CT chest shows stability in ILD compared to 2020.  Therefore overall she is probably stable.  There is evidence of coronary artery calcification and also aortic valve calcifications on the CT.  Her echo showed mild diastolic dysfunction that could be contributing to the dyspnea.  There is also mild aortic valve sclerosis.  There is no chest pain with exertion.  Her last cardiologist in [place] is no longer in the health system.  She is not seen a cardiologist in 7 years.  Does not recollect when her last stress test was.   WE discussed   1. Scientific Purpose  Clinical research is designed to produce generalizable knowledge and to answer questions about the safety  and efficacy of intervention(s) under study in order to determine whether or not they may be useful for the care of future patients.  2. Study Procedures  Participation in a trial may involve procedures or tests, in addition to the intervention(s) under study, that are intended only or primarily to generate scientific knowledge and that are otherwise not necessary for patient care.   3. Uncertainty  For intervention(s) under study in clinical research, there often is less knowledge and more uncertainty about the risks and benefits to a population of trial participants than there is when a doctor offers a patient standard interventions.   4. Adherence to Protocol  Administration of the intervention(s) under study is typically based on a strict protocol with defined dose, scheduling, and use or avoidance of concurrent medications, compared to administration of standard interventions.  5. Clinician as Investigator  Clinicians who are in health care settings provide treatment; in a clinical trial setting, they are also investigating safety and efficacy of an intervention. In otherwise your doctor or nurse practitioner can be wearing 2 hats - one as care giver another as  Oceanographer  6. Patient as Engineer, agricultural Subject  Patients participating in research trials are research subjects or volunteers. In other words participating in research is 100% voluntary and at one's own free weill. The decision to participate or not participate will NOT affect patient care and the doctor-patient relationship in any way   ECHO 06/29/20  IMPRESSIONS     1. Left ventricular ejection fraction, by estimation, is 60 to 65%. The  left ventricle has normal function. The left ventricle has no regional  wall motion abnormalities. There is mild left ventricular hypertrophy.  Left ventricular diastolic parameters  are consistent with Grade I diastolic dysfunction (impaired relaxation).   2. Right ventricular systolic function is normal. The right ventricular  size is normal. There is normal pulmonary artery systolic pressure. The  estimated right ventricular systolic pressure is 25.8 mmHg.   3. The mitral valve is grossly normal. Trivial mitral valve  regurgitation.   4. The aortic valve is tricuspid. Aortic valve regurgitation is not  visualized. Mild aortic valve sclerosis is present, with no evidence of  aortic valve stenosis.   5. The inferior vena cava is normal in size with greater than 50%  respiratory variability, suggesting right atrial pressure of 3 mmHg.   Comparison(s): No prior Echocardiogram.    CT Chest data march 72536  Narrative & Impression  CLINICAL DATA:  73 year old female with history of chronic dyspnea. Fibrotic changes noted on prior CT examination. Follow-up study.   EXAM: CT CHEST WITHOUT CONTRAST   TECHNIQUE: Multidetector CT imaging of the chest was performed following the standard protocol without intravenous contrast. High resolution imaging of the lungs, as well as inspiratory and expiratory imaging, was performed.   COMPARISON:  No priors available for comparison.   FINDINGS: Cardiovascular: Heart size is normal.  There is no significant pericardial fluid, thickening or pericardial calcification. There is aortic atherosclerosis, as well as atherosclerosis of the great vessels of the mediastinum and the coronary arteries, including calcified atherosclerotic plaque in the left main, left anterior descending, left circumflex and right coronary arteries. Calcifications of the aortic valve.   Mediastinum/Nodes: No pathologically enlarged mediastinal or hilar lymph nodes. Please note that accurate exclusion of hilar adenopathy is limited on noncontrast CT scans. Esophagus is unremarkable in appearance. No axillary lymphadenopathy.   Lungs/Pleura: High-resolution images demonstrate widespread areas of peripheral predominant ground-glass attenuation,  septal thickening, mild cylindrical bronchiectasis and peripheral bronchiolectasis. Findings have a mild craniocaudal gradient. No definitive honeycombing is identified. Inspiratory and expiratory imaging is unremarkable. No acute consolidative airspace disease. No pleural effusions. In the periphery of the right middle lobe (axial image 89 of series 6) there is a 7 x 5 mm (mean diameter of 6 mm) subpleural pulmonary nodule (stable compared to prior study 09/24/2018). In the superior segment of the right lower lobe (axial image 73 of series 6) there is a 2.3 x 0.8 cm elongated nodular area of architectural distortion which is intimately associated with an accessory fissure, which appears slightly larger on axial images, but is completely stable in size and appearance when viewed on sagittal reconstructions.   Upper Abdomen: Aortic atherosclerosis.   Musculoskeletal: There are no aggressive appearing lytic or blastic lesions noted in the visualized portions of the skeleton.   IMPRESSION: 1. 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 Repeat high-resolution chest CT is recommended in 12 months to assess for temporal changes in the appearance of the lung parenchyma. 2. Pulmonary nodules in the right lung are stable compared to the prior study from 2020, likely benign. Attention at time of repeat high-resolution chest CT is recommended to ensure continued stability. 3. Aortic atherosclerosis, in addition to left main and 3 vessel coronary artery disease. Please note that although the presence of coronary artery calcium documents the presence of coronary artery disease, the severity of this disease and any potential stenosis cannot be assessed on this non-gated CT examination. Assessment for potential risk factor modification, dietary therapy or pharmacologic therapy may be warranted, if clinically indicated. 4. There are calcifications of the aortic valve. Echocardiographic correlation for evaluation of potential valvular dysfunction may be warranted if clinically indicated.   Aortic Atherosclerosis (ICD10-I70.0).   Electronically Signed: By: Alexandria Angel M.D. On: 06/01/2020 09:51  ADDENDUM REPORT: 06/21/2020 14:33   ADDENDUM: It was brought to my attention that there is a prior chest CT from 09/24/2018. After comparison between the 05/31/2020 exam and the 09/24/2018 exam, there is no evidence of progression. Assessment remains unchanged. Specifically, based on the spectrum of findings this continues to be categorized as probable usual interstitial pneumonia (UIP) per current ATS guidelines.     Electronically Signed   By: Alexandria Angel M.D.   On: 06/21/2020 14:33        OV 01/24/2021  Subjective:  Patient ID: Nichole Reyes, female , DOB: January 30, 1951 , age 28 y.o. , MRN: [id number] , ADDRESS: [address] [place] [address] PCP Fredick Jarred, PA-C Patient Care Team: Fredick Jarred, PA-C as PCP - General (Family Medicine) Gerard Knight, MD as PCP - Cardiology  (Cardiology)  This Provider for this visit: Treatment Team:  Attending Provider: Maire Scot, MD    01/24/2021 -   Chief Complaint  Patient presents with   Follow-up    F/U after PFT. States her breathing has been stable since last visit with SOB with exertion.        HPI Nichole Reyes 73 y.o. -returns for follow-up.  In this visit she has had pulmonary function test.  It shows a 7% decline in FVC and DLCO in 2 years.  Is roughly 3.5 %/year.  She is actually having more shortness of breath.  She did see cardiology and had a normal Myoview  stress test.  She has  been reassured from a cardiology standpoint.  But has dyspnea score is worse.  She is unable to do pulmonary rehabilitation because of issues with family and also because she has osteoarthritis of her knees.  She is seeing rheumatology for this.  She says she has never seen orthopedic for this.  Did indicate to her to talk to rheumatology and see if at this time to do a knee replacement and if she needs an orthopedic referral.  Did indicate that at some point the ILD could get worse and she would be quite dyspneic and unable to mobilize.  But at this point from a safety standpoint would be a good point in time to do a knee replacement if the knees are really bad.  She understood this.  We did discuss worsening.  She is on maximal standard of care.  We did discuss clinical trials as a care option she is interested.  She has been losing weight with pirfenidone  there is more weight loss.  Her BMI is 27 and technically she is still overweight.  We decided to continue with pirfenidone .  She will have a liver function test today.  She will have a high-dose flu shot today.     OV 08/18/2021  Subjective:  Patient ID: Nichole Reyes, female , DOB: [birth year] , age 68 y.o. , MRN: [id number] , ADDRESS: [address] [place] [address] PCP Fredick Jarred, PA-C Patient Care Team: Fredick Jarred, PA-C as PCP - General (Family  Medicine) Gerard Knight, MD as PCP - Cardiology (Cardiology)  This Provider for this visit: Treatment Team:  Attending Provider: Maire Scot, MD    08/18/2021 -   Chief Complaint  Patient presents with   Follow-up    PFT performed today.  Pt states she has been doing okay since last visit. States she gets nauseous every time she takes her Esbriet  and also will become SOB with exertion.    Hyperlipidemia  HPI Nichole Reyes 73 y.o. -returns for follow-up.  At this visit she continues to be stable in terms of previous weight loss but also dyspnea on exertion although this time she tells me that the dyspnea is bothering her though the symptom score is stable.  Her pulmonary function test shows overall slow decline over a long period of time but most recently the 2 studies are stable.  She appears to get dyspneic while walking the dog.  She is looking for measures to help her.  Did indicate to her about deconditioning.  She has previously been to pulmonary rehabilitation.  Her schedule a does not allow her to go to rehab.  We talked about doing a 6-minute walk test to look for exertional desaturation and seeing if she would qualify for oxygen .  She is willing to do this.    The more important thing is that she is having significant nausea.  In fact the nausea is worse now.  All of a sudden for the last few months.  She started taking over-the-counter Emetrol a few days ago and this seems to help.  She told me that she is spacing her pirfenidone  only every 4 hours.  This is what she thought she needed to do.  I did indicate to her that she needs to space it out at least 5 to 6 hours apart.  We will see if this helps the nausea.  She does drink enough fluids.  We went over her medication list.  This been significant changes to her  anticholesterol medications.  Since earlier this year she has been on Repatha.  Then a few days ago or a few weeks ago her Zetia  got changed back to fenofibrate .   The timing of this medication does not fit in with nausea.  Nevertheless she says she has hyperlipidemia for over 30 years.  Managed with primary care.  She says it is very difficult to control.  Never been seen by lipid specialist.  Willing to see Dr. Kenneth Italy Hilty.   Of note she has been interested in clinical trials but given the current nausea she wants to hold off at this point  CT Chest data   OV 02/28/2022  Subjective:  Patient ID: Nichole Reyes, female , DOB: Dec 02, 1950 , age 30 y.o. , MRN: [id number] , ADDRESS: [address] [place] [address] PCP Fredick Jarred, PA-C Patient Care Team: Fredick Jarred, PA-C as PCP - General (Family Medicine) Gerard Knight, MD as PCP - Cardiology (Cardiology)  This Provider for this visit: Treatment Team:  Attending Provider: Maire Scot, MD    02/28/2022 -   Chief Complaint  Patient presents with   Follow-up    PFT performed today. Pt states she has been doing okay since last visit.     HPI Nichole Reyes 73 y.o. -returns for follow-up.  Since last seeing me in summer 2023 she is stable.  She tells me that Thanksgiving 2023 she did have outpatient COVID-19.  Treated with Paxlovid.  4 days into this she was extremely tired and then suddenly passed out.  Since then she has been feeling well.  She felt it was because of dehydration and polypharmacy.  However from a respiratory standpoint and pirfenidone  tolerance standpoint she is doing really well.  No changes in symptoms.  Pulmonary function test is actually stable since the last 1 year [results below].  She had a normal echocardiogram in the summer 2023.  Results reviewed.  Her acid reflux under control.  In the past we have discussed clinical trials as a care option.  Today discussed the potential for osteoporosis risk with pulmonary fibrosis patients.  This study going on by [employer].  It involves getting a DEXA bone scan and she is willing to participate if she  qualifies.         OV 06/05/2022  Subjective:  Patient ID: Nichole Reyes, female , DOB: 08/22/1950 , age 49 y.o. , MRN: [id number] , ADDRESS: [address] [place] [address] PCP Fredick Jarred, PA-C Patient Care Team: Fredick Jarred, PA-C as PCP - General (Family Medicine) Gerard Knight, MD as PCP - Cardiology (Cardiology) Paulett Boros, MD as Medical Oncologist (Hematology)  This Provider for this visit: Treatment Team:  Attending Provider: Maire Scot, MD  06/05/2022 -   Chief Complaint  Patient presents with   Follow-up    Pt is here for follow up for IPF. Pt states she is doing well and still taking Esbriet . No side effects noted so far while on medication      HPI Nichole Reyes 73 y.o. -returns for follow-up.  Presents with her husband.  I am meeting the husband for the first time.  He did get dependent history forming the fact that she does get short of breath when she does things around the house and does some minimal yard work.  He does think it slightly progressive over the last 6-12 months.  Patient herself states that after the COVID-19 and Thanksgiving she injured her left shoulder  and then in the aftermath of that after Christmas 2023 she did develop a right-sided DVT and a pulmonary embolism with RV/LV ratio of 1.47 although BNP was normal at that time.  She was discharged slightly after New Year's 2024.  2D echocardiogram done towards the end of January 2024 does not show any right heart strain and it is baseline with grade 1 diastolic dysfunction.  She believes she is slightly more dyspneic than in the past after the pulmonary embolism.  However her shortness of breath scores remained stable and walking desaturation test is stable.  The pulmonary function test though shows a decline in DLCO subsequently [see below].  Her walking desaturation test is stable today.  Lab review also shows a slight amount of anemia in the hospital.  She had  high-resolution CT chest and according to the radiologist pulm fibrosis stable x 2 years.  She is tolerating the pirfenidone  well.  However she is continue to lose weight.  She is still overweight technically.    HRCT 05/24/22  Narrative & Impression  CLINICAL DATA:  73 year old female with history of interstitial lung disease.   EXAM: CT CHEST WITHOUT CONTRAST   TECHNIQUE: Multidetector CT imaging of the chest was performed following the standard protocol without intravenous contrast. High resolution imaging of the lungs, as well as inspiratory and expiratory imaging, was performed.   RADIATION DOSE REDUCTION: This exam was performed according to the departmental dose-optimization program which includes automated exposure control, adjustment of the mA and/or kV according to patient size and/or use of iterative reconstruction technique.   COMPARISON:  High-resolution chest CT 05/31/2020. Chest CTA 03/17/2022.   FINDINGS: Cardiovascular: Heart size is mildly enlarged. There is no significant pericardial fluid, thickening or pericardial calcification. There is aortic atherosclerosis, as well as atherosclerosis of the great vessels of the mediastinum and the coronary arteries, including calcified atherosclerotic plaque in the left main, left anterior descending, left circumflex and right coronary arteries. Calcifications of the aortic valve.   Mediastinum/Nodes: No pathologically enlarged mediastinal or hilar lymph nodes. Please note that accurate exclusion of hilar adenopathy is limited on noncontrast CT scans. Esophagus is unremarkable in appearance. No axillary lymphadenopathy.   Lungs/Pleura: High-resolution images again demonstrate widespread areas of peripheral predominant ground-glass attenuation, septal thickening, subpleural reticulation, thickening of the peribronchovascular interstitium, cylindrical bronchiectasis and peripheral bronchiolectasis scattered  throughout the lungs bilaterally. No definitive honeycombing confidently identified. No significant progression of disease compared to the prior study. Findings have a mild craniocaudal gradient. Inspiratory and expiratory imaging is unremarkable. No acute consolidative airspace disease. No pleural effusions. A few scattered tiny pulmonary nodules are noted, stable compared to the prior high-resolution chest CT 05/31/2020, largest of which measures only 3 mm in the medial right upper lobe (axial image 31 of series 5), considered definitively benign requiring no imaging follow-up. Small calcified granuloma also noted in the medial right upper lobe. No other larger more suspicious appearing pulmonary nodules or masses are noted.   Upper Abdomen: Aortic atherosclerosis.   Musculoskeletal: There are no aggressive appearing lytic or blastic lesions noted in the visualized portions of the skeleton.   IMPRESSION: 1. The appearance of the lungs remains compatible with interstitial lung disease, once again categorized as probable usual interstitial pneumonia (UIP) per current ATS guidelines. Given the lack of progression compared to prior examinations, the possibility of an alternative etiology such as fibrotic phase nonspecific interstitial pneumonia (NSIP) should be considered. 2. Mild cardiomegaly. 3. Aortic atherosclerosis, in addition to left main and  three-vessel coronary artery disease. Please note that although the presence of coronary artery calcium documents the presence of coronary artery disease, the severity of this disease and any potential stenosis cannot be assessed on this non-gated CT examination. Assessment for potential risk factor modification, dietary therapy or pharmacologic therapy may be warranted, if clinically indicated. 4. There are calcifications of the aortic valve. Echocardiographic correlation for evaluation of potential valvular dysfunction may be warranted  if clinically indicated.   Aortic Atherosclerosis (ICD10-I70.0).     Electronically Signed   By: Alexandria Angel M.D.   On: 05/24/2022 13:25     OV 09/05/2022  Subjective:  Patient ID: Nichole Reyes, female , DOB: September 26, 1950 , age 44 y.o. , MRN: [id number] , ADDRESS: [address] [place] [address] PCP Fredick Jarred, PA-C Patient Care Team: Fredick Jarred, PA-C as PCP - General (Family Medicine) Gerard Knight, MD as PCP - Cardiology (Cardiology) Paulett Boros, MD as Medical Oncologist (Hematology)  This Provider for this visit: Treatment Team:  Attending Provider: Maire Scot, MD     09/05/2022 -   Chief Complaint  Patient presents with   Follow-up    Doing well.     HPI Nichole Reyes 73 y.o. -presents with her husband Nichole Reyes Bland for follow-up.  Nichole Reyes Bland is now retired.  He was the Agricultural consultant for a shoe company called [employer].  He is now accompanying her with visits.  She reports overall being stable.  Dyspnea score is stable.  She is actually gained some weight because of increased sugar intake.  She continues with the pirfenidone .  It does give her some fatigue and nausea but she says it is well-controlled.  Was supposed to get pulmonary function test this week but unfortunately was done shortly after the last visit.  Disease appears stable between March and April 2024.  Currently the symptom score is stable.  The excess hypoxemia test results are stable.  She did get dyspneic doing exercise test.  We talked about pulm rehabilitation.  She did not attended previously because of scheduling convenience.  Decided to do another referral and referred to the rehab center at [facility].  She had a pulm embolism and right-sided DVT in December 2023: Will check a D-dimer now to decide on length of follow-up.  She is completed 6 months of treatment.  Suspect she will need to do 1 full year at full dose  Discussed clinical trials as a care option again.  She is  interested.  Her husband Nichole Reyes Bland had some questions.  Gave her consent form for study involving BEXOTE[employer]RA[employer] v Placebo.  We gave her the date on this.  The studies called beacon.  At some point we will prescreener.  OV 02/20/2023  Subjective:  Patient ID: Nichole Reyes, female , DOB: 02/09/1951 , age 14 y.o. , MRN: [id number] , ADDRESS: [address] [place] [address] PCP Fredick Jarred, PA-C Patient Care Team: Fredick Jarred, PA-C as PCP - General (Family Medicine) Gerard Knight, MD as PCP - Cardiology (Cardiology) Paulett Boros, MD as Medical Oncologist (Hematology) Maire Scot, MD as Consulting Physician (Pulmonary Disease)  This Provider for this visit: Treatment Team:  Attending Provider: Maire Scot, MD    02/20/2023 -   Chief Complaint  Patient presents with   Follow-up      HPI Estrella Hench Gammel 73 y.o. -returns for routine follow-up.  Since I last saw her in June 2024 she followed up with PA and hematology in July  2024.  A CT angiogram chest was done and there was no pulmonary embolism.  A D-dimer at that time was normal based on lab review.  External record review indicates that because it was a provoked PE which is from COVID and immobility and these test being normal and coagulopathy testing being negative they stopped Eliquis .  I did discuss with the patient about getting a recheck D-dimer today and she is agreeable.  In terms of pirfenidone : She is tolerating this overall well except for the fact she has nausea intermittently she rated it is level 4 but she tells me its only intermittent and occasional and she is able to manage without any antinausea medications.  She also has altered taste and she is not able to taste the burger as well.  Despite this she does not want to change the pirfenidone  to nintedanib.  She had a liver function test just before Thanksgiving 2024 it is normal  In terms of pulmonary fibrosis and symptoms: She is more dyspneic  doing activities.  This is reflected in the symptom score [see below].  We did an exercise sit/stand hypoxemia test and the some exaggerated tendency to desaturate compared to the past.  I did indicate to her that very likely she is declining lung function and pulmonary fibrosis.  She is not interested in switching to nintedanib.  We discussed about adding clinical trial but she was interested at the last visit but she tells me she has daughter's wedding to plan for in the summer 2025 and she is just worried about living in [place] and commuting here for all the requirements of the test.  Did indicate that her time in travel are covered.  If needed taxi services can be provided.  However she feels that she wants to reflect on all this for the next couple of months and then decide.  Did indicate to her that at this point in time there is concern for lung function worsening.    Other issues - She did see Jacinta Martinis 02/13/2019 for rheumatology.  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  Autoimmune profile was done this is normal. -She saw her cardiologist Dr. Virgel Griffes 10/31/2022 note reviewed. -CT angiogram chest reported in July 2024 as disease without progression but given a contrast CT scan this is inaccurate.  I personally visualized that CT chest    OV 09/06/2023  Subjective:  Patient ID: Nichole Reyes, female , DOB: 08-15-1950 , age 8 y.o. , MRN: [id number] , ADDRESS: [address] [place] [address] PCP Fredick Jarred, PA-C Patient Care Team: Fredick Jarred, PA-C as PCP - General (Family Medicine) Gerard Knight, MD as PCP - Cardiology (Cardiology) Paulett Boros, MD as Medical Oncologist (Hematology) Maire Scot, MD as Consulting Physician (Pulmonary Disease)  This Provider for this visit: Treatment Team:  Attending Provider: Maire Scot, MD    09/06/2023 -   Chief Complaint  Patient presents with   Follow-up    PFT repeated today. Breathing is unchanged since the last visit.     Clinical IPF diagnosis given dec 2020 and reiterated Mid Feb 2021:  based on your age greater than 65, clubbing on exam, CT scan with high probability for this condition, negative history on your questionnaire and negative blood test on serology exams .  - Oon Esbriet  since 05/22/2019 - Last PFT  April  2024 -> Nov 2024 - Last CT scan of the chest March 2024 -. CTA July 2024 without change   GERD/Hiatal Hernia  DJD/OA knee =-   Drug induced weihgt loss -due to pirfenidone   Grade 1 diastolic dysfunction with normal pulmonary artery pressure  - last echo Jan 2024   History of COVID-19 Thanksgiving 2023 treated with outpatient Paxlovid  -Fatigue related syncope along with polypharmacy   PE 03/17/22 - 03/20/22 admission with Rt DVT - Rx Eliquis  RV/LV ratio 1.47, BMP{ 60  - ECHO Apr 13, 2022 - No RV strain and normal PAP. Gr 1 ddx  - Didimer normal FEb 2024. July 2024  - CTA July 2024 - No PE  -Stopped Eliquis  by Sheril Dines PA and hematology July 2024.  Lung nodules as of July 2024  = odular architectural distortion in the superior segment of the right lower lobe along the right major fissure measuring 1.5 x 1.0 cm is not significantly changed since 2022.   - . Unchanged 9 x 6 mm subpleural pulmonary nodule in the right middle lobe (series 6, image 79), benig - No further follow-up recommended.   Esbriet /Pirfenidone  requires intensive drug monitoring due to high concerns for Adverse effects of , including  Drug Induced Liver Injury, significant GI side effects that include but not limited to Diarrhea, Nausea, Vomiting,  and other system side effects that include Fatigue, headaches, weight loss and other side effects such as skin rash. These will be monitored with  blood work such as LFT initially once a month for 6 months and then quarterly   HPI SHAQUILLA KEHRES 73 y.o. -turns for follow-up.  She tolerates pirfenidone /Esbriet  quite well.  Respiratory status is  stable.  Exercise hypoxemia test is stable.  She did pulmonary function test.  I think overall this is stable since March 2024 but slightly decline compared to September 2023.  She did do a D-dimer recently and this is normal.  BNP within the last 1 year is normal. Interim Health status: No new complaints No new medical problems. No new surgeries. No ER visits. No Urgent care visits. No changes to medications   Social: Her youngest daughter got married at the [place] house in [place] and she was very excited this was last week.     SYMPTOM SCALE - ILD 05/06/2019 179# 12/16/2019 172# - esbriet  + 05/06/2020 169# - esbriet  07/13/2020 167# - on esbreiet 01/24/2021 164# 08/18/2021 166# - esbriet  02/28/2022 162# 06/05/2022 160# 09/05/2022 167# 02/20/2023 170# 09/06/2023 165# - esbirelt  O2 use ra            Shortness of Breath 0 -> 5 scale with 5 being worst (score 6 If unable to do)            At rest 4 1 0 2 3 1 1 3 3 3  3  Simple tasks - showers, clothes change, eating, shaving 4 1 2 2 4 3 4 5 4 5 4   Household (dishes, doing bed, laundry) 5 5 4 4 5 5 5 4 5 5 4   Shopping 5 3 4 3 4 5 5 5 4 5 5   Walking level at own pace 5 3 4 3 4 5 5 3 4 5 5   Walking up Stairs 5 5 5 4 5 5 5 5 5 5 5   Total (30-36) Dyspnea Score 28 15 19 18 25 24 25 25 25 28 26   How bad is your cough? 2.5 1 0 0 0 2 3 2 3 2  0  How bad is your fatigue  yes 4 3 4 3 4 4 3 3 3 3   How bad  is nausea x 0 2 0 2 4 1 1 3 4 0   How bad is vomiting?  x 0 0 0 0 0 0 0 0 0 0   How bad is diarrhea? x 0 0 0 0 0 0 0 0 0 0   How bad is anxiety? x 00 1 2 3 2 4 2 3 4 3   How bad is depression x 0 3 2 2 3 4 1 3 4 3       Simple office walk 185 feet x  3 laps goal with forehead probe 05/06/2019  12/16/2019  05/06/2020  07/13/2020  01/24/2021  08/18/2021  06/05/2022  09/05/2022  02/20/2023   O2 used ra ra ra ra ra ra  ra ra  Number laps completed 3 3 3  goal - stopped at 2 due to dyspnea 3 and did all 3 3 3   Sit ad stand x 10 Sist stand x 10  Comments  about pace avg mod Slow pace avg       Resting Pulse Ox/HR 98% and 90/min 97% and 77/min 98% and 90/min 96% and 94 98% and 90 100% and 84 97% and HR 82 97% and HR 81 95% and HR 74  Final Pulse Ox/HR 96% and 100/min 97% and 91/mn 94% and 92 at 2nd lap 93% and 103/min 96% and 99 95% and 96 95% and HR 94 95% and HR 83 90% and HR 88  Desaturated </= 88% no no no no no      Desaturated <= 3% points no no Yes,, 4 pint Yes 3 points no      Got Tachycardic >/= 90/min yes yes yes yes       Symptoms at end of test Mod dyspnea, knee pain Dyspnea and fatigue strting at 1st lap and increased by 3rd stoppe early and did drop 3 points Dyspnea at end  Dyspnea mild to mod, light headed No dyspnea Level 5 dyspnea Level 5 dyspnea and could not do more  Miscellaneous comments x                SIT STAND TEST - goal 15 times   09/06/2023    O2 used ra   PRobe - finter or forehead forehead   Number sit and stand completed - goal 15 15   Time taken to complete 45 sec   Resting Pulse Ox/HR/Dyspnea  98% and 72/min and dyspnea of 3/10    Peak measures 98 % and 90/min and dyspnea of 4.5/10   Final Pulse Ox/HR 95% and 80/min and dyspnea of 4/10   Desaturated </= 88% no   Desaturated <= 3% points yes   Got Tachycardic >/= 90/min yes   Miscellaneous comments Moderte dyspnea      PFT     Latest Ref Rng & Units 09/06/2023    1:32 PM 01/19/2023   12:48 PM 07/18/2022    3:04 PM 06/05/2022   11:46 AM 11/29/2021   11:32 AM 08/18/2021    9:16 AM 01/24/2021   10:55 AM  PFT Results  FVC-Pre L 1.96  P 2.03  2.10  2.09  2.10  2.14  2.12   FVC-Predicted Pre % 62  P 64  66  66  65  66  66   Pre FEV1/FVC % % 89  P 87  90  78  91  90  89   FEV1-Pre L 1.74  P 1.76  1.90  1.64  1.91  1.91  1.88   FEV1-Predicted Pre %  73  P 73  79  68  78  78  77   DLCO uncorrected ml/min/mmHg 12.77  P 10.69  11.75  11.10  14.80  15.98  14.63   DLCO UNC% % 63  P 52  58  54  72  78  72   DLCO corrected ml/min/mmHg 12.77  P 10.69  11.75   11.14  14.80  15.98  14.63   DLCO COR %Predicted % 63  P 52  58  55  72  78  72   DLVA Predicted % 110  P 93  96  94  113  109  114     P Preliminary result       LAB RESULTS last 96 hours No results found.       has a past medical history of Broken humerus (02/2022), Coronary artery calcification seen on CT scan, Essential hypertension, Fibromyalgia, Idiopathic pulmonary fibrosis (HCC), Mixed hyperlipidemia, Osteoporosis, and Type 2 diabetes mellitus (HCC).   reports that she has never smoked. She has been exposed to tobacco smoke. She has never used smokeless tobacco.  Past Surgical History:  Procedure Laterality Date   ABDOMINAL HYSTERECTOMY  2007   CATARACT EXTRACTION Bilateral     Allergies  Allergen Reactions   Latex    Pseudoephedrine Other (See Comments)    Rash and hallucinations    Sulfa Antibiotics    Sulfonamide Derivatives Rash    Immunization History  Administered Date(s) Administered   Fluad Quad(high Dose 65+) 01/29/2019, 12/16/2019, 01/24/2021   Influenza Split 12/18/2017   Influenza-Unspecified 11/18/2013   PFIZER(Purple Top)SARS-COV-2 Vaccination 05/15/2019, 06/13/2019   PNEUMOCOCCAL CONJUGATE-20 03/20/2022, 02/28/2023   Tdap 05/04/1999   Zoster Recombinant(Shingrix) 03/15/2023, 05/16/2023    Family History  Problem Relation Age of Onset   Hypertension Mother    Diabetes Mother    Osteoporosis Mother    Hypertension Father    Heart disease Father    COPD Father    Colon cancer Sister    Colon cancer Brother    Heart disease Brother    Cancer Brother    Heart disease Brother    Emphysema Paternal Grandfather      Current Outpatient Medications:    ACCU-CHEK AVIVA PLUS test strip, , Disp: , Rfl:    Accu-Chek Softclix Lancets lancets, , Disp: , Rfl:    albuterol  (VENTOLIN  HFA) 108 (90 Base) MCG/ACT inhaler, Inhale 2 puffs into the lungs every 4 (four) hours as needed for wheezing or shortness of breath., Disp: , Rfl:    ARNUITY  ELLIPTA 100 MCG/ACT AEPB, INHALE 1 PUFF INTO THE LUNGS EVERY DAY, Disp: 30 each, Rfl: 6   aspirin EC 81 MG tablet, Take 81 mg by mouth daily. Swallow whole., Disp: , Rfl:    Azelastine HCl 137 MCG/SPRAY SOLN, , Disp: , Rfl:    Calcium Carbonate (CALTRATE 600 PO), Take 1 tablet by mouth 2 (two) times a day., Disp: , Rfl:    clonazePAM  (KLONOPIN ) 0.5 MG tablet, Take 0.5 mg by mouth at bedtime., Disp: , Rfl:    cyanocobalamin  (VITAMIN B12) 1000 MCG tablet, Take 1,000 mcg by mouth daily., Disp: , Rfl:    fenofibrate  (TRICOR ) 48 MG tablet, Take 48 mg by mouth at bedtime., Disp: , Rfl:    fexofenadine (ALLEGRA) 180 MG tablet, Take 180 mg by mouth daily., Disp: , Rfl:    losartan  (COZAAR ) 50 MG tablet, Take 50 mg by mouth daily., Disp: , Rfl:    metFORMIN (GLUCOPHAGE-XR)  500 MG 24 hr tablet, Take 500 mg by mouth daily with breakfast. , Disp: , Rfl:    metoprolol  succinate (TOPROL -XL) 100 MG 24 hr tablet, Take 100 mg by mouth daily. Take with or immediately following a meal., Disp: , Rfl:    omeprazole  (PRILOSEC) 20 MG capsule, Take 1 capsule (20 mg total) by mouth daily., Disp: 30 capsule, Rfl: 11   Pirfenidone  (ESBRIET ) 801 MG TABS, Take 1 tablet (801 mg total) by mouth 3 (three) times daily with meals., Disp: 270 tablet, Rfl: 1   REPATHA SURECLICK 140 MG/ML SOAJ, Inject 1 mL into the skin every 14 (fourteen) days., Disp: , Rfl:    SAVELLA  50 MG TABS tablet, Take 50 mg by mouth 2 (two) times daily. , Disp: , Rfl:    spironolactone  (ALDACTONE ) 25 MG tablet, take (1/2) tablet by mouth daily., Disp: 45 tablet, Rfl: 2   tiZANidine  (ZANAFLEX ) 2 MG tablet, take (2) tablets every night at bedtime., Disp: 60 tablet, Rfl: 2   levocetirizine (XYZAL) 5 MG tablet, Take 5 mg by mouth daily., Disp: , Rfl:       Objective:   Vitals:   09/06/23 1427 09/06/23 1428  BP: 108/68   Pulse:  74  SpO2:  98%  Weight: 165 lb (74.8 kg)   Height: 5' 5 (1.651 m)     Estimated body mass index is 27.46 kg/m as  calculated from the following:   Height as of this encounter: 5' 5 (1.651 m).   Weight as of this encounter: 165 lb (74.8 kg).  @WEIGHTCHANGE @  [employer]   09/06/23 1427  Weight: 165 lb (74.8 kg)     Physical Exam   General: No distress. Looks well O2 at rest: no Cane present: no Sitting in wheel chair: no Frail: no Obese: no Neuro: Alert and Oriented x 3. GCS 15. Speech normal Psych: Pleasant Resp:  Barrel Chest - no.  Wheeze - no, Crackles - yes bas, No overt respiratory distress CVS: Normal heart sounds. Murmurs - no Ext: Stigmata of Connective Tissue Disease - no HEENT: Normal upper airway. PEERL +. No post nasal drip        Assessment:       ICD-10-CM   1. IPF (idiopathic pulmonary fibrosis) (HCC)  J84.112 Hepatic function panel    Pulmonary function test    2. Medication monitoring encounter  Z51.81 Hepatic function panel    Pulmonary function test         Plan:     Patient Instructions  IPF (idiopathic pulmonary fibrosis) (HCC) Medication monitoring encounter Gastroesophageal reflux disease, unspecified whether esophagitis present Dyspnea on exertion    - PFT worse since sept 2023 but stable since March 2024 -> June 2025 - Symptoms and exercise test are stable   Plan -Continue acid reflux medications -Continue pirfenidone  per schedule   -At this point in time benefit outweighs risk  -Continue space 5-6h between meals and apply sunscreen when going out - Check LFT 09/06/2023 -  Do spiro/dlco in 6 months     Pulmonary Embolism with Rt lower extremity DVT Dec 2023  - ? Due to covid as risk factor and shoulder injury - CTA negative July 2024 and Sept 2024 [facility] Confer stopped Eliquis  - March 2025 - d-dimer NORMAL  Plan - clinically monitor   Followup -Return to see  DR Bertrum Brodie in 6 months  - 15 min visit but after spiro/dlco  - exercise hypoxemia test at followup   FOLLOWUP Return in about  6 months (around 03/07/2024) for 15  min visit, after Spiro and DLCO, with Dr Bertrum Brodie, Face to Face Visit.    SIGNATURE    Dr. Maire Scot, M.D., F.C.C.P,  Pulmonary and Critical Care Medicine Staff Physician, [facility] Director - Interstitial Lung Disease  Program  Pulmonary Fibrosis [facility], [place], [address]  Pager: [phone], If no answer or between  15:00h - 7:00h: call [phone] Telephone: [phone]  3:03 PM 09/06/2023

## 2023-09-06 NOTE — Patient Instructions (Signed)
 Spirometry/DLCO performed today.

## 2023-09-06 NOTE — Patient Instructions (Addendum)
 IPF (idiopathic pulmonary fibrosis) (HCC) Medication monitoring encounter Gastroesophageal reflux disease, unspecified whether esophagitis present Dyspnea on exertion    - PFT worse since sept 2023 but stable since March 2024 -> June 2025 - Symptoms and exercise test are stable   Plan -Continue acid reflux medications -Continue pirfenidone  per schedule   -At this point in time benefit outweighs risk  -Continue space 5-6h between meals and apply sunscreen when going out - Check LFT 09/06/2023 -  Do spiro/dlco in 6 months     Pulmonary Embolism with Rt lower extremity DVT Dec 2023  - ? Due to covid as risk factor and shoulder injury - CTA negative July 2024 and Sept 2024 [facility] Confer stopped Eliquis  - March 2025 - d-dimer NORMAL  Plan - clinically monitor   Followup -Return to see  DR Bertrum Brodie in 6 months  - 15 min visit but after spiro/dlco  - exercise hypoxemia test at followup

## 2023-09-06 NOTE — Progress Notes (Signed)
 Spirometry/DLCO performed today.

## 2023-09-11 ENCOUNTER — Ambulatory Visit: Payer: Self-pay | Admitting: Internal Medicine

## 2023-09-11 NOTE — Progress Notes (Signed)
 Normal LFT

## 2023-09-12 ENCOUNTER — Other Ambulatory Visit: Payer: Self-pay

## 2023-09-18 ENCOUNTER — Encounter: Payer: Self-pay | Admitting: Physician Assistant

## 2023-09-18 ENCOUNTER — Ambulatory Visit: Attending: Physician Assistant | Admitting: Physician Assistant

## 2023-09-18 VITALS — BP 114/70 | HR 84 | Resp 16 | Ht 65.0 in | Wt 165.8 lb

## 2023-09-18 DIAGNOSIS — M810 Age-related osteoporosis without current pathological fracture: Secondary | ICD-10-CM | POA: Diagnosis not present

## 2023-09-18 DIAGNOSIS — Z8659 Personal history of other mental and behavioral disorders: Secondary | ICD-10-CM

## 2023-09-18 DIAGNOSIS — Z8679 Personal history of other diseases of the circulatory system: Secondary | ICD-10-CM

## 2023-09-18 DIAGNOSIS — Z8261 Family history of arthritis: Secondary | ICD-10-CM

## 2023-09-18 DIAGNOSIS — M797 Fibromyalgia: Secondary | ICD-10-CM

## 2023-09-18 DIAGNOSIS — M19071 Primary osteoarthritis, right ankle and foot: Secondary | ICD-10-CM

## 2023-09-18 DIAGNOSIS — M5134 Other intervertebral disc degeneration, thoracic region: Secondary | ICD-10-CM | POA: Diagnosis not present

## 2023-09-18 DIAGNOSIS — M51369 Other intervertebral disc degeneration, lumbar region without mention of lumbar back pain or lower extremity pain: Secondary | ICD-10-CM | POA: Diagnosis not present

## 2023-09-18 DIAGNOSIS — M7701 Medial epicondylitis, right elbow: Secondary | ICD-10-CM

## 2023-09-18 DIAGNOSIS — M170 Bilateral primary osteoarthritis of knee: Secondary | ICD-10-CM | POA: Diagnosis not present

## 2023-09-18 DIAGNOSIS — R5383 Other fatigue: Secondary | ICD-10-CM

## 2023-09-18 DIAGNOSIS — Z8669 Personal history of other diseases of the nervous system and sense organs: Secondary | ICD-10-CM

## 2023-09-18 DIAGNOSIS — M19072 Primary osteoarthritis, left ankle and foot: Secondary | ICD-10-CM

## 2023-09-18 DIAGNOSIS — M4004 Postural kyphosis, thoracic region: Secondary | ICD-10-CM | POA: Diagnosis not present

## 2023-09-18 DIAGNOSIS — Z8639 Personal history of other endocrine, nutritional and metabolic disease: Secondary | ICD-10-CM

## 2023-09-18 DIAGNOSIS — M5030 Other cervical disc degeneration, unspecified cervical region: Secondary | ICD-10-CM

## 2023-09-18 DIAGNOSIS — Z8719 Personal history of other diseases of the digestive system: Secondary | ICD-10-CM

## 2023-09-18 DIAGNOSIS — G4709 Other insomnia: Secondary | ICD-10-CM | POA: Diagnosis not present

## 2023-09-18 DIAGNOSIS — J849 Interstitial pulmonary disease, unspecified: Secondary | ICD-10-CM

## 2023-09-18 NOTE — Patient Instructions (Signed)
 Exercises for Golfer's Elbow Elbow exercises can help you get better if you have golfer's elbow. Only do the exercises you were told to do. Make sure you know how to do the exercises safely. Follow the steps below. It's normal to feel mild discomfort. Stop if you feel pain or your pain gets worse. Do not start these exercises until told by your health care provider. Stretching and range-of-motion exercises These exercises warm up your muscles and joints. They can help your elbow move better and be more flexible. Wrist extension, assisted  Straighten your left / right elbow in front of you with your palm facing up toward the ceiling. If told by your provider, bend your left / right elbow to a 90-degree angle (right angle) at your side. Do this instead of holding it straight. With your other hand, gently pull your left / right hand and fingers toward the floor. Stop when you feel a gentle stretch on the palm side of your forearm. Hold this position for __________ seconds. Repeat __________ times. Do this exercise __________ times a day. Wrist flexion, assisted  Straighten your left / right elbow in front of you with your palm facing down toward the floor. If told by your provider, bend your left / right elbow to a 90-degree angle at your side. Do this instead of holding it straight. With your other hand, gently push over the back of your left / right hand so your fingers point toward the floor. Stop when you feel a gentle stretch on the back of your forearm. Hold this position for __________ seconds. Repeat __________ times. Do this exercise __________ times a day. Assisted forearm rotation, supination  Sit or stand with your elbows at your side. Bend your left / right elbow to a 90-degree angle. Using your uninjured hand, turn your left / right palm up toward the ceiling. Stop when you feel a gentle stretch along the inside of your forearm. Hold this position for __________ seconds. Repeat  __________ times. Do this exercise __________ times a day. Assisted forearm rotation, pronation  Sit or stand with your elbows at your side. Bend your left / right elbow to a 90-degree angle. Using your uninjured hand, turn your left / right palm down toward the floor. Stop when you feel a gentle stretch along the top of your forearm. Hold this position for __________ seconds. Repeat __________ times. Do this exercise __________ times a day. Strengthening exercises These exercises build strength and endurance in your elbow. Endurance is the ability to use your muscles for a long time, even after they get tired. Wrist flexion  Sit with your left / right forearm supported on a table or other surface. Turn your palm up toward the ceiling. Let your left / right wrist extend over the edge of the surface. Hold a __________ weight or a piece of rubber exercise band or tubing. If using a rubber exercise band or tubing, hold the other end of the tubing with your other hand. Slowly bend your wrist so your hand moves up toward the ceiling. Try to only move your wrist. Keep the rest of your arm still. Hold this position for __________ seconds. Slowly go back to the starting position. Repeat __________ times. Do this exercise __________ times a day. Wrist flexion, eccentric  Sit with your left / right forearm supported on a table or other surface. Turn your palm up toward the ceiling. Let your left / right wrist extend over the edge of the  surface. Hold a __________ weight or a piece of rubber exercise band or tubing in your left / right hand. If using a rubber exercise band or tubing, hold the other end of the tubing with your other hand. Use your uninjured hand to move your left / right hand up toward the ceiling. Take your other hand away. Slowly go back to the starting position using only your left / right hand. Repeat __________ times. Do this exercise __________ times a day. Forearm rotation,  pronation To do this exercise, you'll need a lightweight hammer or rubber mallet. Sit with your left / right forearm supported on a table or other surface. Bend your elbow to a 90-degree angle. Have your forearm so that your palm is facing up toward the ceiling, with your hand resting over the edge of the table. Hold a hammer in your left / right hand. To make this exercise easier, hold the hammer near the head of the hammer. To make this exercise harder, hold the hammer near the end of the handle. Without moving your elbow, slowly turn your forearm so your palm faces down toward the floor. Hold this position for __________ seconds. Slowly return to the starting position. Repeat __________ times. Do this exercise __________ times a day. Shoulder blade squeeze     Sit in a stable chair or stand with good posture. If you're sitting down, don't let your back touch the back of the chair. Your arms should be at your sides with your elbows bent to a 90-degree angle. Position your forearms so that your thumbs are facing the ceiling. Without lifting your shoulders up, squeeze your shoulder blades tightly together. Hold this position for __________ seconds. Slowly release. Go back to the starting position. Repeat __________ times. Do this exercise __________ times a day. This information is not intended to replace advice given to you by your health care provider. Make sure you discuss any questions you have with your health care provider. Document Revised: 09/28/2022 Document Reviewed: 09/28/2022 Elsevier Patient Education  2024 [employer].

## 2023-09-18 NOTE — Progress Notes (Signed)
 Office Visit Note  Patient: Nichole Reyes             Date of Birth: Sep 10, 1950           MRN: [id number]             PCP: Dow Longs, PA-C Referring: Dow Longs, PA-C Visit Date: 09/18/2023 Occupation: @GUAROCC @  Subjective:  Discuss DEXA results  History of Present Illness: Nichole Reyes is a 73 y.o. female with history of osteoarthritis, DDD, and pulmonary fibrosis.  Patient remains under the care of Dr. Geronimo and is taking Esbriet  as prescribed.  Patient states that her PFTs, exercise test, and symptoms have remained stable.  She uses albuterol  as needed especially if walking prolonged distances outdoors. Patient continues to have chronic pain in both knee joints.  Patient states for several years she has noticed a burning sensation in her toes as well as redness on the bottom of her feet intermittently.  She has not been evaluated by neurology and has not had a nerve conduction study in the past.  She has noticed diminished sensation in her toes. Patient reports that overall her symptoms and fibromyalgia have been stable.  She takes tizanidine  2 mg at bedtime as needed for muscle spasms.  She experiences occasional soreness over the medial epicondyle of the right elbow.  Her energy level has been stable overall.   Activities of Daily Living:  Patient reports morning stiffness for 2-3 minutes.   Patient Denies nocturnal pain.  Difficulty dressing/grooming: Denies Difficulty climbing stairs: Reports Difficulty getting out of chair: Denies Difficulty using hands for taps, buttons, cutlery, and/or writing: Denies  Review of Systems  Constitutional:  Positive for fatigue.  HENT:  Positive for mouth dryness (in the mornings). Negative for mouth sores.   Eyes:  Negative for dryness.  Respiratory:  Positive for shortness of breath.   Cardiovascular:  Positive for palpitations. Negative for chest pain.  Gastrointestinal:  Negative for blood in stool, constipation and  diarrhea.  Endocrine: Positive for increased urination.  Genitourinary:  Positive for involuntary urination.  Musculoskeletal:  Positive for joint pain, joint pain, joint swelling, myalgias, morning stiffness and myalgias. Negative for gait problem, muscle weakness and muscle tenderness.  Skin:  Positive for sensitivity to sunlight. Negative for color change, rash and hair loss.  Allergic/Immunologic: Negative for susceptible to infections.  Neurological:  Positive for headaches. Negative for dizziness.  Hematological:  Negative for swollen glands.  Psychiatric/Behavioral:  Positive for depressed mood. Negative for sleep disturbance. The patient is not nervous/anxious.     PMFS History:  Patient Active Problem List   Diagnosis Date Noted   Closed fracture of proximal end of left humerus with routine healing 05/19/2022   DVT (deep venous thrombosis) (HCC) 04/21/2022   Acute pulmonary embolism (HCC) 03/17/2022   Type 2 diabetes mellitus (HCC) 03/17/2022   Essential hypertension 03/17/2022   Coronary artery calcification seen on CT scan 03/17/2022   IPF (idiopathic pulmonary fibrosis) (HCC) 09/29/2021   DOE (dyspnea on exertion) 09/29/2021   ILD (interstitial lung disease) (HCC) 08/26/2019   Fibromyalgia syndrome 06/26/2016   Other fatigue 06/26/2016   Other insomnia 06/26/2016   Arthralgia of both knees 06/26/2016   Pain in both feet 06/26/2016   Primary osteoarthritis of both feet 06/26/2016   Primary osteoarthritis of both knees 06/26/2016   DDD (degenerative disc disease), cervical 06/26/2016   DDD (degenerative disc disease), thoracic 06/26/2016   DDD (degenerative disc disease), lumbar 06/26/2016   History  of hypertension 06/26/2016   History of high cholesterol 06/26/2016   Other sleep apnea 06/26/2016   History of osteopenia 06/26/2016   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   THYROID  NODULE, LEFT 01/03/2008   Hyperlipidemia 01/03/2008   DEPRESSION 01/03/2008    Migraine headache 01/03/2008   Allergic rhinitis 01/03/2008   Alopecia 01/03/2008   Allergic arthritis 01/03/2008    Past Medical History:  Diagnosis Date   Broken humerus 02/2022   right   Coronary artery calcification seen on CT scan    Essential hypertension    Fibromyalgia    Idiopathic pulmonary fibrosis (HCC)    Mixed hyperlipidemia    Osteoporosis    Type 2 diabetes mellitus (HCC)     Family History  Problem Relation Age of Onset   Hypertension Mother    Diabetes Mother    Osteoporosis Mother    Hypertension Father    Heart disease Father    COPD Father    Colon cancer Sister    Colon cancer Brother    Heart disease Brother    Cancer Brother    Heart disease Brother    Emphysema Paternal Grandfather    Past Surgical History:  Procedure Laterality Date   ABDOMINAL HYSTERECTOMY  2007   CATARACT EXTRACTION Bilateral    Social History   Social History Narrative   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   Immunization History  Administered Date(s) Administered   Fluad Quad(high Dose 65+) 01/29/2019, 12/16/2019, 01/24/2021   Influenza Split 12/18/2017   Influenza-Unspecified 11/18/2013   PFIZER(Purple Top)SARS-COV-2 Vaccination 05/15/2019, 06/13/2019   PNEUMOCOCCAL CONJUGATE-20 03/20/2022, 02/28/2023   Tdap 05/04/1999   Zoster Recombinant(Shingrix) 03/15/2023, 05/16/2023     Objective: Vital Signs: BP 114/70 (BP Location: Left Arm, Patient Position: Sitting, Cuff Size: Normal)   Pulse 84   Resp 16   Ht 5' 5 (1.651 m)   Wt 165 lb 12.8 oz (75.2 kg)   BMI 27.59 kg/m    Physical Exam Vitals and nursing note reviewed.  Constitutional:      Appearance: She is well-developed.  HENT:     Head: Normocephalic and atraumatic.   Eyes:     Conjunctiva/sclera: Conjunctivae normal.    Cardiovascular:     Rate and Rhythm: Normal rate and regular rhythm.     Heart sounds: Normal heart sounds.  Pulmonary:     Effort: Pulmonary effort is  normal.     Breath sounds: Normal breath sounds.  Abdominal:     General: Bowel sounds are normal.     Palpations: Abdomen is soft.   Musculoskeletal:     Cervical back: Normal range of motion.  Lymphadenopathy:     Cervical: No cervical adenopathy.   Skin:    General: Skin is warm and dry.     Capillary Refill: Capillary refill takes less than 2 seconds.   Neurological:     Mental Status: She is alert and oriented to person, place, and time.   Psychiatric:        Behavior: Behavior normal.      Musculoskeletal Exam: C-spine has slightly limited range of motion without rotation.  Thoracic kyphosis noted.  Shoulder joints, elbow joints, wrist joints and MCPs and PIPs, DIPs have good range of motion with no synovitis.  Complete fist formation bilaterally.  Hip joints have good range of motion with no groin pain.  Knee joints have good range of motion no warmth or effusion.  Ankle joints have good  range of motion with no tenderness or joint swelling.  CDAI Exam: CDAI Score: -- Patient Global: --; Provider Global: -- Swollen: --; Tender: -- Joint Exam 09/18/2023   No joint exam has been documented for this visit   There is currently no information documented on the homunculus. Go to the Rheumatology activity and complete the homunculus joint exam.  Investigation: No additional findings.  Imaging: No results found.  Recent Labs: Lab Results  Component Value Date   WBC 9.9 02/13/2023   HGB 13.2 02/13/2023   PLT 493 (H) 02/13/2023   NA 141 02/13/2023   K 4.6 02/13/2023   CL 104 02/13/2023   CO2 28 02/13/2023   GLUCOSE 117 (H) 02/13/2023   BUN 15 02/13/2023   CREATININE 0.79 02/13/2023   BILITOT 0.2 09/06/2023   ALKPHOS 69 09/06/2023   AST 23 09/06/2023   ALT 21 09/06/2023   PROT 7.4 09/06/2023   ALBUMIN 4.2 09/06/2023   CALCIUM 9.7 02/13/2023   GFRAA 101 03/15/2017    Speciality Comments: No specialty comments available.  Procedures:  No procedures  performed Allergies: Latex, Pseudoephedrine, Sulfa antibiotics, and Sulfonamide derivatives    Assessment / Plan:     Visit Diagnoses: Primary osteoarthritis of both knees: She has good range of motion of both knee joints on examination today.  No warmth or effusion noted.  She continues to have chronic pain and stiffness in both knee joints.  She had an MRI of the right knee on 08/25/2022 which revealed a complex tear of the anterior horn body junction of the lateral meniscus. She has not proceeded with surgical intervention and is not ready to do so.   Primary osteoarthritis of both feet: She has good range of motion of both ankle joints with no tenderness or joint swelling.  No tenderness of MTP joints.  Patient has been experiencing a burning sensation in her feet as well as some diminished sensation in her toes.  Recommended an evaluation by neurology with a possible nerve conduction study for further evaluation.  DDD (degenerative disc disease), cervical: C-spine has limited range of motion with lateral rotation.  No symptoms of radiculopathy.  Postural kyphosis of thoracic region: Severe thoracic kyphosis noted.  DDD (degenerative disc disease), thoracic: Severe thoracic kyphosis noted.  Degeneration of intervertebral disc of lumbar region without discogenic back pain or lower extremity pain: She is not experiencing any increased discomfort in her lower back at this time.  No symptoms of radiculopathy.  ILD (interstitial lung disease) (HCC) - She remains under the care of Dr. Emeline office visit note from 09/06/2023: PFTs were send September 2023 but stable since March 2024-June 2025.  Symptoms and exercise testing stable.  She remains on Esbriet  as prescribed.  She uses albuterol  as needed.  Fibromyalgia: She experiences intermittent myalgias and muscle tenderness due to fibromyalgia.  Overall her symptoms have remained stable.  She has been taking tizanidine  2 mg at bedtime for  muscle spasms.  Discussed the importance of regular exercise and good sleep hygiene.  Medial epicondylitis of right elbow: She experiences intermittent soreness over the medial epicondyle of the right elbow.  She was given a handout of exercises to perform.  She was advised to notify us  if her symptoms persist or worsen.  Other fatigue: Chronic, stable.  Discussed the importance of regular exercise and good sleep hygiene.  Other insomnia: She takes tizanidine  2 mg at bedtime for muscle spasms and insomnia.  Age-related osteoporosis without current pathological fracture - Previous DEXA  08/12/19: L1-L4 BMD 0.813 with T-score -2.1.  Thoracic kyphosis.  History of left humerus fracture.  Updated DEXA on 11/09/2022-ordered by Dr. Latisha: AP spine T-score -2.6.  Left femoral neck T-score -1.3.  Right femoral neck T-score -1.4.  Reviewed DEXA results today with the patient in detail.  Discussed that she is no longer osteopenic and is now osteoporotic.  Different treatment options were discussed today for management of osteoporosis.  We discussed the option of oral Fosamax once weekly but she is apprehensive given history of GERD and a handle hernia.  She is taking Prilosec as prescribed. Discussed the option of IV Reclast--reviewed indications, contraindications, potential side effects today.  All questions were addressed.  No upcoming dental work scheduled.  Patient was given informational handout about IV Reclast to review.  Patient plans on following up with Dr. German to further discuss DEXA results and treatment options.  She is not yet ready to initiate therapy.  Other medical conditions are listed as follows:  History of hypertension: Blood pressure was 114/70 today in the office.  History of hypercholesterolemia  Family history of rheumatoid arthritis  History of migraine  History of sleep apnea  History of depression  History of hiatal hernia  History of gastroesophageal reflux  (GERD)  Orders: No orders of the defined types were placed in this encounter.  No orders of the defined types were placed in this encounter.   Follow-Up Instructions: Return in about 6 months (around 03/20/2024) for Osteoarthritis, DDD.   Waddell CHRISTELLA Craze, PA-C  Note - This record has been created using Dragon software.  Chart creation errors have been sought, but may not always  have been located. Such creation errors do not reflect on  the standard of medical care.

## 2023-10-10 ENCOUNTER — Other Ambulatory Visit: Payer: Self-pay

## 2023-10-10 NOTE — Progress Notes (Signed)
 Specialty Pharmacy Refill Coordination Note  Nichole Reyes is a 73 y.o. female contacted today regarding refills of specialty medication(s) Pirfenidone    Patient requested Delivery   Delivery date: 10/17/23   Verified address: [address] [place] [address]   Medication will be filled on 10/16/23.

## 2023-10-16 ENCOUNTER — Other Ambulatory Visit: Payer: Self-pay

## 2023-10-25 DIAGNOSIS — H353132 Nonexudative age-related macular degeneration, bilateral, intermediate dry stage: Secondary | ICD-10-CM | POA: Diagnosis not present

## 2023-10-25 DIAGNOSIS — H5212 Myopia, left eye: Secondary | ICD-10-CM | POA: Diagnosis not present

## 2023-11-08 ENCOUNTER — Other Ambulatory Visit: Payer: Self-pay

## 2023-11-13 ENCOUNTER — Other Ambulatory Visit (HOSPITAL_COMMUNITY): Payer: Self-pay

## 2023-11-14 ENCOUNTER — Other Ambulatory Visit: Payer: Self-pay

## 2023-11-14 NOTE — Progress Notes (Signed)
 Specialty Pharmacy Refill Coordination Note  Nichole Reyes is a 73 y.o. female contacted today regarding refills of specialty medication(s) Pirfenidone    Patient requested Delivery   Delivery date: 11/27/23   Verified address: [address] [place]  [address]   Medication will be filled on 11/26/23.

## 2023-11-20 DIAGNOSIS — Z1231 Encounter for screening mammogram for malignant neoplasm of breast: Secondary | ICD-10-CM | POA: Diagnosis not present

## 2023-11-26 ENCOUNTER — Other Ambulatory Visit: Payer: Self-pay

## 2023-11-30 DIAGNOSIS — J3489 Other specified disorders of nose and nasal sinuses: Secondary | ICD-10-CM | POA: Diagnosis not present

## 2023-11-30 DIAGNOSIS — R519 Headache, unspecified: Secondary | ICD-10-CM | POA: Diagnosis not present

## 2023-11-30 DIAGNOSIS — R051 Acute cough: Secondary | ICD-10-CM | POA: Diagnosis not present

## 2023-12-04 ENCOUNTER — Other Ambulatory Visit: Payer: Self-pay | Admitting: Internal Medicine

## 2023-12-04 ENCOUNTER — Other Ambulatory Visit: Payer: Self-pay | Admitting: Physician Assistant

## 2023-12-04 DIAGNOSIS — K219 Gastro-esophageal reflux disease without esophagitis: Secondary | ICD-10-CM

## 2023-12-04 DIAGNOSIS — R0609 Other forms of dyspnea: Secondary | ICD-10-CM

## 2023-12-04 DIAGNOSIS — J849 Interstitial pulmonary disease, unspecified: Secondary | ICD-10-CM

## 2023-12-04 NOTE — Telephone Encounter (Signed)
 Last Fill: 08/31/2023  Next Visit: 03/26/2024  Last Visit: 09/18/2023  Dx: Other insomnia   Current Dose per office note on 09/18/2023: tizanidine  2 mg at bedtime for muscle spasms and insomnia   Okay to refill Tizanidine ?

## 2023-12-19 ENCOUNTER — Other Ambulatory Visit: Payer: Self-pay | Admitting: Internal Medicine

## 2023-12-19 ENCOUNTER — Other Ambulatory Visit: Payer: Self-pay

## 2023-12-19 ENCOUNTER — Other Ambulatory Visit (HOSPITAL_COMMUNITY): Payer: Self-pay

## 2023-12-19 DIAGNOSIS — J84112 Idiopathic pulmonary fibrosis: Secondary | ICD-10-CM

## 2023-12-19 DIAGNOSIS — Z5181 Encounter for therapeutic drug level monitoring: Secondary | ICD-10-CM

## 2023-12-19 MED ORDER — PIRFENIDONE 801 MG PO TABS
801.0000 mg | ORAL_TABLET | Freq: Three times a day (TID) | ORAL | 1 refills | Status: DC
  Filled 2023-12-19: qty 90, 30d supply, fill #0
  Filled 2024-01-16: qty 90, 30d supply, fill #1
  Filled 2024-02-13: qty 90, 30d supply, fill #2
  Filled 2024-03-12: qty 90, 30d supply, fill #3

## 2023-12-19 NOTE — Progress Notes (Signed)
 Specialty Pharmacy Refill Coordination Note  Spoke with Elveria VEAR Spiller  Nichole Reyes is a 73 y.o. female contacted today regarding refills of specialty medication(s) Pirfenidone   Doses on hand: 9 (27 tablets)  Patient requested: Delivery   Delivery date: 12/25/23   Verified address: [address] [place] [address]  Medication will be filled on 12/24/23.  This fill date is pending response to refill request from provider. Patient is aware and if they have not received fill by intended date, they must follow up with pharmacy.

## 2023-12-19 NOTE — Telephone Encounter (Signed)
 Refill sent for ESBRIET  to [facility]: [phone]   Dose: 801mg  by mouth three times daily   Last OV: 09/06/23 Provider: Geronimo Pertinent labs: LFTs wnl (June 2025) - next due now   Next OV: Dec 2025, not yet scheduled with provider.   Ordered LFTs for patient to have drawn at [facility] in [place], [place], close to their location - she plans to go within the next week. Patient has one week of pirfenidone  left, so sending a refill now to avoid gap in therapy.  Called [facility] to confirm they can see the order.   Aleck Puls, PharmD, BCPS Clinical Pharmacist  [facility]

## 2023-12-19 NOTE — Telephone Encounter (Signed)
 Pt requesting refill of specialty medication - routing to Rx team to advise.

## 2023-12-20 DIAGNOSIS — Z5181 Encounter for therapeutic drug level monitoring: Secondary | ICD-10-CM | POA: Diagnosis not present

## 2023-12-21 LAB — HEPATIC FUNCTION PANEL
ALT: 11 IU/L (ref 0–32)
AST: 17 IU/L (ref 0–40)
Albumin: 4.1 g/dL (ref 3.9–4.9)
Alkaline Phosphatase: 76 IU/L (ref 49–135)
Bilirubin Total: 0.2 mg/dL (ref 0.0–1.2)
Bilirubin, Direct: 0.1 mg/dL (ref 0.00–0.40)
Total Protein: 6.5 g/dL (ref 6.0–8.5)

## 2023-12-24 ENCOUNTER — Other Ambulatory Visit: Payer: Self-pay

## 2023-12-24 ENCOUNTER — Other Ambulatory Visit (HOSPITAL_COMMUNITY): Payer: Self-pay

## 2023-12-24 ENCOUNTER — Ambulatory Visit: Payer: Self-pay

## 2024-01-08 ENCOUNTER — Other Ambulatory Visit: Payer: Self-pay | Admitting: Physician Assistant

## 2024-01-08 NOTE — Telephone Encounter (Signed)
 Last Fill: 12/04/2023  Next Visit: 03/26/2024  Last Visit: 09/18/2023  Dx: Fibromyalgia   Current Dose per office note on 09/18/2023: tizanidine  2 mg at bedtime for muscle spasms.   Okay to refill Tizanidine ?

## 2024-01-10 DIAGNOSIS — M810 Age-related osteoporosis without current pathological fracture: Secondary | ICD-10-CM | POA: Diagnosis not present

## 2024-01-16 ENCOUNTER — Other Ambulatory Visit: Payer: Self-pay

## 2024-01-18 ENCOUNTER — Other Ambulatory Visit: Payer: Self-pay

## 2024-01-18 NOTE — Progress Notes (Signed)
 Specialty Pharmacy Refill Coordination Note  Nichole Reyes is a 73 y.o. female contacted today regarding refills of specialty medication(s) Pirfenidone    Patient requested Delivery   Delivery date: 01/22/24   Verified address: [address] [place] [address]   Medication will be filled on: 01/21/24

## 2024-01-21 ENCOUNTER — Other Ambulatory Visit: Payer: Self-pay

## 2024-02-04 ENCOUNTER — Other Ambulatory Visit: Payer: Self-pay

## 2024-02-05 ENCOUNTER — Other Ambulatory Visit: Payer: Self-pay

## 2024-02-05 NOTE — Progress Notes (Signed)
 Specialty Pharmacy Ongoing Clinical Assessment Note  Nichole Reyes is a 73 y.o. female who is being followed by the specialty pharmacy service for RxSp Interstitial Lung Disease   Patient's specialty medication(s) reviewed today: Pirfenidone    Missed doses in the last 4 weeks: 0   Patient/Caregiver did not have any additional questions or concerns.   Therapeutic benefit summary: Patient is achieving benefit   Adverse events/side effects summary: No adverse events/side effects   Patient's therapy is appropriate to: Continue    Goals Addressed             This Visit's Progress    Maintain optimal adherence to therapy   On track    Patient is on track. Patient will maintain adherence and adhere to provider and/or lab appointments         Follow up: 12 months  Dylann Gallier M Damyon Mullane Specialty Pharmacist

## 2024-02-07 ENCOUNTER — Other Ambulatory Visit: Payer: Self-pay | Admitting: Cardiology

## 2024-02-07 ENCOUNTER — Other Ambulatory Visit: Payer: Self-pay | Admitting: Physician Assistant

## 2024-02-07 NOTE — Telephone Encounter (Signed)
 Last Fill: 01/08/2024  Next Visit: 03/26/2024  Last Visit: 09/18/2023  Dx: Fibromyalgia   Current Dose per office note on 09/18/2023: tizanidine  2 mg at bedtime for muscle spasms.   Okay to refill Tizanidine ?

## 2024-02-13 ENCOUNTER — Other Ambulatory Visit: Payer: Self-pay

## 2024-02-15 ENCOUNTER — Other Ambulatory Visit: Payer: Self-pay

## 2024-02-15 NOTE — Progress Notes (Signed)
 Specialty Pharmacy Refill Coordination Note  Nichole Reyes is a 73 y.o. female contacted today regarding refills of specialty medication(s) Pirfenidone    Patient requested Delivery   Delivery date: 02/25/24   Verified address: [address] [place] [address]   Medication will be filled on: 02/22/24

## 2024-02-18 ENCOUNTER — Other Ambulatory Visit: Payer: Self-pay

## 2024-02-18 NOTE — Progress Notes (Signed)
 Clinical Intervention Note  Clinical Intervention Notes: Patient reported starting Jubbonti injection. No DDIs identified with pirfenidone .   Clinical Intervention Outcomes: Prevention of an adverse drug event   Advertising Account Planner

## 2024-02-22 ENCOUNTER — Other Ambulatory Visit: Payer: Self-pay

## 2024-03-02 ENCOUNTER — Other Ambulatory Visit: Payer: Self-pay | Admitting: Internal Medicine

## 2024-03-05 ENCOUNTER — Encounter (INDEPENDENT_AMBULATORY_CARE_PROVIDER_SITE_OTHER): Payer: Self-pay | Admitting: *Deleted

## 2024-03-08 ENCOUNTER — Other Ambulatory Visit: Payer: Self-pay | Admitting: Internal Medicine

## 2024-03-08 ENCOUNTER — Other Ambulatory Visit: Payer: Self-pay | Admitting: Physician Assistant

## 2024-03-08 DIAGNOSIS — J849 Interstitial pulmonary disease, unspecified: Secondary | ICD-10-CM

## 2024-03-08 DIAGNOSIS — R0609 Other forms of dyspnea: Secondary | ICD-10-CM

## 2024-03-08 DIAGNOSIS — K219 Gastro-esophageal reflux disease without esophagitis: Secondary | ICD-10-CM

## 2024-03-10 ENCOUNTER — Encounter

## 2024-03-10 NOTE — Telephone Encounter (Signed)
 Last Fill: 02/07/2024  Next Visit: 03/26/2024  Last Visit: 09/18/2023  Dx: Fibromyalgia   Current Dose per office note on 09/18/2023:  tizanidine  2 mg at bedtime for muscle spasms.   Okay to refill Tizanidine ?

## 2024-03-12 ENCOUNTER — Other Ambulatory Visit: Payer: Self-pay

## 2024-03-17 NOTE — Progress Notes (Deleted)
 "  Office Visit Note  Patient: Nichole Reyes             Date of Birth: [birth year]           MRN: [id number]             PCP: Dow Longs, PA-C Referring: Dow Longs, PA-C Visit Date: 03/26/2024 Occupation: Data Unavailable  Subjective:  No chief complaint on file.   History of Present Illness: Nichole Reyes is a 73 y.o. female ***     Activities of Daily Living:  Patient reports morning stiffness for *** {minute/hour:19697}.   Patient {ACTIONS;DENIES/REPORTS:21021675::Denies} nocturnal pain.  Difficulty dressing/grooming: {ACTIONS;DENIES/REPORTS:21021675::Denies} Difficulty climbing stairs: {ACTIONS;DENIES/REPORTS:21021675::Denies} Difficulty getting out of chair: {ACTIONS;DENIES/REPORTS:21021675::Denies} Difficulty using hands for taps, buttons, cutlery, and/or writing: {ACTIONS;DENIES/REPORTS:21021675::Denies}  No Rheumatology ROS completed.   PMFS History:  Patient Active Problem List   Diagnosis Date Noted   Closed fracture of proximal end of left humerus with routine healing 05/19/2022   DVT (deep venous thrombosis) (HCC) 04/21/2022   Acute pulmonary embolism (HCC) 03/17/2022   Type 2 diabetes mellitus (HCC) 03/17/2022   Essential hypertension 03/17/2022   Coronary artery calcification seen on CT scan 03/17/2022   IPF (idiopathic pulmonary fibrosis) (HCC) 09/29/2021   DOE (dyspnea on exertion) 09/29/2021   ILD (interstitial lung disease) (HCC) 08/26/2019   Fibromyalgia syndrome 06/26/2016   Other fatigue 06/26/2016   Other insomnia 06/26/2016   Arthralgia of both knees 06/26/2016   Pain in both feet 06/26/2016   Primary osteoarthritis of both feet 06/26/2016   Primary osteoarthritis of both knees 06/26/2016   DDD (degenerative disc disease), cervical 06/26/2016   DDD (degenerative disc disease), thoracic 06/26/2016   DDD (degenerative disc disease), lumbar 06/26/2016   History of hypertension 06/26/2016   History of high cholesterol  06/26/2016   Other sleep apnea 06/26/2016   History of osteopenia 06/26/2016   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   THYROID  NODULE, LEFT 01/03/2008   Hyperlipidemia 01/03/2008   DEPRESSION 01/03/2008   Migraine headache 01/03/2008   Allergic rhinitis 01/03/2008   Alopecia 01/03/2008   Allergic arthritis 01/03/2008    Past Medical History:  Diagnosis Date   Broken humerus 02/2022   right   Coronary artery calcification seen on CT scan    Essential hypertension    Fibromyalgia    Idiopathic pulmonary fibrosis (HCC)    Mixed hyperlipidemia    Osteoporosis    Type 2 diabetes mellitus (HCC)     Family History  Problem Relation Age of Onset   Hypertension Mother    Diabetes Mother    Osteoporosis Mother    Hypertension Father    Heart disease Father    COPD Father    Colon cancer Sister    Colon cancer Brother    Heart disease Brother    Cancer Brother    Heart disease Brother    Emphysema Paternal Grandfather    Past Surgical History:  Procedure Laterality Date   ABDOMINAL HYSTERECTOMY  2007   CATARACT EXTRACTION Bilateral    Social History[1] Social History   Social History Narrative   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     Immunization History  Administered Date(s) Administered   Fluad Quad(high Dose 65+) 01/29/2019, 12/16/2019, 01/24/2021   Influenza Split 12/18/2017   Influenza-Unspecified 11/18/2013   PFIZER(Purple Top)SARS-COV-2 Vaccination 05/15/2019, 06/13/2019   PNEUMOCOCCAL CONJUGATE-20 03/20/2022, 02/28/2023   Tdap 05/04/1999   Zoster Recombinant(Shingrix) 03/15/2023, 05/16/2023     Objective:  Vital Signs: There were no vitals taken for this visit.   Physical Exam   Musculoskeletal Exam: ***  CDAI Exam: CDAI Score: -- Patient Global: --; Provider Global: -- Swollen: --; Tender: -- Joint Exam 03/26/2024   No joint exam has been documented for this visit   There is currently no information documented on  the homunculus. Go to the Rheumatology activity and complete the homunculus joint exam.  Investigation: No additional findings.  Imaging: No results found.  Recent Labs: Lab Results  Component Value Date   WBC 9.9 02/13/2023   HGB 13.2 02/13/2023   PLT 493 (H) 02/13/2023   NA 141 02/13/2023   K 4.6 02/13/2023   CL 104 02/13/2023   CO2 28 02/13/2023   GLUCOSE 117 (H) 02/13/2023   BUN 15 02/13/2023   CREATININE 0.79 02/13/2023   BILITOT 0.2 12/20/2023   ALKPHOS 76 12/20/2023   AST 17 12/20/2023   ALT 11 12/20/2023   PROT 6.5 12/20/2023   ALBUMIN 4.1 12/20/2023   CALCIUM 9.7 02/13/2023   GFRAA 101 03/15/2017    Speciality Comments: No specialty comments available.  Procedures:  No procedures performed Allergies: Latex, Pseudoephedrine, Sulfa antibiotics, and Sulfonamide derivatives   Assessment / Plan:     Visit Diagnoses: No diagnosis found.  Orders: No orders of the defined types were placed in this encounter.  No orders of the defined types were placed in this encounter.   Face-to-face time spent with patient was *** minutes. Greater than 50% of time was spent in counseling and coordination of care.  Follow-Up Instructions: No follow-ups on file.   Daved JAYSON Gavel, CMA  Note - This record has been created using Animal nutritionist.  Chart creation errors have been sought, but may not always  have been located. Such creation errors do not reflect on  the standard of medical care.    [1]  Social History Tobacco Use   Smoking status: Never    Passive exposure: Past   Smokeless tobacco: Never  Vaping Use   Vaping status: Never Used  Substance Use Topics   Alcohol use: Not Currently    Comment: ocassional   Drug use: No   "

## 2024-03-18 ENCOUNTER — Other Ambulatory Visit: Payer: Self-pay

## 2024-03-18 NOTE — Progress Notes (Signed)
 Specialty Pharmacy Refill Coordination Note  Nichole Reyes is a 73 y.o. female contacted today regarding refills of specialty medication(s) Pirfenidone    Patient requested Delivery   Delivery date: 03/25/24   Verified address: [address] [place] [address]   Medication will be filled on: 03/24/24

## 2024-03-24 ENCOUNTER — Other Ambulatory Visit: Payer: Self-pay

## 2024-03-25 ENCOUNTER — Ambulatory Visit: Admitting: Internal Medicine

## 2024-03-25 ENCOUNTER — Encounter: Payer: Self-pay | Admitting: Internal Medicine

## 2024-03-25 ENCOUNTER — Ambulatory Visit: Admitting: *Deleted

## 2024-03-25 VITALS — BP 128/66 | HR 84 | Temp 97.7°F | Ht 65.0 in | Wt 165.0 lb

## 2024-03-25 DIAGNOSIS — R0789 Other chest pain: Secondary | ICD-10-CM | POA: Diagnosis not present

## 2024-03-25 DIAGNOSIS — J84112 Idiopathic pulmonary fibrosis: Secondary | ICD-10-CM | POA: Diagnosis not present

## 2024-03-25 DIAGNOSIS — Z5181 Encounter for therapeutic drug level monitoring: Secondary | ICD-10-CM

## 2024-03-25 DIAGNOSIS — Z86711 Personal history of pulmonary embolism: Secondary | ICD-10-CM | POA: Diagnosis not present

## 2024-03-25 DIAGNOSIS — K219 Gastro-esophageal reflux disease without esophagitis: Secondary | ICD-10-CM

## 2024-03-25 DIAGNOSIS — R0609 Other forms of dyspnea: Secondary | ICD-10-CM

## 2024-03-25 LAB — COMPREHENSIVE METABOLIC PANEL WITH GFR
ALT: 10 U/L (ref 3–35)
AST: 16 U/L (ref 5–37)
Albumin: 4.3 g/dL (ref 3.5–5.2)
Alkaline Phosphatase: 54 U/L (ref 39–117)
BUN: 15 mg/dL (ref 6–23)
CO2: 30 meq/L (ref 19–32)
Calcium: 9.9 mg/dL (ref 8.4–10.5)
Chloride: 104 meq/L (ref 96–112)
Creatinine, Ser: 1.02 mg/dL (ref 0.40–1.20)
GFR: 55.48 mL/min — ABNORMAL LOW
Glucose, Bld: 93 mg/dL (ref 70–99)
Potassium: 4.7 meq/L (ref 3.5–5.1)
Sodium: 140 meq/L (ref 135–145)
Total Bilirubin: 0.3 mg/dL (ref 0.2–1.2)
Total Protein: 7.4 g/dL (ref 6.0–8.3)

## 2024-03-25 LAB — CBC WITH DIFFERENTIAL/PLATELET
Basophils Absolute: 0.1 K/uL (ref 0.0–0.1)
Basophils Relative: 1 % (ref 0.0–3.0)
Eosinophils Absolute: 0.1 K/uL (ref 0.0–0.7)
Eosinophils Relative: 1.3 % (ref 0.0–5.0)
HCT: 42.8 % (ref 36.0–46.0)
Hemoglobin: 14 g/dL (ref 12.0–15.0)
Lymphocytes Relative: 22.5 % (ref 12.0–46.0)
Lymphs Abs: 2.5 K/uL (ref 0.7–4.0)
MCHC: 32.6 g/dL (ref 30.0–36.0)
MCV: 94.6 fl (ref 78.0–100.0)
Monocytes Absolute: 0.9 K/uL (ref 0.1–1.0)
Monocytes Relative: 8.2 % (ref 3.0–12.0)
Neutro Abs: 7.4 K/uL (ref 1.4–7.7)
Neutrophils Relative %: 67 % (ref 43.0–77.0)
Platelets: 453 K/uL — ABNORMAL HIGH (ref 150.0–400.0)
RBC: 4.53 Mil/uL (ref 3.87–5.11)
RDW: 14 % (ref 11.5–15.5)
WBC: 11.1 K/uL — ABNORMAL HIGH (ref 4.0–10.5)

## 2024-03-25 LAB — PULMONARY FUNCTION TEST
DL/VA % pred: 114 %
DL/VA: 4.69 ml/min/mmHg/L
DLCO cor % pred: 66 %
DLCO cor: 13.33 ml/min/mmHg
DLCO unc % pred: 66 %
DLCO unc: 13.33 ml/min/mmHg
FEF 25-75 Pre: 2.29 L/s
FEF2575-%Pred-Pre: 120 %
FEV1-%Pred-Pre: 75 %
FEV1-Pre: 1.75 L
FEV1FVC-%Pred-Pre: 112 %
FEV6-%Pred-Pre: 69 %
FEV6-Pre: 2.05 L
FEV6FVC-%Pred-Pre: 104 %
FVC-%Pred-Pre: 66 %
FVC-Pre: 2.05 L
Pre FEV1/FVC ratio: 85 %
Pre FEV6/FVC Ratio: 100 %

## 2024-03-25 LAB — BRAIN NATRIURETIC PEPTIDE: Pro B Natriuretic peptide (BNP): 53 pg/mL (ref 1.0–100.0)

## 2024-03-25 NOTE — Progress Notes (Signed)
 Spirometry and diffusion capacity performed today.

## 2024-03-25 NOTE — Progress Notes (Signed)
 "     OV 11/19/2018: Mrs. Jarnagin is a 74 year old woman referred for evaluation of dyspnea for the past several months.  In the same time interval she is also noticed fatigue.  She has previously undergone a negative cardiac evaluation and an outside CT scan of her chest.  Per report, the CT demonstrates fibrotic changes. She is a never- smoker.  She has previously been evaluated by rheumatology, Dr. Dolphus, for fibromyalgia and osteoarthritis.  She began to notice dyspnea on exertion when exercising in February 2020.  She had previously been able to walk 35 to 40 minutes, but decreased to about 20 minutes at a time due to dyspnea.  She was hospitalized in [place] in June 2020 for dyspnea. At that time she was COVID negative.  She was discharged on albuterol , which she has been using up to 4 times a day, which improves her symptoms.  There is no family history of fibrotic lung disease; multiple family members had COPD.  Her maternal grandmother had rheumatoid arthritis, but otherwise there is no family history of rheumatologic disease.  In addition to dyspnea on exertion with occasional dyspnea at rest she endorses cough and sputum production.  She denies wheezing, postnasal drip, coughing or choking when eating, Raynaud's, muscle weakness, rashes, dry eyes or mouth, joint swelling, prolonged morning stiffness.  She has occasional hand stiffness throughout the day, not associated with rest.  She has a hiatal hernia with occasional GERD with symptoms less than once a week, associated with certain foods.  She is a never smoker or vaper.  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  She has never worked in [employer] or shipyards.  She does not have any dusty hobbies.  She has a development worker, international aid, but has never had pet birds.  She does not regularly use hot tubs.  She has never had exposure to medical radiation, methotrexate,  Macrobid, amiodarone, bleomycin.  Her only new medication was Zetia  last month for hypertriglyceridemia.  Otherwise she has not had medication changes in greater than a year.  Last month she has had 6 episodes of epistaxis, which has improved with use of intranasal steroids prescribed by her PCP.     OV 05/06/2019  Subjective:  Patient ID: Elveria VEAR Talbot, female , DOB: 29-Dec-1950 , age 60 y.o. , MRN: [id number] , ADDRESS: [address] [place] [address]   05/06/2019 -   Chief Complaint  Patient presents with   Follow-up    Pt states she has both good days and bad days with breathing. Pt states she will occ have to use her rescue inhaler at least 1-2 times with activities.     HPI ERNESTA TRABERT 74 y.o. -referred by Dr. 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  She was first seen by Dr. Gretta in September 2020 noted above.  She followed up in December 2020 and had pulmonary function test.  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  Due to insurance delays she has not started the nintedanib yet.  She is wondering about this.  She has specific questions about taking Covid vaccine.  She is also worried about her significant cough.  Review shows she is on ACE inhibitor.  She has a tickle in the throat.  She has acid reflux and is on Prilosec.  She has  a history of hiatal hernia.  She is taken a decision on starting nintedanib but she is interested in hearing about the other antifibrotic pirfenidone  because of insurance delay she has not started on anything as yet.   [facility] Comprehensive ILD Questionnaire  Symptoms:  -Insidious onset of shortness of breath 7 months ago.  Since it started it has been the same.  She also has episodic amount of dyspnea.  The severity classification is below.  She does not know when her cough started but she does have a cough associated with clearing of the throat.  It is  moderate in intensity.  She does cough at night.  She does bring up phlegm.  The phlegm is usually white but sometimes yellow.  She does feel a tickle in her throat but does not wake up in the middle of the night because of the cough.  There is no wheezing.  Cough is associated with ACE inhibitor intake.    Past Medical History : Positive for fibromyalgia, hiatal hernia, sleep apnea both for the last few years.  There is also borderline diabetes..  And hyperlipidemia   she denies any asthma or COPD or heart failure rheumatoid arthritis or scleroderma or lupus.  Denies any polymyositis.  Denies Sjogren's.  Denies HIV.  Denies pulmonary hypertension.  Denies thyroid  disease or stroke.  Denies seizures or mononucleosis or hepatitis or tuberculosis.  Denies kidney disease.  Denies pneumonia.  Denies history of blood clots or heart disease or pleurisy.  Autoimmune serology sept 2020  - normal Results for TAMYAH, CUTBIRTH (MRN [id number]) as of 05/06/2019 12:01  Ref. Range 11/19/2018 14:58  SEE BELOW Unknown Comment  Anti Nuclear Antibody (ANA) Latest Ref Range: Negative  Negative  Anti JO-1 Latest Ref Range: 0.0 - 0.9 AI <0.2  CENTROMERE AB SCREEN Latest Ref Range: 0.0 - 0.9 AI <0.2  dsDNA Ab Latest Ref Range: 0 - 9 IU/mL <1  ENA RNP Ab Latest Ref Range: 0.0 - 0.9 AI <0.2  ENA SSA (RO) Ab Latest Ref Range: 0.0 - 0.9 AI <0.2  ENA SSB (LA) Ab Latest Ref Range: 0.0 - 0.9 AI <0.2  ENA SM Ab Ser-aCnc Latest Ref Range: 0.0 - 0.9 AI <0.2  Chromatin Ab SerPl-aCnc Latest Ref Range: 0.0 - 0.9 AI <0.2  Anti-Jo-1 Ab (RDL) Latest Ref Range: <20 Units <20  Anti-PL-7 Ab (RDL) Unknown CANCELED  Anti-PL-12 Ab (RDL) Unknown CANCELED  Anti-EJ Ab (RDL) Unknown CANCELED  Anti-OJ Ab (RDL) Unknown CANCELED  Anti-SRP Ab (RDL) Unknown CANCELED  Anti-Mi-2 Ab (RDL) Unknown CANCELED  Anti-TIF-1gamma Ab (RDL) Latest Units: Units CANCELED  Anti-MDA-5 Ab (CADM-140)(RDL) Latest Units: Units CANCELED  Anti-NXP-2 (P140) Ab  (RDL) Latest Units: Units CANCELED  Anti-SAE1 Ab, IgG (RDL) Latest Units: Units CANCELED  Anti-PM/Scl-100 Ab (RDL) Latest Ref Range: <20 Units <20  Anti-Ku Ab (RDL) Unknown CANCELED  Anti-SS-A 52kD Ab, IgG (RDL) Latest Ref Range: <20 Units <20  Anti-U1 RNP Ab (RDL) Latest Units: Units CANCELED    ROS: She has fatigue for the last several years and also arthralgia.  She attributes this to fibromyalgia.  She also has pain in her fingers which she attributes due to fibromyalgia.  She has heartburn from hiatal hernia she says.  She also snores because of her sleep apnea and she has a rash related to eczema.  But otherwise negative.   FAMILY HISTORY of LUNG DISEASE: She thinks her dad might have had COPD and her grandfather.  However nobody with  pulmonary fibrosis or other lung diseases.   EXPOSURE HISTORY: Denies ever having smoked cigarettes although she grew up in a home with her dad smoked.  Therefore positive for passive smoking as a child.  Denies cigar use.  Denies marijuana use denies cocaine use denies IV drug use.  Denies vaping.   HOME and HOBBY DETAILS : [place]  She is lived in this home for 22 years.  The home is 72 years old.  She has noticed mildew periodically in the shower curtain and in the bathroom.  She does use a CPAP mask but there is no water circuit mold or mildew in it.  She does do gardening with the flower garden she does use a steam iron but there is no mold or mildew in it.  There is no pet birds.  No feather pillow or duvet.  No pet gerbils.  No mold in the [facility] duct.  Does not play any wind instruments.  Does not use a humidifier.  Does not have a Jacuzzi in the house.   OCCUPATIONAL HISTORY (122 questions) : Essentially negative.  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   PULMONARY TOXICITY HISTORY (27 items): Negative except prednisone use    Results for CALA, KRUCKENBERG (MRN [id number]) as of 05/06/2019 12:01  Ref. Range 11/19/2018 but ? Done 02/28/2019 14:57  FVC-Pre Latest Units: L 2.29  FVC-%Pred-Pre Latest Units: % 70   Results for THAMARA, LEGER (MRN [id number]) as of 05/06/2019 12:01  Ref. Range 11/19/2018 but ? Done 02/28/2019 14:57  DLCO unc Latest Units: ml/min/mmHg 15.75  DLCO unc % pred Latest Units: % 77    She had a high-resolution CT chest September 24, 2018 at [facility]  This was read by our radiologist Dr Marolyn Jaksch..  I do not have the image to visualized but I trust this report.  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  In addition is described a 1.4 cm right upper lobe posterior segment inferior part nodule that is subpleural and also 8 mm subpleural right middle lobe nodule.  He says these findings are unchanged compared to 2008 and are benign.  OV 06/03/2019 -telephone visit.  Patient identified with 2 person identifier.  Risks, benefits and limitations of telephone visit explained.  Subjective:  Patient ID: Elveria DEL Talbot, female , DOB: 05/06/1950 , age 28 y.o. , MRN: [id number] , ADDRESS: [address] [place] [address]   Clinical IPF diagnosis given dec 2020 and reiterated Mid Feb 2021:  based on your age greater than 65, clubbing on exam, CT scan with high probability for this condition, negative history on your questionnaire and negative blood test on your serology exams   06/03/2019 -  Fu IPF - focus on esbriet  monitoring and any other questions she might have   HPI Elveria DEL Talbot 74 y.o. - on Esbriet  now. Started it 05/22/2019 Thursday. Currently on 2 pills tid. Taking it with food. Spacing 5-6h between dosing. No side effects at this point.  From a dyspnea standpoint she is stable.  She had questions about being able to do gardening.  We discussed the fact this could cause mold  exposure and organic dust exposure that could irritate her IPF.  She says she can do  it with masking.  Gardening gives her purpose so I supported it.  She also plan [place] last week and was quite short of breath and took an inhaler.  I have suggested that she use a pulse ox meter and monitor her pulse ox and call us  if her pulse ox below 88% so we can order portable oxygen  [obviously will have to do stress testing submaximal 6-minute walk test] otherwise doing well.    OV 12/16/2019   Subjective:  Patient ID: Elveria VEAR Spiller, female , DOB: [birth year], age 39 y.o. years. , MRN: [id number],  ADDRESS: [address] [place] [address] PCP  Kayla Drivers, MD Providers : Treatment Team:  Attending Provider: Geronimo Amel, MD   Chief Complaint  Patient presents with   Follow-up    Tired all the time. redness/itching to bilateral nick and insides of  arms.  stress/heat?     Clinical IPF diagnosis given dec 2020 and reiterated Mid Feb 2021:  based on your age greater than 65, clubbing on exam, CT scan with high probability for this condition, negative history on your questionnaire and negative blood test on your serology exams . - on Esbriet  now. Started it 05/22/2019   GERD/Hiatal Hernia  HPI JERMAINE THOLL 74 y.o. -returns for follow-up.  Last seen in early part of 2021.  After that she see nurse practitioner 2 times once or at least telephone visit.  She tried it in pulmonary rehabilitation for IPF but she had logistical issues.  Early morning she has to get her husband ready for work.  Late in the afternoon she has to pick up her granddaughter.  She also has knee issues from a fibromyalgia.  Therefore is not able to do pulmonary rehabilitation.  She is very compliant with her pirfenidone .  She feels she is tolerating it fine but she did admit to low appetite and also has a 7 pound weight loss.  She is also fatigued.  She thinks the fatigue is present for the last few months.  She  thinks it might be related to her daughter's wedding but then the fatigue has been there for the last few months while the daughter's wedding happened a few weeks ago.  She is not on oxygen .  Walking desaturation test is stable.  Most recent pulmonary function test is stable.  Symptom score other than fatigue is also stable.  She continues PPI for her acid reflux.      OV 05/06/2020  Subjective:  Patient ID: Elveria VEAR Spiller, female , DOB: [birth year] , age 63 y.o. , MRN: [id number] , ADDRESS: [address] [place] [address] PCP Kayla Drivers, MD Patient Care Team: Kayla Drivers, MD as PCP - General (Family Medicine) Charls Pearla LABOR, MD (Inactive) as PCP - Cardiology (Cardiology)  This Provider for this visit: Treatment Team:  Attending Provider: Geronimo Amel, MD    05/06/2020 -   Chief Complaint  Patient presents with   Follow-up    SOB with exertion    Clinical IPF diagnosis given dec 2020 and reiterated Mid Feb 2021:  based on your age greater than 65, clubbing on exam, CT scan with high probability for this condition, negative history on your questionnaire and negative blood test on your serology exams . - on Esbriet  now. Started it 05/22/2019   GERD/Hiatal Hernia  Last CT scan of the chest July 2020 at [facility] with probable UIP Last PFT June 2021  HPI UILANI SANVILLE 74 y.o. -presents for follow-up.  Last seen in September 2021.  Since then she continues to deal with her right knee issue.  She is seen Dr. Maya Nash for this.  Apparently a knee MRI is pending.  She is continuing to lose weight.  She has lost 10 pounds in the last 1 year.  This correlates with starting pirfenidone .  Technically she is still overweight.  She is also reporting associated fatigue and metallic taste.  A few months ago she made a call and we told her to hold the pirfenidone .  After that the metallic taste largely resolved but it does come back although to a much lesser severity.   At this point in time it does not bother her too much so she think she will continue to take pirfenidone .  From a respiratory standpoint she feels stable although she would say that in the last 1 year she feels the disease might be slowly progressive.  Her symptom score shows slight worsening especially with shortness of breath and weight loss but same fatigue.  Some nausea with pirfenidone .  He was supposed to pulmonary function testing today but for some reason has not happened.  Last PFT was June 2021.  Last CT scan of the chest was July 2020 I do not have echocardiogram in our system.  She did have a low risk nuclear medicine stress test with 59% in June 2020   OV 07/13/2020  Subjective:  Patient ID: Elveria VEAR Spiller, female , DOB: September 07, 1950 , age 36 y.o. , MRN: [id number] , ADDRESS: [address] [place] [address] PCP Dow Longs, PA-C Patient Care Team: Dow Longs, PA-C as PCP - General (Family Medicine) Charls Pearla LABOR, MD (Inactive) as PCP - Cardiology (Cardiology)  This Provider for this visit: Treatment Team:  Attending Provider: Geronimo Amel, MD    07/13/2020 -   Chief Complaint  Patient presents with   Follow-up    No concerns, here to get PFT results.    Clinical IPF diagnosis given dec 2020 and reiterated Mid Feb 2021:  based on your age greater than 65, clubbing on exam, CT scan with high probability for this condition, negative history on your questionnaire and negative blood test on serology exams . - Oon Esbriet  since 05/22/2019   GERD/Hiatal Hernia  Last PFT April 2022 Last CT scan of the chest March 2022   HPI GIOVANNI BATH 74 y.o. -returns for follow-up to discuss her test results.  She definitely has more shortness of breath now than she did in September 2021.  She continues to lose weight.  But her BMI is still 27.  No metallic taste no nausea vomiting or diarrhea.  No chest pains.  Her pulmonary function test shows slight decline in DLCO but  FVC is stable.  Walking desaturation to stable.  High-resolution CT chest shows stability in ILD compared to 2020.  Therefore overall she is probably stable.  There is evidence of coronary artery calcification and also aortic valve calcifications on the CT.  Her echo showed mild diastolic dysfunction that could be contributing to the dyspnea.  There is also mild aortic valve sclerosis.  There is no chest pain with exertion.  Her last cardiologist in [place] is no longer in the health system.  She is not seen a cardiologist in 7 years.  Does not recollect when her last stress test was.   WE discussed   1. Scientific Purpose  Clinical research is designed to produce generalizable knowledge and to answer questions about the  safety and efficacy of intervention(s) under study in order to determine whether or not they may be useful for the care of future patients.  2. Study Procedures  Participation in a trial may involve procedures or tests, in addition to the intervention(s) under study, that are intended only or primarily to generate scientific knowledge and that are otherwise not necessary for patient care.   3. Uncertainty  For intervention(s) under study in clinical research, there often is less knowledge and more uncertainty about the risks and benefits to a population of trial participants than there is when a doctor offers a patient standard interventions.   4. Adherence to Protocol  Administration of the intervention(s) under study is typically based on a strict protocol with defined dose, scheduling, and use or avoidance of concurrent medications, compared to administration of standard interventions.  5. Clinician as Investigator  Clinicians who are in health care settings provide treatment; in a clinical trial setting, they are also investigating safety and efficacy of an intervention. In otherwise your doctor or nurse practitioner can be wearing 2 hats - one as care giver another as  oceanographer  6. Patient as Engineer, Agricultural Subject  Patients participating in research trials are research subjects or volunteers. In other words participating in research is 100% voluntary and at one's own free weill. The decision to participate or not participate will NOT affect patient care and the doctor-patient relationship in any way   ECHO 06/29/20  IMPRESSIONS     1. Left ventricular ejection fraction, by estimation, is 60 to 65%. The  left ventricle has normal function. The left ventricle has no regional  wall motion abnormalities. There is mild left ventricular hypertrophy.  Left ventricular diastolic parameters  are consistent with Grade I diastolic dysfunction (impaired relaxation).   2. Right ventricular systolic function is normal. The right ventricular  size is normal. There is normal pulmonary artery systolic pressure. The  estimated right ventricular systolic pressure is 25.8 mmHg.   3. The mitral valve is grossly normal. Trivial mitral valve  regurgitation.   4. The aortic valve is tricuspid. Aortic valve regurgitation is not  visualized. Mild aortic valve sclerosis is present, with no evidence of  aortic valve stenosis.   5. The inferior vena cava is normal in size with greater than 50%  respiratory variability, suggesting right atrial pressure of 3 mmHg.   Comparison(s): No prior Echocardiogram.    CT Chest data march 77977  Narrative & Impression  CLINICAL DATA:  74 year old female with history of chronic dyspnea. Fibrotic changes noted on prior CT examination. Follow-up study.   EXAM: CT CHEST WITHOUT CONTRAST   TECHNIQUE: Multidetector CT imaging of the chest was performed following the standard protocol without intravenous contrast. High resolution imaging of the lungs, as well as inspiratory and expiratory imaging, was performed.   COMPARISON:  No priors available for comparison.   FINDINGS: Cardiovascular: Heart size is normal.  There is no significant pericardial fluid, thickening or pericardial calcification. There is aortic atherosclerosis, as well as atherosclerosis of the great vessels of the mediastinum and the coronary arteries, including calcified atherosclerotic plaque in the left main, left anterior descending, left circumflex and right coronary arteries. Calcifications of the aortic valve.   Mediastinum/Nodes: No pathologically enlarged mediastinal or hilar lymph nodes. Please note that accurate exclusion of hilar adenopathy is limited on noncontrast CT scans. Esophagus is unremarkable in appearance. No axillary lymphadenopathy.   Lungs/Pleura: High-resolution images demonstrate widespread areas of peripheral predominant ground-glass  attenuation, septal thickening, mild cylindrical bronchiectasis and peripheral bronchiolectasis. Findings have a mild craniocaudal gradient. No definitive honeycombing is identified. Inspiratory and expiratory imaging is unremarkable. No acute consolidative airspace disease. No pleural effusions. In the periphery of the right middle lobe (axial image 89 of series 6) there is a 7 x 5 mm (mean diameter of 6 mm) subpleural pulmonary nodule (stable compared to prior study 09/24/2018). In the superior segment of the right lower lobe (axial image 73 of series 6) there is a 2.3 x 0.8 cm elongated nodular area of architectural distortion which is intimately associated with an accessory fissure, which appears slightly larger on axial images, but is completely stable in size and appearance when viewed on sagittal reconstructions.   Upper Abdomen: Aortic atherosclerosis.   Musculoskeletal: There are no aggressive appearing lytic or blastic lesions noted in the visualized portions of the skeleton.   IMPRESSION: 1. 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 Repeat high-resolution chest CT is recommended in 12 months to assess for temporal changes in the appearance of the lung parenchyma. 2. Pulmonary nodules in the right lung are stable compared to the prior study from 2020, likely benign. Attention at time of repeat high-resolution chest CT is recommended to ensure continued stability. 3. Aortic atherosclerosis, in addition to left main and 3 vessel coronary artery disease. Please note that although the presence of coronary artery calcium documents the presence of coronary artery disease, the severity of this disease and any potential stenosis cannot be assessed on this non-gated CT examination. Assessment for potential risk factor modification, dietary therapy or pharmacologic therapy may be warranted, if clinically indicated. 4. There are calcifications of the aortic valve. Echocardiographic correlation for evaluation of potential valvular dysfunction may be warranted if clinically indicated.   Aortic Atherosclerosis (ICD10-I70.0).   Electronically Signed: By: Toribio Aye M.D. On: 06/01/2020 09:51  ADDENDUM REPORT: 06/21/2020 14:33   ADDENDUM: It was brought to my attention that there is a prior chest CT from 09/24/2018. After comparison between the 05/31/2020 exam and the 09/24/2018 exam, there is no evidence of progression. Assessment remains unchanged. Specifically, based on the spectrum of findings this continues to be categorized as probable usual interstitial pneumonia (UIP) per current ATS guidelines.     Electronically Signed   By: Toribio Aye M.D.   On: 06/21/2020 14:33        OV 01/24/2021  Subjective:  Patient ID: Elveria VEAR Spiller, female , DOB: Apr 12, 1950 , age 53 y.o. , MRN: [id number] , ADDRESS: [address] [place] [address] PCP Dow Longs, PA-C Patient Care Team: Dow Longs, PA-C as PCP - General (Family Medicine) Debera Jayson MATSU, MD as PCP - Cardiology  (Cardiology)  This Provider for this visit: Treatment Team:  Attending Provider: Geronimo Amel, MD    01/24/2021 -   Chief Complaint  Patient presents with   Follow-up    F/U after PFT. States her breathing has been stable since last visit with SOB with exertion.        HPI DORIS GRUHN 74 y.o. -returns for follow-up.  In this visit she has had pulmonary function test.  It shows a 7% decline in FVC and DLCO in 2 years.  Is roughly 3.5 %/year.  She is actually having more shortness of breath.  She did see cardiology and had a normal Myoview  stress test.  She  has been reassured from a cardiology standpoint.  But has dyspnea score is worse.  She is unable to do pulmonary rehabilitation because of issues with family and also because she has osteoarthritis of her knees.  She is seeing rheumatology for this.  She says she has never seen orthopedic for this.  Did indicate to her to talk to rheumatology and see if at this time to do a knee replacement and if she needs an orthopedic referral.  Did indicate that at some point the ILD could get worse and she would be quite dyspneic and unable to mobilize.  But at this point from a safety standpoint would be a good point in time to do a knee replacement if the knees are really bad.  She understood this.  We did discuss worsening.  She is on maximal standard of care.  We did discuss clinical trials as a care option she is interested.  She has been losing weight with pirfenidone  there is more weight loss.  Her BMI is 27 and technically she is still overweight.  We decided to continue with pirfenidone .  She will have a liver function test today.  She will have a high-dose flu shot today.     OV 08/18/2021  Subjective:  Patient ID: Elveria VEAR Spiller, female , DOB: 02-20-1951 , age 53 y.o. , MRN: [id number] , ADDRESS: [address] [place] [address] PCP Dow Longs, PA-C Patient Care Team: Dow Longs, PA-C as PCP - General (Family  Medicine) Debera Jayson MATSU, MD as PCP - Cardiology (Cardiology)  This Provider for this visit: Treatment Team:  Attending Provider: Geronimo Amel, MD    08/18/2021 -   Chief Complaint  Patient presents with   Follow-up    PFT performed today.  Pt states she has been doing okay since last visit. States she gets nauseous every time she takes her Esbriet  and also will become SOB with exertion.    Hyperlipidemia  HPI PAYAL STANFORTH 74 y.o. -returns for follow-up.  At this visit she continues to be stable in terms of previous weight loss but also dyspnea on exertion although this time she tells me that the dyspnea is bothering her though the symptom score is stable.  Her pulmonary function test shows overall slow decline over a long period of time but most recently the 2 studies are stable.  She appears to get dyspneic while walking the dog.  She is looking for measures to help her.  Did indicate to her about deconditioning.  She has previously been to pulmonary rehabilitation.  Her schedule a does not allow her to go to rehab.  We talked about doing a 6-minute walk test to look for exertional desaturation and seeing if she would qualify for oxygen .  She is willing to do this.    The more important thing is that she is having significant nausea.  In fact the nausea is worse now.  All of a sudden for the last few months.  She started taking over-the-counter Emetrol a few days ago and this seems to help.  She told me that she is spacing her pirfenidone  only every 4 hours.  This is what she thought she needed to do.  I did indicate to her that she needs to space it out at least 5 to 6 hours apart.  We will see if this helps the nausea.  She does drink enough fluids.  We went over her medication list.  This been significant changes to  her anticholesterol medications.  Since earlier this year she has been on Repatha.  Then a few days ago or a few weeks ago her Zetia  got changed back to fenofibrate .   The timing of this medication does not fit in with nausea.  Nevertheless she says she has hyperlipidemia for over 30 years.  Managed with primary care.  She says it is very difficult to control.  Never been seen by lipid specialist.  Willing to see Dr. Kenneth Chad Hilty.   Of note she has been interested in clinical trials but given the current nausea she wants to hold off at this point  CT Chest data   OV 02/28/2022  Subjective:  Patient ID: Elveria VEAR Spiller, female , DOB: [birth year] , age 8 y.o. , MRN: [id number] , ADDRESS: [address] [place] [address] PCP Dow Longs, PA-C Patient Care Team: Dow Longs, PA-C as PCP - General (Family Medicine) Debera Jayson MATSU, MD as PCP - Cardiology (Cardiology)  This Provider for this visit: Treatment Team:  Attending Provider: Geronimo Amel, MD    02/28/2022 -   Chief Complaint  Patient presents with   Follow-up    PFT performed today. Pt states she has been doing okay since last visit.     HPI JEMMIE LEDGERWOOD 74 y.o. -returns for follow-up.  Since last seeing me in summer 2023 she is stable.  She tells me that Thanksgiving 2023 she did have outpatient COVID-19.  Treated with Paxlovid.  4 days into this she was extremely tired and then suddenly passed out.  Since then she has been feeling well.  She felt it was because of dehydration and polypharmacy.  However from a respiratory standpoint and pirfenidone  tolerance standpoint she is doing really well.  No changes in symptoms.  Pulmonary function test is actually stable since the last 1 year [results below].  She had a normal echocardiogram in the summer 2023.  Results reviewed.  Her acid reflux under control.  In the past we have discussed clinical trials as a care option.  Today discussed the potential for osteoporosis risk with pulmonary fibrosis patients.  This study going on by [employer].  It involves getting a DEXA bone scan and she is willing to participate if she  qualifies.         OV 06/05/2022  Subjective:  Patient ID: Elveria VEAR Spiller, female , DOB: 06-10-1950 , age 32 y.o. , MRN: [id number] , ADDRESS: [address] [place] [address] PCP Dow Longs, PA-C Patient Care Team: Dow Longs, PA-C as PCP - General (Family Medicine) Debera Jayson MATSU, MD as PCP - Cardiology (Cardiology) Rogers Hai, MD as Medical Oncologist (Hematology)  This Provider for this visit: Treatment Team:  Attending Provider: Geronimo Amel, MD  06/05/2022 -   Chief Complaint  Patient presents with   Follow-up    Pt is here for follow up for IPF. Pt states she is doing well and still taking Esbriet . No side effects noted so far while on medication      HPI VALERIE CONES 74 y.o. -returns for follow-up.  Presents with her husband.  I am meeting the husband for the first time.  He did get dependent history forming the fact that she does get short of breath when she does things around the house and does some minimal yard work.  He does think it slightly progressive over the last 6-12 months.  Patient herself states that after the COVID-19 and Thanksgiving she injured her left  shoulder and then in the aftermath of that after Christmas 2023 she did develop a right-sided DVT and a pulmonary embolism with RV/LV ratio of 1.47 although BNP was normal at that time.  She was discharged slightly after New Year's 2024.  2D echocardiogram done towards the end of January 2024 does not show any right heart strain and it is baseline with grade 1 diastolic dysfunction.  She believes she is slightly more dyspneic than in the past after the pulmonary embolism.  However her shortness of breath scores remained stable and walking desaturation test is stable.  The pulmonary function test though shows a decline in DLCO subsequently [see below].  Her walking desaturation test is stable today.  Lab review also shows a slight amount of anemia in the hospital.  She had  high-resolution CT chest and according to the radiologist pulm fibrosis stable x 2 years.  She is tolerating the pirfenidone  well.  However she is continue to lose weight.  She is still overweight technically.    HRCT 05/24/22  Narrative & Impression  CLINICAL DATA:  74 year old female with history of interstitial lung disease.   EXAM: CT CHEST WITHOUT CONTRAST   TECHNIQUE: Multidetector CT imaging of the chest was performed following the standard protocol without intravenous contrast. High resolution imaging of the lungs, as well as inspiratory and expiratory imaging, was performed.   RADIATION DOSE REDUCTION: This exam was performed according to the departmental dose-optimization program which includes automated exposure control, adjustment of the mA and/or kV according to patient size and/or use of iterative reconstruction technique.   COMPARISON:  High-resolution chest CT 05/31/2020. Chest CTA 03/17/2022.   FINDINGS: Cardiovascular: Heart size is mildly enlarged. There is no significant pericardial fluid, thickening or pericardial calcification. There is aortic atherosclerosis, as well as atherosclerosis of the great vessels of the mediastinum and the coronary arteries, including calcified atherosclerotic plaque in the left main, left anterior descending, left circumflex and right coronary arteries. Calcifications of the aortic valve.   Mediastinum/Nodes: No pathologically enlarged mediastinal or hilar lymph nodes. Please note that accurate exclusion of hilar adenopathy is limited on noncontrast CT scans. Esophagus is unremarkable in appearance. No axillary lymphadenopathy.   Lungs/Pleura: High-resolution images again demonstrate widespread areas of peripheral predominant ground-glass attenuation, septal thickening, subpleural reticulation, thickening of the peribronchovascular interstitium, cylindrical bronchiectasis and peripheral bronchiolectasis scattered  throughout the lungs bilaterally. No definitive honeycombing confidently identified. No significant progression of disease compared to the prior study. Findings have a mild craniocaudal gradient. Inspiratory and expiratory imaging is unremarkable. No acute consolidative airspace disease. No pleural effusions. A few scattered tiny pulmonary nodules are noted, stable compared to the prior high-resolution chest CT 05/31/2020, largest of which measures only 3 mm in the medial right upper lobe (axial image 31 of series 5), considered definitively benign requiring no imaging follow-up. Small calcified granuloma also noted in the medial right upper lobe. No other larger more suspicious appearing pulmonary nodules or masses are noted.   Upper Abdomen: Aortic atherosclerosis.   Musculoskeletal: There are no aggressive appearing lytic or blastic lesions noted in the visualized portions of the skeleton.   IMPRESSION: 1. The appearance of the lungs remains compatible with interstitial lung disease, once again categorized as probable usual interstitial pneumonia (UIP) per current ATS guidelines. Given the lack of progression compared to prior examinations, the possibility of an alternative etiology such as fibrotic phase nonspecific interstitial pneumonia (NSIP) should be considered. 2. Mild cardiomegaly. 3. Aortic atherosclerosis, in addition to left main  and three-vessel coronary artery disease. Please note that although the presence of coronary artery calcium documents the presence of coronary artery disease, the severity of this disease and any potential stenosis cannot be assessed on this non-gated CT examination. Assessment for potential risk factor modification, dietary therapy or pharmacologic therapy may be warranted, if clinically indicated. 4. There are calcifications of the aortic valve. Echocardiographic correlation for evaluation of potential valvular dysfunction may be warranted  if clinically indicated.   Aortic Atherosclerosis (ICD10-I70.0).     Electronically Signed   By: Toribio Aye M.D.   On: 05/24/2022 13:25     OV 09/05/2022  Subjective:  Patient ID: Elveria VEAR Spiller, female , DOB: Apr 27, 1950 , age 77 y.o. , MRN: [id number] , ADDRESS: [address] [place] [address] PCP Dow Longs, PA-C Patient Care Team: Dow Longs, PA-C as PCP - General (Family Medicine) Debera Jayson MATSU, MD as PCP - Cardiology (Cardiology) Rogers Hai, MD as Medical Oncologist (Hematology)  This Provider for this visit: Treatment Team:  Attending Provider: Geronimo Amel, MD     09/05/2022 -   Chief Complaint  Patient presents with   Follow-up    Doing well.     HPI JILIAN WEST 74 y.o. -presents with her husband Garrel for follow-up.  Garrel is now retired.  He was the agricultural consultant for a shoe company called [employer].  He is now accompanying her with visits.  She reports overall being stable.  Dyspnea score is stable.  She is actually gained some weight because of increased sugar intake.  She continues with the pirfenidone .  It does give her some fatigue and nausea but she says it is well-controlled.  Was supposed to get pulmonary function test this week but unfortunately was done shortly after the last visit.  Disease appears stable between March and April 2024.  Currently the symptom score is stable.  The excess hypoxemia test results are stable.  She did get dyspneic doing exercise test.  We talked about pulm rehabilitation.  She did not attended previously because of scheduling convenience.  Decided to do another referral and referred to the rehab center at [facility].  She had a pulm embolism and right-sided DVT in December 2023: Will check a D-dimer now to decide on length of follow-up.  She is completed 6 months of treatment.  Suspect she will need to do 1 full year at full dose  Discussed clinical trials as a care option again.  She is  interested.  Her husband Garrel had some questions.  Gave her consent form for study involving [employer] v [employer].  We gave her the date on this.  The studies called beacon.  At some point we will prescreener.  OV 02/20/2023  Subjective:  Patient ID: Elveria VEAR Spiller, female , DOB: Oct 14, 1950 , age 53 y.o. , MRN: [id number] , ADDRESS: [address] [place] [address] PCP Dow Longs, PA-C Patient Care Team: Dow Longs, PA-C as PCP - General (Family Medicine) Debera Jayson MATSU, MD as PCP - Cardiology (Cardiology) Rogers Hai, MD as Medical Oncologist (Hematology) Geronimo Amel, MD as Consulting Physician (Pulmonary Disease)  This Provider for this visit: Treatment Team:  Attending Provider: Geronimo Amel, MD    02/20/2023 -   Chief Complaint  Patient presents with   Follow-up      HPI Elveria VEAR Luz 74 y.o. -returns for routine follow-up.  Since I last saw her in June 2024 she followed up with PA and hematology in  July 2024.  A CT angiogram chest was done and there was no pulmonary embolism.  A D-dimer at that time was normal based on lab review.  External record review indicates that because it was a provoked PE which is from COVID and immobility and these test being normal and coagulopathy testing being negative they stopped Eliquis .  I did discuss with the patient about getting a recheck D-dimer today and she is agreeable.  In terms of pirfenidone : She is tolerating this overall well except for the fact she has nausea intermittently she rated it is level 4 but she tells me its only intermittent and occasional and she is able to manage without any antinausea medications.  She also has altered taste and she is not able to taste the burger as well.  Despite this she does not want to change the pirfenidone  to nintedanib.  She had a liver function test just before Thanksgiving 2024 it is normal  In terms of pulmonary fibrosis and symptoms: She is more dyspneic  doing activities.  This is reflected in the symptom score [see below].  We did an exercise sit/stand hypoxemia test and the some exaggerated tendency to desaturate compared to the past.  I did indicate to her that very likely she is declining lung function and pulmonary fibrosis.  She is not interested in switching to nintedanib.  We discussed about adding clinical trial but she was interested at the last visit but she tells me she has daughter's wedding to plan for in the summer 2025 and she is just worried about living in [place] and commuting here for all the requirements of the test.  Did indicate that her time in travel are covered.  If needed taxi services can be provided.  However she feels that she wants to reflect on all this for the next couple of months and then decide.  Did indicate to her that at this point in time there is concern for lung function worsening.    Other issues - She did see Cheryl Birmingham 02/13/2019 for rheumatology.  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  Autoimmune profile was done this is normal. -She saw her cardiologist Dr. Sheppard Sierras 10/31/2022 note reviewed. -CT angiogram chest reported in July 2024 as disease without progression but given a contrast CT scan this is inaccurate.  I personally visualized that CT chest    OV 09/06/2023  Subjective:  Patient ID: Elveria VEAR Spiller, female , DOB: 1951-02-19 , age 54 y.o. , MRN: [id number] , ADDRESS: [address] [place] [address] PCP Dow Longs, PA-C Patient Care Team: Dow Longs, PA-C as PCP - General (Family Medicine) Sierras Jayson MATSU, MD as PCP - Cardiology (Cardiology) Rogers Hai, MD as Medical Oncologist (Hematology) Geronimo Amel, MD as Consulting Physician (Pulmonary Disease)  This Provider for this visit: Treatment Team:  Attending Provider: Geronimo Amel, MD    09/06/2023 -   Chief Complaint  Patient presents with   Follow-up    PFT repeated today. Breathing is unchanged since the last visit.    HPI LANDY MACE 75 y.o. -turns for follow-up.  She tolerates pirfenidone /Esbriet  quite well.  Respiratory status is stable.  Exercise hypoxemia test is stable.  She did pulmonary function test.  I think overall this is stable since March 2024 but slightly decline compared to September 2023.  She did do a D-dimer recently and this is normal.  BNP within the last 1 year is normal. Interim Health status: No new complaints No new medical problems.  No new surgeries. No ER visits. No Urgent care visits. No changes to medications   Social: Her youngest daughter got married at the [place] house in [place] and she was very excited this was last week.      OV 03/25/2024  Subjective:  Patient ID: Elveria VEAR Spiller, female , DOB: 01/03/1951 , age 18 y.o. , MRN: [id number] , ADDRESS: [address] [place] [address] PCP Dow Longs, PA-C Patient Care Team: Dow Longs, PA-C as PCP - General (Family Medicine) Debera Jayson MATSU, MD as PCP - Cardiology (Cardiology) Geronimo Amel, MD as Consulting Physician (Pulmonary Disease)  This Provider for this visit: Treatment Team:  Attending Provider: Geronimo Amel, MD    Clinical IPF diagnosis given dec 2020 and reiterated Mid Feb 2021:  based on your age greater than 65, clubbing on exam, CT scan with high probability for this condition, negative history on your questionnaire and negative blood test on serology exams .  - Oon Esbriet  since 05/22/2019 - Last PFT  April  2024 -> Nov 2024 - Last CT scan of the chest March 2024 -. CTA July 2024 without change   GERD/Hiatal Hernia   DJD/OA knee =-   Drug induced weihgt loss -due to pirfenidone   Grade 1 diastolic dysfunction with normal pulmonary artery pressure  - last echo Jan 2024   History of COVID-19 Thanksgiving 2023 treated with outpatient Paxlovid  -Fatigue related syncope along with polypharmacy   PE 03/17/22 - 03/20/22 admission with Rt DVT - Rx Eliquis  RV/LV ratio 1.47,  BMP{ 60  - ECHO Apr 13, 2022 - No RV strain and normal PAP. Gr 1 ddx  - Didimer normal FEb 2024. July 2024  - CTA July 2024 - No PE  -Stopped Eliquis  by [facility] PA and hematology July 2024. (Normal d dimer July 2024)  Lung nodules as of July 2024  = odular architectural distortion in the superior segment of the right lower lobe along the right major fissure measuring 1.5 x 1.0 cm is not significantly changed since 2022.   - . Unchanged 9 x 6 mm subpleural pulmonary nodule in the right middle lobe (series 6, image 79), benig - No further follow-up recommended.   Esbriet /Pirfenidone  requires intensive drug monitoring due to high concerns for Adverse effects of , including  Drug Induced Liver Injury, significant GI side effects that include but not limited to Diarrhea, Nausea, Vomiting,  and other system side effects that include Fatigue, headaches, weight loss and other side effects such as skin rash. These will be monitored with  blood work such as LFT initially once a month for 6 months and then quarterly   03/25/2024 -   Chief Complaint  Patient presents with   Medical Management of Chronic Issues   Interstitial Lung Disease    PFT repeated today. Breathing has been slightly worse over the past few months. She has occ cough with clear to yellow sputum. She had covid Sept 2025 and was prescribed paxlovid.      HPI LAVONNA LAMPRON 74 y.o. -MACKENIZE DELGADILLO is a 74 year old female with pulmonary fibrosis who presents with increased shortness of breath and chest heaviness.  She has experienced worsening shortness of breath over the past year, significantly impacting her ability to engage in activities she enjoys, such as baking. This symptom has progressively worsened over the past few months. No increased coughing or recent hospitalizations have been noted.  She describes a sensation of heaviness in her chest that  has been present for a few months, occurring both at rest  and with exertion, but worsening with activity. There is no associated pain radiating to her forearms. She also mentions a sensation akin to her heart racing, similar to the feeling of being 'huffing and puffing' after exertion, but denies experiencing palpitations or fluttering.  Her medical history includes pulmonary fibrosis and previous blood clots. She had a COVID-19 infection in September, which did not require hospitalization, though she had one urgent care visit related to this illness. She denies known atrial fibrillation and has not had a recent cardiac stress test.  Her current medications include Esbriet , Arnuity, and Prilosec, with no reported issues.        SYMPTOM SCALE - ILD 05/06/2019 179# 12/16/2019 172# - esbriet  + 05/06/2020 169# - esbriet  07/13/2020 167# - on esbreiet 01/24/2021 164# 08/18/2021 166# - esbriet  02/28/2022 162# 06/05/2022 160# 09/05/2022 167# 02/20/2023 170# 09/06/2023 165# - esbirelt 03/25/2024 165# - esbriet  Covid - opd Rx SEpt 2025  O2 use ra             Shortness of Breath 0 -> 5 scale with 5 being worst (score 6 If unable to do)             At rest 4 1 0 2 3 1 1 3 3 3  3 4  Simple tasks - showers, clothes change, eating, shaving 4 1 2 2 4 3 4 5 4 5 4 5   Household (dishes, doing bed, laundry) 5 5 4 4 5 5 5 4 5 5 4 5   Shopping 5 3 4 3 4 5 5 5 4 5 5 5   Walking level at own pace 5 3 4 3 4 5 5 3 4 5 5 5   Walking up Stairs 5 5 5 4 5 5 5 5 5 5 5 5   Total (30-36) Dyspnea Score 28 15 19 18 25 24 25 25 25 28 26 29   How bad is your cough? 2.5 1 0 0 0 2 3 2 3 2  0 3  How bad is your fatigue  yes 4 3 4 3 4 4 3 3 3 3 4   How bad is nausea x 0 2 0 2 4 1 1 3 4 0  3  How bad is vomiting?  x 0 0 0 0 0 0 0 0 0 0  0  How bad is diarrhea? x 0 0 0 0 0 0 0 0 0 0  0  How bad is anxiety? x 00 1 2 3 2 4 2 3 4 3  2  How bad is depression x 0 3 2 2 3 4 1 3 4 3  1         SIT STAND TEST - goal 15 times   09/06/2023  03/25/2024   O2 used ra ra  PRobe - finter or forehead  forehead finger  Number sit and stand completed - goal 15 15 10   Time taken to complete 45 sec 34 sec  Resting Pulse Ox/HR/Dyspnea  98% and 72/min and dyspnea of 3/10  98% and HR 84 and score 8  Peak measures 98 % and 90/min and dyspnea of 4.5/10 97% and HR 90 and score 97% a8  Final Pulse Ox/HR 95% and 80/min and dyspnea of 4/10 97% and score 8  Desaturated </= 88% no   Desaturated <= 3% points yes   Got Tachycardic >/= 90/min yes   Miscellaneous comments Moderte dyspnea Normal tes tiwht worsening dyspnea  PFT     Latest Ref Rng & Units 03/25/2024    1:33 PM 09/06/2023    1:32 PM 01/19/2023   12:48 PM 07/18/2022    3:04 PM 06/05/2022   11:46 AM 11/29/2021   11:32 AM 08/18/2021    9:16 AM  PFT Results  FVC-Pre L 2.05  P 1.96  2.03  2.10  2.09  2.10  2.14   FVC-Predicted Pre % 66  P 62  64  66  66  65  66   Pre FEV1/FVC % % 85  P 89  87  90  78  91  90   FEV1-Pre L 1.75  P 1.74  1.76  1.90  1.64  1.91  1.91   FEV1-Predicted Pre % 75  P 73  73  79  68  78  78   DLCO uncorrected ml/min/mmHg 13.33  P 12.77  10.69  11.75  11.10  14.80  15.98   DLCO UNC% % 66  P 63  52  58  54  72  78   DLCO corrected ml/min/mmHg 13.33  P 12.77  10.69  11.75  11.14  14.80  15.98   DLCO COR %Predicted % 66  P 63  52  58  55  72  78   DLVA Predicted % 114  P 110  93  96  94  113  109     P Preliminary result       LAB RESULTS last 96 hours No results found.       has a past medical history of Broken humerus (02/2022), Coronary artery calcification seen on CT scan, Essential hypertension, Fibromyalgia, Idiopathic pulmonary fibrosis (HCC), Mixed hyperlipidemia, Osteoporosis, and Type 2 diabetes mellitus (HCC).   reports that she has never smoked. She has been exposed to tobacco smoke. She has never used smokeless tobacco.  Past Surgical History:  Procedure Laterality Date   ABDOMINAL HYSTERECTOMY  2007   CATARACT EXTRACTION Bilateral     Allergies[1]  Immunization History  Administered  Date(s) Administered   Fluad Quad(high Dose 65+) 01/29/2019, 12/16/2019, 01/24/2021   INFLUENZA, HIGH DOSE SEASONAL PF 02/18/2024   Influenza Split 12/18/2017   Influenza-Unspecified 11/18/2013   PFIZER(Purple Top)SARS-COV-2 Vaccination 05/15/2019, 06/13/2019   PNEUMOCOCCAL CONJUGATE-20 03/20/2022, 02/28/2023   Pneumococcal Polysaccharide-23 01/20/2019   Tdap 05/04/1999   Zoster Recombinant(Shingrix) 03/15/2023, 05/16/2023    Family History  Problem Relation Age of Onset   Hypertension Mother    Diabetes Mother    Osteoporosis Mother    Hypertension Father    Heart disease Father    COPD Father    Colon cancer Sister    Colon cancer Brother    Heart disease Brother    Cancer Brother    Heart disease Brother    Emphysema Paternal Grandfather     Current Medications[2]      Objective:   Vitals:   03/25/24 1432  BP: 128/66  Pulse: 84  Temp: 97.7 F (36.5 C)  TempSrc: Oral  SpO2: 96%  Weight: 165 lb (74.8 kg)  Height: 5' 5 (1.651 m)    Estimated body mass index is 27.46 kg/m as calculated from the following:   Height as of this encounter: 5' 5 (1.651 m).   Weight as of this encounter: 165 lb (74.8 kg).  @WEIGHTCHANGE @  [employer]   03/25/24 1432  Weight: 165 lb (74.8 kg)     Physical Exam   General: No distress. Looks well O2 at rest:  no Cane present: no Sitting in wheel chair: no Frail: no Obese: no Neuro: Alert and Oriented x 3. GCS 15. Speech normal Psych: Pleasant Resp:  Barrel Chest - no.  Wheeze - no, Crackles - mild base, No overt respiratory distress CVS: Normal heart sounds. Murmurs - no Ext: Stigmata of Connective Tissue Disease - no HEENT: Normal upper airway. PEERL +. No post nasal drip        Assessment/     Assessment & Plan IPF (idiopathic pulmonary fibrosis) (HCC)  Encounter for therapeutic drug monitoring  DOE (dyspnea on exertion)  Chest heaviness    PLAN Patient Instructions  IPF (idiopathic pulmonary  fibrosis) (HCC) Medication monitoring encounter Gastroesophageal reflux disease, unspecified whether esophagitis present Dyspnea on exertion    - PFT stable but shortness of breath worse  Plan -Continue acid reflux medications -Continue pirfenidone  per schedule   -- Check LFT 03/25/2024 - Get HRCT next  few weeks     Pulmonary Embolism with Rt lower extremity DVT Dec 2023  - ? Due to covid as risk factor and shoulder injury - CTA negative July 2024  Dr  Lamon stopped Eliquis  - March 2025 - d-dimer NORMAL  Plan - recheck d-dimer given worsening shortness of breath  Chest Heaviness  Pllan - get echo  - refer cardiology Dr Debera   Worsening shortness of breath  plan - ge cbc - get d-dimer - get bnp = get echo - get HRCT   Followup -Return to see  DR Geronimo in 2-3 months  - 15 min visit but after finishing all tests    FOLLOWUP    Return for -Return to see  DR Geronimo in 2-3 months .    SIGNATURE    Dr. Dorethia Geronimo, M.D., F.C.C.P,  Pulmonary and Critical Care Medicine Staff Physician, [facility] Director - Interstitial Lung Disease  Program  Pulmonary Fibrosis [facility] at [facility] [place], [place], [address]  Pager: [phone], If no answer or between  15:00h - 7:00h: call [phone] Telephone: [phone]  3:01 PM 03/25/2024     [1]  Allergies Allergen Reactions   Latex    Pseudoephedrine Other (See Comments)    Rash and hallucinations    Sulfa Antibiotics    Sulfonamide Derivatives Rash  [2]  Current Outpatient Medications:    ACCU-CHEK AVIVA PLUS test strip, , Disp: , Rfl:    Accu-Chek Softclix Lancets lancets, , Disp: , Rfl:    albuterol  (VENTOLIN  HFA) 108 (90 Base) MCG/ACT inhaler, Inhale 2 puffs into the lungs every 4 (four) hours as needed for wheezing or shortness of breath., Disp: , Rfl:    ARNUITY ELLIPTA  100 MCG/ACT AEPB, INHALE 1 PUFF INTO THE LUNGS EVERY DAY,  Disp: 30 each, Rfl: 6   aspirin EC 81 MG tablet, Take 81 mg by mouth daily. Swallow whole., Disp: , Rfl:    Calcium Carbonate (CALTRATE 600 PO), Take 1 tablet by mouth 2 (two) times a day., Disp: , Rfl:    clonazePAM  (KLONOPIN ) 0.5 MG tablet, Take 0.5 mg by mouth at bedtime., Disp: , Rfl:    cyanocobalamin  (VITAMIN B12) 1000 MCG tablet, Take 1,000 mcg by mouth daily., Disp: , Rfl:    fenofibrate  (TRICOR ) 48 MG tablet, Take 48 mg by mouth at bedtime., Disp: , Rfl:    losartan  (COZAAR ) 50 MG tablet, Take 50 mg by mouth daily., Disp: , Rfl:    metFORMIN (GLUCOPHAGE-XR) 500  MG 24 hr tablet, Take 500 mg by mouth daily with breakfast. , Disp: , Rfl:    metoprolol  succinate (TOPROL -XL) 100 MG 24 hr tablet, Take 100 mg by mouth daily. Take with or immediately following a meal., Disp: , Rfl:    omeprazole  (PRILOSEC) 20 MG capsule, take 1 capsule (20 milligram total) by mouth daily., Disp: 30 capsule, Rfl: 0   Pirfenidone  (ESBRIET ) 801 MG TABS, Take 1 tablet (801 mg total) by mouth 3 (three) times daily with meals., Disp: 270 tablet, Rfl: 1   REPATHA SURECLICK 140 MG/ML SOAJ, Inject 1 mL into the skin every 14 (fourteen) days., Disp: , Rfl:    SAVELLA  50 MG TABS tablet, Take 50 mg by mouth 2 (two) times daily. , Disp: , Rfl:    spironolactone  (ALDACTONE ) 25 MG tablet, Take 0.5 tablets (12.5 mg total) by mouth daily., Disp: 45 tablet, Rfl: 1   tiZANidine  (ZANAFLEX ) 2 MG tablet, take (2) tablets every night at bedtime., Disp: 60 tablet, Rfl: 0   Azelastine HCl 137 MCG/SPRAY SOLN, , Disp: , Rfl:    fexofenadine (ALLEGRA) 180 MG tablet, Take 180 mg by mouth daily. (Patient not taking: Reported on 03/25/2024), Disp: , Rfl:   "

## 2024-03-25 NOTE — Patient Instructions (Signed)
 Spirometry and diffusion capacity performed today.

## 2024-03-25 NOTE — Patient Instructions (Addendum)
 IPF (idiopathic pulmonary fibrosis) (HCC) Medication monitoring encounter Gastroesophageal reflux disease, unspecified whether esophagitis present Dyspnea on exertion    - PFT stable but shortness of breath worse  Plan -Continue acid reflux medications -Continue pirfenidone  per schedule   -- Check LFT 03/25/2024 - Get HRCT next  few weeks     Pulmonary Embolism with Rt lower extremity DVT Dec 2023  - ? Due to covid as risk factor and shoulder injury - CTA negative July 2024  Dr  Lamon stopped Eliquis  - March 2025 - d-dimer NORMAL  Plan - recheck d-dimer given worsening shortness of breath  Chest Heaviness  Pllan - get echo  - refer cardiology Dr Debera   Worsening shortness of breath  plan - ge cbc - get d-dimer - get bnp = get echo - get HRCT   Followup -Return to see  DR Geronimo in 2-3 months  - 15 min visit but after finishing all tests

## 2024-03-26 ENCOUNTER — Ambulatory Visit: Admitting: Rheumatology

## 2024-03-26 ENCOUNTER — Ambulatory Visit: Payer: Self-pay | Admitting: Internal Medicine

## 2024-03-26 DIAGNOSIS — Z8639 Personal history of other endocrine, nutritional and metabolic disease: Secondary | ICD-10-CM

## 2024-03-26 DIAGNOSIS — M810 Age-related osteoporosis without current pathological fracture: Secondary | ICD-10-CM

## 2024-03-26 DIAGNOSIS — G4709 Other insomnia: Secondary | ICD-10-CM

## 2024-03-26 DIAGNOSIS — M170 Bilateral primary osteoarthritis of knee: Secondary | ICD-10-CM

## 2024-03-26 DIAGNOSIS — Z8679 Personal history of other diseases of the circulatory system: Secondary | ICD-10-CM

## 2024-03-26 DIAGNOSIS — Z8659 Personal history of other mental and behavioral disorders: Secondary | ICD-10-CM

## 2024-03-26 DIAGNOSIS — J849 Interstitial pulmonary disease, unspecified: Secondary | ICD-10-CM

## 2024-03-26 DIAGNOSIS — M5030 Other cervical disc degeneration, unspecified cervical region: Secondary | ICD-10-CM

## 2024-03-26 DIAGNOSIS — M4004 Postural kyphosis, thoracic region: Secondary | ICD-10-CM

## 2024-03-26 DIAGNOSIS — M19071 Primary osteoarthritis, right ankle and foot: Secondary | ICD-10-CM

## 2024-03-26 DIAGNOSIS — R5383 Other fatigue: Secondary | ICD-10-CM

## 2024-03-26 DIAGNOSIS — Z8261 Family history of arthritis: Secondary | ICD-10-CM

## 2024-03-26 DIAGNOSIS — M7701 Medial epicondylitis, right elbow: Secondary | ICD-10-CM

## 2024-03-26 DIAGNOSIS — Z8669 Personal history of other diseases of the nervous system and sense organs: Secondary | ICD-10-CM

## 2024-03-26 DIAGNOSIS — Z8719 Personal history of other diseases of the digestive system: Secondary | ICD-10-CM

## 2024-03-26 DIAGNOSIS — M51369 Other intervertebral disc degeneration, lumbar region without mention of lumbar back pain or lower extremity pain: Secondary | ICD-10-CM

## 2024-03-26 DIAGNOSIS — M797 Fibromyalgia: Secondary | ICD-10-CM

## 2024-03-26 DIAGNOSIS — M5134 Other intervertebral disc degeneration, thoracic region: Secondary | ICD-10-CM

## 2024-03-26 LAB — D-DIMER, QUANTITATIVE: D-Dimer, Quant: 0.49 ug{FEU}/mL

## 2024-03-26 NOTE — Progress Notes (Signed)
 D-dimer normal. BNP  - normal. No anemia . So  no anemia or blood clot potentia or heart issues to explain high shortrnsss of breath. Await other tests

## 2024-04-07 ENCOUNTER — Other Ambulatory Visit: Payer: Self-pay | Admitting: Internal Medicine

## 2024-04-07 DIAGNOSIS — R0609 Other forms of dyspnea: Secondary | ICD-10-CM

## 2024-04-07 DIAGNOSIS — K219 Gastro-esophageal reflux disease without esophagitis: Secondary | ICD-10-CM

## 2024-04-07 DIAGNOSIS — J849 Interstitial pulmonary disease, unspecified: Secondary | ICD-10-CM

## 2024-04-08 ENCOUNTER — Encounter: Payer: Self-pay | Admitting: *Deleted

## 2024-04-08 ENCOUNTER — Encounter: Payer: Self-pay | Admitting: Cardiology

## 2024-04-08 ENCOUNTER — Ambulatory Visit: Attending: Cardiology | Admitting: Cardiology

## 2024-04-08 VITALS — BP 132/70 | HR 82 | Ht 65.0 in | Wt 162.6 lb

## 2024-04-08 DIAGNOSIS — I2510 Atherosclerotic heart disease of native coronary artery without angina pectoris: Secondary | ICD-10-CM

## 2024-04-08 DIAGNOSIS — I10 Essential (primary) hypertension: Secondary | ICD-10-CM | POA: Diagnosis not present

## 2024-04-08 DIAGNOSIS — J84112 Idiopathic pulmonary fibrosis: Secondary | ICD-10-CM

## 2024-04-08 DIAGNOSIS — E782 Mixed hyperlipidemia: Secondary | ICD-10-CM | POA: Diagnosis not present

## 2024-04-08 DIAGNOSIS — R072 Precordial pain: Secondary | ICD-10-CM | POA: Diagnosis not present

## 2024-04-08 NOTE — Progress Notes (Signed)
"  ° ° °  Cardiology Office Note  Date: 04/08/2024   ID: Nichole Reyes, DOB 1950-10-15, MRN [id number]  History of Present Illness: Nichole Reyes is a 74 y.o. female last seen in April 2025.  She is here for a follow-up visit with her husband.  I reviewed the recent office note by Dr. Geronimo.  She reports chronic dyspnea on exertion, NYHA class II-III.  Also feeling intermittent chest heaviness, sometimes with exertion, sometimes at rest, not necessarily on a regular basis.  This has been going on for period of months.  No recent fevers or chills, no progressive cough.  I reviewed her medications which are stable..  I rechecked her blood pressure today at 132/70.  Also reviewed recent lab work.  She has been scheduled for an echocardiogram and hide resolution chest CT by Dr. Geronimo.  Her last ischemic evaluation was in 2022.  Physical Exam: VS:  BP 132/70 (BP Location: Left Arm)   Pulse 82   Ht 5' 5 (1.651 m)   Wt 162 lb 9.6 oz (73.8 kg)   SpO2 98%   BMI 27.06 kg/m , BMI Body mass index is 27.06 kg/m.  Wt Readings from Last 3 Encounters:  04/08/24 162 lb 9.6 oz (73.8 kg)  03/25/24 165 lb (74.8 kg)  09/18/23 165 lb 12.8 oz (75.2 kg)    General: Patient appears comfortable at rest.  Hoarseness to voice. HEENT: Conjunctiva and lids normal. Neck: Supple, no elevated JVP or carotid bruits. Lungs: No crackles or wheezing. Cardiac: Regular rate and rhythm, no S3 or significant systolic murmur. Extremities: No pitting edema.  ECG:  An ECG dated 07/13/2023 was personally reviewed today and demonstrated:  Sinus tachycardia with left anterior fascicular block and increased voltage.  Labwork: 03/25/2024: ALT 10; AST 16; BUN 15; Creatinine, Ser 1.02; Hemoglobin 14.0; Platelets 453.0; Potassium 4.7; Pro B Natriuretic peptide (BNP) 53.0; Sodium 140   Other Studies Reviewed Today:  No interval cardiac testing for review today.  Assessment and Plan:  1.  Coronary artery calcification  and aortic atherosclerosis evident by CT imaging.  Lexiscan  Myoview  in July 2022 was negative for ischemia with LVEF 68%.  She has chronic dyspnea on exertion in the setting of known IPF, also reporting chest heaviness intermittently over the last few months.  She has been scheduled to undergo an echocardiogram and high-resolution chest CT by Dr. Geronimo.  We will follow-up with a repeat Lexiscan  Myoview  to reassess for ischemia.   2.  Mixed hyperlipidemia with statin myalgias.  Requesting recent lipids from [facility].  Continue Repatha 140 mg/mL every 14 days.   3.  Idiopathic pulmonary fibrosis followed by Pulmonary.  She continues on Esbriet  and follows with Dr. Geronimo.   4.  Primary hypertension.  Continue with current regimen including Aldactone  12.5 mg daily, Toprol -XL 100 mg daily, and Cozaar  50 mg daily.  Disposition:  Follow up test results.  Signed, Jayson JUDITHANN Sierras, M.D., F.A.C.C. [facility] at [facility]

## 2024-04-08 NOTE — Patient Instructions (Addendum)
Medication Instructions:  ?Your physician recommends that you continue on your current medications as directed. Please refer to the Current Medication list given to you today. ? ?Labwork: ?none ? ?Testing/Procedures: ?Your physician has requested that you have a lexiscan myoview. For further information please visit [email or web address]. Please follow instruction sheet, as given. ? ?Follow-Up: ?Your physician recommends that you schedule a follow-up appointment in: pending ? ?Any Other Special Instructions Will Be Listed Below (If Applicable). ? ?If you need a refill on your cardiac medications before your next appointment, please call your pharmacy. ?

## 2024-04-09 ENCOUNTER — Encounter: Payer: Self-pay | Admitting: Family Medicine

## 2024-04-15 ENCOUNTER — Inpatient Hospital Stay (HOSPITAL_COMMUNITY): Admission: RE | Admit: 2024-04-15 | Source: Ambulatory Visit

## 2024-04-15 ENCOUNTER — Ambulatory Visit (HOSPITAL_COMMUNITY): Admission: RE | Admit: 2024-04-15

## 2024-04-16 ENCOUNTER — Ambulatory Visit: Attending: Internal Medicine

## 2024-04-16 ENCOUNTER — Other Ambulatory Visit: Payer: Self-pay

## 2024-04-16 ENCOUNTER — Ambulatory Visit (HOSPITAL_COMMUNITY)
Admission: RE | Admit: 2024-04-16 | Discharge: 2024-04-16 | Disposition: A | Discharge status: Home / Self Care | Source: Ambulatory Visit | Attending: Internal Medicine | Admitting: Internal Medicine

## 2024-04-16 DIAGNOSIS — R0609 Other forms of dyspnea: Secondary | ICD-10-CM | POA: Insufficient documentation

## 2024-04-16 DIAGNOSIS — Z79899 Other long term (current) drug therapy: Secondary | ICD-10-CM

## 2024-04-16 DIAGNOSIS — R0789 Other chest pain: Secondary | ICD-10-CM | POA: Insufficient documentation

## 2024-04-16 DIAGNOSIS — Z5181 Encounter for therapeutic drug level monitoring: Secondary | ICD-10-CM | POA: Diagnosis present

## 2024-04-16 DIAGNOSIS — J84112 Idiopathic pulmonary fibrosis: Secondary | ICD-10-CM

## 2024-04-16 MED ORDER — PIRFENIDONE 801 MG PO TABS
801.0000 mg | ORAL_TABLET | Freq: Three times a day (TID) | ORAL | 0 refills | Status: AC
  Filled 2024-04-16 – 2024-04-24 (×2): qty 90, 30d supply, fill #0

## 2024-04-16 NOTE — Addendum Note (Signed)
 Addended by: SHAREN DELON HERO on: 04/16/2024 03:30 PM   Modules accepted: Orders

## 2024-04-16 NOTE — Progress Notes (Signed)
 [facility]  Referring Provider: Dorethia Cave  Virtual Visit via Telephone Note  I connected with Nichole Reyes on 04/16/24 at  3:30 PM EST by telephone and verified that I am speaking with the correct person using two identifiers.  Location: Patient: home Provider: office   I discussed the limitations, risks, security and privacy concerns of performing an evaluation and management service by telephone and the availability of in person appointments. I also discussed with the patient that there may be a patient responsible charge related to this service. The patient expressed understanding and agreed to proceed.   HPI: Nichole Reyes is a 74 y.o. female who presents to the pharmacotherapy clinic via telephone for continuation of therapy with Pirfenidone  for Idiopathic Pulmonary Fibrosis.  Patient Active Problem List   Diagnosis Date Noted   Closed fracture of proximal end of left humerus with routine healing 05/19/2022   DVT (deep venous thrombosis) (HCC) 04/21/2022   Acute pulmonary embolism (HCC) 03/17/2022   Type 2 diabetes mellitus (HCC) 03/17/2022   Essential hypertension 03/17/2022   Coronary artery calcification seen on CT scan 03/17/2022   IPF (idiopathic pulmonary fibrosis) (HCC) 09/29/2021   DOE (dyspnea on exertion) 09/29/2021   ILD (interstitial lung disease) (HCC) 08/26/2019   Fibromyalgia syndrome 06/26/2016   Other fatigue 06/26/2016   Other insomnia 06/26/2016   Arthralgia of both knees 06/26/2016   Pain in both feet 06/26/2016   Primary osteoarthritis of both feet 06/26/2016   Primary osteoarthritis of both knees 06/26/2016   DDD (degenerative disc disease), cervical 06/26/2016   DDD (degenerative disc disease), thoracic 06/26/2016   DDD (degenerative disc disease), lumbar 06/26/2016   History of hypertension 06/26/2016   History of high cholesterol 06/26/2016   Other sleep apnea 06/26/2016   History of osteopenia 06/26/2016    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   ARNUITY ELLIPTA  100 MCG/ACT AEPB    INHALE 1 PUFF INTO THE LUNGS EVERY DAY   ASPIRIN EC 81 MG TABLET    Take 81 mg by mouth daily. Swallow whole.   AZELASTINE HCL 137 MCG/SPRAY SOLN       CALCIUM CARBONATE (CALTRATE 600 PO)    Take 1 tablet by mouth 2 (two) times a day.   CLONAZEPAM  (KLONOPIN ) 0.5 MG TABLET    Take 0.5 mg by mouth at bedtime.   CYANOCOBALAMIN  (VITAMIN B12) 1000 MCG TABLET    Take 1,000 mcg by mouth daily.   FENOFIBRATE  (TRICOR ) 48 MG TABLET    Take 48 mg by mouth at bedtime.   FEXOFENADINE (ALLEGRA) 180 MG TABLET    Take 180 mg by mouth daily.   JUBBONTI 60 MG/ML SOSY INJECTION    Inject 60 mg into the skin every 6 (six) months.   LOSARTAN  (COZAAR ) 50 MG TABLET    Take 50 mg by mouth daily.   METFORMIN (GLUCOPHAGE-XR) 500 MG 24 HR  TABLET    Take 500 mg by mouth daily with breakfast.    METOPROLOL  SUCCINATE (TOPROL -XL) 100 MG 24 HR TABLET    Take 100 mg by mouth daily. Take with or immediately following a meal.   OMEPRAZOLE  (PRILOSEC) 20 MG CAPSULE    take 1 capsule (20 milligram total) by mouth daily.   REPATHA SURECLICK 140 MG/ML SOAJ    Inject 1 mL into the skin every 14 (fourteen) days.   SAVELLA  50 MG TABS TABLET    Take 50 mg by mouth 2 (two) times daily.    SPIRONOLACTONE  (ALDACTONE ) 25 MG TABLET    Take 0.5 tablets (12.5 mg total) by mouth daily.   TIZANIDINE  (ZANAFLEX ) 2 MG TABLET    take  (2) tablets every night at bedtime.  Modified Medications   Modified Medication Previous Medication   PIRFENIDONE  (ESBRIET ) 801 MG TABS Pirfenidone  (ESBRIET ) 801 MG TABS      Take 1 tablet (801 mg total) by mouth 3 (three) times daily with meals.    Take 1 tablet (801 mg total) by mouth 3 (three) times daily with meals.  Discontinued Medications   No medications on file    Allergies: Allergies[1]  Past Medical History: Past Medical History:  Diagnosis Date   Broken humerus 02/2022   right   Coronary artery calcification seen on CT scan    Essential hypertension    Fibromyalgia    Idiopathic pulmonary fibrosis (HCC)    Mixed hyperlipidemia    Osteoporosis    Type 2 diabetes mellitus (HCC)     Social History: Social History   Socioeconomic History   Marital status: Married    Spouse name: Not on file   Number of children: Not on file   Years of education: Not on file   Highest education level: Not on file  Occupational History   Not on file  Tobacco Use   Smoking status: Never    Passive exposure: Past   Smokeless tobacco: Never  Vaping Use   Vaping status: Never Used  Substance and Sexual Activity   Alcohol use: Not Currently    Comment: ocassional   Drug use: No   Sexual activity: Not on file  Other Topics Concern   Not on file  Social History Narrative   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   Social Drivers of Health   Tobacco Use: Low Risk (04/08/2024)   Patient History    Smoking Tobacco Use: Never    Smokeless Tobacco Use: Never    Passive Exposure: Past  Financial Resource Strain: Not on file  Food Insecurity: No Food Insecurity (05/05/2022)   Hunger Vital Sign    Worried About Running Out of Food in the Last Year: Never true    Ran Out of Food in the Last Year: Never true  Transportation Needs: No Transportation Needs (05/05/2022)   PRAPARE - Administrator, Civil Service (Medical): No    Lack of Transportation  (Non-Medical): No  Physical Activity: Not on file  Stress: Not on file  Social Connections: Not on file  Depression (PHQ2-9): Low Risk (05/05/2022)   Depression (PHQ2-9)    PHQ-2 Score: 0  Alcohol Screen: Not on file  Housing: Low Risk (05/05/2022)   Housing    Last Housing Risk Score: 0  Utilities: Not At Risk (05/05/2022)   AHC Utilities    Threatened with loss of utilities: No  Health Literacy: Not on file  Medication: Pirfenidone  801mg  three times daily  Assessment and Plan  Esbriet  Medication Management Thoroughly counseled patient on the efficacy, mechanism of action, dosing, administration, adverse effects, and monitoring parameters of Esbriet .  Patient verbalized understanding.   Goals of Therapy: Will not stop or reverse the progression of ILD. It will slow the progression of ILD.   Dosing: Starting dose will be Esbriet  267 mg 1 tablet three times daily for 7 days, then 2 tablets three times daily for 7 days, then 3 tablets three times daily.  Maintenance dose will be 801 mg 1 tablet three times daily if tolerated.  Stressed the importance of taking with meals and space at least 5-6 hours apart to minimize stomach upset.   Adverse Effects: Nausea, vomiting, diarrhea, weight loss Abdominal pain GERD Sun sensitivity/rash - patient advised to wear sunscreen when exposed to sunlight Dizziness Fatigue  Monitoring: Monitor for diarrhea, nausea and vomiting, GI perforation, hepatotoxicity  Monitor LFTs - baseline, monthly for first 6 months, then every 3 months routinely Pregnancy test - baseline prn CBC w differential at baseline and every 3 months routinely  Medication Reconciliation A drug regimen assessment was performed, including review of allergies, interactions, disease-state management, dosing and immunization history. Medications were reviewed with the patient, including name, instructions, indication, goals of therapy, potential side effects, importance of  adherence, and safe use.   I discussed the assessment and treatment plan with the patient. The patient was provided an opportunity to ask questions and all were answered. The patient agreed with the plan and demonstrated an understanding of the instructions.   The patient was advised to call back or seek an in-person evaluation if the symptoms worsen or if the condition fails to improve as anticipated.  I provided 15 minutes of non-face-to-face time during this encounter.  Delon Brow, PharmD, CSP, AAHIVP, CPP Clinical Pharmacist Practitioner - Medication Therapy Disease Management/Specialty Pharmacy Services 04/16/2024, 3:44 PM      [1]  Allergies Allergen Reactions   Latex    Pseudoephedrine Other (See Comments)    Rash and hallucinations    Sulfa Antibiotics    Sulfonamide Derivatives Rash

## 2024-04-17 NOTE — Progress Notes (Signed)
 Nichole Reyes                                          MRN: [id number]   04/17/2024   The [facility] Quality Team Specialist reviewed this patient medical record for the purposes of chart review for care gap closure. The following were reviewed: chart review for care gap closure-glycemic status assessment.    [facility]I Quality Team

## 2024-04-18 ENCOUNTER — Other Ambulatory Visit (HOSPITAL_COMMUNITY): Payer: Self-pay

## 2024-04-18 ENCOUNTER — Other Ambulatory Visit: Payer: Self-pay | Admitting: Physician Assistant

## 2024-04-18 ENCOUNTER — Telehealth: Payer: Self-pay

## 2024-04-18 NOTE — Telephone Encounter (Signed)
 Last Fill: 03/10/2024  Next Visit: 05/28/2024  Last Visit: 09/18/2023  DX: Fibromyalgia:   Current Dose per office note on 09/18/2023: She has been taking tizanidine  2 mg at bedtime for muscle spasms.   Okay to refill tizandine ?

## 2024-04-18 NOTE — Telephone Encounter (Signed)
 Received notification via specialty pharmacy encounter that pt's copay for pirfenidone  is $172.60. Pt enrolled in Pulmonary Fibrosis grant through PA[employer]:  Amount: $5000 Award Period: 04/03/24 - 04/18/25 BIN: [id number] PCN: [id number] Group: [id number] ID: [id number]  For pharmacy inquiries, contact PDMI at [phone]. For patient inquiries, contact PA[employer] at [phone].

## 2024-04-24 ENCOUNTER — Other Ambulatory Visit (HOSPITAL_COMMUNITY): Payer: Self-pay

## 2024-04-24 ENCOUNTER — Other Ambulatory Visit: Payer: Self-pay

## 2024-04-28 ENCOUNTER — Ambulatory Visit (HOSPITAL_COMMUNITY)

## 2024-05-27 ENCOUNTER — Ambulatory Visit: Admitting: Internal Medicine

## 2024-05-28 ENCOUNTER — Ambulatory Visit: Admitting: Rheumatology
